# Patient Record
Sex: Male | Born: 1945 | Race: White | Hispanic: No | Marital: Married | Smoking: Never smoker
Health system: Southern US, Community
[De-identification: ages and names within clinical notes are randomized; demographics above are authoritative.]

## PROBLEM LIST (undated history)

## (undated) DIAGNOSIS — Z85828 Personal history of other malignant neoplasm of skin: Secondary | ICD-10-CM

## (undated) DIAGNOSIS — G4730 Sleep apnea, unspecified: Secondary | ICD-10-CM

## (undated) DIAGNOSIS — I82409 Acute embolism and thrombosis of unspecified deep veins of unspecified lower extremity: Secondary | ICD-10-CM

## (undated) DIAGNOSIS — E119 Type 2 diabetes mellitus without complications: Secondary | ICD-10-CM

## (undated) DIAGNOSIS — D689 Coagulation defect, unspecified: Secondary | ICD-10-CM

## (undated) DIAGNOSIS — N189 Chronic kidney disease, unspecified: Secondary | ICD-10-CM

## (undated) DIAGNOSIS — M1990 Unspecified osteoarthritis, unspecified site: Secondary | ICD-10-CM

## (undated) DIAGNOSIS — R0609 Other forms of dyspnea: Secondary | ICD-10-CM

## (undated) DIAGNOSIS — R0600 Dyspnea, unspecified: Secondary | ICD-10-CM

## (undated) DIAGNOSIS — N401 Enlarged prostate with lower urinary tract symptoms: Secondary | ICD-10-CM

## (undated) DIAGNOSIS — Z9889 Other specified postprocedural states: Secondary | ICD-10-CM

## (undated) DIAGNOSIS — Z973 Presence of spectacles and contact lenses: Secondary | ICD-10-CM

## (undated) DIAGNOSIS — Z86718 Personal history of other venous thrombosis and embolism: Secondary | ICD-10-CM

## (undated) DIAGNOSIS — I2510 Atherosclerotic heart disease of native coronary artery without angina pectoris: Secondary | ICD-10-CM

## (undated) DIAGNOSIS — I639 Cerebral infarction, unspecified: Secondary | ICD-10-CM

## (undated) DIAGNOSIS — S0081XA Abrasion of other part of head, initial encounter: Secondary | ICD-10-CM

## (undated) DIAGNOSIS — G629 Polyneuropathy, unspecified: Secondary | ICD-10-CM

## (undated) DIAGNOSIS — E785 Hyperlipidemia, unspecified: Secondary | ICD-10-CM

## (undated) DIAGNOSIS — L298 Other pruritus: Secondary | ICD-10-CM

## (undated) DIAGNOSIS — Z87442 Personal history of urinary calculi: Secondary | ICD-10-CM

## (undated) DIAGNOSIS — E7800 Pure hypercholesterolemia, unspecified: Secondary | ICD-10-CM

## (undated) DIAGNOSIS — M1A9XX0 Chronic gout, unspecified, without tophus (tophi): Secondary | ICD-10-CM

## (undated) DIAGNOSIS — K5730 Diverticulosis of large intestine without perforation or abscess without bleeding: Secondary | ICD-10-CM

## (undated) DIAGNOSIS — I10 Essential (primary) hypertension: Secondary | ICD-10-CM

## (undated) DIAGNOSIS — N289 Disorder of kidney and ureter, unspecified: Secondary | ICD-10-CM

## (undated) DIAGNOSIS — T603X1A Toxic effect of herbicides and fungicides, accidental (unintentional), initial encounter: Secondary | ICD-10-CM

## (undated) DIAGNOSIS — K5790 Diverticulosis of intestine, part unspecified, without perforation or abscess without bleeding: Secondary | ICD-10-CM

## (undated) DIAGNOSIS — K5909 Other constipation: Secondary | ICD-10-CM

## (undated) DIAGNOSIS — D631 Anemia in chronic kidney disease: Secondary | ICD-10-CM

## (undated) DIAGNOSIS — Z794 Long term (current) use of insulin: Secondary | ICD-10-CM

## (undated) DIAGNOSIS — C61 Malignant neoplasm of prostate: Secondary | ICD-10-CM

## (undated) DIAGNOSIS — C4491 Basal cell carcinoma of skin, unspecified: Secondary | ICD-10-CM

## (undated) DIAGNOSIS — F419 Anxiety disorder, unspecified: Secondary | ICD-10-CM

## (undated) DIAGNOSIS — H269 Unspecified cataract: Secondary | ICD-10-CM

## (undated) DIAGNOSIS — Z974 Presence of external hearing-aid: Secondary | ICD-10-CM

## (undated) DIAGNOSIS — N200 Calculus of kidney: Secondary | ICD-10-CM

## (undated) DIAGNOSIS — N183 Chronic kidney disease, stage 3 unspecified: Secondary | ICD-10-CM

## (undated) DIAGNOSIS — L2989 Other pruritus: Secondary | ICD-10-CM

## (undated) DIAGNOSIS — G4733 Obstructive sleep apnea (adult) (pediatric): Secondary | ICD-10-CM

## (undated) DIAGNOSIS — Z8673 Personal history of transient ischemic attack (TIA), and cerebral infarction without residual deficits: Secondary | ICD-10-CM

## (undated) DIAGNOSIS — Z77098 Contact with and (suspected) exposure to other hazardous, chiefly nonmedicinal, chemicals: Secondary | ICD-10-CM

## (undated) DIAGNOSIS — D649 Anemia, unspecified: Secondary | ICD-10-CM

## (undated) HISTORY — PX: APPENDECTOMY: SHX54

## (undated) HISTORY — DX: Personal history of other malignant neoplasm of skin: Z85.828

## (undated) HISTORY — DX: Chronic kidney disease, unspecified: N18.9

## (undated) HISTORY — DX: Pure hypercholesterolemia, unspecified: E78.00

## (undated) HISTORY — DX: Unspecified cataract: H26.9

## (undated) HISTORY — DX: Diverticulosis of intestine, part unspecified, without perforation or abscess without bleeding: K57.90

## (undated) HISTORY — DX: Anemia, unspecified: D64.9

## (undated) HISTORY — DX: Basal cell carcinoma of skin, unspecified: C44.91

## (undated) HISTORY — DX: Atherosclerotic heart disease of native coronary artery without angina pectoris: I25.10

## (undated) HISTORY — DX: Cerebral infarction, unspecified: I63.9

## (undated) HISTORY — DX: Sleep apnea, unspecified: G47.30

## (undated) HISTORY — DX: Unspecified osteoarthritis, unspecified site: M19.90

## (undated) HISTORY — PX: OTHER SURGICAL HISTORY: SHX169

## (undated) HISTORY — PX: TOTAL HIP ARTHROPLASTY: SHX124

## (undated) HISTORY — PX: MOHS SURGERY: SUR867

## (undated) HISTORY — DX: Calculus of kidney: N20.0

## (undated) HISTORY — DX: Anxiety disorder, unspecified: F41.9

## (undated) HISTORY — DX: Coagulation defect, unspecified: D68.9

## (undated) HISTORY — DX: Acute embolism and thrombosis of unspecified deep veins of unspecified lower extremity: I82.409

## (undated) HISTORY — DX: Disorder of kidney and ureter, unspecified: N28.9

## (undated) HISTORY — DX: Hyperlipidemia, unspecified: E78.5

## (undated) HISTORY — DX: Essential (primary) hypertension: I10

---

## 2000-11-19 HISTORY — PX: CYSTOSCOPY/RETROGRADE/URETEROSCOPY/STONE EXTRACTION WITH BASKET: SHX5317

## 2003-06-24 ENCOUNTER — Encounter: Payer: Self-pay | Admitting: Family Medicine

## 2003-06-24 ENCOUNTER — Encounter: Admission: RE | Admit: 2003-06-24 | Discharge: 2003-06-24 | Payer: Self-pay | Admitting: Family Medicine

## 2003-08-31 ENCOUNTER — Ambulatory Visit (HOSPITAL_COMMUNITY): Admission: RE | Admit: 2003-08-31 | Discharge: 2003-08-31 | Payer: Self-pay | Admitting: Gastroenterology

## 2003-10-26 ENCOUNTER — Inpatient Hospital Stay (HOSPITAL_COMMUNITY): Admission: RE | Admit: 2003-10-26 | Discharge: 2003-10-30 | Payer: Self-pay | Admitting: Orthopedic Surgery

## 2003-11-04 ENCOUNTER — Ambulatory Visit (HOSPITAL_COMMUNITY): Admission: RE | Admit: 2003-11-04 | Discharge: 2003-11-04 | Payer: Self-pay | Admitting: Orthopedic Surgery

## 2004-08-11 ENCOUNTER — Ambulatory Visit (HOSPITAL_COMMUNITY): Admission: RE | Admit: 2004-08-11 | Discharge: 2004-08-11 | Payer: Self-pay | Admitting: Oncology

## 2004-08-11 ENCOUNTER — Encounter (INDEPENDENT_AMBULATORY_CARE_PROVIDER_SITE_OTHER): Payer: Self-pay | Admitting: Specialist

## 2004-08-18 ENCOUNTER — Ambulatory Visit (HOSPITAL_COMMUNITY): Admission: RE | Admit: 2004-08-18 | Discharge: 2004-08-18 | Payer: Self-pay | Admitting: Oncology

## 2004-09-06 ENCOUNTER — Ambulatory Visit (HOSPITAL_COMMUNITY): Admission: RE | Admit: 2004-09-06 | Discharge: 2004-09-06 | Payer: Self-pay | Admitting: Oncology

## 2004-09-29 ENCOUNTER — Ambulatory Visit: Payer: Self-pay | Admitting: Oncology

## 2004-11-14 ENCOUNTER — Ambulatory Visit: Payer: Self-pay | Admitting: Oncology

## 2005-01-21 ENCOUNTER — Ambulatory Visit: Payer: Self-pay | Admitting: Oncology

## 2005-04-20 ENCOUNTER — Ambulatory Visit: Payer: Self-pay | Admitting: Oncology

## 2005-06-14 IMAGING — CT CT PELVIS W/ CM
1 of 3 series · 14 of 32 positions shown, 19 images · IV contrast (omnipaque)
Comparison: none

CLINICAL DATA: Anemia. 
 CT SCANS OF THE ABDOMEN AND PELVIS WITH INTRAVENOUS CONTRAST ? 09/06/04
 The patient received 150 cc of Omnipaque 300 intravenously.  There are some focal areas of pleural thickening posteriorly and bilaterally, more on the right than the left at the lung bases. Heart size is normal. 
 The liver, spleen, pancreas, adrenal glands and kidneys appear normal. There is no adenopathy.  Maximum diameter of the spleen is approximately 11.4 cm. this is normal.  No dilated bowel.  
 IMPRESSION 
 Normal CT scan of the abdomen. 
 CT SCAN OF THE PELVIS, WITH INTRAVENOUS CONTRAST
 The visualized bowel is normal including the terminal ileum.  There is no adenopathy.  The prostate gland is not enlarged.   There is some streak artifact caused by the right hip prosthesis. 
 No significant bony abnormality. 
 Negative CT scan of the pelvis.

[Series 2: abd/pelvis 5.0 b10f · axial · 0.85mm/px · z∈[-410,+20]mm · 14 of 96 slices shown, 19 images]
[im 5/96  soft-tissue]
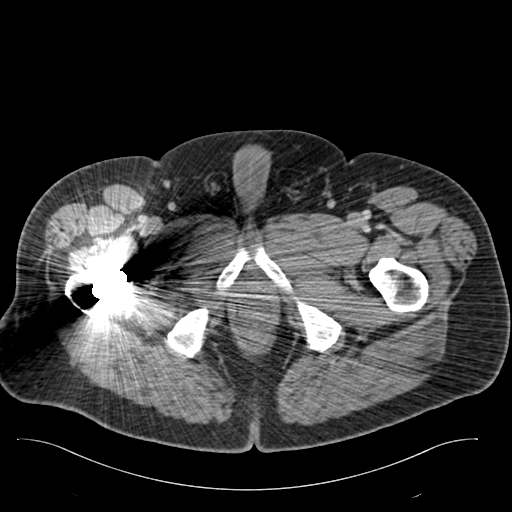
[im 5/96  bone]
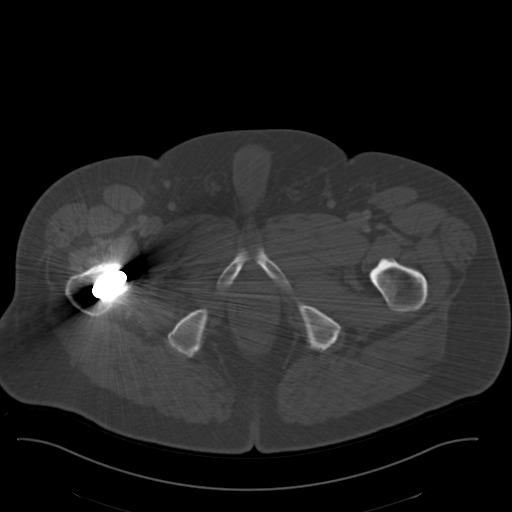
[im 15/96  soft-tissue]
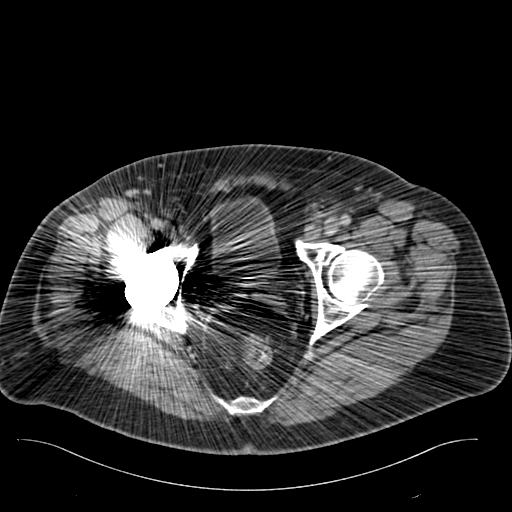
[im 20/96  soft-tissue]
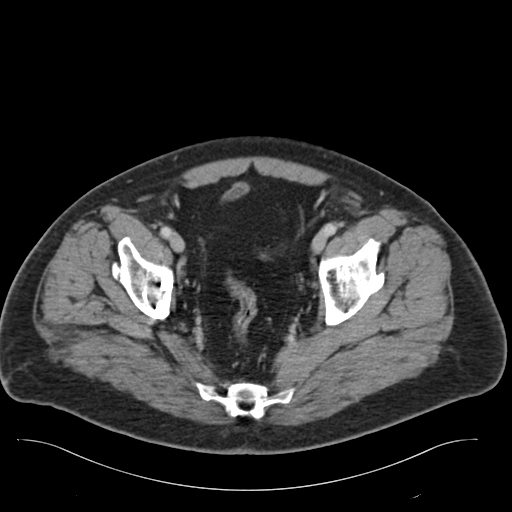
[im 29/96  soft-tissue]
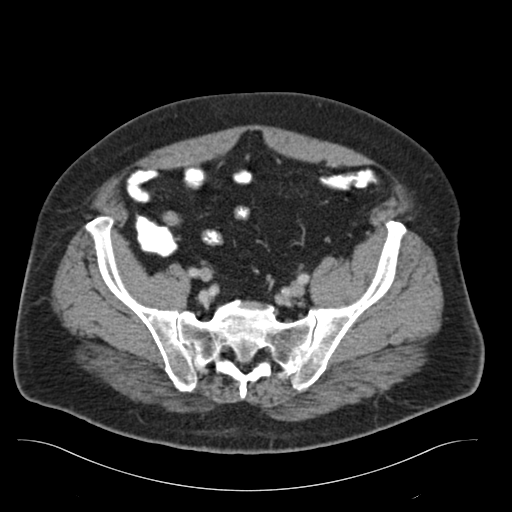
[im 34/96  soft-tissue]
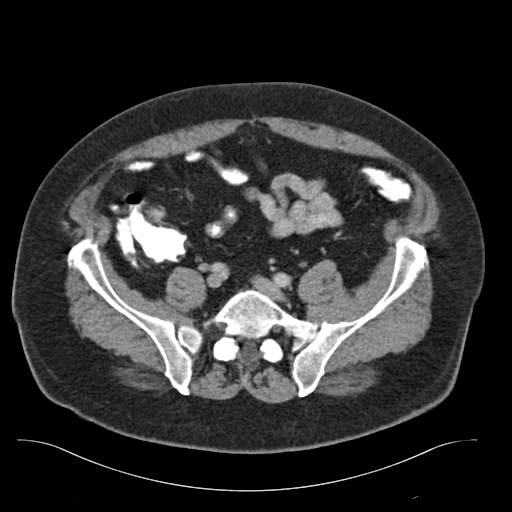
[im 43/96  soft-tissue]
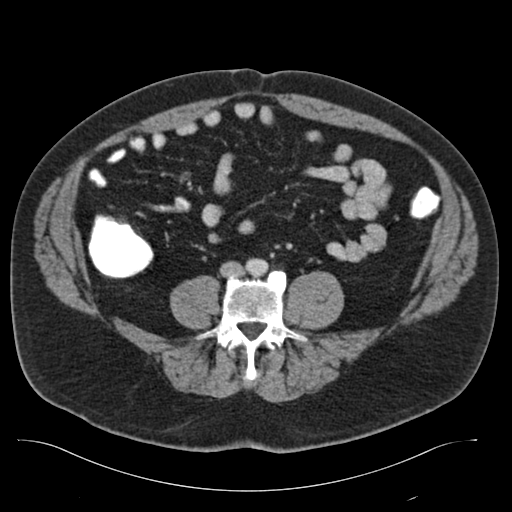
[im 48/96  soft-tissue]
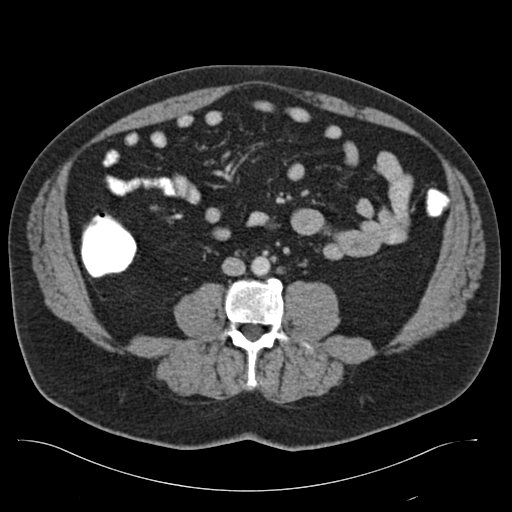
[im 53/96  soft-tissue]
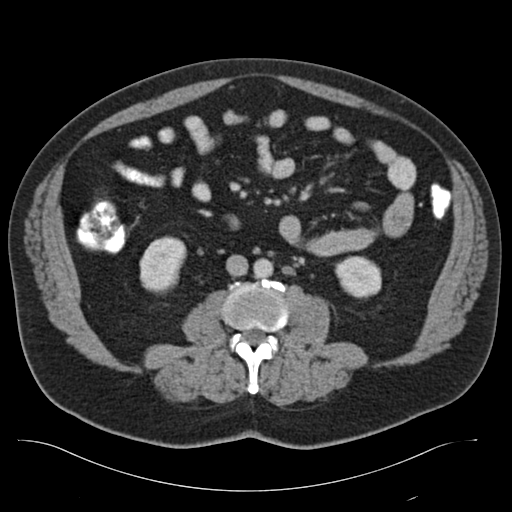
[im 62/96  soft-tissue]
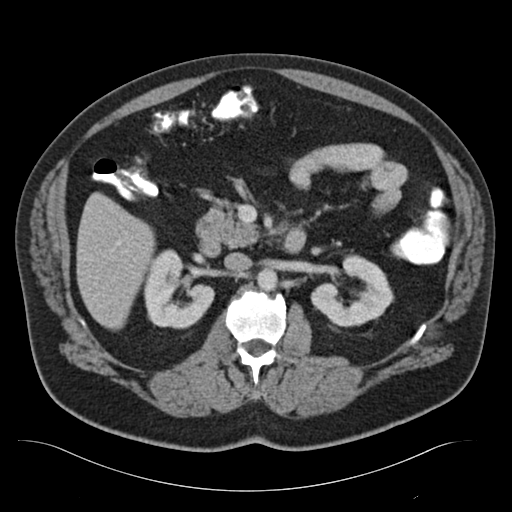
[im 62/96  bone]
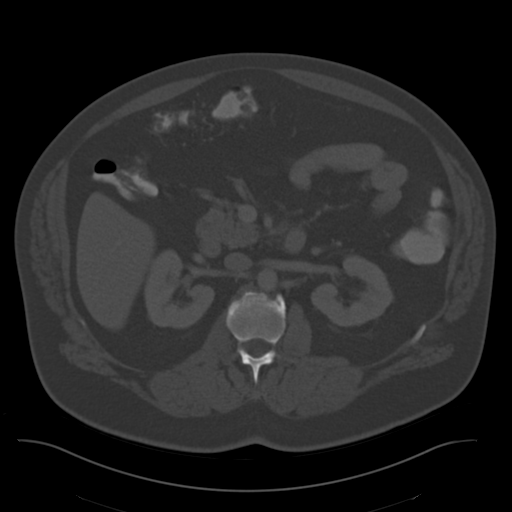
[im 67/96  soft-tissue]
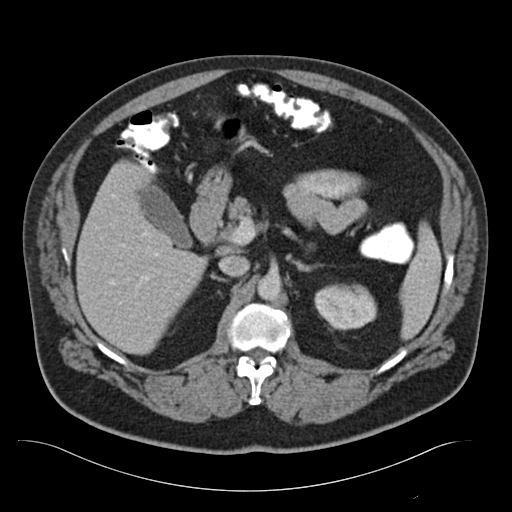
[im 77/96  soft-tissue]
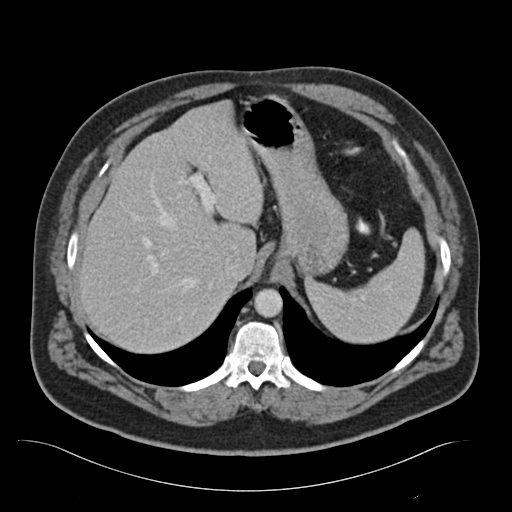
[im 77/96  lung]
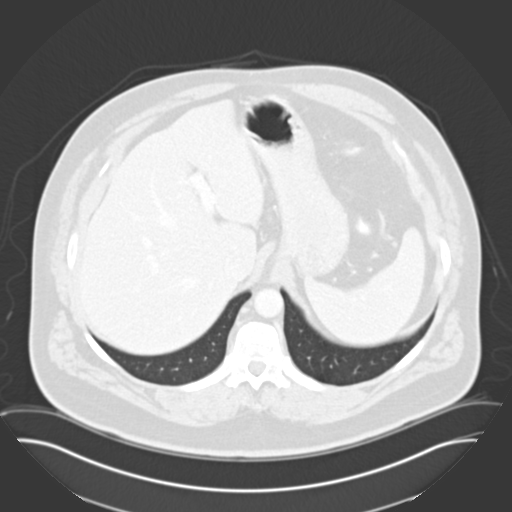
[im 81/96  soft-tissue]
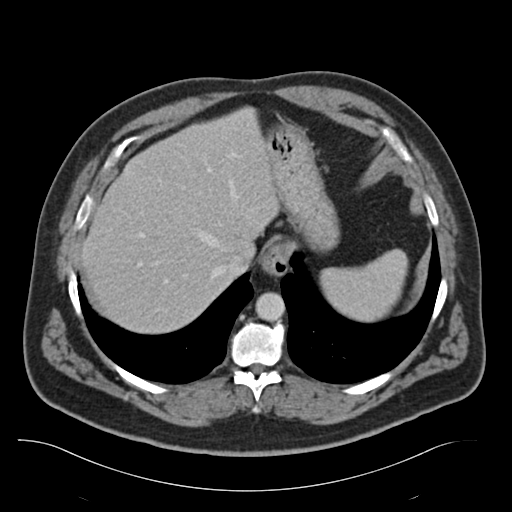
[im 81/96  lung]
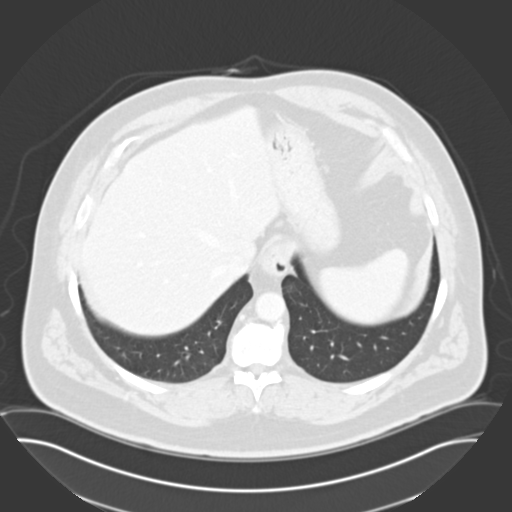
[im 86/96  lung]
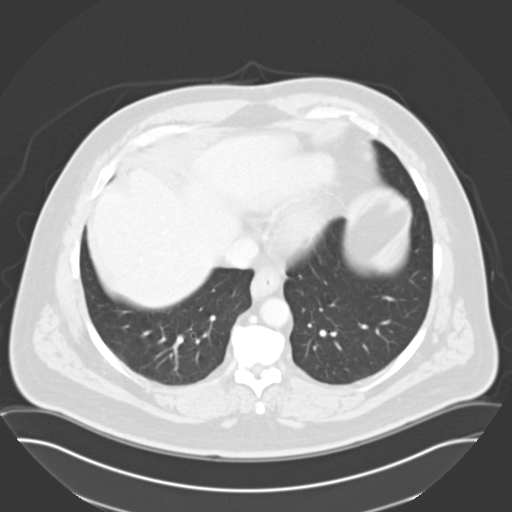
[im 91/96  soft-tissue]
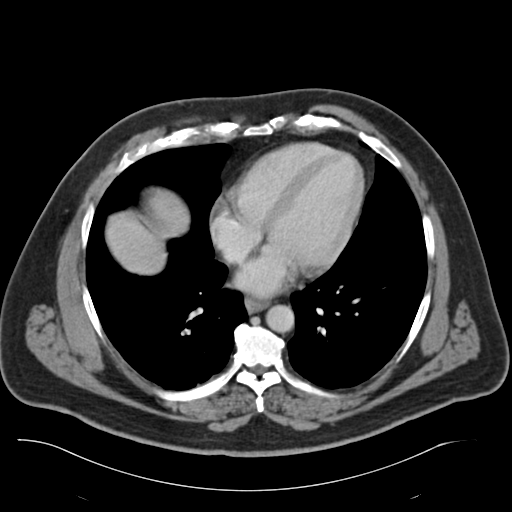
[im 91/96  lung]
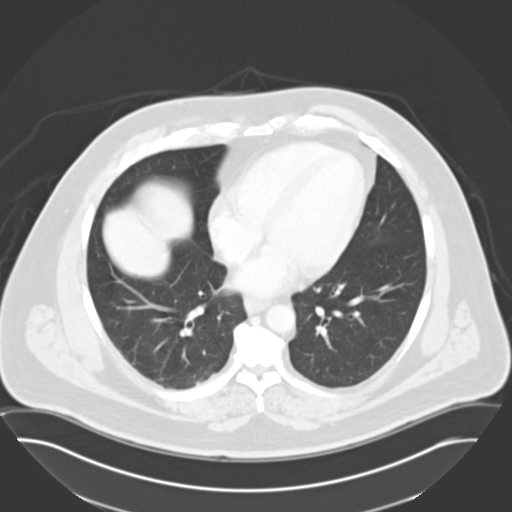

[14 of 32 positions shown; findings below may reference images not displayed]

## 2005-06-22 ENCOUNTER — Ambulatory Visit: Payer: Self-pay | Admitting: Oncology

## 2005-08-23 ENCOUNTER — Encounter: Admission: RE | Admit: 2005-08-23 | Discharge: 2005-08-23 | Payer: Self-pay | Admitting: Family Medicine

## 2005-08-24 ENCOUNTER — Ambulatory Visit: Payer: Self-pay | Admitting: Oncology

## 2005-08-30 ENCOUNTER — Ambulatory Visit (HOSPITAL_BASED_OUTPATIENT_CLINIC_OR_DEPARTMENT_OTHER): Admission: RE | Admit: 2005-08-30 | Discharge: 2005-08-30 | Payer: Self-pay | Admitting: Family Medicine

## 2005-09-02 ENCOUNTER — Ambulatory Visit: Payer: Self-pay | Admitting: Internal Medicine

## 2005-09-27 ENCOUNTER — Ambulatory Visit (HOSPITAL_BASED_OUTPATIENT_CLINIC_OR_DEPARTMENT_OTHER): Admission: RE | Admit: 2005-09-27 | Discharge: 2005-09-27 | Payer: Self-pay | Admitting: Family Medicine

## 2005-10-19 ENCOUNTER — Ambulatory Visit: Payer: Self-pay | Admitting: Oncology

## 2005-12-21 ENCOUNTER — Ambulatory Visit: Payer: Self-pay | Admitting: Oncology

## 2006-02-06 ENCOUNTER — Encounter: Admission: RE | Admit: 2006-02-06 | Discharge: 2006-02-06 | Payer: Self-pay | Admitting: Family Medicine

## 2006-02-26 ENCOUNTER — Ambulatory Visit: Payer: Self-pay | Admitting: Oncology

## 2006-02-26 LAB — COMPREHENSIVE METABOLIC PANEL
Albumin: 4.5 g/dL (ref 3.5–5.2)
Alkaline Phosphatase: 58 U/L (ref 39–117)
BUN: 21 mg/dL (ref 6–23)
CO2: 22 mEq/L (ref 19–32)
Calcium: 8.9 mg/dL (ref 8.4–10.5)
Chloride: 106 mEq/L (ref 96–112)
Creatinine, Ser: 1.3 mg/dL (ref 0.4–1.5)
Glucose, Bld: 147 mg/dL — ABNORMAL HIGH (ref 70–99)
Potassium: 4.2 mEq/L (ref 3.5–5.3)
Sodium: 138 mEq/L (ref 135–145)
Total Protein: 6.8 g/dL (ref 6.0–8.3)

## 2006-02-26 LAB — LACTATE DEHYDROGENASE: LDH: 179 U/L (ref 94–250)

## 2006-02-26 LAB — CBC WITH DIFFERENTIAL/PLATELET
Eosinophils Absolute: 0.2 10*3/uL (ref 0.0–0.5)
LYMPH%: 36.5 % (ref 14.0–48.0)
MONO#: 0.4 10*3/uL (ref 0.1–0.9)
NEUT#: 3.4 10*3/uL (ref 1.5–6.5)
Platelets: 221 10*3/uL (ref 145–400)
RBC: 3.96 10*6/uL — ABNORMAL LOW (ref 4.20–5.71)
RDW: 15 % — ABNORMAL HIGH (ref 11.2–14.6)
WBC: 6.5 10*3/uL (ref 4.0–10.0)

## 2006-02-26 LAB — FERRITIN: Ferritin: 76 ng/mL (ref 22–322)

## 2006-02-26 LAB — IRON AND TIBC
Iron: 65 ug/dL (ref 42–165)
UIBC: 331 ug/dL

## 2006-04-02 LAB — COMPREHENSIVE METABOLIC PANEL
CO2: 21 mEq/L (ref 19–32)
Glucose, Bld: 162 mg/dL — ABNORMAL HIGH (ref 70–99)
Sodium: 140 mEq/L (ref 135–145)
Total Bilirubin: 0.4 mg/dL (ref 0.3–1.2)
Total Protein: 6.7 g/dL (ref 6.0–8.3)

## 2006-04-02 LAB — CBC WITH DIFFERENTIAL/PLATELET
Eosinophils Absolute: 0.1 10*3/uL (ref 0.0–0.5)
HCT: 33.6 % — ABNORMAL LOW (ref 38.7–49.9)
LYMPH%: 27.3 % (ref 14.0–48.0)
MONO#: 0.7 10*3/uL (ref 0.1–0.9)
NEUT#: 5.7 10*3/uL (ref 1.5–6.5)
NEUT%: 63 % (ref 40.0–75.0)
Platelets: 229 10*3/uL (ref 145–400)
WBC: 9.1 10*3/uL (ref 4.0–10.0)

## 2006-04-02 LAB — IRON AND TIBC
TIBC: 330 ug/dL (ref 215–435)
UIBC: 237 ug/dL

## 2006-04-26 ENCOUNTER — Ambulatory Visit: Payer: Self-pay | Admitting: Oncology

## 2006-04-30 LAB — CBC WITH DIFFERENTIAL/PLATELET
Eosinophils Absolute: 0 10*3/uL (ref 0.0–0.5)
HGB: 11.9 g/dL — ABNORMAL LOW (ref 13.0–17.1)
LYMPH%: 13.4 % — ABNORMAL LOW (ref 14.0–48.0)
MONO#: 0.6 10*3/uL (ref 0.1–0.9)
NEUT#: 7.7 10*3/uL — ABNORMAL HIGH (ref 1.5–6.5)
Platelets: 249 10*3/uL (ref 145–400)
RBC: 4.18 10*6/uL — ABNORMAL LOW (ref 4.20–5.71)
RDW: 15.3 % — ABNORMAL HIGH (ref 11.2–14.6)
WBC: 9.6 10*3/uL (ref 4.0–10.0)

## 2006-05-28 LAB — CBC WITH DIFFERENTIAL/PLATELET
Eosinophils Absolute: 0.7 10*3/uL — ABNORMAL HIGH (ref 0.0–0.5)
MCV: 84.9 fL (ref 81.6–98.0)
MONO%: 8 % (ref 0.0–13.0)
NEUT#: 2.5 10*3/uL (ref 1.5–6.5)
RBC: 4.4 10*6/uL (ref 4.20–5.71)
RDW: 15.3 % — ABNORMAL HIGH (ref 11.2–14.6)
WBC: 6.1 10*3/uL (ref 4.0–10.0)
lymph#: 2.4 10*3/uL (ref 0.9–3.3)

## 2006-07-04 ENCOUNTER — Ambulatory Visit: Payer: Self-pay | Admitting: Oncology

## 2006-07-09 LAB — IRON AND TIBC
%SAT: 23 % (ref 20–55)
Iron: 81 ug/dL (ref 42–165)
TIBC: 345 ug/dL (ref 215–435)
UIBC: 264 ug/dL

## 2006-07-09 LAB — COMPREHENSIVE METABOLIC PANEL
ALT: 56 U/L — ABNORMAL HIGH (ref 0–40)
Albumin: 4.3 g/dL (ref 3.5–5.2)
Alkaline Phosphatase: 76 U/L (ref 39–117)
Potassium: 4.1 mEq/L (ref 3.5–5.3)
Sodium: 141 mEq/L (ref 135–145)
Total Bilirubin: 0.6 mg/dL (ref 0.3–1.2)
Total Protein: 6.8 g/dL (ref 6.0–8.3)

## 2006-07-09 LAB — CBC WITH DIFFERENTIAL/PLATELET
BASO%: 0.2 % (ref 0.0–2.0)
LYMPH%: 39 % (ref 14.0–48.0)
MCHC: 34.4 g/dL (ref 32.0–35.9)
MONO#: 0.5 10*3/uL (ref 0.1–0.9)
MONO%: 7.6 % (ref 0.0–13.0)
NEUT#: 2.7 10*3/uL (ref 1.5–6.5)
Platelets: 239 10*3/uL (ref 145–400)
RBC: 3.84 10*6/uL — ABNORMAL LOW (ref 4.20–5.71)
RDW: 15.1 % — ABNORMAL HIGH (ref 11.2–14.6)
WBC: 6 10*3/uL (ref 4.0–10.0)

## 2006-08-06 LAB — CBC WITH DIFFERENTIAL/PLATELET
BASO%: 0.5 % (ref 0.0–2.0)
Basophils Absolute: 0 10*3/uL (ref 0.0–0.1)
EOS%: 10.1 % — ABNORMAL HIGH (ref 0.0–7.0)
MCH: 29.4 pg (ref 28.0–33.4)
MCHC: 34.5 g/dL (ref 32.0–35.9)
MCV: 85.2 fL (ref 81.6–98.0)
MONO%: 7.8 % (ref 0.0–13.0)
RBC: 4.1 10*6/uL — ABNORMAL LOW (ref 4.20–5.71)
RDW: 14.3 % (ref 11.2–14.6)
lymph#: 2.6 10*3/uL (ref 0.9–3.3)

## 2006-08-30 ENCOUNTER — Ambulatory Visit: Payer: Self-pay | Admitting: Oncology

## 2006-09-03 LAB — CBC WITH DIFFERENTIAL/PLATELET
EOS%: 9.2 % — ABNORMAL HIGH (ref 0.0–7.0)
Eosinophils Absolute: 0.6 10*3/uL — ABNORMAL HIGH (ref 0.0–0.5)
LYMPH%: 39.2 % (ref 14.0–48.0)
MCH: 28.8 pg (ref 28.0–33.4)
MCHC: 34.4 g/dL (ref 32.0–35.9)
MCV: 83.8 fL (ref 81.6–98.0)
MONO%: 7.1 % (ref 0.0–13.0)
NEUT#: 3.1 10*3/uL (ref 1.5–6.5)
Platelets: 233 10*3/uL (ref 145–400)
RBC: 4.38 10*6/uL (ref 4.20–5.71)
RDW: 12.3 % (ref 11.2–14.6)

## 2006-10-01 LAB — CBC WITH DIFFERENTIAL/PLATELET
Basophils Absolute: 0 10*3/uL (ref 0.0–0.1)
Eosinophils Absolute: 0.6 10*3/uL — ABNORMAL HIGH (ref 0.0–0.5)
HGB: 12.5 g/dL — ABNORMAL LOW (ref 13.0–17.1)
MCV: 84.8 fL (ref 81.6–98.0)
MONO#: 0.7 10*3/uL (ref 0.1–0.9)
MONO%: 8.3 % (ref 0.0–13.0)
NEUT#: 3.8 10*3/uL (ref 1.5–6.5)
Platelets: 232 10*3/uL (ref 145–400)
RDW: 13.6 % (ref 11.2–14.6)

## 2006-10-08 LAB — COMPREHENSIVE METABOLIC PANEL
Alkaline Phosphatase: 78 U/L (ref 39–117)
BUN: 23 mg/dL (ref 6–23)
CO2: 23 mEq/L (ref 19–32)
Creatinine, Ser: 1.28 mg/dL (ref 0.40–1.50)
Glucose, Bld: 240 mg/dL — ABNORMAL HIGH (ref 70–99)
Sodium: 140 mEq/L (ref 135–145)
Total Bilirubin: 0.4 mg/dL (ref 0.3–1.2)

## 2006-10-08 LAB — CBC WITH DIFFERENTIAL/PLATELET
Basophils Absolute: 0 10*3/uL (ref 0.0–0.1)
Eosinophils Absolute: 0.6 10*3/uL — ABNORMAL HIGH (ref 0.0–0.5)
HCT: 35.2 % — ABNORMAL LOW (ref 38.7–49.9)
LYMPH%: 37.8 % (ref 14.0–48.0)
MCV: 84.4 fL (ref 81.6–98.0)
MONO%: 6.3 % (ref 0.0–13.0)
NEUT#: 3 10*3/uL (ref 1.5–6.5)
NEUT%: 46.8 % (ref 40.0–75.0)
Platelets: 214 10*3/uL (ref 145–400)
RBC: 4.17 10*6/uL — ABNORMAL LOW (ref 4.20–5.71)

## 2006-10-08 LAB — IRON AND TIBC
%SAT: 27 % (ref 20–55)
Iron: 85 ug/dL (ref 42–165)
TIBC: 310 ug/dL (ref 215–435)
UIBC: 225 ug/dL

## 2006-10-08 LAB — LACTATE DEHYDROGENASE: LDH: 144 U/L (ref 94–250)

## 2006-10-25 ENCOUNTER — Ambulatory Visit: Payer: Self-pay | Admitting: Oncology

## 2006-10-29 LAB — CBC WITH DIFFERENTIAL/PLATELET
Basophils Absolute: 0.1 10*3/uL (ref 0.0–0.1)
EOS%: 8.7 % — ABNORMAL HIGH (ref 0.0–7.0)
Eosinophils Absolute: 0.6 10*3/uL — ABNORMAL HIGH (ref 0.0–0.5)
HGB: 13 g/dL (ref 13.0–17.1)
LYMPH%: 38.7 % (ref 14.0–48.0)
MCH: 28.2 pg (ref 28.0–33.4)
MCV: 84.6 fL (ref 81.6–98.0)
MONO%: 7.9 % (ref 0.0–13.0)
NEUT#: 3 10*3/uL (ref 1.5–6.5)
Platelets: 242 10*3/uL (ref 145–400)
RBC: 4.6 10*6/uL (ref 4.20–5.71)

## 2006-11-26 LAB — CBC WITH DIFFERENTIAL/PLATELET
BASO%: 0.3 % (ref 0.0–2.0)
Basophils Absolute: 0 10*3/uL (ref 0.0–0.1)
EOS%: 7.1 % — ABNORMAL HIGH (ref 0.0–7.0)
HCT: 35.5 % — ABNORMAL LOW (ref 38.7–49.9)
HGB: 11.9 g/dL — ABNORMAL LOW (ref 13.0–17.1)
LYMPH%: 30.5 % (ref 14.0–48.0)
MCH: 27.9 pg — ABNORMAL LOW (ref 28.0–33.4)
MCHC: 33.6 g/dL (ref 32.0–35.9)
MCV: 83 fL (ref 81.6–98.0)
NEUT%: 55.8 % (ref 40.0–75.0)
Platelets: 252 10*3/uL (ref 145–400)
lymph#: 3.3 10*3/uL (ref 0.9–3.3)

## 2006-12-19 ENCOUNTER — Ambulatory Visit: Payer: Self-pay | Admitting: Oncology

## 2006-12-24 LAB — CBC WITH DIFFERENTIAL/PLATELET
BASO%: 0.5 % (ref 0.0–2.0)
Basophils Absolute: 0 10*3/uL (ref 0.0–0.1)
EOS%: 4.1 % (ref 0.0–7.0)
HGB: 11.7 g/dL — ABNORMAL LOW (ref 13.0–17.1)
MCH: 28.5 pg (ref 28.0–33.4)
MCHC: 35.1 g/dL (ref 32.0–35.9)
MCV: 81.3 fL — ABNORMAL LOW (ref 81.6–98.0)
MONO%: 7.1 % (ref 0.0–13.0)
NEUT%: 50.5 % (ref 40.0–75.0)
RDW: 14.5 % (ref 11.2–14.6)

## 2007-01-21 LAB — CBC WITH DIFFERENTIAL/PLATELET
BASO%: 0.4 % (ref 0.0–2.0)
Basophils Absolute: 0 10e3/uL (ref 0.0–0.1)
EOS%: 5.4 % (ref 0.0–7.0)
Eosinophils Absolute: 0.3 10e3/uL (ref 0.0–0.5)
HCT: 34.9 % — ABNORMAL LOW (ref 38.7–49.9)
HGB: 12.1 g/dL — ABNORMAL LOW (ref 13.0–17.1)
LYMPH%: 35.7 % (ref 14.0–48.0)
MCH: 27.8 pg — ABNORMAL LOW (ref 28.0–33.4)
MCHC: 34.6 g/dL (ref 32.0–35.9)
MCV: 80.3 fL — ABNORMAL LOW (ref 81.6–98.0)
MONO#: 0.5 10e3/uL (ref 0.1–0.9)
MONO%: 7.1 % (ref 0.0–13.0)
NEUT#: 3.3 10e3/uL (ref 1.5–6.5)
NEUT%: 51.4 % (ref 40.0–75.0)
Platelets: 244 10e3/uL (ref 145–400)
RBC: 4.35 10e6/uL (ref 4.20–5.71)
RDW: 14.6 % (ref 11.2–14.6)
WBC: 6.4 10e3/uL (ref 4.0–10.0)
lymph#: 2.3 10e3/uL (ref 0.9–3.3)

## 2007-01-21 LAB — COMPREHENSIVE METABOLIC PANEL WITH GFR
ALT: 23 U/L (ref 0–53)
AST: 19 U/L (ref 0–37)
Albumin: 4.1 g/dL (ref 3.5–5.2)
Alkaline Phosphatase: 87 U/L (ref 39–117)
BUN: 17 mg/dL (ref 6–23)
CO2: 21 meq/L (ref 19–32)
Calcium: 8.6 mg/dL (ref 8.4–10.5)
Chloride: 108 meq/L (ref 96–112)
Creatinine, Ser: 1.15 mg/dL (ref 0.40–1.50)
Glucose, Bld: 189 mg/dL — ABNORMAL HIGH (ref 70–99)
Potassium: 4.1 meq/L (ref 3.5–5.3)
Sodium: 141 meq/L (ref 135–145)
Total Bilirubin: 0.5 mg/dL (ref 0.3–1.2)
Total Protein: 6.6 g/dL (ref 6.0–8.3)

## 2007-01-21 LAB — IRON AND TIBC
%SAT: 23 % (ref 20–55)
Iron: 66 ug/dL (ref 42–165)
TIBC: 286 ug/dL (ref 215–435)
UIBC: 220 ug/dL

## 2007-01-21 LAB — FERRITIN: Ferritin: 320 ng/mL (ref 22–322)

## 2007-01-21 LAB — LACTATE DEHYDROGENASE: LDH: 128 U/L (ref 94–250)

## 2007-02-14 ENCOUNTER — Ambulatory Visit: Payer: Self-pay | Admitting: Oncology

## 2007-02-18 LAB — CBC WITH DIFFERENTIAL/PLATELET
Basophils Absolute: 0 10*3/uL (ref 0.0–0.1)
EOS%: 6.7 % (ref 0.0–7.0)
Eosinophils Absolute: 0.5 10*3/uL (ref 0.0–0.5)
HCT: 37.4 % — ABNORMAL LOW (ref 38.7–49.9)
HGB: 12.8 g/dL — ABNORMAL LOW (ref 13.0–17.1)
MCH: 27.5 pg — ABNORMAL LOW (ref 28.0–33.4)
NEUT#: 3.8 10*3/uL (ref 1.5–6.5)
NEUT%: 53.9 % (ref 40.0–75.0)
RDW: 15.1 % — ABNORMAL HIGH (ref 11.2–14.6)
lymph#: 2.3 10*3/uL (ref 0.9–3.3)

## 2007-03-18 LAB — CBC WITH DIFFERENTIAL/PLATELET
BASO%: 0.1 % (ref 0.0–2.0)
LYMPH%: 32.7 % (ref 14.0–48.0)
MCH: 27.8 pg — ABNORMAL LOW (ref 28.0–33.4)
MCHC: 35 g/dL (ref 32.0–35.9)
MCV: 79.3 fL — ABNORMAL LOW (ref 81.6–98.0)
MONO%: 6 % (ref 0.0–13.0)
Platelets: 206 10*3/uL (ref 145–400)
RBC: 4.52 10*6/uL (ref 4.20–5.71)

## 2007-04-10 ENCOUNTER — Ambulatory Visit: Payer: Self-pay | Admitting: Oncology

## 2007-04-15 LAB — CBC WITH DIFFERENTIAL/PLATELET
BASO%: 0.3 % (ref 0.0–2.0)
LYMPH%: 33.8 % (ref 14.0–48.0)
MCHC: 34.9 g/dL (ref 32.0–35.9)
MONO#: 0.5 10*3/uL (ref 0.1–0.9)
Platelets: 194 10*3/uL (ref 145–400)
RBC: 4.09 10*6/uL — ABNORMAL LOW (ref 4.20–5.71)
WBC: 8 10*3/uL (ref 4.0–10.0)
lymph#: 2.7 10*3/uL (ref 0.9–3.3)

## 2007-05-13 LAB — CBC WITH DIFFERENTIAL/PLATELET
BASO%: 0.3 % (ref 0.0–2.0)
HCT: 35.1 % — ABNORMAL LOW (ref 38.7–49.9)
MCHC: 34.8 g/dL (ref 32.0–35.9)
MONO#: 0.5 10*3/uL (ref 0.1–0.9)
NEUT%: 46 % (ref 40.0–75.0)
RDW: 15.5 % — ABNORMAL HIGH (ref 11.2–14.6)
WBC: 6.7 10*3/uL (ref 4.0–10.0)
lymph#: 2.7 10*3/uL (ref 0.9–3.3)

## 2007-07-04 ENCOUNTER — Ambulatory Visit: Payer: Self-pay | Admitting: Oncology

## 2007-08-07 LAB — CBC WITH DIFFERENTIAL/PLATELET
Basophils Absolute: 0 10*3/uL (ref 0.0–0.1)
Eosinophils Absolute: 0.6 10*3/uL — ABNORMAL HIGH (ref 0.0–0.5)
HCT: 34 % — ABNORMAL LOW (ref 38.7–49.9)
HGB: 12 g/dL — ABNORMAL LOW (ref 13.0–17.1)
MONO#: 0.6 10*3/uL (ref 0.1–0.9)
NEUT#: 3.7 10*3/uL (ref 1.5–6.5)
NEUT%: 46.7 % (ref 40.0–75.0)
WBC: 7.9 10*3/uL (ref 4.0–10.0)
lymph#: 3 10*3/uL (ref 0.9–3.3)

## 2007-08-29 ENCOUNTER — Ambulatory Visit: Payer: Self-pay | Admitting: Oncology

## 2007-09-02 LAB — CBC WITH DIFFERENTIAL/PLATELET
Basophils Absolute: 0 10*3/uL (ref 0.0–0.1)
EOS%: 9 % — ABNORMAL HIGH (ref 0.0–7.0)
Eosinophils Absolute: 0.7 10*3/uL — ABNORMAL HIGH (ref 0.0–0.5)
HCT: 35 % — ABNORMAL LOW (ref 38.7–49.9)
HGB: 12.2 g/dL — ABNORMAL LOW (ref 13.0–17.1)
MCH: 28.9 pg (ref 28.0–33.4)
MCV: 83.1 fL (ref 81.6–98.0)
MONO%: 7.4 % (ref 0.0–13.0)
NEUT#: 3.8 10*3/uL (ref 1.5–6.5)
NEUT%: 46.8 % (ref 40.0–75.0)

## 2007-09-30 LAB — CBC WITH DIFFERENTIAL/PLATELET
Basophils Absolute: 0.1 10*3/uL (ref 0.0–0.1)
EOS%: 10.1 % — ABNORMAL HIGH (ref 0.0–7.0)
Eosinophils Absolute: 0.7 10*3/uL — ABNORMAL HIGH (ref 0.0–0.5)
HCT: 36.4 % — ABNORMAL LOW (ref 38.7–49.9)
HGB: 12.5 g/dL — ABNORMAL LOW (ref 13.0–17.1)
LYMPH%: 39.2 % (ref 14.0–48.0)
MCH: 28.4 pg (ref 28.0–33.4)
MCV: 82.6 fL (ref 81.6–98.0)
MONO%: 6.5 % (ref 0.0–13.0)
NEUT%: 43.2 % (ref 40.0–75.0)
Platelets: 217 10*3/uL (ref 145–400)

## 2007-10-24 ENCOUNTER — Ambulatory Visit: Payer: Self-pay | Admitting: Oncology

## 2007-10-28 LAB — CBC WITH DIFFERENTIAL/PLATELET
EOS%: 9.5 % — ABNORMAL HIGH (ref 0.0–7.0)
LYMPH%: 34.9 % (ref 14.0–48.0)
MCH: 28.6 pg (ref 28.0–33.4)
MCV: 81.6 fL (ref 81.6–98.0)
MONO%: 6.9 % (ref 0.0–13.0)
Platelets: 207 10*3/uL (ref 145–400)
RBC: 4.16 10*6/uL — ABNORMAL LOW (ref 4.20–5.71)
RDW: 14.2 % (ref 11.2–14.6)

## 2007-11-25 LAB — FERRITIN: Ferritin: 248 ng/mL (ref 22–322)

## 2007-11-25 LAB — COMPREHENSIVE METABOLIC PANEL
ALT: 30 U/L (ref 0–53)
AST: 21 U/L (ref 0–37)
Albumin: 4.1 g/dL (ref 3.5–5.2)
Alkaline Phosphatase: 85 U/L (ref 39–117)
Glucose, Bld: 212 mg/dL — ABNORMAL HIGH (ref 70–99)
Potassium: 4.2 mEq/L (ref 3.5–5.3)
Sodium: 141 mEq/L (ref 135–145)
Total Bilirubin: 0.4 mg/dL (ref 0.3–1.2)
Total Protein: 6.6 g/dL (ref 6.0–8.3)

## 2007-11-25 LAB — CBC WITH DIFFERENTIAL/PLATELET
BASO%: 0.5 % (ref 0.0–2.0)
Eosinophils Absolute: 0.9 10*3/uL — ABNORMAL HIGH (ref 0.0–0.5)
LYMPH%: 33.4 % (ref 14.0–48.0)
MCHC: 34.3 g/dL (ref 32.0–35.9)
MCV: 81.2 fL — ABNORMAL LOW (ref 81.6–98.0)
MONO%: 6.7 % (ref 0.0–13.0)
NEUT#: 3.9 10*3/uL (ref 1.5–6.5)
Platelets: 228 10*3/uL (ref 145–400)
RBC: 4.39 10*6/uL (ref 4.20–5.71)
RDW: 14.3 % (ref 11.2–14.6)
WBC: 8 10*3/uL (ref 4.0–10.0)

## 2007-11-25 LAB — IRON AND TIBC: %SAT: 22 % (ref 20–55)

## 2007-12-19 ENCOUNTER — Ambulatory Visit: Payer: Self-pay | Admitting: Oncology

## 2007-12-23 LAB — CBC WITH DIFFERENTIAL/PLATELET
Basophils Absolute: 0.1 10*3/uL (ref 0.0–0.1)
EOS%: 11.5 % — ABNORMAL HIGH (ref 0.0–7.0)
Eosinophils Absolute: 1 10*3/uL — ABNORMAL HIGH (ref 0.0–0.5)
HCT: 34.4 % — ABNORMAL LOW (ref 38.7–49.9)
HGB: 11.7 g/dL — ABNORMAL LOW (ref 13.0–17.1)
MCH: 27.5 pg — ABNORMAL LOW (ref 28.0–33.4)
MCV: 81.1 fL — ABNORMAL LOW (ref 81.6–98.0)
MONO%: 8 % (ref 0.0–13.0)
NEUT#: 3.6 10*3/uL (ref 1.5–6.5)
NEUT%: 43.4 % (ref 40.0–75.0)
RDW: 11.3 % (ref 11.2–14.6)

## 2008-01-20 LAB — CBC WITH DIFFERENTIAL/PLATELET
Basophils Absolute: 0 10*3/uL (ref 0.0–0.1)
EOS%: 10.3 % — ABNORMAL HIGH (ref 0.0–7.0)
Eosinophils Absolute: 0.9 10*3/uL — ABNORMAL HIGH (ref 0.0–0.5)
HCT: 35.1 % — ABNORMAL LOW (ref 38.7–49.9)
HGB: 12.1 g/dL — ABNORMAL LOW (ref 13.0–17.1)
MONO#: 0.6 10*3/uL (ref 0.1–0.9)
NEUT#: 4 10*3/uL (ref 1.5–6.5)
NEUT%: 47.3 % (ref 40.0–75.0)
RDW: 14.2 % (ref 11.2–14.6)
WBC: 8.6 10*3/uL (ref 4.0–10.0)
lymph#: 3 10*3/uL (ref 0.9–3.3)

## 2008-02-18 ENCOUNTER — Ambulatory Visit: Payer: Self-pay | Admitting: Oncology

## 2008-02-23 LAB — COMPREHENSIVE METABOLIC PANEL
ALT: 20 U/L (ref 0–53)
BUN: 17 mg/dL (ref 6–23)
CO2: 19 mEq/L (ref 19–32)
Calcium: 8.8 mg/dL (ref 8.4–10.5)
Chloride: 109 mEq/L (ref 96–112)
Creatinine, Ser: 1.25 mg/dL (ref 0.40–1.50)
Glucose, Bld: 170 mg/dL — ABNORMAL HIGH (ref 70–99)
Total Bilirubin: 0.3 mg/dL (ref 0.3–1.2)

## 2008-02-23 LAB — CBC WITH DIFFERENTIAL/PLATELET
BASO%: 0.5 % (ref 0.0–2.0)
Basophils Absolute: 0 10*3/uL (ref 0.0–0.1)
HCT: 33.9 % — ABNORMAL LOW (ref 38.7–49.9)
HGB: 11.7 g/dL — ABNORMAL LOW (ref 13.0–17.1)
LYMPH%: 38.9 % (ref 14.0–48.0)
MCH: 28.4 pg (ref 28.0–33.4)
MCHC: 34.5 g/dL (ref 32.0–35.9)
MONO#: 0.5 10*3/uL (ref 0.1–0.9)
NEUT%: 42.3 % (ref 40.0–75.0)
Platelets: 252 10*3/uL (ref 145–400)
WBC: 7.6 10*3/uL (ref 4.0–10.0)

## 2008-02-23 LAB — LACTATE DEHYDROGENASE: LDH: 137 U/L (ref 94–250)

## 2008-03-24 LAB — CBC WITH DIFFERENTIAL/PLATELET
BASO%: 0.8 % (ref 0.0–2.0)
EOS%: 10.6 % — ABNORMAL HIGH (ref 0.0–7.0)
HCT: 32.7 % — ABNORMAL LOW (ref 38.7–49.9)
LYMPH%: 40.9 % (ref 14.0–48.0)
MCH: 28.9 pg (ref 28.0–33.4)
MCHC: 34.7 g/dL (ref 32.0–35.9)
NEUT%: 40.2 % (ref 40.0–75.0)
Platelets: 235 10*3/uL (ref 145–400)

## 2008-04-15 ENCOUNTER — Ambulatory Visit: Payer: Self-pay | Admitting: Oncology

## 2008-04-20 LAB — CBC WITH DIFFERENTIAL/PLATELET
BASO%: 0.7 % (ref 0.0–2.0)
Eosinophils Absolute: 0.9 10*3/uL — ABNORMAL HIGH (ref 0.0–0.5)
HCT: 32.8 % — ABNORMAL LOW (ref 38.7–49.9)
LYMPH%: 38.4 % (ref 14.0–48.0)
MCHC: 34.3 g/dL (ref 32.0–35.9)
MCV: 84.2 fL (ref 81.6–98.0)
MONO#: 0.6 10*3/uL (ref 0.1–0.9)
MONO%: 7.1 % (ref 0.0–13.0)
NEUT%: 42.9 % (ref 40.0–75.0)
Platelets: 218 10*3/uL (ref 145–400)
WBC: 8.1 10*3/uL (ref 4.0–10.0)

## 2008-06-25 ENCOUNTER — Ambulatory Visit: Payer: Self-pay | Admitting: Oncology

## 2008-08-26 ENCOUNTER — Ambulatory Visit: Payer: Self-pay | Admitting: Oncology

## 2008-08-30 LAB — CBC WITH DIFFERENTIAL/PLATELET
Basophils Absolute: 0 10*3/uL (ref 0.0–0.1)
Eosinophils Absolute: 0.6 10*3/uL — ABNORMAL HIGH (ref 0.0–0.5)
HCT: 33.9 % — ABNORMAL LOW (ref 38.7–49.9)
HGB: 11.8 g/dL — ABNORMAL LOW (ref 13.0–17.1)
LYMPH%: 33.2 % (ref 14.0–48.0)
MCV: 83.9 fL (ref 81.6–98.0)
MONO#: 0.5 10*3/uL (ref 0.1–0.9)
NEUT#: 3.9 10*3/uL (ref 1.5–6.5)
NEUT%: 52.4 % (ref 40.0–75.0)
Platelets: 242 10*3/uL (ref 145–400)
RBC: 4.04 10*6/uL — ABNORMAL LOW (ref 4.20–5.71)
WBC: 7.4 10*3/uL (ref 4.0–10.0)

## 2008-10-27 ENCOUNTER — Ambulatory Visit: Payer: Self-pay | Admitting: Oncology

## 2008-10-29 LAB — CBC WITH DIFFERENTIAL/PLATELET
BASO%: 0.3 % (ref 0.0–2.0)
EOS%: 8.8 % — ABNORMAL HIGH (ref 0.0–7.0)
LYMPH%: 34.9 % (ref 14.0–48.0)
MCH: 28.9 pg (ref 28.0–33.4)
MCHC: 34.6 g/dL (ref 32.0–35.9)
MONO#: 0.5 10*3/uL (ref 0.1–0.9)
RBC: 4.05 10*6/uL — ABNORMAL LOW (ref 4.20–5.71)
WBC: 7.3 10*3/uL (ref 4.0–10.0)
lymph#: 2.5 10*3/uL (ref 0.9–3.3)

## 2008-10-29 LAB — COMPREHENSIVE METABOLIC PANEL
ALT: 19 U/L (ref 0–53)
AST: 14 U/L (ref 0–37)
CO2: 19 mEq/L (ref 19–32)
Chloride: 107 mEq/L (ref 96–112)
Creatinine, Ser: 1.42 mg/dL (ref 0.40–1.50)
Sodium: 138 mEq/L (ref 135–145)
Total Bilirubin: 0.4 mg/dL (ref 0.3–1.2)
Total Protein: 6.8 g/dL (ref 6.0–8.3)

## 2008-10-29 LAB — LACTATE DEHYDROGENASE: LDH: 121 U/L (ref 94–250)

## 2008-12-22 ENCOUNTER — Ambulatory Visit: Payer: Self-pay | Admitting: Oncology

## 2008-12-27 LAB — CBC WITH DIFFERENTIAL/PLATELET
BASO%: 0.5 % (ref 0.0–2.0)
LYMPH%: 35.2 % (ref 14.0–48.0)
MCH: 28.8 pg (ref 28.0–33.4)
MCHC: 34.6 g/dL (ref 32.0–35.9)
MCV: 83.1 fL (ref 81.6–98.0)
MONO%: 7.1 % (ref 0.0–13.0)
NEUT%: 49.2 % (ref 40.0–75.0)
Platelets: 237 10*3/uL (ref 145–400)
RBC: 4.14 10*6/uL — ABNORMAL LOW (ref 4.20–5.71)
WBC: 8 10*3/uL (ref 4.0–10.0)

## 2009-02-15 ENCOUNTER — Ambulatory Visit: Payer: Self-pay | Admitting: Oncology

## 2009-04-26 ENCOUNTER — Ambulatory Visit: Payer: Self-pay | Admitting: Oncology

## 2009-04-28 LAB — CBC WITH DIFFERENTIAL/PLATELET
BASO%: 0.4 % (ref 0.0–2.0)
Basophils Absolute: 0 10*3/uL (ref 0.0–0.1)
EOS%: 7.6 % — ABNORMAL HIGH (ref 0.0–7.0)
Eosinophils Absolute: 0.5 10*3/uL (ref 0.0–0.5)
HCT: 33.8 % — ABNORMAL LOW (ref 38.4–49.9)
HGB: 11.9 g/dL — ABNORMAL LOW (ref 13.0–17.1)
LYMPH%: 39.1 % (ref 14.0–49.0)
MCH: 29.1 pg (ref 27.2–33.4)
MCHC: 35.1 g/dL (ref 32.0–36.0)
MCV: 82.9 fL (ref 79.3–98.0)
MONO#: 0.5 10*3/uL (ref 0.1–0.9)
MONO%: 6.7 % (ref 0.0–14.0)
NEUT#: 3.2 10*3/uL (ref 1.5–6.5)
NEUT%: 46.2 % (ref 39.0–75.0)
Platelets: 233 10*3/uL (ref 140–400)
RBC: 4.07 10*6/uL — ABNORMAL LOW (ref 4.20–5.82)
RDW: 13.4 % (ref 11.0–14.6)
WBC: 6.9 10*3/uL (ref 4.0–10.3)
lymph#: 2.7 10*3/uL (ref 0.9–3.3)

## 2009-05-01 LAB — COMPREHENSIVE METABOLIC PANEL
ALT: 26 U/L (ref 0–53)
AST: 18 U/L (ref 0–37)
Alkaline Phosphatase: 87 U/L (ref 39–117)
BUN: 20 mg/dL (ref 6–23)
Calcium: 9.1 mg/dL (ref 8.4–10.5)
Chloride: 109 mEq/L (ref 96–112)
Creatinine, Ser: 1.41 mg/dL (ref 0.40–1.50)
Total Bilirubin: 0.4 mg/dL (ref 0.3–1.2)

## 2009-05-01 LAB — IRON AND TIBC
Iron: 61 ug/dL (ref 42–165)
TIBC: 309 ug/dL (ref 215–435)
UIBC: 248 ug/dL

## 2009-05-01 LAB — LACTATE DEHYDROGENASE: LDH: 136 U/L (ref 94–250)

## 2009-10-28 ENCOUNTER — Ambulatory Visit: Payer: Self-pay | Admitting: Oncology

## 2009-11-01 LAB — CBC WITH DIFFERENTIAL/PLATELET
Basophils Absolute: 0 10*3/uL (ref 0.0–0.1)
EOS%: 6.7 % (ref 0.0–7.0)
Eosinophils Absolute: 0.6 10*3/uL — ABNORMAL HIGH (ref 0.0–0.5)
HCT: 34.8 % — ABNORMAL LOW (ref 38.4–49.9)
HGB: 11.9 g/dL — ABNORMAL LOW (ref 13.0–17.1)
MONO#: 0.5 10*3/uL (ref 0.1–0.9)
NEUT#: 4.1 10*3/uL (ref 1.5–6.5)
NEUT%: 49.9 % (ref 39.0–75.0)
RDW: 12.9 % (ref 11.0–14.6)
WBC: 8.3 10*3/uL (ref 4.0–10.3)
lymph#: 3 10*3/uL (ref 0.9–3.3)

## 2009-11-03 LAB — COMPREHENSIVE METABOLIC PANEL
AST: 23 U/L (ref 0–37)
Albumin: 4.3 g/dL (ref 3.5–5.2)
BUN: 29 mg/dL — ABNORMAL HIGH (ref 6–23)
CO2: 17 mEq/L — ABNORMAL LOW (ref 19–32)
Calcium: 9.4 mg/dL (ref 8.4–10.5)
Chloride: 109 mEq/L (ref 96–112)
Creatinine, Ser: 1.63 mg/dL — ABNORMAL HIGH (ref 0.40–1.50)
Potassium: 4.3 mEq/L (ref 3.5–5.3)

## 2009-11-03 LAB — LACTATE DEHYDROGENASE: LDH: 134 U/L (ref 94–250)

## 2009-11-03 LAB — IRON AND TIBC: Iron: 57 ug/dL (ref 42–165)

## 2009-12-14 ENCOUNTER — Encounter: Admission: RE | Admit: 2009-12-14 | Discharge: 2009-12-14 | Payer: Self-pay | Admitting: Family Medicine

## 2010-04-28 ENCOUNTER — Ambulatory Visit: Payer: Self-pay | Admitting: Oncology

## 2010-05-02 LAB — CBC WITH DIFFERENTIAL/PLATELET
BASO%: 0.3 % (ref 0.0–2.0)
Basophils Absolute: 0 10*3/uL (ref 0.0–0.1)
EOS%: 7.4 % — ABNORMAL HIGH (ref 0.0–7.0)
Eosinophils Absolute: 0.6 10*3/uL — ABNORMAL HIGH (ref 0.0–0.5)
HCT: 31 % — ABNORMAL LOW (ref 38.4–49.9)
HGB: 10.3 g/dL — ABNORMAL LOW (ref 13.0–17.1)
LYMPH%: 39.3 % (ref 14.0–49.0)
MCH: 27.5 pg (ref 27.2–33.4)
MCHC: 33.2 g/dL (ref 32.0–36.0)
MCV: 82.7 fL (ref 79.3–98.0)
MONO#: 0.5 10*3/uL (ref 0.1–0.9)
MONO%: 6.7 % (ref 0.0–14.0)
NEUT#: 3.4 10*3/uL (ref 1.5–6.5)
NEUT%: 46.3 % (ref 39.0–75.0)
Platelets: 284 10*3/uL (ref 140–400)
RBC: 3.75 10*6/uL — ABNORMAL LOW (ref 4.20–5.82)
RDW: 12.9 % (ref 11.0–14.6)
WBC: 7.4 10*3/uL (ref 4.0–10.3)
lymph#: 2.9 10*3/uL (ref 0.9–3.3)

## 2010-05-04 LAB — TRANSFERRIN RECEPTOR, SOLUABLE: Transferrin Receptor, Soluble: 15.5 nmol/L

## 2010-05-04 LAB — COMPREHENSIVE METABOLIC PANEL
ALT: 21 U/L (ref 0–53)
AST: 22 U/L (ref 0–37)
Albumin: 4 g/dL (ref 3.5–5.2)
Alkaline Phosphatase: 63 U/L (ref 39–117)
BUN: 26 mg/dL — ABNORMAL HIGH (ref 6–23)
CO2: 18 mEq/L — ABNORMAL LOW (ref 19–32)
Calcium: 8.7 mg/dL (ref 8.4–10.5)
Chloride: 108 mEq/L (ref 96–112)
Creatinine, Ser: 1.82 mg/dL — ABNORMAL HIGH (ref 0.40–1.50)
Glucose, Bld: 201 mg/dL — ABNORMAL HIGH (ref 70–99)
Potassium: 4 mEq/L (ref 3.5–5.3)
Sodium: 140 mEq/L (ref 135–145)
Total Bilirubin: 0.3 mg/dL (ref 0.3–1.2)
Total Protein: 6.9 g/dL (ref 6.0–8.3)

## 2010-05-04 LAB — ERYTHROPOIETIN: Erythropoietin: 9.8 m[IU]/mL (ref 2.6–34.0)

## 2010-05-04 LAB — IRON AND TIBC
%SAT: 15 % — ABNORMAL LOW (ref 20–55)
Iron: 53 ug/dL (ref 42–165)
TIBC: 356 ug/dL (ref 215–435)
UIBC: 303 ug/dL

## 2010-05-04 LAB — FERRITIN: Ferritin: 253 ng/mL (ref 22–322)

## 2010-05-04 LAB — LACTATE DEHYDROGENASE: LDH: 117 U/L (ref 94–250)

## 2010-06-02 ENCOUNTER — Ambulatory Visit: Payer: Self-pay | Admitting: Oncology

## 2010-06-06 LAB — CBC WITH DIFFERENTIAL/PLATELET
BASO%: 0.3 % (ref 0.0–2.0)
Basophils Absolute: 0 10*3/uL (ref 0.0–0.1)
EOS%: 4.5 % (ref 0.0–7.0)
Eosinophils Absolute: 0.3 10*3/uL (ref 0.0–0.5)
HCT: 31.8 % — ABNORMAL LOW (ref 38.4–49.9)
HGB: 10.8 g/dL — ABNORMAL LOW (ref 13.0–17.1)
LYMPH%: 27.8 % (ref 14.0–49.0)
MCH: 27.6 pg (ref 27.2–33.4)
MCHC: 34 g/dL (ref 32.0–36.0)
MCV: 81.3 fL (ref 79.3–98.0)
MONO#: 0.6 10*3/uL (ref 0.1–0.9)
MONO%: 9.1 % (ref 0.0–14.0)
NEUT#: 4.1 10*3/uL (ref 1.5–6.5)
NEUT%: 58.3 % (ref 39.0–75.0)
Platelets: 268 10*3/uL (ref 140–400)
RBC: 3.91 10*6/uL — ABNORMAL LOW (ref 4.20–5.82)
RDW: 14.1 % (ref 11.0–14.6)
WBC: 6.9 10*3/uL (ref 4.0–10.3)
lymph#: 1.9 10*3/uL (ref 0.9–3.3)

## 2010-07-20 ENCOUNTER — Ambulatory Visit: Payer: Self-pay | Admitting: Oncology

## 2010-07-25 LAB — COMPREHENSIVE METABOLIC PANEL
ALT: 19 U/L (ref 0–53)
AST: 18 U/L (ref 0–37)
Albumin: 4.1 g/dL (ref 3.5–5.2)
Alkaline Phosphatase: 54 U/L (ref 39–117)
BUN: 26 mg/dL — ABNORMAL HIGH (ref 6–23)
CO2: 19 mEq/L (ref 19–32)
Calcium: 8.8 mg/dL (ref 8.4–10.5)
Chloride: 104 mEq/L (ref 96–112)
Creatinine, Ser: 1.73 mg/dL — ABNORMAL HIGH (ref 0.40–1.50)
Glucose, Bld: 272 mg/dL — ABNORMAL HIGH (ref 70–99)
Potassium: 4.2 mEq/L (ref 3.5–5.3)
Sodium: 139 mEq/L (ref 135–145)
Total Bilirubin: 0.3 mg/dL (ref 0.3–1.2)
Total Protein: 6.9 g/dL (ref 6.0–8.3)

## 2010-07-25 LAB — CBC WITH DIFFERENTIAL/PLATELET
BASO%: 0.3 % (ref 0.0–2.0)
Basophils Absolute: 0 10*3/uL (ref 0.0–0.1)
EOS%: 1.1 % (ref 0.0–7.0)
Eosinophils Absolute: 0.1 10*3/uL (ref 0.0–0.5)
HCT: 34.1 % — ABNORMAL LOW (ref 38.4–49.9)
HGB: 11.3 g/dL — ABNORMAL LOW (ref 13.0–17.1)
LYMPH%: 20.6 % (ref 14.0–49.0)
MCH: 26.6 pg — ABNORMAL LOW (ref 27.2–33.4)
MCHC: 33.1 g/dL (ref 32.0–36.0)
MCV: 80.3 fL (ref 79.3–98.0)
MONO#: 0.4 10*3/uL (ref 0.1–0.9)
MONO%: 4.3 % (ref 0.0–14.0)
NEUT#: 6.8 10*3/uL — ABNORMAL HIGH (ref 1.5–6.5)
NEUT%: 73.7 % (ref 39.0–75.0)
Platelets: 310 10*3/uL (ref 140–400)
RBC: 4.24 10*6/uL (ref 4.20–5.82)
RDW: 15.5 % — ABNORMAL HIGH (ref 11.0–14.6)
WBC: 9.2 10*3/uL (ref 4.0–10.3)
lymph#: 1.9 10*3/uL (ref 0.9–3.3)

## 2010-07-25 LAB — LACTATE DEHYDROGENASE: LDH: 120 U/L (ref 94–250)

## 2010-08-22 ENCOUNTER — Ambulatory Visit: Payer: Self-pay | Admitting: Oncology

## 2010-08-22 LAB — CBC WITH DIFFERENTIAL/PLATELET
BASO%: 0.6 % (ref 0.0–2.0)
Basophils Absolute: 0 10*3/uL (ref 0.0–0.1)
EOS%: 9.8 % — ABNORMAL HIGH (ref 0.0–7.0)
Eosinophils Absolute: 0.6 10*3/uL — ABNORMAL HIGH (ref 0.0–0.5)
HCT: 33.4 % — ABNORMAL LOW (ref 38.4–49.9)
HGB: 11.4 g/dL — ABNORMAL LOW (ref 13.0–17.1)
LYMPH%: 40.8 % (ref 14.0–49.0)
MCH: 27.1 pg — ABNORMAL LOW (ref 27.2–33.4)
MCHC: 34.2 g/dL (ref 32.0–36.0)
MCV: 79.3 fL (ref 79.3–98.0)
MONO#: 0.4 10*3/uL (ref 0.1–0.9)
MONO%: 7 % (ref 0.0–14.0)
NEUT#: 2.6 10*3/uL (ref 1.5–6.5)
NEUT%: 41.8 % (ref 39.0–75.0)
Platelets: 267 10*3/uL (ref 140–400)
RBC: 4.2 10*6/uL (ref 4.20–5.82)
RDW: 15.7 % — ABNORMAL HIGH (ref 11.0–14.6)
WBC: 6.2 10*3/uL (ref 4.0–10.3)
lymph#: 2.5 10*3/uL (ref 0.9–3.3)

## 2010-10-13 ENCOUNTER — Ambulatory Visit: Payer: Self-pay | Admitting: Oncology

## 2010-10-17 LAB — CBC WITH DIFFERENTIAL/PLATELET
BASO%: 0.7 % (ref 0.0–2.0)
Basophils Absolute: 0 10*3/uL (ref 0.0–0.1)
EOS%: 8.6 % — ABNORMAL HIGH (ref 0.0–7.0)
Eosinophils Absolute: 0.6 10*3/uL — ABNORMAL HIGH (ref 0.0–0.5)
HCT: 34.7 % — ABNORMAL LOW (ref 38.4–49.9)
HGB: 11.8 g/dL — ABNORMAL LOW (ref 13.0–17.1)
LYMPH%: 39.8 % (ref 14.0–49.0)
MCH: 27.3 pg (ref 27.2–33.4)
MCHC: 33.8 g/dL (ref 32.0–36.0)
MCV: 80.8 fL (ref 79.3–98.0)
MONO#: 0.5 10*3/uL (ref 0.1–0.9)
MONO%: 6.9 % (ref 0.0–14.0)
NEUT#: 3 10*3/uL (ref 1.5–6.5)
NEUT%: 44 % (ref 39.0–75.0)
Platelets: 240 10*3/uL (ref 140–400)
RBC: 4.3 10*6/uL (ref 4.20–5.82)
RDW: 15.7 % — ABNORMAL HIGH (ref 11.0–14.6)
WBC: 6.9 10*3/uL (ref 4.0–10.3)
lymph#: 2.8 10*3/uL (ref 0.9–3.3)

## 2010-11-15 ENCOUNTER — Ambulatory Visit: Payer: Self-pay | Admitting: Oncology

## 2010-11-17 LAB — LACTATE DEHYDROGENASE: LDH: 151 U/L (ref 94–250)

## 2010-11-17 LAB — COMPREHENSIVE METABOLIC PANEL
ALT: 36 U/L (ref 0–53)
AST: 28 U/L (ref 0–37)
Albumin: 4.2 g/dL (ref 3.5–5.2)
Alkaline Phosphatase: 55 U/L (ref 39–117)
BUN: 19 mg/dL (ref 6–23)
CO2: 20 mEq/L (ref 19–32)
Calcium: 8.8 mg/dL (ref 8.4–10.5)
Chloride: 110 mEq/L (ref 96–112)
Creatinine, Ser: 1.41 mg/dL (ref 0.40–1.50)
Glucose, Bld: 228 mg/dL — ABNORMAL HIGH (ref 70–99)
Potassium: 4.2 mEq/L (ref 3.5–5.3)
Sodium: 142 mEq/L (ref 135–145)
Total Bilirubin: 0.3 mg/dL (ref 0.3–1.2)
Total Protein: 6.8 g/dL (ref 6.0–8.3)

## 2010-11-17 LAB — CBC WITH DIFFERENTIAL/PLATELET
BASO%: 0.5 % (ref 0.0–2.0)
Basophils Absolute: 0 10*3/uL (ref 0.0–0.1)
EOS%: 7.2 % — ABNORMAL HIGH (ref 0.0–7.0)
Eosinophils Absolute: 0.5 10*3/uL (ref 0.0–0.5)
HCT: 36.7 % — ABNORMAL LOW (ref 38.4–49.9)
HGB: 12.5 g/dL — ABNORMAL LOW (ref 13.0–17.1)
LYMPH%: 39.7 % (ref 14.0–49.0)
MCH: 27.7 pg (ref 27.2–33.4)
MCHC: 34.1 g/dL (ref 32.0–36.0)
MCV: 81.3 fL (ref 79.3–98.0)
MONO#: 0.5 10*3/uL (ref 0.1–0.9)
MONO%: 6.9 % (ref 0.0–14.0)
NEUT#: 3.1 10*3/uL (ref 1.5–6.5)
NEUT%: 45.7 % (ref 39.0–75.0)
Platelets: 262 10*3/uL (ref 140–400)
RBC: 4.52 10*6/uL (ref 4.20–5.82)
RDW: 15.7 % — ABNORMAL HIGH (ref 11.0–14.6)
WBC: 6.7 10*3/uL (ref 4.0–10.3)
lymph#: 2.7 10*3/uL (ref 0.9–3.3)

## 2010-11-19 HISTORY — PX: MOHS SURGERY: SUR867

## 2010-11-24 ENCOUNTER — Encounter
Admission: RE | Admit: 2010-11-24 | Discharge: 2010-11-24 | Payer: Self-pay | Source: Home / Self Care | Attending: Family Medicine | Admitting: Family Medicine

## 2010-12-12 LAB — CBC WITH DIFFERENTIAL/PLATELET
BASO%: 0.2 % (ref 0.0–2.0)
Basophils Absolute: 0 10*3/uL (ref 0.0–0.1)
EOS%: 6.9 % (ref 0.0–7.0)
Eosinophils Absolute: 0.5 10*3/uL (ref 0.0–0.5)
HCT: 36.8 % — ABNORMAL LOW (ref 38.4–49.9)
HGB: 12.4 g/dL — ABNORMAL LOW (ref 13.0–17.1)
LYMPH%: 42.3 % (ref 14.0–49.0)
MCH: 27.7 pg (ref 27.2–33.4)
MCHC: 33.9 g/dL (ref 32.0–36.0)
MCV: 81.8 fL (ref 79.3–98.0)
MONO#: 0.5 10*3/uL (ref 0.1–0.9)
MONO%: 7.9 % (ref 0.0–14.0)
NEUT#: 2.8 10*3/uL (ref 1.5–6.5)
NEUT%: 42.7 % (ref 39.0–75.0)
Platelets: 223 10*3/uL (ref 140–400)
RBC: 4.5 10*6/uL (ref 4.20–5.82)
RDW: 15.9 % — ABNORMAL HIGH (ref 11.0–14.6)
WBC: 6.5 10*3/uL (ref 4.0–10.3)
lymph#: 2.8 10*3/uL (ref 0.9–3.3)

## 2011-01-09 ENCOUNTER — Other Ambulatory Visit: Payer: Self-pay | Admitting: Dermatology

## 2011-01-09 ENCOUNTER — Other Ambulatory Visit (HOSPITAL_COMMUNITY): Payer: Self-pay | Admitting: Oncology

## 2011-01-09 ENCOUNTER — Encounter (HOSPITAL_BASED_OUTPATIENT_CLINIC_OR_DEPARTMENT_OTHER): Payer: BC Managed Care – PPO | Admitting: Oncology

## 2011-01-09 DIAGNOSIS — D649 Anemia, unspecified: Secondary | ICD-10-CM

## 2011-01-09 LAB — CBC WITH DIFFERENTIAL/PLATELET
BASO%: 0.4 % (ref 0.0–2.0)
Basophils Absolute: 0 10*3/uL (ref 0.0–0.1)
EOS%: 5.6 % (ref 0.0–7.0)
Eosinophils Absolute: 0.3 10*3/uL (ref 0.0–0.5)
HCT: 34.4 % — ABNORMAL LOW (ref 38.4–49.9)
HGB: 11.7 g/dL — ABNORMAL LOW (ref 13.0–17.1)
LYMPH%: 43.6 % (ref 14.0–49.0)
MCH: 28 pg (ref 27.2–33.4)
MCHC: 33.9 g/dL (ref 32.0–36.0)
MCV: 82.6 fL (ref 79.3–98.0)
MONO#: 0.4 10*3/uL (ref 0.1–0.9)
MONO%: 6.6 % (ref 0.0–14.0)
NEUT#: 2.7 10*3/uL (ref 1.5–6.5)
NEUT%: 43.8 % (ref 39.0–75.0)
Platelets: 216 10*3/uL (ref 140–400)
RBC: 4.16 10*6/uL — ABNORMAL LOW (ref 4.20–5.82)
RDW: 14.7 % — ABNORMAL HIGH (ref 11.0–14.6)
WBC: 6.2 10*3/uL (ref 4.0–10.3)
lymph#: 2.7 10*3/uL (ref 0.9–3.3)

## 2011-02-06 ENCOUNTER — Other Ambulatory Visit (HOSPITAL_COMMUNITY): Payer: Self-pay | Admitting: Oncology

## 2011-02-06 ENCOUNTER — Encounter (HOSPITAL_BASED_OUTPATIENT_CLINIC_OR_DEPARTMENT_OTHER): Payer: BC Managed Care – PPO | Admitting: Oncology

## 2011-02-06 DIAGNOSIS — D649 Anemia, unspecified: Secondary | ICD-10-CM

## 2011-02-06 LAB — CBC WITH DIFFERENTIAL/PLATELET
BASO%: 0.3 % (ref 0.0–2.0)
Basophils Absolute: 0 10*3/uL (ref 0.0–0.1)
EOS%: 8.2 % — ABNORMAL HIGH (ref 0.0–7.0)
Eosinophils Absolute: 0.5 10*3/uL (ref 0.0–0.5)
HCT: 32.9 % — ABNORMAL LOW (ref 38.4–49.9)
HGB: 11.4 g/dL — ABNORMAL LOW (ref 13.0–17.1)
LYMPH%: 27.3 % (ref 14.0–49.0)
MCH: 28.9 pg (ref 27.2–33.4)
MCHC: 34.6 g/dL (ref 32.0–36.0)
MCV: 83.6 fL (ref 79.3–98.0)
MONO#: 0.6 10*3/uL (ref 0.1–0.9)
MONO%: 9.7 % (ref 0.0–14.0)
NEUT#: 3.6 10*3/uL (ref 1.5–6.5)
NEUT%: 54.5 % (ref 39.0–75.0)
Platelets: 194 10*3/uL (ref 140–400)
RBC: 3.93 10*6/uL — ABNORMAL LOW (ref 4.20–5.82)
RDW: 13.8 % (ref 11.0–14.6)
WBC: 6.7 10*3/uL (ref 4.0–10.3)
lymph#: 1.8 10*3/uL (ref 0.9–3.3)

## 2011-03-09 ENCOUNTER — Other Ambulatory Visit (HOSPITAL_COMMUNITY): Payer: Self-pay | Admitting: Oncology

## 2011-03-09 ENCOUNTER — Encounter (HOSPITAL_BASED_OUTPATIENT_CLINIC_OR_DEPARTMENT_OTHER): Payer: BC Managed Care – PPO | Admitting: Oncology

## 2011-03-09 DIAGNOSIS — D649 Anemia, unspecified: Secondary | ICD-10-CM

## 2011-03-09 DIAGNOSIS — N289 Disorder of kidney and ureter, unspecified: Secondary | ICD-10-CM

## 2011-03-09 LAB — CBC WITH DIFFERENTIAL/PLATELET
BASO%: 0.5 % (ref 0.0–2.0)
Basophils Absolute: 0 10*3/uL (ref 0.0–0.1)
EOS%: 8.1 % — ABNORMAL HIGH (ref 0.0–7.0)
Eosinophils Absolute: 0.5 10*3/uL (ref 0.0–0.5)
HCT: 33.4 % — ABNORMAL LOW (ref 38.4–49.9)
HGB: 11.5 g/dL — ABNORMAL LOW (ref 13.0–17.1)
LYMPH%: 39 % (ref 14.0–49.0)
MCH: 29.4 pg (ref 27.2–33.4)
MCHC: 34.4 g/dL (ref 32.0–36.0)
MCV: 85.5 fL (ref 79.3–98.0)
MONO#: 0.4 10*3/uL (ref 0.1–0.9)
MONO%: 5.5 % (ref 0.0–14.0)
NEUT#: 3 10*3/uL (ref 1.5–6.5)
NEUT%: 46.9 % (ref 39.0–75.0)
Platelets: 214 10*3/uL (ref 140–400)
RBC: 3.91 10*6/uL — ABNORMAL LOW (ref 4.20–5.82)
RDW: 13 % (ref 11.0–14.6)
WBC: 6.5 10*3/uL (ref 4.0–10.3)
lymph#: 2.5 10*3/uL (ref 0.9–3.3)

## 2011-03-09 LAB — COMPREHENSIVE METABOLIC PANEL
ALT: 43 U/L (ref 0–53)
AST: 37 U/L (ref 0–37)
Albumin: 4.3 g/dL (ref 3.5–5.2)
Alkaline Phosphatase: 65 U/L (ref 39–117)
BUN: 14 mg/dL (ref 6–23)
CO2: 21 mEq/L (ref 19–32)
Calcium: 9.2 mg/dL (ref 8.4–10.5)
Chloride: 107 mEq/L (ref 96–112)
Creatinine, Ser: 1.38 mg/dL (ref 0.40–1.50)
Glucose, Bld: 172 mg/dL — ABNORMAL HIGH (ref 70–99)
Potassium: 4 mEq/L (ref 3.5–5.3)
Sodium: 141 mEq/L (ref 135–145)
Total Bilirubin: 0.3 mg/dL (ref 0.3–1.2)
Total Protein: 6.5 g/dL (ref 6.0–8.3)

## 2011-03-09 LAB — LACTATE DEHYDROGENASE: LDH: 138 U/L (ref 94–250)

## 2011-04-03 ENCOUNTER — Encounter (HOSPITAL_BASED_OUTPATIENT_CLINIC_OR_DEPARTMENT_OTHER): Payer: BC Managed Care – PPO | Admitting: Oncology

## 2011-04-03 ENCOUNTER — Other Ambulatory Visit (HOSPITAL_COMMUNITY): Payer: Self-pay | Admitting: Oncology

## 2011-04-03 DIAGNOSIS — D649 Anemia, unspecified: Secondary | ICD-10-CM

## 2011-04-03 LAB — CBC WITH DIFFERENTIAL/PLATELET
Eosinophils Absolute: 0.6 10*3/uL — ABNORMAL HIGH (ref 0.0–0.5)
HCT: 33.7 % — ABNORMAL LOW (ref 38.4–49.9)
HGB: 11.4 g/dL — ABNORMAL LOW (ref 13.0–17.1)
MCV: 86.1 fL (ref 79.3–98.0)
MONO#: 0.5 10*3/uL (ref 0.1–0.9)
RBC: 3.91 10*6/uL — ABNORMAL LOW (ref 4.20–5.82)
RDW: 12.9 % (ref 11.0–14.6)
lymph#: 2.7 10*3/uL (ref 0.9–3.3)

## 2011-04-06 NOTE — Op Note (Signed)
NAME:  [name]                           ACCOUNT NO.:  [id number]   MEDICAL RECORD NO.:  [id number]                   PATIENT TYPE:  INP   LOCATION:  0481                                 FACILITY:  [facility]   PHYSICIAN:  Madlyn Frankel. Charlann Boxer, M.D.               DATE OF BIRTH:  December 17, 1945   DATE OF PROCEDURE:  10/26/2003  DATE OF DISCHARGE:                                 OPERATIVE REPORT   PREOPERATIVE DIAGNOSIS:  Right hip end-stage osteoarthritis.   POSTOPERATIVE DIAGNOSIS:  Right hip end-stage osteoarthritis.   PROCEDURE:  Right total hip replacement.   COMPONENTS USED:  Depuy Trilock femoral stem size 13.8 mm with lateralized  offset with a 36 plus 1.5 mm Articules metal on metal ball, a 54 mm pinnacle  acetabular cup and a metal on metal ultimate 54 liner.   SURGEON:  Madlyn Frankel. Charlann Boxer, M.D.   ASSISTANT:  Clarene Reamer, P.A.-C.   ANESTHESIA:  General.   ESTIMATED BLOOD LOSS:  800 mL   DRAINS:  Drains x1.   INDICATIONS FOR PROCEDURE:  Mr. Dowty is a pleasant 65 year old gentleman  who was followed in clinica with right hip pain.  The patient had  progressively worsening right hip pain to the point where he had difficulty  ambulating and performing his normal function.  His quality of life was  considerably reduced secondary to the pain he was having associated with his  hip pain.  Radiographs confirmed end-stage osteoarthritis.  The patient was  noted not to have a significant amount of osteophyte formation.  After  discussing the risks and benefits of total hip replacement including  articular surfaces, the patient consented for metal on metal right total hip  arthroplasty.   DESCRIPTION OF PROCEDURE:  The patient was brought to the operative theater.  Once adequate anesthesia and preoperative antibiotics, 1 g of Ancef were  administered, the patient was positioned in the left lateral decubitus  position with the right side up.  The right lower extremity was then  prepped  and draped in the sterile fashion.  A lateral based incision was made for a  posterior approach to the hip.  Blunt dissection was carried through the  iliotibial band and gluteus maximus fascia.  Short external rotators were  taken down separately from the posterior capsule.  The capsule was incised  with the intent of later repair.  Following acetabular exposure, reaming  commenced with a 47 reamer and was carried up to a 53 reamer.  This was  excellent fit of the component with bleeding bone.  The 54 cup was then  impacted into position, final position was roughly about 40 degrees of  abduction, 20 degrees of forward flexion.  Two screws were placed, trial  liner placed.   At this point, attention was directed to the femoral component.  Femoral  neck cut was noted to have been initially in a little  bit more version so it  was recut.  Femoral preparation was carried out per protocol for the Trilock  system beginning with a size 8 broach and working all the way up to a 13.8  broach.  Note that about 10 degrees of anteversion was added to the  patient's native position for a combined of around 20, based on the tibial  axis.  Trial reduction was initially carried out with an increased offset  stem and a 1.5 plus ball.  The patient was noted to be stable with at least  80 degrees of internal rotation with the hip flexed about 70 degrees of  neutral abduction.  He tolerated the sleep position __________.  The patient  was noted to have some lateral IT band tightness. This was subsequently  released about 2 cm anterior.  With this, the patient was able to adduct  without difficulty and externally rotate.  The patient's combined  anteversion was noted to be about 45 degrees.  There was no evidence of  impingement with the extension of external rotation.  Based on this, the  trial components were removed, final liner was then impacted into position  on the acetabular side.  The final  13.8 mm lateralized offset stem was  impacted into position and a 1.5 ball impacted on __________ trunnion.  The  hip was reduced at the end of the case, there was no capsular or soft tissue  that was able to be reapproximated through the trochanteric region so it was  excised.  At this point, a medium Hemovac drain was placed deep to the  iliotibial band.  The iliotibial band and gluteus maximus fascia was then  reapproximated using a #1 Vicryl.  The deep subcutaneous layer was  reapproximated using a #0 Vicryl, 2-0 Vicryl underneath the skin and a  running 4-0 Monocryl.  The patient tolerated the procedure without  complications.  The right hip was cleaned and dried and dressed sterilely  with Steri-Strips, Adaptic dressing, sponges, and tape.  The patient was  transferred to the recovery room and extubated in stable condition.                                               Madlyn Frankel Charlann Boxer, M.D.    MDO/MEDQ  D:  10/26/2003  T:  10/27/2003  Job:  [id number]

## 2011-04-06 NOTE — Procedures (Signed)
NAME:  [name], [name]                 ACCOUNT NO.:  [id number]   MEDICAL RECORD NO.:  [id number]          PATIENT TYPE:  OUT   LOCATION:  SLEEP CENTER                 FACILITY:  [facility]   PHYSICIAN:  Clinton D. Maple Hudson, M.D. DATE OF BIRTH:  [birth year]   DATE OF STUDY:  08/30/2005                              NOCTURNAL POLYSOMNOGRAM   REFERRING PHYSICIAN:  Dr. Donia Guiles.   DATE OF STUDY:  August 30, 2005.   INDICATION FOR STUDY:  Hypersomnia with sleep apnea. Epworth sleepiness  score 19/24.   BMI:  35.   WEIGHT:  250 pounds.   SLEEP ARCHITECTURE:  Total sleep time 260 minutes with sleep efficiency 66%.  Stage I 19%, stage II 46%, stages III and IV were absent, REM 1% of total  sleep time. Sleep latency 44 minutes, REM latency 282 minutes, awake after  sleep onset 94 minutes, arousal index 85. No bedtime medication was  reported.   RESPIRATORY DATA:  Apnea/hypopnea index (AHI, RDI) 84.8 obstructive events  per hour indicating very severe obstructive sleep apnea/hypopnea syndrome.  There were 240 obstructive apneas and 128 hypopneas. Events were not  positional. REM AHI 106. The technician could not utilize C-PAP titration by  split protocol. The patient was unable to accumulate enough sleep to meet  protocol requirements during the first 2 hours of the study.   OXYGEN DATA:  Moderate to loud snoring with oxygen desaturation to a nadir  of 76%. Mean oxygen saturation through the study was 94% on room air.   CARDIAC DATA:  Normal sinus rhythm.   MOVEMENT/PARASOMNIA:  Frequent limb jerks but with little associated sleep  disturbance.   IMPRESSION/RECOMMENDATIONS:  1.  Severe obstructive sleep apnea/hypopnea syndrome, AHI 84.8 per hour with      moderate to loud snoring and oxygen desaturation to 76%.  2.  Sleep markedly fragmented by respiratory events.  3.  Consider return for C-PAP titration or evaluate for alternative      therapies as appropriate.      Clinton D.  Maple Hudson, M.D.  Diplomate, Biomedical engineer of Sleep Medicine  Electronically Signed     CDY/MEDQ  D:  09/02/2005 12:44:26  T:  09/02/2005 22:31:35  Job:  [id number]

## 2011-04-06 NOTE — Procedures (Signed)
NAME:  [name], [name]                 ACCOUNT NO.:  [id number]   MEDICAL RECORD NO.:  [id number]          PATIENT TYPE:  OUT   LOCATION:  SLEEP CENTER                 [facility]AC[facility]   PHYSICIAN:  Clinton D. Maple Hudson, M.D. DATE OF BIRTH:  12/19/1945   DATE OF STUDY:                              NOCTURNAL POLYSOMNOGRAM   REFERRING PHYSICIAN:  Dr. Donia Guiles   INDICATION FOR STUDY:  Hypersomnia with sleep apnea.   EPWORTH SLEEPINESS SCORE:  19/24. BMI 35.7, weight 250 pounds.   HOME MEDICATIONS:  Amitriptyline, Lipitor, colchicine, Trandate.   A baseline diagnostic NPSG on August 30, 2005, recorded an AHI of 84.8 per  hour. CPAP titration is requested.   SLEEP ARCHITECTURE:  Short total sleep time 227 minutes with sleep  efficiency 64%. Stage 1 was 13%, stage 2 54%, stages 3 and 4 15%, REM 18% of  total sleep time. Sleep latency 64 minutes, REM latency 247 minutes, awake  after sleep onset 63 minutes, arousal index 34.6. Bedtime medication was not  recorded.   RESPIRATORY DATA:  CPAP titration protocol. CPAP was titrated to 14 CWP, AHI  8.7 per hour. A Breeze nasal air tube mask with large (yellow) pillows and  heated humidifier was used.   OXYGEN DATA:  Snoring was prevented and oxygen saturation held 95-98% at  final CPAP pressures.   CARDIAC DATA:  Normal sinus rhythm.   MOVEMENT/PARASOMNIA:  A total of 80 limb jerks were recorded, of which 13  were associated with arousal or awakening for a periodic limb movement with  arousal index at 3.4 per hour which is mildly increased. Bathroom x2.   IMPRESSIONS/RECOMMENDATIONS:  1.  CPAP titration to 14 CWP, AHI 8.7 per hour. The technician may have      stopped a little early. Consider a home trial at 15 CWP for more      complete clearing of events. A Breeze nasal air tube mask was used with      large nasal pillows and heated humidifier. Technician noted some      residual oral venting and suggested that the patient may  eventually      benefit from a chin strap as well. This can be assessed by the home care      company if appropriate.  2.  A baseline diagnostic NPSG on August 30, 2005, had recorded an AHI of      84.8 per hour.  3.  Periodic limb movement with arousal 3.4 per hour. This may not be      clinically significant once the patient has adjusted to CPAP.      Clinton D. Maple Hudson, M.D.  Diplomate, Biomedical engineer of Sleep Medicine  Electronically Signed     CDY/MEDQ  D:  09/30/2005 11:29:16  T:  10/01/2005 13:58:40  Job:  [id number]

## 2011-04-06 NOTE — H&P (Signed)
NAME:  [name], [name]                           ACCOUNT NO.:  [id number]   MEDICAL RECORD NO.:  [id number]                   PATIENT TYPE:  INP   LOCATION:  NA                                   [facility]ACILITY:  [facility]   PHYSICIAN:  Madlyn Frankel. Charlann Boxer, M.D.               DATE OF BIRTH:  1946/09/05   DATE OF ADMISSION:  DATE OF DISCHARGE:                                HISTORY & PHYSICAL   The patient is scheduled to be admitted to [facility] on October 26, 2003.   CHIEF COMPLAINT:  Pain in my right hip.   HISTORY OF PRESENT ILLNESS:  This 65 year old white male has been seen by  Dr. Montez Morita now for some time, complaining of hip and knee pain.  When seen  by Dr. Charlann Boxer, his primary complaint was right hip pain with majority of the  pain being in the right groin area.  He had recently traveled to [place] and  was unable to really do anything over there due to pain in this hip.  Going  up and down stairs is markedly uncomfortable as well as getting in and out  of a car.  He occasionally takes Celebrex, which gives him minimal help.   PHYSICAL EXAMINATION:  An antalgic gait with ambulation.  Somewhat of a  Trendelenburg-type gait as well.  Range of motion of the right hip is  uncomfortable.   X-rays are reviewed.  He has osteophyte formation with a coincentric type of  wear pattern to the hip joint.   It was discussed at length possible treatment programs; however, due to the  advanced osteoarthritis as well as the groin pain and this gentleman's  difficulty getting about, it was felt that it would be best to go with total  hip replacement and arthroplasty of the right hip.   PAST MEDICAL HISTORY:  This patient has a history of gouty arthritis,  hypertension, hyperlipidemia, NIDDM, and history of Agent Orange exposure  during his tour in [place].  It has contributed with leg cramps which are  treated primarily with Elavil.  His last surgeries have been arthroscopies  of the left knee.   His last gouty attack was last February.   CURRENT MEDICATIONS:  He does not have the dosages nor schedules.  Lipitor,  Celebrex, Accupril, Norvasc, Elavil, colchicine, Avandia, labetalol,  Glipizide, aspirin, and vitamin E (he will stop the aspirin and vitamin E  prior to surgery).   ALLERGIES:  No known drug allergies.   SOCIAL HISTORY:  Patient has no intake of tobacco and occasional beer.  Family physician is Dr. Donia Guiles.   FAMILY HISTORY:  Noncontributory.   REVIEW OF SYSTEMS:  CNS:  No seizures, paralysis, numbness, double vision.  RESPIRATORY:  No productive cough.  No hemoptysis.  No shortness of breath.  CARDIOVASCULAR:  No chest pain.  No angina or orthopnea.  GASTROINTESTINAL:  No nausea,  vomiting, melena or bloody stool.  GENITOURINARY:  No discharge,  dysuria, or hematuria.  MUSCULOSKELETAL:  Primarily in the present illness.   PHYSICAL EXAMINATION:  GENERAL:  Alert, cooperative, friendly, gregarious 7-  year-old white male.  VITAL SIGNS:  Blood pressure 152/88, pulse 72.  HEENT:  Normocephalic.  PERRLA.  EOMs are intact.  Oropharynx is clear.  CHEST:  Clear to auscultation.  No rales or rhonchi.  No wheezes.  HEART:  Regular rate and rhythm.  No murmurs are heard.  ABDOMEN:  Obese and soft.  Nontender.  Liver and spleen not felt.  GENITALIA/RECTAL:  Not done.  Not pertinent to present illness.  EXTREMITIES:  Right hip as in present illness above.   ADMISSION DIAGNOSES:  1. Osteoarthritis of the right hip.  2. Gout.  3. Hypertension.  4. Hyperlipidemia.  5. Diabetes.  6. History of exposure to [employer].   PLAN:  The patient is to undergo right total hip replacement and  arthroplasty.  Will be planned for home health with [facility] after the  surgery and Coumadin protocol for four weeks after surgery.     Dooley L. Cherlynn June.                 Madlyn Frankel Charlann Boxer, M.D.    DLU/MEDQ  D:  10/20/2003  T:  10/20/2003  Job:  [id number]   cc:   Donia Guiles, M.D.  [address] [place]  [place] [address]  Fax: [phone]

## 2011-04-06 NOTE — Discharge Summary (Signed)
NAME:  [name], [name]                           ACCOUNT NO.:  [id number]   MEDICAL RECORD NO.:  [id number]                   PATIENT TYPE:  INP   LOCATION:  0481                                 FACILITY:  [facility]   PHYSICIAN:  Madlyn Frankel. Charlann Boxer, M.D.               DATE OF BIRTH:  Mar 26, 1946   DATE OF ADMISSION:  10/26/2003  DATE OF DISCHARGE:  10/30/2003                                 DISCHARGE SUMMARY   ADMITTING DIAGNOSES:  1. Osteoarthritis right hip.  2. Gout.  3. Hypertension.  4. Hyperlipidemia.  5. Diabetes.  6. History of exposure to [employer].   DISCHARGE DIAGNOSES:  1. Osteoarthritis right hip.  2. Gout.  3. Hypertension.  4. Hyperlipidemia.  5. Diabetes.  6. History of exposure to [employer].  7. Mild postoperative anemia.   OPERATION:  On October 26, 2003 the patient underwent DePugh tri-lock  femoral stem right total hip replacement arthroplasty with metal on metal  ball and a 54 mm Pinnacle acetabular cup and metal on metal Ultimate 54  liner.  Clarene Reamer, P.A.-C. assisted.   BRIEF HISTORY:  This 65 year old gentleman was seen in our office for  continuing right hip pain.  Over the months it progressed to the point where  he had difficulty ambulating and performing his day to day activities.  His  overall quality of life was markedly reduced secondary to the pain.  X-rays  revealed end-stage osteoarthritis of the right hip.  Also, a significant  amount of osteophyte formation was seen about the hip joint itself.  After  discussion of the risks and benefits of surgery the patient was scheduled  and admitted for the above procedure.   HOSPITAL COURSE:  The patient tolerated the surgical procedure quite well.  He did have a drop in hemoglobin to 8.0.  We discussed at length the  benefits of transfusion.  The patient agreed and was transfused with 2 units  of packed red blood cells.  This brought his hemoglobin back up to 9.0 with  hematocrit 26.4.  The  patient felt much better after his transfusion and  began actively participating in total hip protocol.   On the third postoperative day the patient began complaining of abdominal  discomfort.  He is an obese gentleman but his abdomen was quite distended.  It was not rigid, but somewhat tender to palpation with deep palpation.  Bowel sounds were present, but very quiet.  We started ileus protocol with a  liquid diet, Reglan, Pepcid, Dulcolax tablets.  We held his discharge until  he had a BM so he would not be sent home and then develop a regular ileus.  On postoperative day #4 the patient had a bowel movement, was ambulating in  the hall, doing quite well.  His hip surgical hip wound was dry without any  evidence of infection.  Neurovascular was intact to the right lower  extremity and his calf was soft.  Temperature which had been elevated was  now 98.8.  He will have home health with [place] and [facility]  delivered his rolling walker and 3-in-1 commode.   LABORATORIES:  Preoperative hemoglobin/hematocrit with slight anemia.  Hemoglobin was 11.7.  Hematocrit was 34.1.  Final hemoglobin was 9.0.  Hematocrit was 26.4.  It was noted that he had 10 eosinophils in the  differential, otherwise normal.  Blood chemistries remained normal.  Did  have a slightly elevated glucose but this was at 89 at discharge.  Urinalysis was negative for urinary tract infection.  Electrocardiogram  showed normal sinus rhythm.  Chest x-ray showed the heart size upper limits  of normal.  Lungs were clear.  No effusion.  No active disease.   CONDITION ON DISCHARGE:  Improved, stable.   PLAN:  The patient is discharged to his home to continue with his total hip  protocol with [facility].   DISCHARGE MEDICATIONS:  1. Percocet 5/325 #40 one to two q.4-6h. p.r.n. pain.  2. Robaxin 500 mg #30 with two refills one q.6h. p.r.n. muscle spasm.  3. Coumadin per pharmacy (he was started on DVT  prophylaxis immediately     postoperatively).  4. Trinsicon b.i.d. x1 month.   FOLLOWUP:  Return to see Madlyn Frankel. Charlann Boxer, M.D. about two weeks after date of  surgery.  He is to call if any problems.     Dooley L. Cherlynn June.                 Madlyn Frankel Charlann Boxer, M.D.    DLU/MEDQ  D:  11/03/2003  T:  11/03/2003  Job:  [id number]   cc:   Donia Guiles, M.D.  [address] [place]  [place] [address]  Fax: [phone]

## 2011-05-03 ENCOUNTER — Encounter (HOSPITAL_BASED_OUTPATIENT_CLINIC_OR_DEPARTMENT_OTHER): Payer: BC Managed Care – PPO | Admitting: Oncology

## 2011-05-03 ENCOUNTER — Other Ambulatory Visit (HOSPITAL_COMMUNITY): Payer: Self-pay | Admitting: Oncology

## 2011-05-03 DIAGNOSIS — D649 Anemia, unspecified: Secondary | ICD-10-CM

## 2011-05-03 LAB — CBC WITH DIFFERENTIAL/PLATELET
Eosinophils Absolute: 0.5 10*3/uL (ref 0.0–0.5)
HCT: 33.1 % — ABNORMAL LOW (ref 38.4–49.9)
HGB: 11.2 g/dL — ABNORMAL LOW (ref 13.0–17.1)
LYMPH%: 39.8 % (ref 14.0–49.0)
MONO#: 0.5 10*3/uL (ref 0.1–0.9)
NEUT#: 3.1 10*3/uL (ref 1.5–6.5)
NEUT%: 44.8 % (ref 39.0–75.0)
Platelets: 200 10*3/uL (ref 140–400)
WBC: 7 10*3/uL (ref 4.0–10.3)

## 2011-05-31 ENCOUNTER — Other Ambulatory Visit (HOSPITAL_COMMUNITY): Payer: Self-pay | Admitting: Oncology

## 2011-05-31 ENCOUNTER — Encounter (HOSPITAL_BASED_OUTPATIENT_CLINIC_OR_DEPARTMENT_OTHER): Payer: BC Managed Care – PPO | Admitting: Oncology

## 2011-05-31 DIAGNOSIS — D649 Anemia, unspecified: Secondary | ICD-10-CM

## 2011-05-31 LAB — CBC WITH DIFFERENTIAL/PLATELET
BASO%: 0.4 % (ref 0.0–2.0)
Basophils Absolute: 0 10*3/uL (ref 0.0–0.1)
HCT: 34.2 % — ABNORMAL LOW (ref 38.4–49.9)
HGB: 11.6 g/dL — ABNORMAL LOW (ref 13.0–17.1)
LYMPH%: 38.2 % (ref 14.0–49.0)
MCH: 29 pg (ref 27.2–33.4)
MCHC: 34 g/dL (ref 32.0–36.0)
MONO#: 0.5 10*3/uL (ref 0.1–0.9)
NEUT%: 44.6 % (ref 39.0–75.0)
Platelets: 239 10*3/uL (ref 140–400)
WBC: 7 10*3/uL (ref 4.0–10.3)

## 2011-06-28 ENCOUNTER — Encounter (HOSPITAL_BASED_OUTPATIENT_CLINIC_OR_DEPARTMENT_OTHER): Payer: BC Managed Care – PPO | Admitting: Oncology

## 2011-06-28 ENCOUNTER — Other Ambulatory Visit (HOSPITAL_COMMUNITY): Payer: Self-pay | Admitting: Oncology

## 2011-06-28 DIAGNOSIS — D631 Anemia in chronic kidney disease: Secondary | ICD-10-CM

## 2011-06-28 DIAGNOSIS — N039 Chronic nephritic syndrome with unspecified morphologic changes: Secondary | ICD-10-CM

## 2011-06-28 DIAGNOSIS — N289 Disorder of kidney and ureter, unspecified: Secondary | ICD-10-CM

## 2011-06-28 DIAGNOSIS — D649 Anemia, unspecified: Secondary | ICD-10-CM

## 2011-06-28 DIAGNOSIS — N189 Chronic kidney disease, unspecified: Secondary | ICD-10-CM

## 2011-06-28 LAB — COMPREHENSIVE METABOLIC PANEL
ALT: 36 U/L (ref 0–53)
AST: 32 U/L (ref 0–37)
Alkaline Phosphatase: 59 U/L (ref 39–117)
BUN: 23 mg/dL (ref 6–23)
Chloride: 109 mEq/L (ref 96–112)
Creatinine, Ser: 1.52 mg/dL — ABNORMAL HIGH (ref 0.50–1.35)
Total Bilirubin: 0.3 mg/dL (ref 0.3–1.2)

## 2011-06-28 LAB — CBC WITH DIFFERENTIAL/PLATELET
BASO%: 0.2 % (ref 0.0–2.0)
Basophils Absolute: 0 10*3/uL (ref 0.0–0.1)
EOS%: 7 % (ref 0.0–7.0)
HCT: 33.2 % — ABNORMAL LOW (ref 38.4–49.9)
LYMPH%: 38 % (ref 14.0–49.0)
MCH: 29.2 pg (ref 27.2–33.4)
MCHC: 34.4 g/dL (ref 32.0–36.0)
MCV: 84.9 fL (ref 79.3–98.0)
MONO%: 7.1 % (ref 0.0–14.0)
NEUT%: 47.7 % (ref 39.0–75.0)
lymph#: 2.5 10*3/uL (ref 0.9–3.3)

## 2011-06-28 LAB — LACTATE DEHYDROGENASE: LDH: 131 U/L (ref 94–250)

## 2011-08-23 ENCOUNTER — Other Ambulatory Visit (HOSPITAL_COMMUNITY): Payer: Self-pay | Admitting: Oncology

## 2011-08-23 ENCOUNTER — Encounter: Payer: BC Managed Care – PPO | Admitting: Oncology

## 2011-08-23 LAB — CBC WITH DIFFERENTIAL/PLATELET
BASO%: 0.4 % (ref 0.0–2.0)
EOS%: 9.1 % — ABNORMAL HIGH (ref 0.0–7.0)
HCT: 34.7 % — ABNORMAL LOW (ref 38.4–49.9)
MCHC: 34 g/dL (ref 32.0–36.0)
MONO#: 0.5 10*3/uL (ref 0.1–0.9)
NEUT%: 45.4 % (ref 39.0–75.0)
RBC: 4.03 10*6/uL — ABNORMAL LOW (ref 4.20–5.82)
RDW: 13.1 % (ref 11.0–14.6)
WBC: 7.2 10*3/uL (ref 4.0–10.3)
lymph#: 2.7 10*3/uL (ref 0.9–3.3)

## 2011-09-01 IMAGING — US US SCROTUM
1 series · 14 of 25 positions shown · non-contrast
Comparison: None.

CLINICAL DATA: Prominence of the right scrotum

ULTRASOUND OF SCROTUM
TECHNIQUE: Complete ultrasound examination of the testicles,
epididymis, and other scrotal structures was performed.

[Series 1: us scrotum · 0.10mm/px · 14 of 65 slices shown]
[im 1/65]
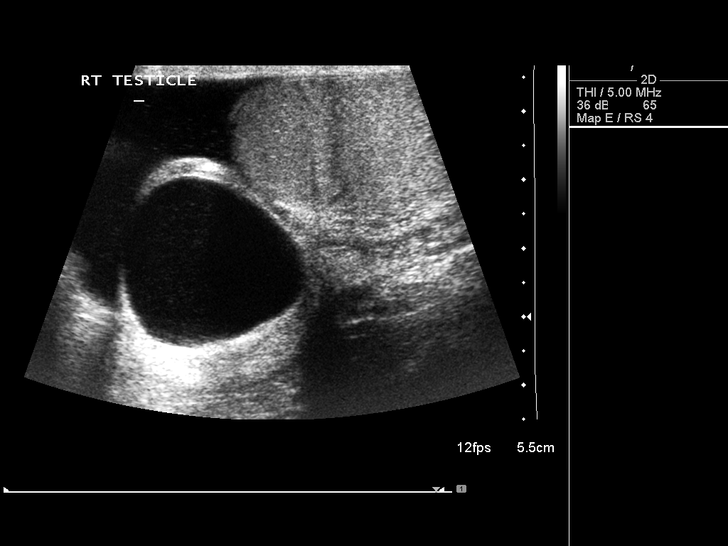
[im 6/65]
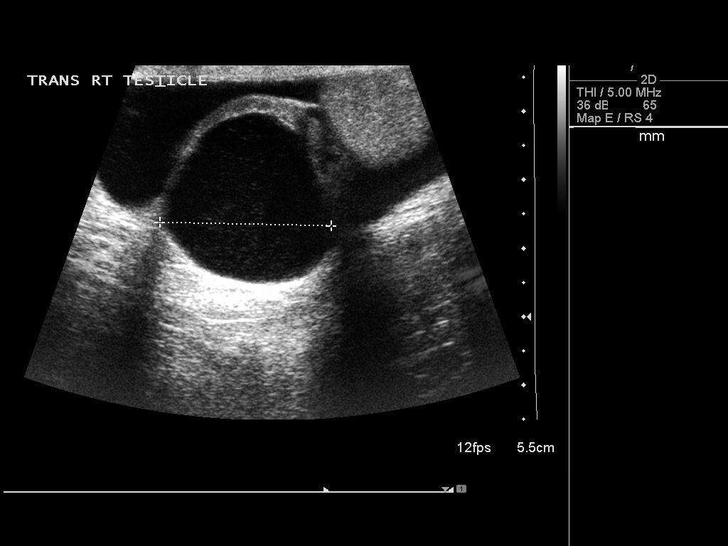
[im 11/65]
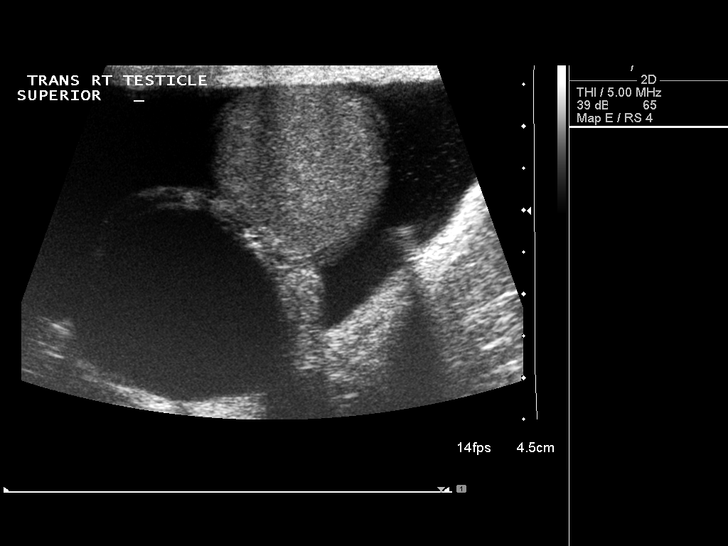
[im 17/65]
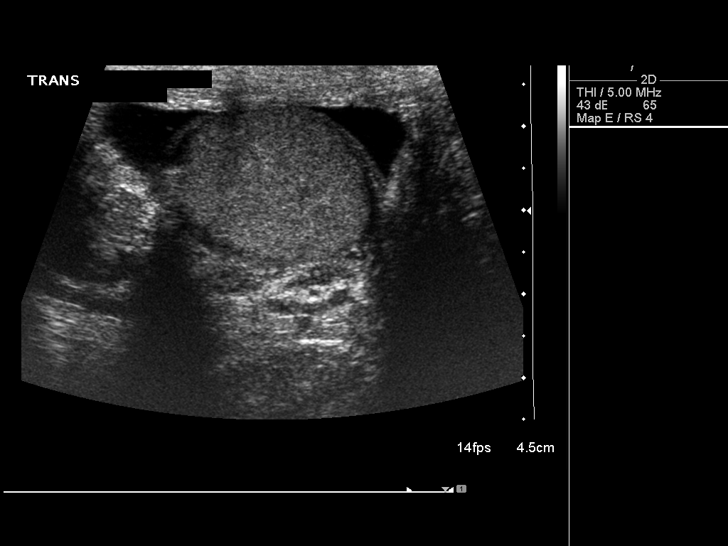
[im 22/65]
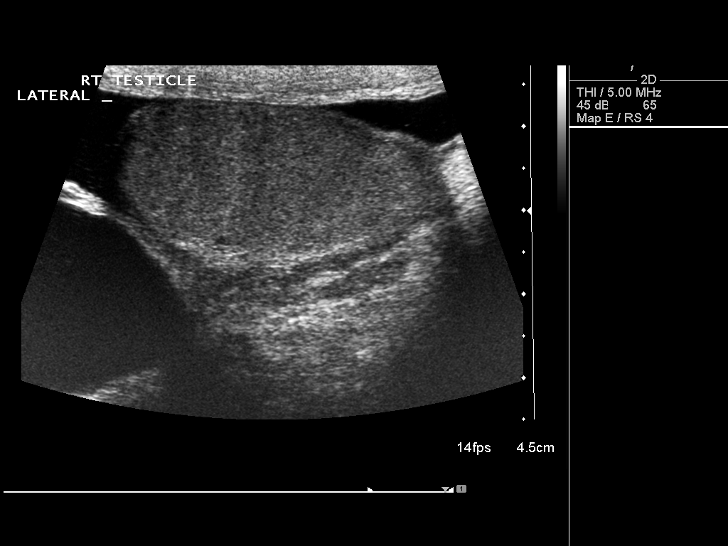
[im 25/65]
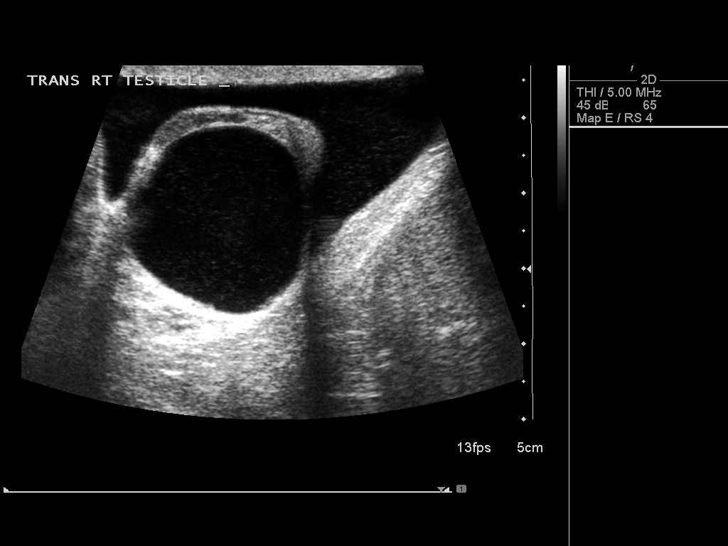
[im 30/65]
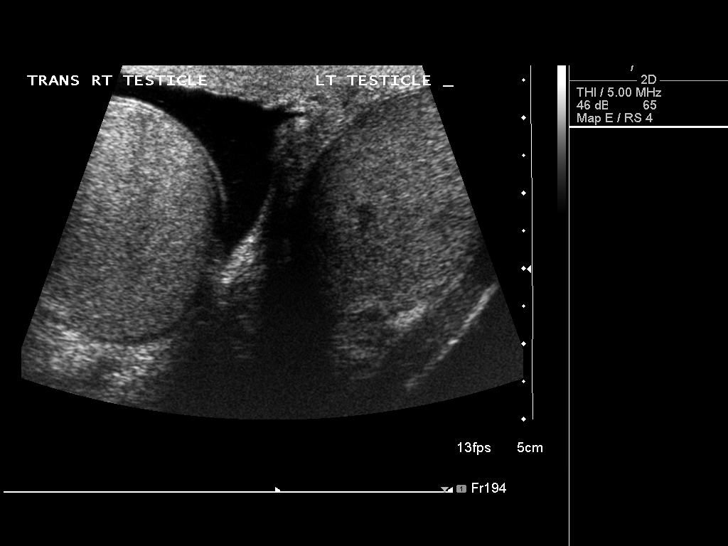
[im 35/65]
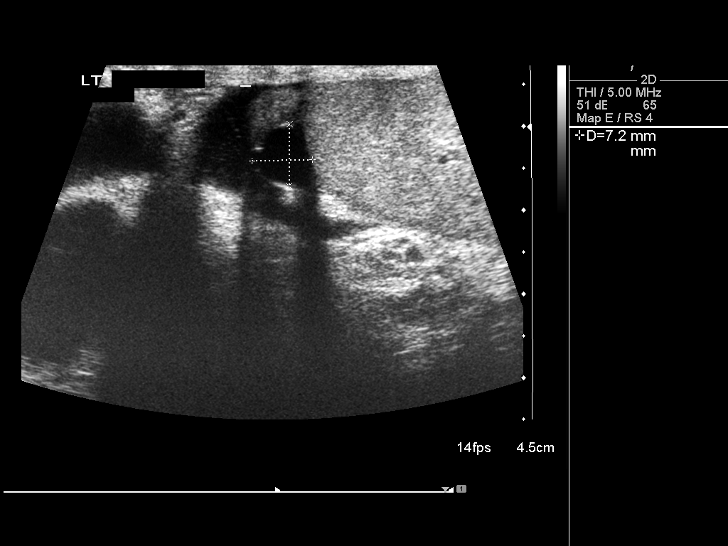
[im 41/65]
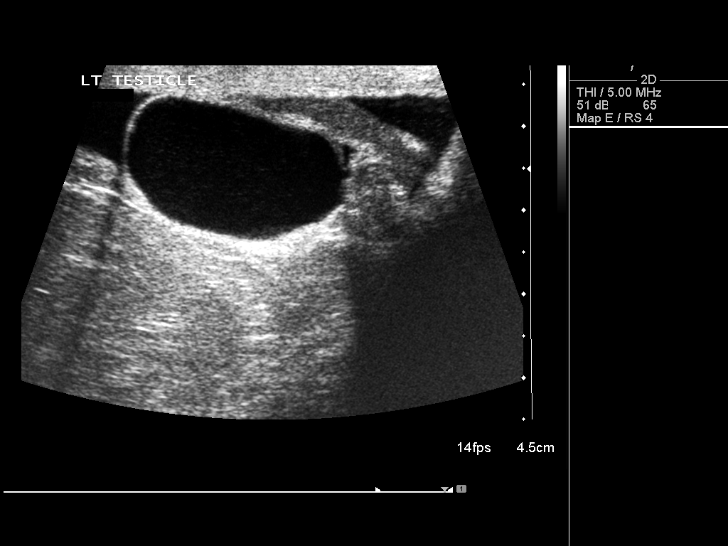
[im 43/65]
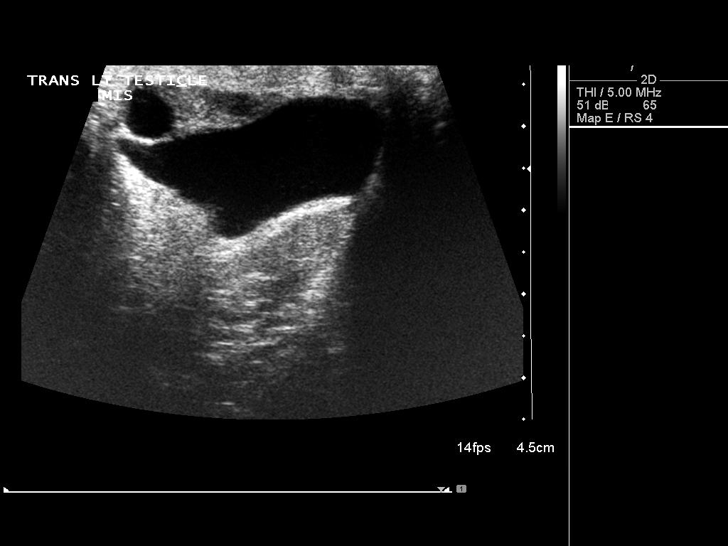
[im 49/65]
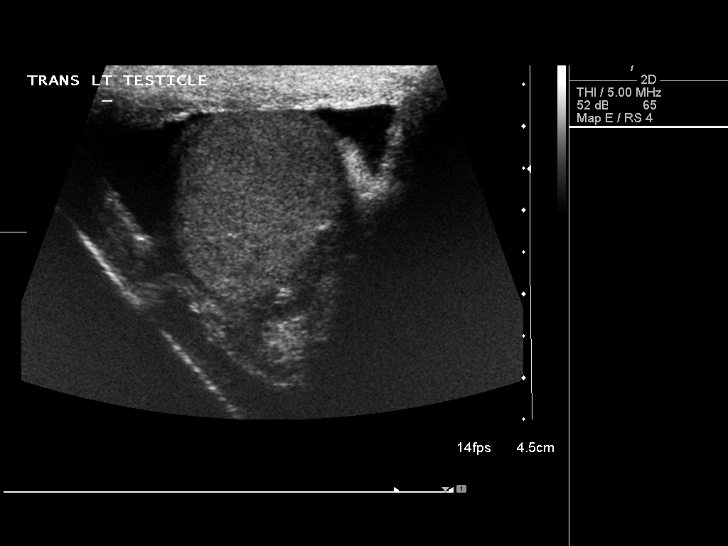
[im 54/65]
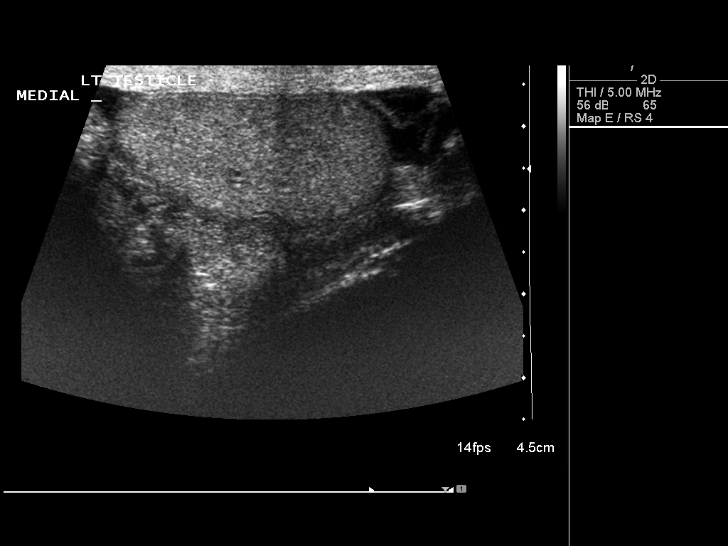
[im 59/65]
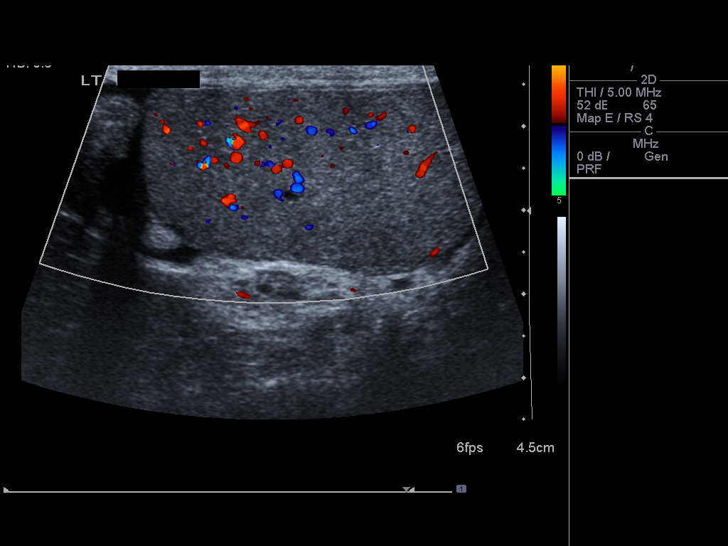
[im 65/65]
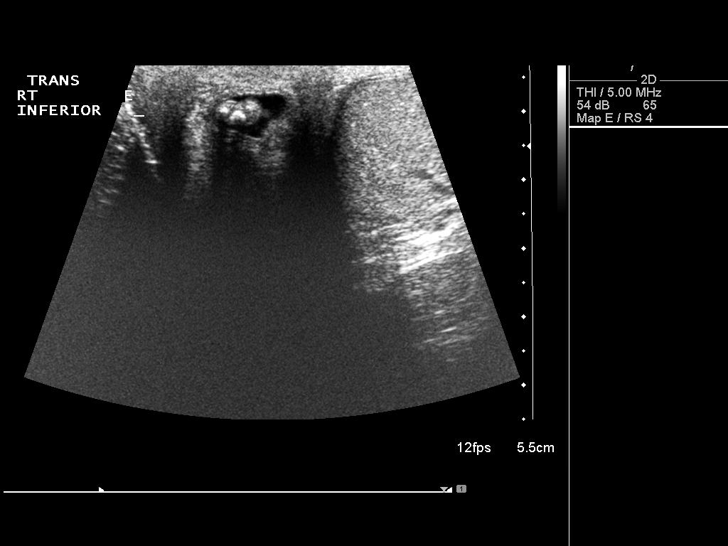

[14 of 25 positions shown; findings below may reference images not displayed]

FINDINGS: Right testis:  The right testicle is normal in size and in
echogenicity.  There is blood flow to the right testicle.  No
intratesticular abnormality is seen.

Left testis:  The left testicle is normal in size and in
echogenicity, and there is blood flow to the left testicle.

Right epididymis:  There are right epididymal cysts present.  The
largest measures 2.7 x 2.5 x 2.5 cm.

Left epididymis:  Left epididymal cysts are present, with the
largest measuring 2.7 x 1.5 x 2.3 cm.

Hydrocele:  Hydroceles are noted right greater than left.

Varicocele:  No varicocele is seen.

Probable small scrotal pearls are noted inferiorly on the right.
IMPRESSION: 1.  No intratesticular abnormality.  Blood flow is noted to both
testicles.
2.  Prominent epididymal cysts bilaterally.
3.  Hydroceles right greater than left.

## 2011-10-18 ENCOUNTER — Other Ambulatory Visit: Payer: Self-pay

## 2011-10-18 ENCOUNTER — Other Ambulatory Visit (HOSPITAL_BASED_OUTPATIENT_CLINIC_OR_DEPARTMENT_OTHER): Payer: BC Managed Care – PPO | Admitting: Lab

## 2011-10-18 ENCOUNTER — Ambulatory Visit: Payer: BC Managed Care – PPO

## 2011-10-18 ENCOUNTER — Other Ambulatory Visit (HOSPITAL_COMMUNITY): Payer: Self-pay | Admitting: Oncology

## 2011-10-18 ENCOUNTER — Other Ambulatory Visit: Payer: Self-pay | Admitting: Oncology

## 2011-10-18 DIAGNOSIS — N189 Chronic kidney disease, unspecified: Secondary | ICD-10-CM

## 2011-10-18 DIAGNOSIS — D649 Anemia, unspecified: Secondary | ICD-10-CM

## 2011-10-18 DIAGNOSIS — N039 Chronic nephritic syndrome with unspecified morphologic changes: Secondary | ICD-10-CM

## 2011-10-18 DIAGNOSIS — D631 Anemia in chronic kidney disease: Secondary | ICD-10-CM | POA: Insufficient documentation

## 2011-10-18 DIAGNOSIS — N289 Disorder of kidney and ureter, unspecified: Secondary | ICD-10-CM

## 2011-10-18 LAB — CBC WITH DIFFERENTIAL/PLATELET
Basophils Absolute: 0 10*3/uL (ref 0.0–0.1)
Eosinophils Absolute: 0.4 10*3/uL (ref 0.0–0.5)
HCT: 34 % — ABNORMAL LOW (ref 38.4–49.9)
HGB: 11.4 g/dL — ABNORMAL LOW (ref 13.0–17.1)
MONO#: 0.4 10*3/uL (ref 0.1–0.9)
NEUT#: 3.6 10*3/uL (ref 1.5–6.5)
NEUT%: 50.7 % (ref 39.0–75.0)
RDW: 13.4 % (ref 11.0–14.6)
WBC: 7.1 10*3/uL (ref 4.0–10.3)
lymph#: 2.6 10*3/uL (ref 0.9–3.3)

## 2011-10-18 NOTE — Progress Notes (Unsigned)
Pt entered clinic today for possible injection of Aranesp injection. Labs where noted to be  11.4 on Hemaglobin.  Pt advised that injection was not needed  For Hemaglobin of 11.4. Pt advised to  Keep follow up appointment for lab and MD.

## 2011-11-24 ENCOUNTER — Telehealth: Payer: Self-pay | Admitting: Oncology

## 2011-11-24 NOTE — Telephone Encounter (Signed)
l/m with appt info and mailed to pt   aom    per mosaiq

## 2012-01-03 ENCOUNTER — Other Ambulatory Visit: Payer: BC Managed Care – PPO | Admitting: Lab

## 2012-01-03 ENCOUNTER — Telehealth: Payer: Self-pay

## 2012-01-03 ENCOUNTER — Telehealth: Payer: Self-pay | Admitting: Oncology

## 2012-01-03 ENCOUNTER — Ambulatory Visit (HOSPITAL_BASED_OUTPATIENT_CLINIC_OR_DEPARTMENT_OTHER): Payer: BC Managed Care – PPO | Admitting: Physician Assistant

## 2012-01-03 ENCOUNTER — Encounter: Payer: Self-pay | Admitting: Physician Assistant

## 2012-01-03 DIAGNOSIS — D631 Anemia in chronic kidney disease: Secondary | ICD-10-CM

## 2012-01-03 DIAGNOSIS — N189 Chronic kidney disease, unspecified: Secondary | ICD-10-CM

## 2012-01-03 DIAGNOSIS — D649 Anemia, unspecified: Secondary | ICD-10-CM

## 2012-01-03 LAB — CBC WITH DIFFERENTIAL/PLATELET
Basophils Absolute: 0 10*3/uL (ref 0.0–0.1)
EOS%: 8.2 % — ABNORMAL HIGH (ref 0.0–7.0)
Eosinophils Absolute: 0.6 10*3/uL — ABNORMAL HIGH (ref 0.0–0.5)
HCT: 31.8 % — ABNORMAL LOW (ref 38.4–49.9)
HGB: 10.9 g/dL — ABNORMAL LOW (ref 13.0–17.1)
MCH: 29 pg (ref 27.2–33.4)
MCV: 84.1 fL (ref 79.3–98.0)
MONO%: 6.8 % (ref 0.0–14.0)
NEUT#: 3.2 10*3/uL (ref 1.5–6.5)
NEUT%: 46.2 % (ref 39.0–75.0)
RDW: 13.3 % (ref 11.0–14.6)

## 2012-01-03 LAB — IRON AND TIBC
%SAT: 12 % — ABNORMAL LOW (ref 20–55)
UIBC: 351 ug/dL (ref 125–400)

## 2012-01-03 LAB — COMPREHENSIVE METABOLIC PANEL
ALT: 45 U/L (ref 0–53)
AST: 56 U/L — ABNORMAL HIGH (ref 0–37)
Albumin: 4.1 g/dL (ref 3.5–5.2)
Alkaline Phosphatase: 55 U/L (ref 39–117)
Glucose, Bld: 209 mg/dL — ABNORMAL HIGH (ref 70–99)
Potassium: 4.2 mEq/L (ref 3.5–5.3)
Sodium: 142 mEq/L (ref 135–145)
Total Protein: 6.7 g/dL (ref 6.0–8.3)

## 2012-01-03 LAB — FERRITIN: Ferritin: 247 ng/mL (ref 22–322)

## 2012-01-03 MED ORDER — DARBEPOETIN ALFA-POLYSORBATE 500 MCG/ML IJ SOLN
300.0000 ug | Freq: Once | INTRAMUSCULAR | Status: AC
  Administered 2012-01-03: 300 ug via SUBCUTANEOUS
  Filled 2012-01-03: qty 1

## 2012-01-03 NOTE — Progress Notes (Signed)
[facility] OFFICE PROGRESS NOTE  CC: [name], C.N.M.  [name], M.D.   HISTORY:  [name] was seen today for followup of his anemia felt to be primarily due to renal insufficiency.  Bone marrow aspirate and biopsy carried out on 07/22/2004 was essentially normal.  His cytogenetics were negative.  There were abundant iron stores.  Serum iron parameters at that time were marginal.  [name] received some intravenous iron back on 03/07/2006.  The patient has been receiving Aranesp whenever his hemoglobin/hematocrit dropped below baseline levels.  His last Aranesp was on 10/17/2010. He admits to feeling more tired than before, mild dyspnea after walking a few blocks.  For the past 8 or 9 months his hemoglobin has been maintained above 11. Today, his Hb is 10.9. He denies any chest pain, nausea, vomiting or any other GI symptoms. His neuropathy is stable. He is able to ambulate without difficulty. No memory problems. He has had some increase in his blood pressure, well managed by [name].    MEDICAL HISTORY: Past Medical History  Diagnosis Date  . Anemia   . Renal insufficiency   . Hypertension   . Gout   . Diabetes mellitus   . Hypercholesterolemia   . Diverticulosis   . Sleep apnea   . History of nonmelanoma skin cancer     SURGICAL HISTORY:  Past Surgical History  Procedure Date  . Total hip arthroplasty   . Mohs surgery     MEDICATIONS: Current Outpatient Prescriptions  Medication Sig Dispense Refill  . amitriptyline (ELAVIL) 50 MG tablet       . amLODipine (NORVASC) 5 MG tablet       . CARAC 0.5 % cream       . GLIPIZIDE XL 10 MG 24 hr tablet       . labetalol (NORMODYNE) 300 MG tablet       . ONE TOUCH ULTRA TEST test strip       . TRILIPIX 135 MG capsule        Current Facility-Administered Medications  Medication Dose Route Frequency Provider Last Rate Last Dose  . darbepoetin alfa-polysorbate (ARANESP) injection 300 mcg  300 mcg  Subcutaneous Once Samul Dada, MD        ALLERGIES:   has no allergies on file.  REVIEW OF SYSTEMS:  The rest of the 14-point review of system was negative.   Filed Vitals:   01/03/12 1022  BP: 142/72  Pulse: 83  Temp: 97.2 F (36.2 C)   Wt Readings from Last 3 Encounters:  01/03/12 235 lb 9.6 oz (106.867 kg)   ECOG Performance status:   PHYSICAL EXAMINATION:  [name] looks well, generally unchanged, somewhat overweight. HEENT:  There is no scleral icterus.  Mouth and pharynx benign.  Scars on his face have healed extremely well.  No peripheral adenopathy palpable.  Heart/Lungs:  Normal.  Abdomen:  With patient sitting is obese, nontender with no organomegaly or masses palpable.  Extremities:  No peripheral edema or clubbing.     LABORATORY/RADIOLOGY DATA:   Lab 01/03/12 0959  WBC 7.0  HGB 10.9*  HCT 31.8*  PLT 215  MCV 84.1  MCH 29.0  MCHC 34.4  RDW 13.3  LYMPHSABS 2.7  MONOABS 0.5  EOSABS 0.6*  BASOSABS 0.0  BANDABS --    CMP   No results found for this basename: NA:5,K:5,CL:5,CO2:5,GLUCOSE:5,BUN:5,CREATININE:5,GFRCGP,:5,CALCIUM:5,MG:5,AST:5,ALT:5,ALKPHOS:5,BILITOT:5 in the last 168 hours      Component Value Date/Time   BILITOT 0.3  06/28/2011 1121     Radiology Studies:  No results found.     ASSESSMENT AND PLAN:   [name] is doing well at the present time.  He has not needed Aranesp now since October 17, 2010.He is going to receive Aranes today for his Hb is 10.9 and he is somewhat symptomatic. Thus, He will get Aranesp 300 mcg sq today. He will return for a follow up appt with Dr. Arline Asp in about one month, at which time his CBC with diff and chemistries will be checked. Aranesp to be given whenever the hemoglobin is less than or equal to 11.  [name] knows to call us should he feel unusually tired or experiences shortness of breath which may indicate a drop in his hemoglobin and hematocrit.

## 2012-01-03 NOTE — Telephone Encounter (Signed)
lvm for name of renal doctor. I faxed labs to Lupita Raider MD and want to fax to renal MD

## 2012-01-03 NOTE — Telephone Encounter (Signed)
appt made and printed for 4/4   aom

## 2012-01-08 ENCOUNTER — Encounter: Payer: Self-pay | Admitting: Medical Oncology

## 2012-01-08 NOTE — Progress Notes (Signed)
Need to review future labs for pt's next visit.

## 2012-02-20 ENCOUNTER — Other Ambulatory Visit: Payer: Self-pay | Admitting: *Deleted

## 2012-02-21 ENCOUNTER — Encounter: Payer: Self-pay | Admitting: Oncology

## 2012-02-21 ENCOUNTER — Other Ambulatory Visit (HOSPITAL_BASED_OUTPATIENT_CLINIC_OR_DEPARTMENT_OTHER): Payer: BC Managed Care – PPO | Admitting: Lab

## 2012-02-21 ENCOUNTER — Ambulatory Visit (HOSPITAL_BASED_OUTPATIENT_CLINIC_OR_DEPARTMENT_OTHER): Payer: BC Managed Care – PPO | Admitting: Oncology

## 2012-02-21 VITALS — BP 172/85 | HR 75 | Temp 97.4°F | Ht 70.0 in | Wt 234.7 lb

## 2012-02-21 DIAGNOSIS — D649 Anemia, unspecified: Secondary | ICD-10-CM

## 2012-02-21 DIAGNOSIS — N189 Chronic kidney disease, unspecified: Secondary | ICD-10-CM

## 2012-02-21 DIAGNOSIS — N039 Chronic nephritic syndrome with unspecified morphologic changes: Secondary | ICD-10-CM

## 2012-02-21 DIAGNOSIS — E1142 Type 2 diabetes mellitus with diabetic polyneuropathy: Secondary | ICD-10-CM

## 2012-02-21 DIAGNOSIS — E1149 Type 2 diabetes mellitus with other diabetic neurological complication: Secondary | ICD-10-CM

## 2012-02-21 DIAGNOSIS — N289 Disorder of kidney and ureter, unspecified: Secondary | ICD-10-CM

## 2012-02-21 LAB — CBC WITH DIFFERENTIAL/PLATELET
Basophils Absolute: 0 10*3/uL (ref 0.0–0.1)
EOS%: 10.1 % — ABNORMAL HIGH (ref 0.0–7.0)
HGB: 11.3 g/dL — ABNORMAL LOW (ref 13.0–17.1)
MCH: 27.4 pg (ref 27.2–33.4)
MONO%: 7.1 % (ref 0.0–14.0)
NEUT#: 2.9 10*3/uL (ref 1.5–6.5)
RBC: 4.14 10*6/uL — ABNORMAL LOW (ref 4.20–5.82)
RDW: 13.6 % (ref 11.0–14.6)
lymph#: 2.2 10*3/uL (ref 0.9–3.3)
nRBC: 0 % (ref 0–0)

## 2012-02-21 LAB — COMPREHENSIVE METABOLIC PANEL
ALT: 51 U/L (ref 0–53)
AST: 68 U/L — ABNORMAL HIGH (ref 0–37)
Albumin: 3.7 g/dL (ref 3.5–5.2)
Alkaline Phosphatase: 65 U/L (ref 39–117)
Glucose, Bld: 447 mg/dL — ABNORMAL HIGH (ref 70–99)
Potassium: 3.8 mEq/L (ref 3.5–5.3)
Sodium: 138 mEq/L (ref 135–145)
Total Bilirubin: 0.2 mg/dL — ABNORMAL LOW (ref 0.3–1.2)
Total Protein: 6.8 g/dL (ref 6.0–8.3)

## 2012-02-21 NOTE — Progress Notes (Signed)
CC:   [name], M.D. [name], M.D. Vincent Jordan, MD  PROBLEM LIST: 1. Anemia felt to be secondary to renal insufficiency, presenting in     May 2004.  Bone marrow carried out on 08/11/2004 showed abundant     iron stores with negative cytogenetics.  The patient receives     Aranesp as needed for hemoglobin less than or equal to 11. 2. Renal insufficiency. 3. Hypertension. 4. Diabetes mellitus with diabetic neuropathy. 5. Dyslipidemia. 6. Gout. 7. Diverticulosis. 8. Sleep apnea. 9. History of kidney stones on the right side treated with     lithotripsy. 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 11.History of skin cancers involving the face treated with Moh's     surgery in May 2012.  MEDICATIONS: 1. Amitriptyline 50 mg at bedtime. 2. Norvasc 10 mg daily. 3. Aspirin 81 mg daily. 4. Colchicine 0.6 mg daily. 5. Vytorin 10-40 one at bedtime. 6. Uloric 40 mg daily. 7. Glipizide XL 10 mg daily. 8. Lantus insulin 32 units subcutaneous every morning. 9. Labetalol 300 mg twice daily. 10.Accupril 40 mg twice daily. 11.Trilipix 135 mg at bedtime. 12.Vitamin E 100 units by mouth daily.  HISTORY:  I saw [name] today for followup of his anemia felt to be secondary to renal insufficiency.  Diagnosis goes back to May of 2004. The patient was last seen by [place] on 01/03/2012 at which time, his hemoglobin was 10.9, and he received Aranesp 300 mcg subcutaneous. Prior to that he had received Aranesp on 10/17/2010.  We have recently been following [name] with CBCs every couple of months, and he has done quite well.  He denies any obvious blood in his stools.  He is seeing Dr. Briant Cedar recently for evaluation of his renal insufficiency.  He is without complaints today.  PHYSICAL EXAM:  General:  There is no obvious change.  He looks well. He is 40, soon to be 89 on June 19th.  Vital signs: Weight 234.7 pounds which is stable.  Height 5 feet 10 inches, body surface  area 2.29 meters squared.  Blood pressure today 172/85.  The patient is aware of his elevated blood pressure which is being worked on by Dr. Briant Cedar. Other vital signs are normal.  There is no scleral icterus.  Mouth and pharynx are benign.  No peripheral adenopathy palpable.  Heart and lungs:  Normal.  Abdomen: Without organomegaly or masses.  Abdomen: Somewhat obese.  There is an umbilical hernia.  Extremities:  No peripheral edema or clubbing.  Neurologic:  Nonfocal.  LABORATORY DATA:  Today, white count 6.3, ANC 2.9, hemoglobin 11.3, hematocrit 35.0, platelets 180,000.  Chemistries:  Notable for a glucose of 447, BUN 21, creatinine 1.52, AST of 68, which was 56 on 02/14, but normal at 32 on 08/09.  His ALT was 51, albumin 3.7, LDH 158.  On 01/03/2012 ferritin was 247, iron saturation 12%.  Ferritin values going back to 2007 have been consistently in the 200-500 range.  IMAGING STUDIES: 1. CT scans of abdomen and pelvis with IV contrast on 09/06/2004 were     negative. 2. Renal/urinary tract ultrasound on 12/14/2009 was unremarkable. 3. Ultrasound of the scrotum on 11/24/2010 showed hydroceles right     greater than left.  There was a prominent epididymal cysts     bilaterally.  No intratesticular abnormality was noted.  No     varicocele was seen.  IMPRESSION AND PLAN:  The patient continues to do well.  He does not need Aranesp  today since his hemoglobin was 11.3.  We will check CBCs every 2 months and give Aranesp 300 mcg subcutaneous whenever the hemoglobin is less than or equal to 11.  We will plan to see [name] again in 6 months, around September 19th at which time we will check CBC and chemistries.    ______________________________ Samul Dada, M.D. DSM/MEDQ  D:  02/21/2012  T:  02/21/2012  Job:  [id number]

## 2012-02-21 NOTE — Progress Notes (Signed)
This office note has been dictated.  #[id number]

## 2012-02-22 ENCOUNTER — Telehealth: Payer: Self-pay | Admitting: Oncology

## 2012-02-22 NOTE — Telephone Encounter (Signed)
s/w and he is aware of appts  aom

## 2012-04-23 ENCOUNTER — Other Ambulatory Visit: Payer: Self-pay | Admitting: Medical Oncology

## 2012-04-23 DIAGNOSIS — N189 Chronic kidney disease, unspecified: Secondary | ICD-10-CM

## 2012-04-24 ENCOUNTER — Other Ambulatory Visit (HOSPITAL_BASED_OUTPATIENT_CLINIC_OR_DEPARTMENT_OTHER): Payer: BC Managed Care – PPO | Admitting: Lab

## 2012-04-24 ENCOUNTER — Ambulatory Visit (HOSPITAL_BASED_OUTPATIENT_CLINIC_OR_DEPARTMENT_OTHER): Payer: BC Managed Care – PPO

## 2012-04-24 VITALS — BP 105/58 | HR 85 | Temp 98.9°F

## 2012-04-24 DIAGNOSIS — D631 Anemia in chronic kidney disease: Secondary | ICD-10-CM

## 2012-04-24 DIAGNOSIS — N189 Chronic kidney disease, unspecified: Secondary | ICD-10-CM

## 2012-04-24 DIAGNOSIS — N039 Chronic nephritic syndrome with unspecified morphologic changes: Secondary | ICD-10-CM

## 2012-04-24 LAB — CBC WITH DIFFERENTIAL/PLATELET
BASO%: 0.5 % (ref 0.0–2.0)
EOS%: 8.9 % — ABNORMAL HIGH (ref 0.0–7.0)
HCT: 32.2 % — ABNORMAL LOW (ref 38.4–49.9)
LYMPH%: 39.2 % (ref 14.0–49.0)
MCH: 28.4 pg (ref 27.2–33.4)
MCHC: 33.8 g/dL (ref 32.0–36.0)
NEUT%: 43.8 % (ref 39.0–75.0)
Platelets: 197 10*3/uL (ref 140–400)

## 2012-04-24 MED ORDER — DARBEPOETIN ALFA-POLYSORBATE 300 MCG/0.6ML IJ SOLN
300.0000 ug | Freq: Once | INTRAMUSCULAR | Status: AC
  Administered 2012-04-24: 300 ug via SUBCUTANEOUS
  Filled 2012-04-24: qty 0.6

## 2012-06-26 ENCOUNTER — Other Ambulatory Visit: Payer: Self-pay | Admitting: Oncology

## 2012-06-26 ENCOUNTER — Other Ambulatory Visit: Payer: BC Managed Care – PPO | Admitting: Lab

## 2012-06-26 ENCOUNTER — Ambulatory Visit: Payer: BC Managed Care – PPO

## 2012-06-26 DIAGNOSIS — N189 Chronic kidney disease, unspecified: Secondary | ICD-10-CM

## 2012-06-26 DIAGNOSIS — D649 Anemia, unspecified: Secondary | ICD-10-CM

## 2012-06-26 DIAGNOSIS — D631 Anemia in chronic kidney disease: Secondary | ICD-10-CM

## 2012-06-26 LAB — CBC WITH DIFFERENTIAL/PLATELET
Basophils Absolute: 0 10*3/uL (ref 0.0–0.1)
Eosinophils Absolute: 0.5 10*3/uL (ref 0.0–0.5)
HGB: 11 g/dL — ABNORMAL LOW (ref 13.0–17.1)
LYMPH%: 28 % (ref 14.0–49.0)
MCV: 85.5 fL (ref 79.3–98.0)
MONO#: 0.6 10*3/uL (ref 0.1–0.9)
MONO%: 7.6 % (ref 0.0–14.0)
NEUT#: 4.4 10*3/uL (ref 1.5–6.5)
Platelets: 203 10*3/uL (ref 140–400)
RDW: 14.1 % (ref 11.0–14.6)

## 2012-06-26 LAB — COMPREHENSIVE METABOLIC PANEL
Albumin: 4 g/dL (ref 3.5–5.2)
Alkaline Phosphatase: 52 U/L (ref 39–117)
BUN: 22 mg/dL (ref 6–23)
CO2: 20 mEq/L (ref 19–32)
Glucose, Bld: 170 mg/dL — ABNORMAL HIGH (ref 70–99)
Potassium: 4.1 mEq/L (ref 3.5–5.3)

## 2012-06-26 LAB — LACTATE DEHYDROGENASE: LDH: 124 U/L (ref 94–250)

## 2012-06-26 MED ORDER — DARBEPOETIN ALFA-POLYSORBATE 500 MCG/ML IJ SOLN: 300.0000 ug | Freq: Once | INTRAMUSCULAR | Status: DC

## 2012-08-21 ENCOUNTER — Encounter: Payer: Self-pay | Admitting: Oncology

## 2012-08-21 ENCOUNTER — Ambulatory Visit (HOSPITAL_BASED_OUTPATIENT_CLINIC_OR_DEPARTMENT_OTHER): Payer: BC Managed Care – PPO | Admitting: Oncology

## 2012-08-21 ENCOUNTER — Other Ambulatory Visit: Payer: Self-pay

## 2012-08-21 ENCOUNTER — Telehealth: Payer: Self-pay | Admitting: Oncology

## 2012-08-21 ENCOUNTER — Other Ambulatory Visit (HOSPITAL_BASED_OUTPATIENT_CLINIC_OR_DEPARTMENT_OTHER): Payer: BC Managed Care – PPO | Admitting: Lab

## 2012-08-21 ENCOUNTER — Ambulatory Visit: Payer: BC Managed Care – PPO

## 2012-08-21 VITALS — BP 138/78 | HR 87 | Temp 97.9°F | Resp 20 | Ht 70.0 in | Wt 230.4 lb

## 2012-08-21 DIAGNOSIS — I10 Essential (primary) hypertension: Secondary | ICD-10-CM

## 2012-08-21 DIAGNOSIS — N189 Chronic kidney disease, unspecified: Secondary | ICD-10-CM

## 2012-08-21 DIAGNOSIS — Z85828 Personal history of other malignant neoplasm of skin: Secondary | ICD-10-CM

## 2012-08-21 DIAGNOSIS — D631 Anemia in chronic kidney disease: Secondary | ICD-10-CM

## 2012-08-21 DIAGNOSIS — N289 Disorder of kidney and ureter, unspecified: Secondary | ICD-10-CM

## 2012-08-21 DIAGNOSIS — D649 Anemia, unspecified: Secondary | ICD-10-CM

## 2012-08-21 LAB — COMPREHENSIVE METABOLIC PANEL (CC13)
ALT: 36 U/L (ref 0–55)
Albumin: 3.8 g/dL (ref 3.5–5.0)
CO2: 20 mEq/L — ABNORMAL LOW (ref 22–29)
Chloride: 112 mEq/L — ABNORMAL HIGH (ref 98–107)
Glucose: 107 mg/dl — ABNORMAL HIGH (ref 70–99)
Potassium: 3.9 mEq/L (ref 3.5–5.1)
Sodium: 142 mEq/L (ref 136–145)
Total Protein: 6.8 g/dL (ref 6.4–8.3)

## 2012-08-21 LAB — CBC WITH DIFFERENTIAL/PLATELET
BASO%: 0.3 % (ref 0.0–2.0)
Eosinophils Absolute: 0.5 10*3/uL (ref 0.0–0.5)
MCHC: 33.7 g/dL (ref 32.0–36.0)
MONO#: 0.5 10*3/uL (ref 0.1–0.9)
NEUT#: 3.4 10*3/uL (ref 1.5–6.5)
Platelets: 223 10*3/uL (ref 140–400)
RBC: 3.93 10*6/uL — ABNORMAL LOW (ref 4.20–5.82)
RDW: 13.8 % (ref 11.0–14.6)
WBC: 7.5 10*3/uL (ref 4.0–10.3)
lymph#: 3.1 10*3/uL (ref 0.9–3.3)

## 2012-08-21 LAB — LACTATE DEHYDROGENASE (CC13): LDH: 151 U/L (ref 125–220)

## 2012-08-21 MED ORDER — DARBEPOETIN ALFA-POLYSORBATE 500 MCG/ML IJ SOLN: 300.0000 ug | Freq: Once | INTRAMUSCULAR | Status: DC

## 2012-08-21 NOTE — Progress Notes (Signed)
CC:   [name], M.D. Dyke Maes, M.D. Zenovia Jordan, MD   PROBLEM LIST:  1. Anemia felt to be secondary to renal insufficiency, presenting in  May 2004. Bone marrow carried out on 08/11/2004 showed abundant  iron stores with negative cytogenetics. The patient receives  Aranesp as needed for hemoglobin less than or equal to 11.  2. Renal insufficiency.  3. Hypertension.  4. Diabetes mellitus with diabetic neuropathy.  5. Dyslipidemia.  6. Gout.  7. Diverticulosis.  8. Sleep apnea.  9. History of kidney stones on the right side treated with  lithotripsy.  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  11.History of skin cancers involving the face treated with Moh's  surgery in May 2012.    MEDICATIONS:  1. Amitriptyline 50 mg at bedtime.  2. Norvasc 10 mg daily.  3. Aspirin 81 mg daily.  4. Colchicine 0.6 mg daily.  5. Vytorin 10-40 one at bedtime.  6. Uloric 40 mg daily.  7. Glipizide XL 10 mg daily.  8. Lantus insulin 34 units subcutaneous every morning.  9. Labetalol 450 mg in the morning, 300 mg in the evening.  10.Accupril 40 mg twice daily.  11.Trilipix 135 mg at bedtime.  12.Vitamin E 100 units by mouth daily. 13.Insulin injection every week. 14.Aranesp 300 mcg subcu every 3 months as needed for hemoglobin less     than or equal to 11.  SMOKING HISTORY:  The patient has never smoked cigarettes.  HISTORY:  [name] was seen today for followup of his anemia, felt to be secondary to renal insufficiency.  Diagnosis goes back to May 2004. [name] was last seen by [place] on 02/21/2012.  We had been checking his CBC every 2 months.  On 04/24/2012, hemoglobin was 10.9 and the patient received Aranesp 300 mcg subcu.  On 06/26/2012, hemoglobin was 11.0 and today it is 11.2.  The patient seems to be getting along fairly well. He does note some improvement in his energy several days after receiving Aranesp.  Clearly he has not needed much Aranesp in  recent months.  The patient denies any obvious blood in his stools.  He has not had any medical problems over the past 6 months.  He is without any complaints today.  He works in the [employer].  PHYSICAL EXAMINATION:  General:  There is no obvious change.  The patient looks well.  He is now 38.  Vital Signs:  Weight is 230.4 pounds, height 5 feet 10 inches, body surface area 2.27 sq m.  Blood pressure 138/78.  Other vital signs are normal.  HEENT:  There is no scleral icterus.  Mouth and pharynx are benign.  No peripheral adenopathy palpable.  Heart and Lungs:  Normal.  Abdomen:  Somewhat obese, nontender with no organomegaly or masses palpable.  There is an umbilical hernia.  Extremities:  No peripheral edema or clubbing. Neurologic Exam:  Nonfocal.  LABORATORY DATA:  Today white count 7.5, ANC 3.4, hemoglobin 11.2, hematocrit 32.3, platelets 223,000.  Chemistries today notable for a BUN of 28, creatinine 1.7.  Renal function is fairly stable with some minor fluctuations.  Albumin 3.8.  On 01/03/2012, ferritin was 247 and iron saturation 12%.  IMAGING STUDIES:  1. CT scans of abdomen and pelvis with IV contrast on 09/06/2004 were  negative.  2. Renal/urinary tract ultrasound on 12/14/2009 was unremarkable.  3. Ultrasound of the scrotum on 11/24/2010 showed hydroceles right  greater than left. There was a prominent epididymal cysts  bilaterally. No intratesticular abnormality was noted. No  varicocele was seen.   IMPRESSION AND PLAN:  [name] continues to do well.  In fact, he is doing so well, I think what we can do is check CBCs every 3 months with plans to give him Aranesp 300 mcg subcu whenever his hemoglobin is less than or equal to 11.  We will plan to see him again in 1 year, which will be early October 2014.  We will check CBC and chemistries at that time.   ______________________________ Samul Dada, M.D. DSM/MEDQ  D:  08/21/2012  T:  08/21/2012   Job:  [id number]

## 2012-08-21 NOTE — Patient Instructions (Signed)
CBC and possible Aranesp if hemoglobin less than or equal to 11.

## 2012-08-21 NOTE — Progress Notes (Signed)
This office note has been dictated.  #[id number]

## 2012-08-21 NOTE — Telephone Encounter (Signed)
Gave pt appt for labs every 3 months then see MD in 1 year with labs on October 2014

## 2012-09-16 ENCOUNTER — Other Ambulatory Visit: Payer: Self-pay | Admitting: Family Medicine

## 2012-09-16 ENCOUNTER — Ambulatory Visit
Admission: RE | Admit: 2012-09-16 | Discharge: 2012-09-16 | Disposition: A | Discharge status: Home / Self Care | Payer: BC Managed Care – PPO | Source: Ambulatory Visit | Attending: Family Medicine | Admitting: Family Medicine

## 2012-09-16 DIAGNOSIS — R509 Fever, unspecified: Secondary | ICD-10-CM

## 2012-09-17 ENCOUNTER — Inpatient Hospital Stay (HOSPITAL_COMMUNITY)
Admission: EM | Admit: 2012-09-17 | Discharge: 2012-09-22 | DRG: 586 | Disposition: A | Discharge status: Home / Self Care | Payer: BC Managed Care – PPO | Attending: Internal Medicine | Admitting: Internal Medicine

## 2012-09-17 ENCOUNTER — Emergency Department (HOSPITAL_COMMUNITY): Payer: BC Managed Care – PPO

## 2012-09-17 ENCOUNTER — Encounter (HOSPITAL_COMMUNITY): Payer: Self-pay

## 2012-09-17 DIAGNOSIS — G4733 Obstructive sleep apnea (adult) (pediatric): Secondary | ICD-10-CM | POA: Diagnosis present

## 2012-09-17 DIAGNOSIS — Z85820 Personal history of malignant melanoma of skin: Secondary | ICD-10-CM

## 2012-09-17 DIAGNOSIS — D649 Anemia, unspecified: Secondary | ICD-10-CM | POA: Diagnosis present

## 2012-09-17 DIAGNOSIS — R509 Fever, unspecified: Secondary | ICD-10-CM | POA: Diagnosis present

## 2012-09-17 DIAGNOSIS — E785 Hyperlipidemia, unspecified: Secondary | ICD-10-CM

## 2012-09-17 DIAGNOSIS — N179 Acute kidney failure, unspecified: Secondary | ICD-10-CM | POA: Diagnosis present

## 2012-09-17 DIAGNOSIS — N289 Disorder of kidney and ureter, unspecified: Secondary | ICD-10-CM

## 2012-09-17 DIAGNOSIS — N189 Chronic kidney disease, unspecified: Secondary | ICD-10-CM | POA: Diagnosis present

## 2012-09-17 DIAGNOSIS — K5730 Diverticulosis of large intestine without perforation or abscess without bleeding: Secondary | ICD-10-CM | POA: Diagnosis present

## 2012-09-17 DIAGNOSIS — E119 Type 2 diabetes mellitus without complications: Secondary | ICD-10-CM | POA: Diagnosis present

## 2012-09-17 DIAGNOSIS — I10 Essential (primary) hypertension: Secondary | ICD-10-CM | POA: Diagnosis present

## 2012-09-17 DIAGNOSIS — R109 Unspecified abdominal pain: Secondary | ICD-10-CM | POA: Diagnosis present

## 2012-09-17 DIAGNOSIS — D638 Anemia in other chronic diseases classified elsewhere: Secondary | ICD-10-CM | POA: Diagnosis present

## 2012-09-17 DIAGNOSIS — E7800 Pure hypercholesterolemia, unspecified: Secondary | ICD-10-CM | POA: Diagnosis present

## 2012-09-17 DIAGNOSIS — Z79899 Other long term (current) drug therapy: Secondary | ICD-10-CM

## 2012-09-17 DIAGNOSIS — D631 Anemia in chronic kidney disease: Secondary | ICD-10-CM

## 2012-09-17 DIAGNOSIS — K047 Periapical abscess without sinus: Principal | ICD-10-CM | POA: Diagnosis present

## 2012-09-17 DIAGNOSIS — E876 Hypokalemia: Secondary | ICD-10-CM | POA: Diagnosis present

## 2012-09-17 DIAGNOSIS — Z96649 Presence of unspecified artificial hip joint: Secondary | ICD-10-CM

## 2012-09-17 DIAGNOSIS — M109 Gout, unspecified: Secondary | ICD-10-CM | POA: Diagnosis present

## 2012-09-17 DIAGNOSIS — Z794 Long term (current) use of insulin: Secondary | ICD-10-CM

## 2012-09-17 LAB — COMPREHENSIVE METABOLIC PANEL
Albumin: 4.1 g/dL (ref 3.5–5.2)
BUN: 29 mg/dL — ABNORMAL HIGH (ref 6–23)
Creatinine, Ser: 2.33 mg/dL — ABNORMAL HIGH (ref 0.50–1.35)
GFR calc Af Amer: 32 mL/min — ABNORMAL LOW (ref >90)
Total Bilirubin: 0.4 mg/dL (ref 0.3–1.2)
Total Protein: 7.9 g/dL (ref 6.0–8.3)

## 2012-09-17 LAB — URINALYSIS, ROUTINE W REFLEX MICROSCOPIC
Glucose, UA: NEGATIVE mg/dL
Leukocytes, UA: NEGATIVE
Nitrite: NEGATIVE
Protein, ur: NEGATIVE mg/dL
Urobilinogen, UA: 0.2 mg/dL (ref 0.0–1.0)
pH: 5 (ref 5.0–8.0)

## 2012-09-17 LAB — LACTIC ACID, PLASMA: Lactic Acid, Venous: 1.4 mmol/L (ref 0.5–2.2)

## 2012-09-17 LAB — OCCULT BLOOD, POC DEVICE: Fecal Occult Bld: NEGATIVE

## 2012-09-17 LAB — CBC WITH DIFFERENTIAL/PLATELET
Basophils Relative: 0 % (ref 0–1)
Eosinophils Absolute: 0.2 10*3/uL (ref 0.0–0.7)
HCT: 33.6 % — ABNORMAL LOW (ref 39.0–52.0)
Hemoglobin: 11.7 g/dL — ABNORMAL LOW (ref 13.0–17.0)
MCH: 28.9 pg (ref 26.0–34.0)
MCHC: 34.8 g/dL (ref 30.0–36.0)
MCV: 83 fL (ref 78.0–100.0)
Monocytes Absolute: 1 10*3/uL (ref 0.1–1.0)
Monocytes Relative: 11 % (ref 3–12)

## 2012-09-17 LAB — TROPONIN I: Troponin I: <0.3 ng/mL (ref <0.30)

## 2012-09-17 MED ORDER — ACETAMINOPHEN 325 MG PO TABS
650.0000 mg | ORAL_TABLET | Freq: Once | ORAL | Status: AC
  Administered 2012-09-17: 650 mg via ORAL
  Filled 2012-09-17: qty 2

## 2012-09-17 MED ORDER — MORPHINE SULFATE 4 MG/ML IJ SOLN
4.0000 mg | Freq: Once | INTRAMUSCULAR | Status: AC
  Administered 2012-09-17: 4 mg via INTRAVENOUS
  Filled 2012-09-17: qty 1

## 2012-09-17 MED ORDER — SODIUM CHLORIDE 0.9 % IV SOLN
1000.0000 mL | Freq: Once | INTRAVENOUS | Status: AC
  Administered 2012-09-17: 1000 mL via INTRAVENOUS

## 2012-09-17 MED ORDER — MORPHINE SULFATE 4 MG/ML IJ SOLN
4.0000 mg | Freq: Once | INTRAMUSCULAR | Status: AC
Start: 2012-09-17 — End: 2012-09-17
  Administered 2012-09-17: 4 mg via INTRAVENOUS
  Filled 2012-09-17: qty 1

## 2012-09-17 MED ORDER — SODIUM CHLORIDE 0.9 % IV SOLN
1000.0000 mL | INTRAVENOUS | Status: DC
  Administered 2012-09-18 – 2012-09-22 (×8): 1000 mL via INTRAVENOUS

## 2012-09-17 MED ORDER — IOHEXOL 300 MG/ML  SOLN
20.0000 mL | INTRAMUSCULAR | Status: AC
  Administered 2012-09-17: 20 mL via ORAL

## 2012-09-17 NOTE — ED Notes (Signed)
IV start attempted x 2 unsuccessfully.  IV team paged.

## 2012-09-17 NOTE — ED Notes (Signed)
Pt returned from CT °

## 2012-09-17 NOTE — ED Provider Notes (Signed)
History     CSN: [id number]  Arrival date & time 09/17/12  1403   First MD Initiated Contact with Patient 09/17/12 1839      Chief Complaint  Patient presents with  . Fever  . Generalized Body Aches    (Consider location/radiation/quality/duration/timing/severity/associated sxs/prior treatment) HPI  66 year old male presents complaining of fever. Patient reports for the past 4 days he has been feeling weak, tired, having fever as high as 103, chills, generalized body aches, diffuse joint pain, and feeling like he cannot perform his daily activities. He reports several weeks ago he was having dental infection to his left upper premolar and was given antibiotic which he has finished. He endorsed some mild tenderness to his left premolar but denies any increasing pain. Does reports having a dental abscess that was lanced by his dentist last week and subsequently his sxs worsen.  No hx of valvular problem. He does endorse headache to the top of his head that has been persistent. Denies photophobia, phonophobia, or neck stiffness. He noticed that his right shoulder and right leg seems to be painful for the past several days. Pain is described as a sharp and throbbing sensation, persistent, worsening with movement. Denies any recent trauma. Patient also endorsed abdominal pain. Pain is described as an achy sensation, diffuse, improved with having bowel movement. Last bowel movement was yesterday, but only a small amount. States he did feel constipated for the past several days. Has just recently started on MiraLAX since yesterday. Did have surgical history of  Appendectomy.  Also has history of right total hip replacement. Denies any significant pain to his right hip. Denies nausea, vomiting, diarrhea, dysuria. No chest pain or shortness of breath.  Past Medical History  Diagnosis Date  . Anemia   . Renal insufficiency   . Hypertension   . Gout   . Diabetes mellitus   . Hypercholesterolemia     . Diverticulosis   . Sleep apnea   . History of nonmelanoma skin cancer     Past Surgical History  Procedure Date  . Total hip arthroplasty   . Mohs surgery     No family history on file.  History  Substance Use Topics  . Smoking status: Not on file  . Smokeless tobacco: Not on file  . Alcohol Use: Not on file      Review of Systems  All other systems reviewed and are negative.    Allergies  Review of patient's allergies indicates no known allergies.  Home Medications   Current Outpatient Rx  Name Route Sig Dispense Refill  . AMITRIPTYLINE HCL 50 MG PO TABS Oral Take 50 mg by mouth at bedtime.     Marland Kitchen AMLODIPINE BESYLATE 10 MG PO TABS Oral Take 10 mg by mouth daily.    . COLCHICINE 0.6 MG PO TABS Oral Take 0.6 mg by mouth daily.    Marland Kitchen EZETIMIBE-SIMVASTATIN 10-40 MG PO TABS Oral Take 1 tablet by mouth at bedtime.    . FEBUXOSTAT 40 MG PO TABS Oral Take 40 mg by mouth.    Marland Kitchen GLIPIZIDE XL 10 MG PO TB24 Oral Take 10 mg by mouth daily.     . INSULIN GLARGINE 100 UNIT/ML [place] SOLN Subcutaneous Inject 34 Units into the skin every morning.     Marland Kitchen LABETALOL HCL 300 MG PO TABS Oral Take 300-450 mg by mouth 2 (two) times daily. 1.5 tabs in the am, 1 tab in the pm    . NON FORMULARY  once a week. 08/21/12- An insulin injection once a week. Pt cannot remember    . QUINAPRIL HCL 40 MG PO TABS Oral Take 40 mg by mouth 2 (two) times daily.    . TRILIPIX 135 MG PO CPDR Oral Take 135 mg by mouth at bedtime.     Marland Kitchen VITAMIN E 100 UNITS PO CAPS Oral Take 100 Units by mouth daily.    Letta Pate ULTRA BLUE VI STRP        BP 90/77  Pulse 107  Temp 99.7 F (37.6 C) (Oral)  Resp 20  SpO2 95%  Physical Exam  Nursing note and vitals reviewed. Constitutional: He is oriented to person, place, and time. He appears well-developed and well-nourished. No distress.       Awake, alert, nontoxic appearance  HENT:  Head: Normocephalic and atraumatic.  Mouth/Throat: Oropharynx is clear and moist.        Mild tenderness to gum line near L upper canine.  No evidence of deep tissue infection on oral exam  Eyes: Conjunctivae normal and EOM are normal. Pupils are equal, round, and reactive to light. Right eye exhibits no discharge. Left eye exhibits no discharge.  Neck: Normal range of motion. Neck supple.       No nuchal rigidity  Cardiovascular: Normal rate and regular rhythm.  Exam reveals no gallop and no friction rub.   No murmur heard. Pulmonary/Chest: Effort normal. No respiratory distress. He exhibits no tenderness.  Abdominal: Soft. Bowel sounds are normal. He exhibits no distension. There is tenderness (Mild right lower quadrant abdominal tenderness without guarding or rebound.). There is no rebound.  Genitourinary: Rectum normal. Guaiac negative stool.       Chaperone present.  No evidence of stool impaction on exam.    Musculoskeletal: He exhibits tenderness. He exhibits no edema.       Intentional tremors with movement to extremities. No focal neuro deficit. Normal bilateral grip strength. 4/5 strength in all 4 extremities.  Normal distal pulse.  Tenderness to both knees with ROM but no evidence of infection.  Tenderness to both thigh on palpation without infection.    BLE: no palpable cords, erythema, pedal edema, neg homan's sign.    Lymphadenopathy:    He has no cervical adenopathy.  Neurological: He is alert and oriented to person, place, and time. No cranial nerve deficit.  Skin: Skin is warm and dry. No rash noted.  Psychiatric: He has a normal mood and affect.    ED Course  Procedures (including critical care time)  Labs Reviewed  CBC WITH DIFFERENTIAL - Abnormal; Notable for the following:    RBC 4.05 (*)     Hemoglobin 11.7 (*)     HCT 33.6 (*)     All other components within normal limits  COMPREHENSIVE METABOLIC PANEL - Abnormal; Notable for the following:    Sodium 134 (*)     BUN 29 (*)     Creatinine, Ser 2.33 (*)     GFR calc non Af Amer 27 (*)     GFR  calc Af Amer 32 (*)     All other components within normal limits  LACTIC ACID, PLASMA  TROPONIN I  URINALYSIS, ROUTINE W REFLEX MICROSCOPIC  CULTURE, BLOOD (ROUTINE X 2)  CULTURE, BLOOD (ROUTINE X 2)  URINE CULTURE   Dg Chest 2 View  09/16/2012  *RADIOLOGY REPORT*  Clinical Data: Fever and shortness of breath.  CHEST - 2 VIEW  Comparison: Chest x-ray 08/18/2004.  Findings:  The heart is upper limits of normal and stable.  The mediastinal and hilar contours are unchanged.  Low lung volumes with vascular crowding and bibasilar atelectasis.  No definite infiltrates, effusions or edema.  Stable eventration of the right hemidiaphragm.  The bony thorax is intact.  IMPRESSION: Low lung volumes with vascular crowding and streaky bibasilar atelectasis.   Original Report Authenticated By: P. Loralie Champagne, M.D.    Results for orders placed during the hospital encounter of 09/17/12  CBC WITH DIFFERENTIAL      Component Value Range   WBC 8.9  4.0 - 10.5 K/uL   RBC 4.05 (*) 4.22 - 5.81 MIL/uL   Hemoglobin 11.7 (*) 13.0 - 17.0 g/dL   HCT 16.1 (*) 09.6 - 04.5 %   MCV 83.0  78.0 - 100.0 fL   MCH 28.9  26.0 - 34.0 pg   MCHC 34.8  30.0 - 36.0 g/dL   RDW 40.9  81.1 - 91.4 %   Platelets 233  150 - 400 K/uL   Neutrophils Relative 57  43 - 77 %   Neutro Abs 5.1  1.7 - 7.7 K/uL   Lymphocytes Relative 29  12 - 46 %   Lymphs Abs 2.6  0.7 - 4.0 K/uL   Monocytes Relative 11  3 - 12 %   Monocytes Absolute 1.0  0.1 - 1.0 K/uL   Eosinophils Relative 3  0 - 5 %   Eosinophils Absolute 0.2  0.0 - 0.7 K/uL   Basophils Relative 0  0 - 1 %   Basophils Absolute 0.0  0.0 - 0.1 K/uL  COMPREHENSIVE METABOLIC PANEL      Component Value Range   Sodium 134 (*) 135 - 145 mEq/L   Potassium 3.8  3.5 - 5.1 mEq/L   Chloride 101  96 - 112 mEq/L   CO2 20  19 - 32 mEq/L   Glucose, Bld 97  70 - 99 mg/dL   BUN 29 (*) 6 - 23 mg/dL   Creatinine, Ser 7.82 (*) 0.50 - 1.35 mg/dL   Calcium 9.2  8.4 - 95.6 mg/dL   Total Protein  7.9  6.0 - 8.3 g/dL   Albumin 4.1  3.5 - 5.2 g/dL   AST 32  0 - 37 U/L   ALT 20  0 - 53 U/L   Alkaline Phosphatase 52  39 - 117 U/L   Total Bilirubin 0.4  0.3 - 1.2 mg/dL   GFR calc non Af Amer 27 (*) >90 mL/min   GFR calc Af Amer 32 (*) >90 mL/min  URINALYSIS, ROUTINE W REFLEX MICROSCOPIC      Component Value Range   Color, Urine AMBER (*) YELLOW   APPearance CLEAR  CLEAR   Specific Gravity, Urine 1.025  1.005 - 1.030   pH 5.0  5.0 - 8.0   Glucose, UA NEGATIVE  NEGATIVE mg/dL   Hgb urine dipstick NEGATIVE  NEGATIVE   Bilirubin Urine SMALL (*) NEGATIVE   Ketones, ur 15 (*) NEGATIVE mg/dL   Protein, ur NEGATIVE  NEGATIVE mg/dL   Urobilinogen, UA 0.2  0.0 - 1.0 mg/dL   Nitrite NEGATIVE  NEGATIVE   Leukocytes, UA NEGATIVE  NEGATIVE  LACTIC ACID, PLASMA      Component Value Range   Lactic Acid, Venous 1.4  0.5 - 2.2 mmol/L  TROPONIN I      Component Value Range   Troponin I <0.30  <0.30 ng/mL  OCCULT BLOOD, POC DEVICE      Component  Value Range   Fecal Occult Bld NEGATIVE     Ct Abdomen Pelvis Wo Contrast  09/17/2012  *RADIOLOGY REPORT*  Clinical Data: Body aches and fever.  Decreased appetite.  CT ABDOMEN AND PELVIS WITHOUT CONTRAST  Technique:  Multidetector CT imaging of the abdomen and pelvis was performed following the standard protocol without intravenous contrast.  Comparison: CT abdomen and pelvis 09/06/2004.  Findings: Lung bases are clear.  No pleural or pericardial effusion.  The liver is low attenuating consistent with fatty infiltration. Single punctate calcification near the dome of the liver is identified.  A small stone is identified within the gallbladder and there is some increased attenuation material present compatible with sludge.  No pericholecystic fluid or stranding is identified. The kidneys appear normal bilaterally.  No renal or ureteral stones and no hydronephrosis.  The adrenal glands, spleen and pancreas are unremarkable.  The stomach and small and large  bowel appear normal.  The appendix is not visualized and may have been removed.  There is no lymphadenopathy or fluid.  No focal bony abnormality is identified. The patient has a right total hip replacement.  IMPRESSION:  1.  No acute finding. 2.  Single small gallstone and likely gallbladder sludge.  No CT evidence of cholecystitis. 3.  Status post right hip replacement. 4.  Fatty infiltration of the liver.   Original Report Authenticated By: Bernadene Bell. Maricela Curet, M.D.    Dg Chest 2 View  09/16/2012  *RADIOLOGY REPORT*  Clinical Data: Fever and shortness of breath.  CHEST - 2 VIEW  Comparison: Chest x-ray 08/18/2004.  Findings: The heart is upper limits of normal and stable.  The mediastinal and hilar contours are unchanged.  Low lung volumes with vascular crowding and bibasilar atelectasis.  No definite infiltrates, effusions or edema.  Stable eventration of the right hemidiaphragm.  The bony thorax is intact.  IMPRESSION: Low lung volumes with vascular crowding and streaky bibasilar atelectasis.   Original Report Authenticated By: P. Loralie Champagne, M.D.      1. Fever 2. Dehydration 3. Abdominal pain  MDM  Pt with generalized weakness, subjective fever, chills.  No obvious evidence of infection on exam, specifically no evidence of joint infections on evaluation.  Examination of oral mucosa revealed no obvious signs of deep tissue infection.  Abdomen is tender to suprapubic/RLQ, no guarding no rebound.  Work up initiated.   Pt has a rectal temp of 103.  He has general weakness, unable to ambulate due to weakness.  Tylenol given.  He has a BUN of 29, and Cr 2.33, and evidence of dehydration.   IVF given. CXR unremarkable. Discussed with my attending, who will assess pt.    9:30 PM Pt currently in NAD, drinking contrast.  Will continue care.  Pt has elevated Cr of 2.2, will not have IV contrast.    11:59 PM No evidence of fecal impaction on rectal exam, hemoccult neg.  Since pt's sxs worsen after  I&D of dental abscess, we are concerns of bacteremia.  Blood culture and urine culture sent.  CXR reordered.  Will start on Vanc/Zosyn for broad spectrum coverage.  Will consult for admission for further management.     12:20 AM I have consulted with Dr. Anne Hahn, who agrees to admit pt to [facility], telebed, under his care.  Pt agrees with plan.    BP 154/75  Pulse 96  Temp 100.7 F (38.2 C) (Oral)  Resp 20  SpO2 97%  I have reviewed nursing notes  and vital signs. I personally reviewed the imaging tests through PACS system  I reviewed available ER/hospitalization records thought the EMR     Fayrene Helper, PA-C 09/18/12 0021

## 2012-09-17 NOTE — ED Notes (Signed)
IV team paged for IV start. 

## 2012-09-17 NOTE — ED Notes (Signed)
Pt here for body aches, and fever, onset after having oral surgery sts noticed a pocket of infection and oral surgeon said it was nothjing and then went to dentist and had the area lanced and drained, pt reports decreased appetite, joint pain, and pinpoint head pain worse with palpation pt is on abx regimen.

## 2012-09-17 NOTE — ED Notes (Signed)
Pt to CT

## 2012-09-17 NOTE — ED Notes (Addendum)
Pt finished with contrast. Stating pain is coming back and his belly feels like it is cramping and is more distended than normal. Pt has had only small bowel movement and not really able to pass gas. Will make PA aware. CT called for pt transport.

## 2012-09-18 ENCOUNTER — Encounter (HOSPITAL_COMMUNITY): Payer: Self-pay | Admitting: Internal Medicine

## 2012-09-18 ENCOUNTER — Emergency Department (HOSPITAL_COMMUNITY): Payer: BC Managed Care – PPO

## 2012-09-18 ENCOUNTER — Inpatient Hospital Stay (HOSPITAL_COMMUNITY): Payer: BC Managed Care – PPO

## 2012-09-18 DIAGNOSIS — I10 Essential (primary) hypertension: Secondary | ICD-10-CM | POA: Diagnosis present

## 2012-09-18 DIAGNOSIS — R509 Fever, unspecified: Secondary | ICD-10-CM

## 2012-09-18 DIAGNOSIS — N189 Chronic kidney disease, unspecified: Secondary | ICD-10-CM | POA: Diagnosis present

## 2012-09-18 DIAGNOSIS — N039 Chronic nephritic syndrome with unspecified morphologic changes: Secondary | ICD-10-CM

## 2012-09-18 DIAGNOSIS — E119 Type 2 diabetes mellitus without complications: Secondary | ICD-10-CM | POA: Diagnosis present

## 2012-09-18 DIAGNOSIS — D649 Anemia, unspecified: Secondary | ICD-10-CM | POA: Diagnosis present

## 2012-09-18 DIAGNOSIS — E785 Hyperlipidemia, unspecified: Secondary | ICD-10-CM

## 2012-09-18 LAB — COMPREHENSIVE METABOLIC PANEL
ALT: 20 U/L (ref 0–53)
AST: 37 U/L (ref 0–37)
Alkaline Phosphatase: 45 U/L (ref 39–117)
CO2: 20 mEq/L (ref 19–32)
Chloride: 103 mEq/L (ref 96–112)
GFR calc non Af Amer: 35 mL/min — ABNORMAL LOW (ref >90)
Potassium: 3.7 mEq/L (ref 3.5–5.1)
Sodium: 134 mEq/L — ABNORMAL LOW (ref 135–145)
Total Bilirubin: 0.4 mg/dL (ref 0.3–1.2)

## 2012-09-18 LAB — INFLUENZA PANEL BY PCR (TYPE A & B): Influenza B By PCR: NEGATIVE

## 2012-09-18 LAB — CBC WITH DIFFERENTIAL/PLATELET
Basophils Absolute: 0 10*3/uL (ref 0.0–0.1)
Basophils Relative: 0 % (ref 0–1)
Eosinophils Absolute: 0.3 10*3/uL (ref 0.0–0.7)
MCH: 28.7 pg (ref 26.0–34.0)
MCHC: 34.3 g/dL (ref 30.0–36.0)
Monocytes Absolute: 1.1 10*3/uL — ABNORMAL HIGH (ref 0.1–1.0)
Neutro Abs: 4.2 10*3/uL (ref 1.7–7.7)
Neutrophils Relative %: 45 % (ref 43–77)
RDW: 12.8 % (ref 11.5–15.5)

## 2012-09-18 LAB — GLUCOSE, CAPILLARY
Glucose-Capillary: 74 mg/dL (ref 70–99)
Glucose-Capillary: 101 mg/dL — ABNORMAL HIGH (ref 70–99)
Glucose-Capillary: 84 mg/dL (ref 70–99)

## 2012-09-18 LAB — MRSA PCR SCREENING: MRSA by PCR: NEGATIVE

## 2012-09-18 LAB — CK: Total CK: 310 U/L — ABNORMAL HIGH (ref 7–232)

## 2012-09-18 LAB — URINE CULTURE: Colony Count: NO GROWTH

## 2012-09-18 MED ORDER — INSULIN ASPART 100 UNIT/ML ~~LOC~~ SOLN
0.0000 [IU] | Freq: Three times a day (TID) | SUBCUTANEOUS | Status: DC
  Administered 2012-09-22: 1 [IU] via SUBCUTANEOUS

## 2012-09-18 MED ORDER — FENOFIBRATE 160 MG PO TABS
160.0000 mg | ORAL_TABLET | Freq: Every day | ORAL | Status: DC
  Administered 2012-09-18 – 2012-09-22 (×5): 160 mg via ORAL
  Filled 2012-09-18 (×5): qty 1

## 2012-09-18 MED ORDER — VANCOMYCIN HCL IN DEXTROSE 1-5 GM/200ML-% IV SOLN
1000.0000 mg | Freq: Once | INTRAVENOUS | Status: AC
  Administered 2012-09-18: 1000 mg via INTRAVENOUS
  Filled 2012-09-18: qty 200

## 2012-09-18 MED ORDER — INFLUENZA VIRUS VACC SPLIT PF IM SUSP
0.5000 mL | INTRAMUSCULAR | Status: AC
  Administered 2012-09-19: 0.5 mL via INTRAMUSCULAR
  Filled 2012-09-18: qty 0.5

## 2012-09-18 MED ORDER — ENOXAPARIN SODIUM 30 MG/0.3ML ~~LOC~~ SOLN
30.0000 mg | SUBCUTANEOUS | Status: DC
  Filled 2012-09-18 (×2): qty 0.3

## 2012-09-18 MED ORDER — GLUCOSE 40 % PO GEL
ORAL | Status: AC
  Filled 2012-09-18: qty 1

## 2012-09-18 MED ORDER — EZETIMIBE-SIMVASTATIN 10-20 MG PO TABS
1.0000 | ORAL_TABLET | Freq: Every day | ORAL | Status: DC
  Administered 2012-09-18 – 2012-09-21 (×4): 1 via ORAL
  Filled 2012-09-18 (×5): qty 1

## 2012-09-18 MED ORDER — INSULIN GLARGINE 100 UNIT/ML ~~LOC~~ SOLN
34.0000 [IU] | Freq: Every day | SUBCUTANEOUS | Status: DC
  Administered 2012-09-18 – 2012-09-21 (×4): 34 [IU] via SUBCUTANEOUS

## 2012-09-18 MED ORDER — ZOLPIDEM TARTRATE 5 MG PO TABS
5.0000 mg | ORAL_TABLET | Freq: Every evening | ORAL | Status: DC | PRN
  Administered 2012-09-18 – 2012-09-22 (×4): 5 mg via ORAL
  Filled 2012-09-18 (×4): qty 1

## 2012-09-18 MED ORDER — ONDANSETRON HCL 4 MG PO TABS: 4.0000 mg | ORAL_TABLET | Freq: Four times a day (QID) | ORAL | Status: DC | PRN

## 2012-09-18 MED ORDER — COLCHICINE 0.6 MG PO TABS
0.6000 mg | ORAL_TABLET | Freq: Every day | ORAL | Status: DC
  Administered 2012-09-18 – 2012-09-22 (×5): 0.6 mg via ORAL
  Filled 2012-09-18 (×5): qty 1

## 2012-09-18 MED ORDER — EZETIMIBE-SIMVASTATIN 10-40 MG PO TABS
1.0000 | ORAL_TABLET | Freq: Every day | ORAL | Status: DC
Start: 2012-09-18 — End: 2012-09-18

## 2012-09-18 MED ORDER — SODIUM CHLORIDE 0.9 % IV SOLN: INTRAVENOUS | Status: DC

## 2012-09-18 MED ORDER — ACETAMINOPHEN 650 MG RE SUPP: 650.0000 mg | Freq: Four times a day (QID) | RECTAL | Status: DC | PRN

## 2012-09-18 MED ORDER — GLIPIZIDE ER 10 MG PO TB24
10.0000 mg | ORAL_TABLET | Freq: Every day | ORAL | Status: DC
  Administered 2012-09-19 – 2012-09-21 (×3): 10 mg via ORAL
  Filled 2012-09-18 (×6): qty 1

## 2012-09-18 MED ORDER — ZOLPIDEM TARTRATE 5 MG PO TABS
5.0000 mg | ORAL_TABLET | Freq: Once | ORAL | Status: AC
  Administered 2012-09-18: 5 mg via ORAL
  Filled 2012-09-18: qty 1

## 2012-09-18 MED ORDER — DEXTROSE 5 % IV SOLN
1.0000 g | Freq: Once | INTRAVENOUS | Status: AC
  Administered 2012-09-18: 1 g via INTRAVENOUS
  Filled 2012-09-18: qty 1

## 2012-09-18 MED ORDER — VANCOMYCIN HCL IN DEXTROSE 1-5 GM/200ML-% IV SOLN
1000.0000 mg | INTRAVENOUS | Status: DC
  Filled 2012-09-18: qty 200

## 2012-09-18 MED ORDER — ACETAMINOPHEN 325 MG PO TABS
650.0000 mg | ORAL_TABLET | Freq: Four times a day (QID) | ORAL | Status: DC | PRN
  Administered 2012-09-18 – 2012-09-19 (×5): 650 mg via ORAL
  Filled 2012-09-18 (×5): qty 2

## 2012-09-18 MED ORDER — VANCOMYCIN HCL 1000 MG IV SOLR
1500.0000 mg | INTRAVENOUS | Status: DC
  Administered 2012-09-18 – 2012-09-19 (×2): 1500 mg via INTRAVENOUS
  Filled 2012-09-18 (×3): qty 1500

## 2012-09-18 MED ORDER — ONDANSETRON HCL 4 MG/2ML IJ SOLN: 4.0000 mg | Freq: Four times a day (QID) | INTRAMUSCULAR | Status: DC | PRN

## 2012-09-18 MED ORDER — SODIUM CHLORIDE 0.9 % IV SOLN
INTRAVENOUS | Status: AC
  Administered 2012-09-18: 02:00:00 via INTRAVENOUS

## 2012-09-18 MED ORDER — FEBUXOSTAT 40 MG PO TABS
40.0000 mg | ORAL_TABLET | Freq: Every day | ORAL | Status: DC
  Administered 2012-09-18 – 2012-09-22 (×5): 40 mg via ORAL
  Filled 2012-09-18 (×5): qty 1

## 2012-09-18 MED ORDER — ENOXAPARIN SODIUM 60 MG/0.6ML ~~LOC~~ SOLN
50.0000 mg | SUBCUTANEOUS | Status: DC
  Administered 2012-09-18 – 2012-09-22 (×5): 50 mg via SUBCUTANEOUS
  Filled 2012-09-18 (×6): qty 0.6

## 2012-09-18 MED ORDER — HYDROMORPHONE HCL PF 1 MG/ML IJ SOLN: 1.0000 mg | INTRAMUSCULAR | Status: DC | PRN

## 2012-09-18 MED ORDER — DEXTROSE 5 % IV SOLN
1.0000 g | Freq: Every day | INTRAVENOUS | Status: DC
  Administered 2012-09-18 – 2012-09-20 (×2): 1 g via INTRAVENOUS
  Filled 2012-09-18 (×3): qty 1

## 2012-09-18 MED ORDER — AMITRIPTYLINE HCL 50 MG PO TABS
50.0000 mg | ORAL_TABLET | Freq: Every day | ORAL | Status: DC
  Administered 2012-09-18 – 2012-09-21 (×5): 50 mg via ORAL
  Filled 2012-09-18 (×6): qty 1

## 2012-09-18 MED ORDER — SODIUM CHLORIDE 0.9 % IJ SOLN
3.0000 mL | Freq: Two times a day (BID) | INTRAMUSCULAR | Status: DC
  Administered 2012-09-18 – 2012-09-21 (×3): 3 mL via INTRAVENOUS

## 2012-09-18 MED ORDER — ONDANSETRON HCL 4 MG/2ML IJ SOLN: 4.0000 mg | Freq: Three times a day (TID) | INTRAMUSCULAR | Status: DC | PRN

## 2012-09-18 MED ORDER — GLUCOSE 40 % PO GEL
1.0000 | ORAL | Status: DC | PRN
  Administered 2012-09-18 – 2012-09-21 (×2): 37.5 g via ORAL

## 2012-09-18 MED ORDER — PIPERACILLIN-TAZOBACTAM 3.375 G IVPB: 3.3750 g | Freq: Once | INTRAVENOUS | Status: DC

## 2012-09-18 NOTE — H&P (Addendum)
[name] is an 66 y.o. male.   Patient was seen and examined on September 18, 2012. PCP - Dr. Cam Hai. Chief Complaint: Fever and weakness. HPI: 66 year old male history of diabetes mellitus2, hypertension, chronic kidney disease had a root canal treatment 3 weeks ago first left upper tooth. Few days after which patient developed a small abscess around and his dental surgeon had lanced it and have placed him on antibiotics for 5 days. Since last Friday that is 6 days ago patient started developing fever and chills. Prior to this patient was feeling weak for a few days. He got his PCPs office day before yesterday and then divided was changed to Augmentin. He had taken 2 doses of which yesterday but since he was getting progressively weak fatigued and patient is having persistent fever he came to the ER. In the ER patient was found to be febrile with mild leukocytosis. Patient initially was found to be mildly hypotensive but improved with fluids. Lactic acid was normal. Blood cultures and urine cultures has been obtained and empiric antibiotics started. Patient states 3 [place] 2 [place] with his wife. Denies noticing any insect bites. Patient has been having increasing joint pains but again the right shoulder and knee. Also has been having myalgia and also periumbilical pain. CT abdomen pelvis done in the ER was unremarkable.  Past Medical History  Diagnosis Date  . Anemia   . Renal insufficiency   . Hypertension   . Gout   . Diabetes mellitus   . Hypercholesterolemia   . Diverticulosis   . Sleep apnea   . History of nonmelanoma skin cancer     Past Surgical History  Procedure Date  . Total hip arthroplasty   . Mohs surgery     History reviewed. No pertinent family history. Social History:  reports that he has never smoked. He does not have any smokeless tobacco history on file. He reports that he does not drink alcohol or use  illicit drugs.  Allergies: No Known Allergies   (Not in a hospital admission)  Results for orders placed during the hospital encounter of 09/17/12 (from the past 48 hour(s))  CBC WITH DIFFERENTIAL     Status: Abnormal   Collection Time   09/17/12  3:10 PM      Component Value Range Comment   WBC 8.9  4.0 - 10.5 K/uL    RBC 4.05 (*) 4.22 - 5.81 MIL/uL    Hemoglobin 11.7 (*) 13.0 - 17.0 g/dL    HCT 16.1 (*) 09.6 - 52.0 %    MCV 83.0  78.0 - 100.0 fL    MCH 28.9  26.0 - 34.0 pg    MCHC 34.8  30.0 - 36.0 g/dL    RDW 04.5  40.9 - 81.1 %    Platelets 233  150 - 400 K/uL    Neutrophils Relative 57  43 - 77 %    Neutro Abs 5.1  1.7 - 7.7 K/uL    Lymphocytes Relative 29  12 - 46 %    Lymphs Abs 2.6  0.7 - 4.0 K/uL    Monocytes Relative 11  3 - 12 %    Monocytes Absolute 1.0  0.1 - 1.0 K/uL    Eosinophils Relative 3  0 - 5 %    Eosinophils Absolute 0.2  0.0 - 0.7 K/uL    Basophils Relative 0  0 - 1 %  Basophils Absolute 0.0  0.0 - 0.1 K/uL   COMPREHENSIVE METABOLIC PANEL     Status: Abnormal   Collection Time   09/17/12  3:10 PM      Component Value Range Comment   Sodium 134 (*) 135 - 145 mEq/L    Potassium 3.8  3.5 - 5.1 mEq/L    Chloride 101  96 - 112 mEq/L    CO2 20  19 - 32 mEq/L    Glucose, Bld 97  70 - 99 mg/dL    BUN 29 (*) 6 - 23 mg/dL    Creatinine, Ser 4.54 (*) 0.50 - 1.35 mg/dL    Calcium 9.2  8.4 - 09.8 mg/dL    Total Protein 7.9  6.0 - 8.3 g/dL    Albumin 4.1  3.5 - 5.2 g/dL    AST 32  0 - 37 U/L    ALT 20  0 - 53 U/L    Alkaline Phosphatase 52  39 - 117 U/L    Total Bilirubin 0.4  0.3 - 1.2 mg/dL    GFR calc non Af Amer 27 (*) >90 mL/min    GFR calc Af Amer 32 (*) >90 mL/min   LACTIC ACID, PLASMA     Status: Normal   Collection Time   09/17/12  4:07 PM      Component Value Range Comment   Lactic Acid, Venous 1.4  0.5 - 2.2 mmol/L   TROPONIN I     Status: Normal   Collection Time   09/17/12  4:08 PM      Component Value Range Comment   Troponin I  <0.30  <0.30 ng/mL   URINALYSIS, ROUTINE W REFLEX MICROSCOPIC     Status: Abnormal   Collection Time   09/17/12  6:37 PM      Component Value Range Comment   Color, Urine AMBER (*) YELLOW BIOCHEMICALS MAY BE AFFECTED BY COLOR   APPearance CLEAR  CLEAR    Specific Gravity, Urine 1.025  1.005 - 1.030    pH 5.0  5.0 - 8.0    Glucose, UA NEGATIVE  NEGATIVE mg/dL    Hgb urine dipstick NEGATIVE  NEGATIVE    Bilirubin Urine SMALL (*) NEGATIVE    Ketones, ur 15 (*) NEGATIVE mg/dL    Protein, ur NEGATIVE  NEGATIVE mg/dL    Urobilinogen, UA 0.2  0.0 - 1.0 mg/dL    Nitrite NEGATIVE  NEGATIVE    Leukocytes, UA NEGATIVE  NEGATIVE MICROSCOPIC NOT DONE ON URINES WITH NEGATIVE PROTEIN, BLOOD, LEUKOCYTES, NITRITE, OR GLUCOSE <1000 mg/dL.  OCCULT BLOOD, POC DEVICE     Status: Normal   Collection Time   09/17/12 11:22 PM      Component Value Range Comment   Fecal Occult Bld NEGATIVE      Ct Abdomen Pelvis Wo Contrast  09/17/2012  *RADIOLOGY REPORT*  Clinical Data: Body aches and fever.  Decreased appetite.  CT ABDOMEN AND PELVIS WITHOUT CONTRAST  Technique:  Multidetector CT imaging of the abdomen and pelvis was performed following the standard protocol without intravenous contrast.  Comparison: CT abdomen and pelvis 09/06/2004.  Findings: Lung bases are clear.  No pleural or pericardial effusion.  The liver is low attenuating consistent with fatty infiltration. Single punctate calcification near the dome of the liver is identified.  A small stone is identified within the gallbladder and there is some increased attenuation material present compatible with sludge.  No pericholecystic fluid or stranding is identified. The kidneys appear normal bilaterally.  No renal or ureteral stones and no hydronephrosis.  The adrenal glands, spleen and pancreas are unremarkable.  The stomach and small and large bowel appear normal.  The appendix is not visualized and may have been removed.  There is no lymphadenopathy or  fluid.  No focal bony abnormality is identified. The patient has a right total hip replacement.  IMPRESSION:  1.  No acute finding. 2.  Single small gallstone and likely gallbladder sludge.  No CT evidence of cholecystitis. 3.  Status post right hip replacement. 4.  Fatty infiltration of the liver.   Original Report Authenticated By: Bernadene Bell. Maricela Curet, M.D.    Dg Chest 2 View  09/18/2012  *RADIOLOGY REPORT*  Clinical Data: Fever, chills and weakness.  CHEST - 2 VIEW  Comparison: PA and lateral chest 09/16/2012 and 08/18/2004.  Findings: Lungs are clear.  Heart size is normal.  No pneumothorax or pleural effusion.  IMPRESSION: No acute disease.   Original Report Authenticated By: Bernadene Bell. Maricela Curet, M.D.    Dg Chest 2 View  09/16/2012  *RADIOLOGY REPORT*  Clinical Data: Fever and shortness of breath.  CHEST - 2 VIEW  Comparison: Chest x-ray 08/18/2004.  Findings: The heart is upper limits of normal and stable.  The mediastinal and hilar contours are unchanged.  Low lung volumes with vascular crowding and bibasilar atelectasis.  No definite infiltrates, effusions or edema.  Stable eventration of the right hemidiaphragm.  The bony thorax is intact.  IMPRESSION: Low lung volumes with vascular crowding and streaky bibasilar atelectasis.   Original Report Authenticated By: P. Loralie Champagne, M.D.     Review of Systems  Constitutional: Positive for fever.  HENT: Negative.   Eyes: Negative.   Respiratory: Negative.   Cardiovascular: Negative.   Gastrointestinal: Positive for abdominal pain.  Genitourinary: Negative.   Musculoskeletal: Positive for myalgias.  Skin: Negative.   Neurological: Positive for weakness.  Endo/Heme/Allergies: Negative.   Psychiatric/Behavioral: Negative.     Blood pressure 154/75, pulse 96, temperature 100.7 F (38.2 C), temperature source Oral, resp. rate 20, SpO2 97.00%. Physical Exam  Constitutional: He is oriented to person, place, and time. He appears  well-developed and well-nourished. No distress.  HENT:  Head: Normocephalic and atraumatic.  Right Ear: External ear normal.  Left Ear: External ear normal.  Nose: Nose normal.  Mouth/Throat: Oropharynx is clear and moist. No oropharyngeal exudate.  Eyes: Conjunctivae normal are normal. Pupils are equal, round, and reactive to light. Right eye exhibits no discharge. Left eye exhibits no discharge. No scleral icterus.  Neck: Normal range of motion. Neck supple.  Cardiovascular: Normal rate and regular rhythm.   Respiratory: Effort normal and breath sounds normal. No respiratory distress. He has no wheezes. He has no rales.  GI: Soft. Bowel sounds are normal. He exhibits no distension. There is no tenderness. There is no rebound.  Musculoskeletal: He exhibits no edema and no tenderness.  Neurological: He is alert and oriented to person, place, and time.       Moves all extremities.  Skin: He is not diaphoretic.     Assessment/Plan #1. Fever of unknown origin - blood cultures and urine cultures have been obtained. At this time UA and chest x-ray unremarkable. An orthopantomogram has been ordered to check for any tooth abscess. Patient denies any pain in any of his teeth. Patient has been empirically started on vancomycin and cefepime for now. Closely follow cultures. Sedimentation rate has been ordered. I don't appreciate any cardiac murmurs at this time. But  if blood cultures are positive will need 2-D echo. Check Flu panel. #2. History of hypertension - since patient was mildly hypotensive initially I have held patient's antihypertensives. Closely observe blood pressure trends. #3. ARF on CKD - probably secondary to poor oral intake and dehydration and lower pressure. Gently hydrate and recheck metabolic panel. UA is unremarkable. #4. Diabetes mellitus type 2 - hold metformin. Continue other medications with close followup of CBG. #5. Anemia secondary to chronic kidney disease - follow  CBC. #6. OSA noncompliant with CPAP.  CODE STATUS - full code.  KAKRAKANDY,ARSHAD N. 09/18/2012, 1:14 AM

## 2012-09-18 NOTE — Progress Notes (Signed)
ANTIBIOTIC CONSULT NOTE - INITIAL  Pharmacy Consult for vancomycin, cefepime Indication: Empiric  No Known Allergies  Patient Measurements: Height: 5' 10.08" (178 cm) Weight: 230 lb 6.1 oz (104.5 kg) (Per 08/21/12 documentation) IBW/kg (Calculated) : 73.18   Vital Signs: Temp: 100.7 F (38.2 C) (10/31 0005) Temp src: Oral (10/31 0005) BP: 154/75 mmHg (10/31 0005) Pulse Rate: 96  (10/31 0005) Intake/Output from previous day:   Intake/Output from this shift:    Labs:  Basename 09/17/12 1510  WBC 8.9  HGB 11.7*  PLT 233  LABCREA --  CREATININE 2.33*   Estimated Creatinine Clearance: 37.3 ml/min (by C-G formula based on Cr of 2.33). No results found for this basename: VANCOTROUGH:2,VANCOPEAK:2,VANCORANDOM:2,GENTTROUGH:2,GENTPEAK:2,GENTRANDOM:2,TOBRATROUGH:2,TOBRAPEAK:2,TOBRARND:2,AMIKACINPEAK:2,AMIKACINTROU:2,AMIKACIN:2, in the last 72 hours   Microbiology: No results found for this or any previous visit (from the past 720 hour(s)).  Medical History: Past Medical History  Diagnosis Date  . Anemia   . Renal insufficiency   . Hypertension   . Gout   . Diabetes mellitus   . Hypercholesterolemia   . Diverticulosis   . Sleep apnea   . History of nonmelanoma skin cancer     Medications:  Scheduled:    . sodium chloride  1,000 mL Intravenous Once   Followed by  . sodium chloride  1,000 mL Intravenous Once  . acetaminophen  650 mg Oral Once  . amitriptyline  50 mg Oral QHS  . colchicine  0.6 mg Oral Daily  . enoxaparin (LOVENOX) injection  30 mg Subcutaneous Q24H  . ezetimibe-simvastatin  1 tablet Oral QHS  . febuxostat  40 mg Oral Daily  . fenofibrate  160 mg Oral Daily  . glipiZIDE  10 mg Oral Daily  . insulin aspart  0-9 Units Subcutaneous TID WC  . insulin glargine  34 Units Subcutaneous BH-q7a  . iohexol  20 mL Oral Q1 Hr x 2  .  morphine injection  4 mg Intravenous Once  .  morphine injection  4 mg Intravenous Once  . piperacillin-tazobactam (ZOSYN)  IV   3.375 g Intravenous Once  . sodium chloride  3 mL Intravenous Q12H  . vancomycin  1,000 mg Intravenous Once  . DISCONTD: sodium chloride   Intravenous STAT   Assessment: 66 yo male presented with fever. Pharmacy consulted to manage vancomycin and cefepime for empiric coverage. Patient received vancomycin 1gm IV x 1.   Goal of Therapy:  Vancomycin trough 10 - 20 mcg/mL  Plan:  1. Cefepime 1gm IV Q24H.  2. Vancomycin 1gm IV Q24H.   Emeline Gins 09/18/2012,1:54 AM

## 2012-09-18 NOTE — Progress Notes (Signed)
Hypoglycemic Event  CBG: 64  Treatment: 15 gram glucogel  Symptoms: none   Follow-up CBG: [id number] CBG Result:74  Possible Reasons for Event: unknown Comments/MD notified: yes    [name], [name]  Remember to initiate Hypoglycemia Order Set & complete

## 2012-09-18 NOTE — Progress Notes (Addendum)
Inpatient Diabetes Program Recommendations  AACE/ADA: New Consensus Statement on Inpatient Glycemic Control (2013)  Target Ranges:  Prepandial:   less than 140 mg/dL      Peak postprandial:   less than 180 mg/dL (1-2 hours)      Critically ill patients:  140 - 180 mg/dL   Reason for Visit: Hypoglycemia  Inpatient Diabetes Program Recommendations Insulin - Basal: xxx May want to use half of home Lantus dose to start  Oral Agents: Pt's po initake is variable.  Please do not use Glucotrol while here at this time. Start with 15 units Lantus at this point. Note: Thank you, Lenor Coffin, RN, CNS, Diabetes Coordinator [phone])

## 2012-09-18 NOTE — Progress Notes (Signed)
ANTIBIOTIC CONSULT NOTE - FOLLOW UP  Pharmacy Consult for Vancomycin + Cefepime Indication: Empiric coverage in setting of fevers  No Known Allergies  Patient Measurements: Height: 5' 10.08" (178 cm) Weight: 230 lb 6.1 oz (104.5 kg) (Per 08/21/12 documentation) IBW/kg (Calculated) : 73.18    Vital Signs: Temp: 102.2 F (39 C) (10/31 0800) Temp src: Oral (10/31 0800) BP: 142/52 mmHg (10/31 0800) Pulse Rate: 100  (10/31 0800) Intake/Output from previous day: 10/30 0701 - 10/31 0700 In: 452.1 [I.V.:452.1] Out: -  Intake/Output from this shift:    Labs:  Basename 09/18/12 0500 09/17/12 1510  WBC 9.3 8.9  HGB 10.2* 11.7*  PLT 210 233  LABCREA -- --  CREATININE 1.89* 2.33*   Estimated Creatinine Clearance: 46 ml/min (by C-G formula based on Cr of 1.89). No results found for this basename: VANCOTROUGH:2,VANCOPEAK:2,VANCORANDOM:2,GENTTROUGH:2,GENTPEAK:2,GENTRANDOM:2,TOBRATROUGH:2,TOBRAPEAK:2,TOBRARND:2,AMIKACINPEAK:2,AMIKACINTROU:2,AMIKACIN:2, in the last 72 hours   Microbiology: Recent Results (from the past 720 hour(s))  MRSA PCR SCREENING     Status: Normal   Collection Time   09/18/12  2:17 AM      Component Value Range Status Comment   MRSA by PCR NEGATIVE  NEGATIVE Final     Anti-infectives     Start     Dose/Rate Route Frequency Ordered Stop   09/18/12 2200   ceFEPIme (MAXIPIME) 1 g in dextrose 5 % 50 mL IVPB        1 g 100 mL/hr over 30 Minutes Intravenous Daily at bedtime 09/18/12 0200     09/18/12 1200   vancomycin (VANCOCIN) IVPB 1000 mg/200 mL premix  Status:  Discontinued        1,000 mg 200 mL/hr over 60 Minutes Intravenous Every 24 hours 09/18/12 0200 09/18/12 1009   09/18/12 1200   vancomycin (VANCOCIN) 1,500 mg in sodium chloride 0.9 % 500 mL IVPB        1,500 mg 250 mL/hr over 120 Minutes Intravenous Every 24 hours 09/18/12 1009     09/18/12 0215   ceFEPIme (MAXIPIME) 1 g in dextrose 5 % 50 mL IVPB        1 g 100 mL/hr over 30 Minutes  Intravenous  Once 09/18/12 0159 09/18/12 0314   09/18/12 0015   vancomycin (VANCOCIN) IVPB 1000 mg/200 mL premix        1,000 mg 200 mL/hr over 60 Minutes Intravenous  Once 09/18/12 0013 09/18/12 0130   09/18/12 0015   piperacillin-tazobactam (ZOSYN) IVPB 3.375 g  Status:  Discontinued        3.375 g 12.5 mL/hr over 240 Minutes Intravenous  Once 09/18/12 0013 09/18/12 0159          Assessment: 66 y.o. M on Vancomycin + Cefepime for empiric coverage in setting of fevers. Pt noted to have recent treatment of tooth abscess -- blood and urine cultures are pending. The patient's renal function has improved this morning requiring a dose adjustment of Vancomycin. SCr 1.89, CrCl~35-40 ml/min (normalized). Cefepime dose remain appropriate.  The patient is also noted to require a dose adjustment of lovenox for VTE prophylaxis in the setting of CrCl>30 ml/min and BMI >30 (calculated BMI~32.9). Will adjust this to 0.5 mg/kg dosing.  Goal of Therapy:  Vancomycin trough level 15-20 mcg/ml  Plan:  1. Increase Vancomycin to 1500 mg IV every 24 hours 2. Continue Cefepime 1g IV every 24 hours 3. Adjust Lovenox to 50 mg SQ every 24 hours for VTE px 3. Will continue to follow renal function, culture results, LOT, and antibiotic de-escalation plans  Georgina Pillion, PharmD, BCPS Clinical Pharmacist Pager: [phone] 09/18/2012 10:21 AM

## 2012-09-18 NOTE — Progress Notes (Signed)
[facility]                                                                     Progress note The patient is a 66 year old male with history of diabetes mellitus2, hypertension, chronic kidney disease had a root canal treatment 3 weeks ago first left upper tooth. Few days after which patient developed a small abscess around and his dental surgeon had lanced it and have placed him on antibiotics for 5 days. Since last Friday that is 6 days ago patient started developing fever and chills.Patient also states 3 [place] 2 [place] with his wife. Denies noticing any insect bites. Patient has been having increasing joint pains in his large joints bilaterally but worse in his right shoulder and elbow. At the time of my visit he states he is beginning to feel better, he denies cough, diarrhea, no dysuria. He continues to be febrile but he is hemodynamically stable with no leukocytosis. We'll continue current management plan as per Dr.Kakrakandy- and follow up on cultures. The orthopantogam today done shows no evidence of abscess. Will obtain a 2-D echocardiogram also sedimentation rate and CRP follow and consult ID pending results.  Donnalee Curry [facility] [phone]

## 2012-09-18 NOTE — Progress Notes (Signed)
INITIAL ADULT NUTRITION ASSESSMENT Date: 09/18/2012   Time: 11:08 AM  Reason for Assessment: Malnutrition Screening  INTERVENTION: 1. Pt declined all nutrition supplements and snacks at this time. 2. RD to continue to follow nutrition care plan and assess PO adequacy  DOCUMENTATION CODES Per approved criteria  -Obesity Unspecified   ASSESSMENT: Male 66 y.o.  Dx: Fever  Hx:  Past Medical History  Diagnosis Date  . Anemia   . Renal insufficiency   . Hypertension   . Gout   . Diabetes mellitus   . Hypercholesterolemia   . Diverticulosis   . Sleep apnea   . History of nonmelanoma skin cancer    Past Surgical History  Procedure Date  . Total hip arthroplasty   . Mohs surgery    Related Meds:     . sodium chloride  1,000 mL Intravenous Once   Followed by  . sodium chloride  1,000 mL Intravenous Once  . acetaminophen  650 mg Oral Once  . amitriptyline  50 mg Oral QHS  . ceFEPime (MAXIPIME) IV  1 g Intravenous Once  . ceFEPime (MAXIPIME) IV  1 g Intravenous QHS  . colchicine  0.6 mg Oral Daily  . enoxaparin (LOVENOX) injection  50 mg Subcutaneous Q24H  . ezetimibe-simvastatin  1 tablet Oral QHS  . febuxostat  40 mg Oral Daily  . fenofibrate  160 mg Oral Daily  . glipiZIDE  10 mg Oral QAC breakfast  . influenza  inactive virus vaccine  0.5 mL Intramuscular Tomorrow-1000  . insulin aspart  0-9 Units Subcutaneous TID WC  . insulin glargine  34 Units Subcutaneous Daily  . iohexol  20 mL Oral Q1 Hr x 2  .  morphine injection  4 mg Intravenous Once  .  morphine injection  4 mg Intravenous Once  . sodium chloride  3 mL Intravenous Q12H  . vancomycin  1,500 mg Intravenous Q24H  . vancomycin  1,000 mg Intravenous Once  . zolpidem  5 mg Oral Once  . DISCONTD: sodium chloride   Intravenous STAT  . DISCONTD: dextrose      . DISCONTD: enoxaparin (LOVENOX) injection  30 mg Subcutaneous Q24H  . DISCONTD: ezetimibe-simvastatin  1 tablet Oral QHS  . DISCONTD:  piperacillin-tazobactam (ZOSYN)  IV  3.375 g Intravenous Once  . DISCONTD: vancomycin  1,000 mg Intravenous Q24H   Ht: 5' 10.08" (178 cm)  Wt: 230 lb 6.1 oz (104.5 kg) (Per 08/21/12 documentation)  Ideal Wt: 166 lb/75.5 kg % Ideal Wt: 230%  Wt Readings from Last 15 Encounters:  09/18/12 230 lb 6.1 oz (104.5 kg)  08/21/12 230 lb 6.4 oz (104.509 kg)  02/21/12 234 lb 11.2 oz (106.459 kg)  01/03/12 235 lb 9.6 oz (106.867 kg)  Usual Wt: 235 lb % Usual Wt: 98%  Body mass index is 32.98 kg/(m^2). Obese Class I  Food/Nutrition Related Hx: pt with poor appetite x 5 days  Labs:  CMP     Component Value Date/Time   NA 134* 09/18/2012 0500   NA 142 08/21/2012 1132   K 3.7 09/18/2012 0500   K 3.9 08/21/2012 1132   CL 103 09/18/2012 0500   CL 112* 08/21/2012 1132   CO2 20 09/18/2012 0500   CO2 20* 08/21/2012 1132   GLUCOSE 64* 09/18/2012 0500   GLUCOSE 107* 08/21/2012 1132   BUN 26* 09/18/2012 0500   BUN 28.0* 08/21/2012 1132   CREATININE 1.89* 09/18/2012 0500   CREATININE 1.7* 08/21/2012 1132   CALCIUM 8.6 09/18/2012 0500  CALCIUM 9.4 08/21/2012 1132   PROT 6.9 09/18/2012 0500   PROT 6.8 08/21/2012 1132   ALBUMIN 3.6 09/18/2012 0500   ALBUMIN 3.8 08/21/2012 1132   AST 37 09/18/2012 0500   AST 44* 08/21/2012 1132   ALT 20 09/18/2012 0500   ALT 36 08/21/2012 1132   ALKPHOS 45 09/18/2012 0500   ALKPHOS 58 08/21/2012 1132   BILITOT 0.4 09/18/2012 0500   BILITOT 0.30 08/21/2012 1132   GFRNONAA 35* 09/18/2012 0500   GFRAA 41* 09/18/2012 0500    Intake/Output Summary (Last 24 hours) at 09/18/12 1122 Last data filed at 09/18/12 0602  Gross per 24 hour  Intake 452.08 ml  Output      0 ml  Net 452.08 ml   Diet Order: Carb Control Medium 1600  - 2000  Supplements/Tube Feeding: none  IVF:    sodium chloride   sodium chloride Last Rate: 125 mL/hr at 09/18/12 0225   Estimated Nutritional Needs:   Kcal: 1800 - 1900 kcal Protein: 75 - 85 grams Fluid: 1.8 - 2 liters daily  Admitted  with fever and weakness. States that he has had about a 5 lb weight loss in 5 weeks 2/2 taking a new diabetic medication. Noted that for the past 5 days however, pt unable to eat well 2/2 acute medical issues (fever/chills.) Was still able to eat bites of foods and consume soup during his illness. Pt declined snacks and supplements at this time. States that he suspects his intake will improve.  Pt is at nutrition risk given poor intake x 5 days. Wt loss of 2% is not significant at this time.  Follows a low sodium and low "sugar" diet PTA.  NUTRITION DIAGNOSIS: Inadequate oral intake r/t poor appetite AEB pt report.  MONITORING/EVALUATION(Goals): Goal: Pt to meet >/= 90% of their estimated nutrition needs Monitor: weight trends, lab trends, I/O's, PO intake  EDUCATION NEEDS: -No education needs identified at this time    Jarold Motto MS, RD, LDN Pager: [phone] After-hours pager: [phone]

## 2012-09-19 ENCOUNTER — Inpatient Hospital Stay (HOSPITAL_COMMUNITY): Payer: BC Managed Care – PPO

## 2012-09-19 DIAGNOSIS — I517 Cardiomegaly: Secondary | ICD-10-CM

## 2012-09-19 LAB — GLUCOSE, CAPILLARY
Glucose-Capillary: 83 mg/dL (ref 70–99)
Glucose-Capillary: 103 mg/dL — ABNORMAL HIGH (ref 70–99)

## 2012-09-19 MED ORDER — AMLODIPINE BESYLATE 10 MG PO TABS
10.0000 mg | ORAL_TABLET | Freq: Every day | ORAL | Status: DC
  Administered 2012-09-20 – 2012-09-22 (×3): 10 mg via ORAL
  Filled 2012-09-19 (×3): qty 1

## 2012-09-19 MED ORDER — IOHEXOL 300 MG/ML  SOLN
65.0000 mL | Freq: Once | INTRAMUSCULAR | Status: AC | PRN
  Administered 2012-09-19: 65 mL via INTRAVENOUS

## 2012-09-19 MED ORDER — LABETALOL HCL 300 MG PO TABS
300.0000 mg | ORAL_TABLET | Freq: Two times a day (BID) | ORAL | Status: DC
  Administered 2012-09-20 – 2012-09-22 (×6): 300 mg via ORAL
  Filled 2012-09-19 (×7): qty 1

## 2012-09-19 NOTE — Progress Notes (Signed)
  Echocardiogram 2D Echocardiogram has been performed.  [name] 09/19/2012, 4:52 PM

## 2012-09-19 NOTE — Progress Notes (Addendum)
[facility] PROGRESS NOTE  RUCKER PRIDGEON ZOX:[id number] DOB: 1946/01/25 DOA: 09/17/2012 PCP: Lupita Raider, MD  Assessment/Plan: 1. Fevers -unclear source, blood cultures and urine cultures have been obtained. At this time UA and chest x-ray unremarkable. Orthopantomogram neg for abscess. Flu panel so far neg. Echo done 11/1, follow -Pt's fever curve improving on empiric vancomycin and cefepime for now. Closely follow cultures-so far neg. Sedimentation rate only mildly elevated.  -follow consult ID pending results/clinical course #2. History of hypertension - patient was mildly hypotensive and  her antihypertensives were initially held. blood pressures now elevated, will resume labetalol and norvasc, still holding acei secondary to #3.  #3. ARF on CKD  -improving with hydration, follow and recheck . UA is unremarkable.  #4. Diabetes mellitus type 2 - continue holding metformin. Continue other out pt medications with monitoring of CBGs.  #5. Anemia secondary to chronic kidney disease - follow CBC.  #6. OSA noncompliant with CPAP.   Code Status: full Family Communication: family at bedside  Disposition Plan: to home when stable   Consultants:  none  Procedures:  Echo pending  Antibiotics:  vanc and cefepime started on 10/31  HPI/Subjective: States feeling better this am, denies mouth/tooth pain, also no cough,diarrhea and no dysuria.  Objective: Filed Vitals:   09/19/12 0509 09/19/12 1000 09/19/12 1400 09/19/12 1800  BP: 143/82 140/51 138/60 141/64  Pulse: 95 92 88 90  Temp: 99.8 F (37.7 C) 98.9 F (37.2 C) 98.4 F (36.9 C) 98.6 F (37 C)  TempSrc: Oral Oral Oral Oral  Resp: 22 20 20 20   Height:      Weight:      SpO2: 99% 98% 98% 96%    Intake/Output Summary (Last 24 hours) at 09/19/12 1929 Last data filed at 09/19/12 1900  Gross per 24 hour  Intake   1660 ml  Output   4400 ml  Net  -2740 ml   Filed Weights   09/18/12 0145 09/18/12 2029  Weight:  104.5 kg (230 lb 6.1 oz) 102.8 kg (226 lb 10.1 oz)    Exam:   General:  A&O x3, in NAD  Cardiovascular: RRR  Respiratory: CTA bil  Abdomen: soft +BS, NT/ND  Data Reviewed: Basic Metabolic Panel:  Lab 09/18/12 8119 09/17/12 1510  NA 134* 134*  K 3.7 3.8  CL 103 101  CO2 20 20  GLUCOSE 64* 97  BUN 26* 29*  CREATININE 1.89* 2.33*  CALCIUM 8.6 9.2  MG -- --  PHOS -- --   Liver Function Tests:  Lab 09/18/12 0500 09/17/12 1510  AST 37 32  ALT 20 20  ALKPHOS 45 52  BILITOT 0.4 0.4  PROT 6.9 7.9  ALBUMIN 3.6 4.1   No results found for this basename: LIPASE:5,AMYLASE:5 in the last 168 hours No results found for this basename: AMMONIA:5 in the last 168 hours CBC:  Lab 09/18/12 0500 09/17/12 1510  WBC 9.3 8.9  NEUTROABS 4.2 5.1  HGB 10.2* 11.7*  HCT 29.7* 33.6*  MCV 83.7 83.0  PLT 210 233   Cardiac Enzymes:  Lab 09/18/12 0500 09/17/12 1608  CKTOTAL 310* --  CKMB -- --  CKMBINDEX -- --  TROPONINI -- <0.30   BNP (last 3 results) No results found for this basename: PROBNP:3 in the last 8760 hours CBG:  Lab 09/19/12 1713 09/19/12 1150 09/19/12 0748 09/18/12 2035 09/18/12 1742  GLUCAP 76 103* 83 84 84    Recent Results (from the past 240 hour(s))  CULTURE, BLOOD (ROUTINE X 2)  Status: Normal (Preliminary result)   Collection Time   09/17/12  4:00 PM      Component Value Range Status Comment   Specimen Description BLOOD ARM RIGHT   Final    Special Requests     Final    Value: BOTTLES DRAWN AEROBIC AND ANAEROBIC BLUE 10CC RED 5CC   Culture  Setup Time 09/18/2012 00:11   Final    Culture     Final    Value:        BLOOD CULTURE RECEIVED NO GROWTH TO DATE CULTURE WILL BE HELD FOR 5 DAYS BEFORE ISSUING A FINAL NEGATIVE REPORT   Report Status PENDING   Incomplete   CULTURE, BLOOD (ROUTINE X 2)     Status: Normal (Preliminary result)   Collection Time   09/17/12  4:20 PM      Component Value Range Status Comment   Specimen Description BLOOD HAND LEFT    Final    Special Requests BOTTLES DRAWN AEROBIC ONLY 5CC   Final    Culture  Setup Time 09/18/2012 00:11   Final    Culture     Final    Value:        BLOOD CULTURE RECEIVED NO GROWTH TO DATE CULTURE WILL BE HELD FOR 5 DAYS BEFORE ISSUING A FINAL NEGATIVE REPORT   Report Status PENDING   Incomplete   URINE CULTURE     Status: Normal   Collection Time   09/17/12  6:37 PM      Component Value Range Status Comment   Specimen Description URINE, RANDOM   Final    Special Requests NONE   Final    Culture  Setup Time 09/17/2012 20:10   Final    Colony Count NO GROWTH   Final    Culture NO GROWTH   Final    Report Status 09/18/2012 FINAL   Final   MRSA PCR SCREENING     Status: Normal   Collection Time   09/18/12  2:17 AM      Component Value Range Status Comment   MRSA by PCR NEGATIVE  NEGATIVE Final      Studies: Ct Abdomen Pelvis Wo Contrast  09/17/2012  *RADIOLOGY REPORT*  Clinical Data: Body aches and fever.  Decreased appetite.  CT ABDOMEN AND PELVIS WITHOUT CONTRAST  Technique:  Multidetector CT imaging of the abdomen and pelvis was performed following the standard protocol without intravenous contrast.  Comparison: CT abdomen and pelvis 09/06/2004.  Findings: Lung bases are clear.  No pleural or pericardial effusion.  The liver is low attenuating consistent with fatty infiltration. Single punctate calcification near the dome of the liver is identified.  A small stone is identified within the gallbladder and there is some increased attenuation material present compatible with sludge.  No pericholecystic fluid or stranding is identified. The kidneys appear normal bilaterally.  No renal or ureteral stones and no hydronephrosis.  The adrenal glands, spleen and pancreas are unremarkable.  The stomach and small and large bowel appear normal.  The appendix is not visualized and may have been removed.  There is no lymphadenopathy or fluid.  No focal bony abnormality is identified. The patient has a  right total hip replacement.  IMPRESSION:  1.  No acute finding. 2.  Single small gallstone and likely gallbladder sludge.  No CT evidence of cholecystitis. 3.  Status post right hip replacement. 4.  Fatty infiltration of the liver.   Original Report Authenticated By: Bernadene Bell. Maricela Curet, M.D.  Dg Orthopantogram  09/18/2012  *RADIOLOGY REPORT*  Clinical Data: Fever.  Swelling in the roof of the mouth.  ORTHOPANTOGRAM/PANORAMIC  Comparison: None.  Findings: There is no evidence of periapical abscess or other acute abnormality. Multiple root canals are noted.  Multiple metallic restorations.  Multiple missing teeth.   IMPRESSION: No acute abnormalities.  Specifically, no evidence of periapical abscess.   Original Report Authenticated By: Francene Boyers, M.D.    Dg Chest 2 View  09/18/2012  *RADIOLOGY REPORT*  Clinical Data: Fever, chills and weakness.  CHEST - 2 VIEW  Comparison: PA and lateral chest 09/16/2012 and 08/18/2004.  Findings: Lungs are clear.  Heart size is normal.  No pneumothorax or pleural effusion.  IMPRESSION: No acute disease.   Original Report Authenticated By: Bernadene Bell. D'ALESSIO, M.D.     Scheduled Meds:   . amitriptyline  50 mg Oral QHS  . ceFEPime (MAXIPIME) IV  1 g Intravenous QHS  . colchicine  0.6 mg Oral Daily  . enoxaparin (LOVENOX) injection  50 mg Subcutaneous Q24H  . ezetimibe-simvastatin  1 tablet Oral QHS  . febuxostat  40 mg Oral Daily  . fenofibrate  160 mg Oral Daily  . glipiZIDE  10 mg Oral QAC breakfast  . influenza  inactive virus vaccine  0.5 mL Intramuscular Tomorrow-1000  . insulin aspart  0-9 Units Subcutaneous TID WC  . insulin glargine  34 Units Subcutaneous Daily  . sodium chloride  3 mL Intravenous Q12H  . vancomycin  1,500 mg Intravenous Q24H   Continuous Infusions:   . sodium chloride 1,000 mL (09/19/12 1004)  . sodium chloride 125 mL/hr at 09/18/12 0225    Principal Problem:  *Fever Active Problems:  DM (diabetes mellitus)  CKD  (chronic kidney disease)  Anemia  HTN (hypertension)  Hyperlipidemia    Time spent:64mins    Caral Whan C  [facility] Pager [phone]. If 8PM-8AM, please contact night-coverage at [email or web address], password [facility] 09/19/2012, 7:29 PM  LOS: 2 days

## 2012-09-20 ENCOUNTER — Inpatient Hospital Stay (HOSPITAL_COMMUNITY): Payer: BC Managed Care – PPO

## 2012-09-20 LAB — CBC
HCT: 27.6 % — ABNORMAL LOW (ref 39.0–52.0)
MCH: 28.9 pg (ref 26.0–34.0)
MCHC: 34.8 g/dL (ref 30.0–36.0)
MCV: 83.1 fL (ref 78.0–100.0)
Platelets: 186 10*3/uL (ref 150–400)
RDW: 12.5 % (ref 11.5–15.5)
WBC: 4.7 10*3/uL (ref 4.0–10.5)

## 2012-09-20 LAB — BASIC METABOLIC PANEL
BUN: 15 mg/dL (ref 6–23)
CO2: 22 mEq/L (ref 19–32)
Calcium: 8.5 mg/dL (ref 8.4–10.5)
Chloride: 105 mEq/L (ref 96–112)
Creatinine, Ser: 1.41 mg/dL — ABNORMAL HIGH (ref 0.50–1.35)

## 2012-09-20 MED ORDER — POTASSIUM CHLORIDE CRYS ER 20 MEQ PO TBCR
30.0000 meq | EXTENDED_RELEASE_TABLET | ORAL | Status: AC
  Administered 2012-09-20 (×2): 30 meq via ORAL
  Filled 2012-09-20 (×2): qty 1

## 2012-09-20 MED ORDER — POTASSIUM CHLORIDE CRYS ER 20 MEQ PO TBCR: 40.0000 meq | EXTENDED_RELEASE_TABLET | ORAL | Status: DC

## 2012-09-20 MED ORDER — SODIUM CHLORIDE 0.9 % IV SOLN
3.0000 g | Freq: Four times a day (QID) | INTRAVENOUS | Status: DC
  Administered 2012-09-20 – 2012-09-22 (×8): 3 g via INTRAVENOUS
  Filled 2012-09-20 (×9): qty 3

## 2012-09-20 NOTE — Consult Note (Addendum)
[facility]  Total days of antibiotics 4        Day 4 cefepime        Day 4 vanco               Reason for Consult: unknown source of fever    Referring Physician: [email or web address]  Principal Problem:  *Fever Active Problems:  DM (diabetes mellitus)  CKD (chronic kidney disease)  Anemia  HTN (hypertension)  Hyperlipidemia    HPI: [name] is a 66 y.o. male with history of DM, HTN, CKD presented with fevers, chills, localized myalgias for 1 week. He was noted to have had root canal 3 wks ago which was complicated by abscess required i x d/lancing and being placed on amoxicillin. During the week prior to admission, still having chills, low grade fever, occasionally lightheaded. He states that he had tenderness to his right shoulder in rotating it, achiness to right quadricep and sensitivity to his scalp which were additional symptoms he had to his fevers. Since he was not feeling better, The patient was seen day prior to admit by PCP who started him on amox/clav.his wife took him to the ED since he had a fever of 103F of rigors. In ED, he did have sBP at 90, tachycardia, with HR 110s, and fever of 103, which responded to antipyretics, IVF and antibiotics. All infectious work up has been negative. Repeat imaging of maxilla-facial is negative for abscess to head and neck.   Patient states 3 [place] 2 [place] with his wife. Denies noticing any insect bites. Did not go on any long hikes  Patient has been having increasing joint pains but again the right shoulder and knee. Also has been having myalgia and also periumbilical pain. CT abdomen pelvis done in the ER was unremarkable.   Past Medical History  Diagnosis Date  . Anemia   . Renal insufficiency   . Hypertension   . Gout   . Diabetes mellitus   . Hypercholesterolemia   . Diverticulosis   . Sleep apnea   . History of nonmelanoma skin cancer     Allergies:  No Known Allergies  MEDICATIONS:    . amitriptyline  50 mg Oral QHS  . amLODipine  10 mg Oral Daily  . ceFEPime (MAXIPIME) IV  1 g Intravenous QHS  . colchicine  0.6 mg Oral Daily  . enoxaparin (LOVENOX) injection  50 mg Subcutaneous Q24H  . ezetimibe-simvastatin  1 tablet Oral QHS  . febuxostat  40 mg Oral Daily  . fenofibrate  160 mg Oral Daily  . glipiZIDE  10 mg Oral QAC breakfast  . insulin aspart  0-9 Units Subcutaneous TID WC  . insulin glargine  34 Units Subcutaneous Daily  . labetalol  300 mg Oral BID  . sodium chloride  3 mL Intravenous Q12H  . DISCONTD: vancomycin  1,500 mg Intravenous Q24H    History  Substance Use Topics  . Smoking status: Never Smoker   . Smokeless tobacco: Not on file  . Alcohol Use: No    History reviewed. No pertinent family history.   Review of Systems  Constitutional: Negative for fever, chills, diaphoresis, activity change, appetite change, fatigue and unexpected weight change.  HENT: Negative for congestion, sore throat, rhinorrhea, sneezing, trouble swallowing and sinus pressure.  Eyes: Negative for photophobia and visual disturbance.  Respiratory: Negative for cough, chest tightness, shortness of breath, wheezing  and stridor.  Cardiovascular: Negative for chest pain, palpitations and leg swelling.  Gastrointestinal: Negative for nausea, vomiting, abdominal pain, diarrhea, constipation, blood in stool, abdominal distention and anal bleeding.  Genitourinary: Negative for dysuria, hematuria, flank pain and difficulty urinating.  Musculoskeletal: Negative for myalgias, back pain, joint swelling, arthralgias and gait problem.  Skin: Negative for color change, pallor, rash and wound.  Neurological: Negative for dizziness, tremors, weakness and light-headedness.  Hematological: Negative for adenopathy. Does not bruise/bleed easily.  Psychiatric/Behavioral: Negative for behavioral problems, confusion, sleep disturbance, dysphoric mood,  decreased concentration and agitation.     OBJECTIVE: Temp:  [98.6 F (37 C)-99.5 F (37.5 C)] 98.9 F (37.2 C) (11/02 1000) Pulse Rate:  [86-108] 86  (11/02 1000) Resp:  [20] 20  (11/02 1000) BP: (141-165)/(64-80) 157/80 mmHg (11/02 1000) SpO2:  [96 %-98 %] 98 % (11/02 1000) Weight:  [223 lb (101.152 kg)] 223 lb (101.152 kg) (11/01 2201)   General Appearance:    Alert, cooperative, no distress, appears stated age  Head:    Normocephalic, without obvious abnormality, atraumatic  Eyes:    PERRL, conjunctiva/corneas clear, EOM's intact,     Nose:   Nares normal, septum midline, mucosa normal, no drainage   or sinus tenderness  Throat:   Lips, mucosa, and tongue normal; teeth and gums normal. Palpable fluctuance to gum line on lingual aspect of upper jaw at first molar/root canal-tooth  Neck:   Supple, symmetrical, trachea midline, no adenopathy;         Back:     Symmetric, no curvature, ROM normal, no CVA tenderness  Lungs:     Clear to auscultation bilaterally, respirations unlabored  Chest wall:    No tenderness or deformity  Heart:    Regular rate and rhythm, S1 and S2 normal, no murmur, rub   or gallop  Abdomen:     Soft, non-tender, bowel sounds active all four quadrants,    no masses, no organomegaly        Extremities:   Extremities normal, atraumatic, no cyanosis or edema  Pulses:   2+ and symmetric all extremities  Skin:   Skin color, texture, turgor normal, no rashes or lesions  Lymph nodes:   Cervical, supraclavicular, and axillary nodes normal  Neurologic:   CNII-XII intact. Normal strength, sensation and reflexes      throughout   LABS: Results for orders placed during the hospital encounter of 09/17/12 (from the past 48 hour(s))  SEDIMENTATION RATE     Status: Abnormal   Collection Time   09/18/12  4:39 PM      Component Value Range Comment   Sed Rate 45 (*) 0 - 16 mm/hr   C-REACTIVE PROTEIN     Status: Abnormal   Collection Time   09/18/12  4:39 PM       Component Value Range Comment   CRP 0.5 (*) <0.60 mg/dL   GLUCOSE, CAPILLARY     Status: Normal   Collection Time   09/18/12  5:42 PM      Component Value Range Comment   Glucose-Capillary 84  70 - 99 mg/dL    Comment 1 Notify RN      Comment 2 Documented in Chart     GLUCOSE, CAPILLARY     Status: Normal   Collection Time   09/18/12  8:35 PM      Component Value Range Comment   Glucose-Capillary 84  70 - 99 mg/dL   GLUCOSE, CAPILLARY     Status:  Normal   Collection Time   09/19/12  7:48 AM      Component Value Range Comment   Glucose-Capillary 83  70 - 99 mg/dL   GLUCOSE, CAPILLARY     Status: Abnormal   Collection Time   09/19/12 11:50 AM      Component Value Range Comment   Glucose-Capillary 103 (*) 70 - 99 mg/dL   GLUCOSE, CAPILLARY     Status: Normal   Collection Time   09/19/12  5:13 PM      Component Value Range Comment   Glucose-Capillary 76  70 - 99 mg/dL   GLUCOSE, CAPILLARY     Status: Abnormal   Collection Time   09/19/12  9:55 PM      Component Value Range Comment   Glucose-Capillary 109 (*) 70 - 99 mg/dL    Comment 1 Notify RN     BASIC METABOLIC PANEL     Status: Abnormal   Collection Time   09/20/12  5:20 AM      Component Value Range Comment   Sodium 137  135 - 145 mEq/L    Potassium 3.3 (*) 3.5 - 5.1 mEq/L    Chloride 105  96 - 112 mEq/L    CO2 22  19 - 32 mEq/L    Glucose, Bld 77  70 - 99 mg/dL    BUN 15  6 - 23 mg/dL    Creatinine, Ser 4.09 (*) 0.50 - 1.35 mg/dL    Calcium 8.5  8.4 - 81.1 mg/dL    GFR calc non Af Amer 50 (*) >90 mL/min    GFR calc Af Amer 58 (*) >90 mL/min   CBC     Status: Abnormal   Collection Time   09/20/12  5:20 AM      Component Value Range Comment   WBC 4.7  4.0 - 10.5 K/uL    RBC 3.32 (*) 4.22 - 5.81 MIL/uL    Hemoglobin 9.6 (*) 13.0 - 17.0 g/dL    HCT 91.4 (*) 78.2 - 52.0 %    MCV 83.1  78.0 - 100.0 fL    MCH 28.9  26.0 - 34.0 pg    MCHC 34.8  30.0 - 36.0 g/dL    RDW 95.6  21.3 - 08.6 %    Platelets 186  150 - 400  K/uL   GLUCOSE, CAPILLARY     Status: Normal   Collection Time   09/20/12  8:07 AM      Component Value Range Comment   Glucose-Capillary 96  70 - 99 mg/dL   GLUCOSE, CAPILLARY     Status: Abnormal   Collection Time   09/20/12 11:52 AM      Component Value Range Comment   Glucose-Capillary 108 (*) 70 - 99 mg/dL     MICRO: 57/84 blood cx x 2 NGTD 10/30 urine cx NGTD IMAGING: Dg Shoulder Right  09/20/2012  *RADIOLOGY REPORT*  Clinical Data: Shoulder pain  RIGHT SHOULDER - 2+ VIEW  Comparison: None.  Findings: Normal alignment without fracture.  AC joint aligned. Right lung clear.  Visualized right ribs unremarkable.  IMPRESSION: No acute finding.   Original Report Authenticated By: Judie Petit. Miles Costain, M.D.    Ct Maxillofacial W/cm  09/20/2012  *RADIOLOGY REPORT*  Clinical Data: The patient had a root canal 3 weeks ago of the first left upper tooth.  Sub spleen developed an abscess which was glands by the dental surgeon.  The patient was placed on antibiotics.  The patient  has then developed fever and chills with weakness for few days.  CT MAXILLOFACIAL WITH CONTRAST  Technique:  Multidetector CT imaging of the maxillofacial structures was performed with intravenous contrast. Multiplanar CT image reconstructions were also generated.  Contrast: 65mL OMNIPAQUE IOHEXOL 300 MG/ML  SOLN  Comparison: Orthopantogram 09/18/2012  Findings: Visualization of the area of interest is limited due to streak artifact arising from dental hardware.  There is of vague suggestion of some bone loss around the outer table of the left mandible in the region of the left lower molars.  Periodontal disease is not excluded.  There is no evidence of bone loss or peri apical lucency or abscess in the maxillary region as visualized. No definable soft tissue swelling or contrast enhancement.  No discrete abscess is visualized.  Small retention cysts and mucous membrane thickening in the right maxillary antrum.  Opacification of some ethmoid  air cells. Mastoid air cells are not opacified.  No acute air-fluid levels.  IMPRESSION: No definite evidence of periodontal abscess although visualization of the area of interest is limited due to streak artifact arising from dental hardware.  Suggestion of possible bone loss in the left mandible in the molar region.   Original Report Authenticated By: Burman Nieves, M.D.     Assessment/Plan: 65 yo male who presents with 1 week history of fever, chills, myalgias, now on empiric antibiotics but no source found. Prior to hospitalization he had been on oral antibiotics for presumed tooth abscess with amoxicillin x 2 1/2 wks.  - unclear if still having fevers since he is receiving tylenol. Please stop using tylenol to see if it is masking his fever. - recommend to change cefepime to unasyn for better coverage for oral flora. - may consider getting dental to eval to see if need to aspirate fluid collection? On oral exam.  - if he undergoes aspiration, please send culture for aerobic/anaerobic; hold longer concern for actinomyces. - if he remains afebrile, would change IV antibiotics to amox/clav - if he has repeat temp > 100.3, please repeat blood cultures.  - unclear if unilateral quad pain, shoulder pain, and scalp sensitivity is related to his fever, will check CK to see if related to myalgia due to drug side effect. - shoulder exam does not appear significantly limited to be concern for bursitis, if worsens, would get aspirate and check cell count.  Duke Salvia Drue Second MD MPH [facility] [phone]

## 2012-09-20 NOTE — Progress Notes (Signed)
[facility] PROGRESS NOTE  [name] ION:[id number] DOB: 05/08/1946 DOA: 09/17/2012 PCP: Lupita Raider, MD  Assessment/Plan: 1. Fevers -unclear source, blood cultures and urine cultures have been obtained. At this time UA and chest x-ray unremarkable. Orthopantomogram neg for abscess. Flu panel so far neg. Echo done 11/1 with no evidence of vegetations -appreciate ID input, tylenol dc'ed, and abx changed to unasyn. Closely follow cultures-so far neg. Called and left message for the dentist Adria Devon that drained abscess initially-await call back. Sedimentation rate only mildly elevated.  #2. History of hypertension - patient was mildly hypotensive and  her antihypertensives were initially held. blood pressures now elevated, will resume labetalol and norvasc, still holding acei secondary to #3.  #3. ARF on CKD  -continues to improve with hydration, follow and recheck . UA is unremarkable.  #4. Diabetes mellitus type 2 - continue holding metformin. Continue other out pt medications with monitoring of CBGs.  #5. Anemia secondary to chronic kidney disease - hgb stable  #6.OSA noncompliant with CPAP. #7.hypokalemia - replace k  Code Status: full Family Communication: family at bedside  Disposition Plan: to home when stable   Consultants:  ID  Procedures:  Echo  Study Conclusions  - Left ventricle: The cavity size was normal. Wall thickness was increased in a pattern of mild LVH. The estimated ejection fraction was 60%. Wall motion was normal; there were no regional wall motion abnormalities. - Left atrium: The atrium was mildly dilated. - Impressions: Technically difficult study, but no obvious vegetations seen. Impressions:  - Technically difficult study, but no obvious vegetations seen.   Antibiotics:  vanc and cefepime started on 10/31>>11/2  UNASYN started 11/2  HPI/Subjective: States the area in his tooth/gum felt swollen last pm, but today less  swollen.  Objective: Filed Vitals:   09/19/12 2201 09/20/12 0542 09/20/12 1000 09/20/12 1400  BP: 165/77 160/72 157/80 144/88  Pulse: 108 86 86 82  Temp: 99.5 F (37.5 C) 98.9 F (37.2 C) 98.9 F (37.2 C) 97.8 F (36.6 C)  TempSrc: Oral Oral Oral Oral  Resp: 20 20 20 20   Height:      Weight: 101.152 kg (223 lb)     SpO2: 97% 98% 98% 96%    Intake/Output Summary (Last 24 hours) at 09/20/12 1637 Last data filed at 09/20/12 1500  Gross per 24 hour  Intake 4072.5 ml  Output   3550 ml  Net  522.5 ml   Filed Weights   09/18/12 0145 09/18/12 2029 09/19/12 2201  Weight: 104.5 kg (230 lb 6.1 oz) 102.8 kg (226 lb 10.1 oz) 101.152 kg (223 lb)    Exam:   General:  A&O x3, in NAD  Cardiovascular: RRR  Respiratory: CTA bil  Abdomen: soft +BS, NT/ND  Data Reviewed: Basic Metabolic Panel:  Lab 09/20/12 2440 09/18/12 0500 09/17/12 1510  NA 137 134* 134*  K 3.3* 3.7 3.8  CL 105 103 101  CO2 22 20 20   GLUCOSE 77 64* 97  BUN 15 26* 29*  CREATININE 1.41* 1.89* 2.33*  CALCIUM 8.5 8.6 9.2  MG -- -- --  PHOS -- -- --   Liver Function Tests:  Lab 09/18/12 0500 09/17/12 1510  AST 37 32  ALT 20 20  ALKPHOS 45 52  BILITOT 0.4 0.4  PROT 6.9 7.9  ALBUMIN 3.6 4.1   No results found for this basename: LIPASE:5,AMYLASE:5 in the last 168 hours No results found for this basename: AMMONIA:5 in the last 168 hours CBC:  Lab 09/20/12  1610 09/18/12 0500 09/17/12 1510  WBC 4.7 9.3 8.9  NEUTROABS -- 4.2 5.1  HGB 9.6* 10.2* 11.7*  HCT 27.6* 29.7* 33.6*  MCV 83.1 83.7 83.0  PLT 186 210 233   Cardiac Enzymes:  Lab 09/18/12 0500 09/17/12 1608  CKTOTAL 310* --  CKMB -- --  CKMBINDEX -- --  TROPONINI -- <0.30   BNP (last 3 results) No results found for this basename: PROBNP:3 in the last 8760 hours CBG:  Lab 09/20/12 1152 09/20/12 0807 09/19/12 2155 09/19/12 1713 09/19/12 1150  GLUCAP 108* 96 109* 76 103*    Recent Results (from the past 240 hour(s))  CULTURE, BLOOD  (ROUTINE X 2)     Status: Normal (Preliminary result)   Collection Time   09/17/12  4:00 PM      Component Value Range Status Comment   Specimen Description BLOOD ARM RIGHT   Final    Special Requests     Final    Value: BOTTLES DRAWN AEROBIC AND ANAEROBIC BLUE 10CC RED 5CC   Culture  Setup Time 09/18/2012 00:11   Final    Culture     Final    Value:        BLOOD CULTURE RECEIVED NO GROWTH TO DATE CULTURE WILL BE HELD FOR 5 DAYS BEFORE ISSUING A FINAL NEGATIVE REPORT   Report Status PENDING   Incomplete   CULTURE, BLOOD (ROUTINE X 2)     Status: Normal (Preliminary result)   Collection Time   09/17/12  4:20 PM      Component Value Range Status Comment   Specimen Description BLOOD HAND LEFT   Final    Special Requests BOTTLES DRAWN AEROBIC ONLY 5CC   Final    Culture  Setup Time 09/18/2012 00:11   Final    Culture     Final    Value:        BLOOD CULTURE RECEIVED NO GROWTH TO DATE CULTURE WILL BE HELD FOR 5 DAYS BEFORE ISSUING A FINAL NEGATIVE REPORT   Report Status PENDING   Incomplete   URINE CULTURE     Status: Normal   Collection Time   09/17/12  6:37 PM      Component Value Range Status Comment   Specimen Description URINE, RANDOM   Final    Special Requests NONE   Final    Culture  Setup Time 09/17/2012 20:10   Final    Colony Count NO GROWTH   Final    Culture NO GROWTH   Final    Report Status 09/18/2012 FINAL   Final   MRSA PCR SCREENING     Status: Normal   Collection Time   09/18/12  2:17 AM      Component Value Range Status Comment   MRSA by PCR NEGATIVE  NEGATIVE Final      Studies: Dg Shoulder Right  09/20/2012  *RADIOLOGY REPORT*  Clinical Data: Shoulder pain  RIGHT SHOULDER - 2+ VIEW  Comparison: None.  Findings: Normal alignment without fracture.  AC joint aligned. Right lung clear.  Visualized right ribs unremarkable.  IMPRESSION: No acute finding.   Original Report Authenticated By: Judie Petit. Miles Costain, M.D.    Ct Maxillofacial W/cm  09/20/2012  *RADIOLOGY REPORT*   Clinical Data: The patient had a root canal 3 weeks ago of the first left upper tooth.  Sub spleen developed an abscess which was glands by the dental surgeon.  The patient was placed on antibiotics.  The patient has then developed fever  and chills with weakness for few days.  CT MAXILLOFACIAL WITH CONTRAST  Technique:  Multidetector CT imaging of the maxillofacial structures was performed with intravenous contrast. Multiplanar CT image reconstructions were also generated.  Contrast: 65mL OMNIPAQUE IOHEXOL 300 MG/ML  SOLN  Comparison: Orthopantogram 09/18/2012  Findings: Visualization of the area of interest is limited due to streak artifact arising from dental hardware.  There is of vague suggestion of some bone loss around the outer table of the left mandible in the region of the left lower molars.  Periodontal disease is not excluded.  There is no evidence of bone loss or peri apical lucency or abscess in the maxillary region as visualized. No definable soft tissue swelling or contrast enhancement.  No discrete abscess is visualized.  Small retention cysts and mucous membrane thickening in the right maxillary antrum.  Opacification of some ethmoid air cells. Mastoid air cells are not opacified.  No acute air-fluid levels.  IMPRESSION: No definite evidence of periodontal abscess although visualization of the area of interest is limited due to streak artifact arising from dental hardware.  Suggestion of possible bone loss in the left mandible in the molar region.   Original Report Authenticated By: Burman Nieves, M.D.     Scheduled Meds:    . amitriptyline  50 mg Oral QHS  . amLODipine  10 mg Oral Daily  . ampicillin-sulbactam (UNASYN) IV  3 g Intravenous Q6H  . colchicine  0.6 mg Oral Daily  . enoxaparin (LOVENOX) injection  50 mg Subcutaneous Q24H  . ezetimibe-simvastatin  1 tablet Oral QHS  . febuxostat  40 mg Oral Daily  . fenofibrate  160 mg Oral Daily  . glipiZIDE  10 mg Oral QAC breakfast  .  insulin aspart  0-9 Units Subcutaneous TID WC  . insulin glargine  34 Units Subcutaneous Daily  . labetalol  300 mg Oral BID  . sodium chloride  3 mL Intravenous Q12H  . DISCONTD: ceFEPime (MAXIPIME) IV  1 g Intravenous QHS  . DISCONTD: vancomycin  1,500 mg Intravenous Q24H   Continuous Infusions:    . sodium chloride 1,000 mL (09/20/12 1420)    Principal Problem:  *Fever Active Problems:  DM (diabetes mellitus)  CKD (chronic kidney disease)  Anemia  HTN (hypertension)  Hyperlipidemia    Time spent:48mins    Derryl Uher C  [facility] Pager [phone]. If 8PM-8AM, please contact night-coverage at [email or web address], password [facility] 09/20/2012, 4:37 PM  LOS: 3 days

## 2012-09-20 NOTE — Progress Notes (Signed)
ANTIBIOTIC CONSULT NOTE - INITIAL  Pharmacy Consult for Unasyn Indication: Possible Tooth Abscess  No Known Allergies  Patient Measurements: Height: 5' 10.08" (178 cm) Weight: 223 lb (101.152 kg) IBW/kg (Calculated) : 73.18   Vital Signs: Temp: 97.8 F (36.6 C) (11/02 1400) Temp src: Oral (11/02 1400) BP: 144/88 mmHg (11/02 1400) Pulse Rate: 82  (11/02 1400) Intake/Output from previous day: 11/01 0701 - 11/02 0700 In: 3680 [P.O.:1180; I.V.:2500] Out: 3375 [Urine:3375] Intake/Output from this shift: Total I/O In: 872.5 [P.O.:360; I.V.:512.5] Out: 2000 [Urine:2000]  Labs:  [facility] 09/20/12 0520 09/18/12 0500  WBC 4.7 9.3  HGB 9.6* 10.2*  PLT 186 210  LABCREA -- --  CREATININE 1.41* 1.89*   Estimated Creatinine Clearance: 60.7 ml/min (by C-G formula based on Cr of 1.41). No results found for this basename: VANCOTROUGH:2,VANCOPEAK:2,VANCORANDOM:2,GENTTROUGH:2,GENTPEAK:2,GENTRANDOM:2,TOBRATROUGH:2,TOBRAPEAK:2,TOBRARND:2,AMIKACINPEAK:2,AMIKACINTROU:2,AMIKACIN:2, in the last 72 hours   Microbiology: Recent Results (from the past 720 hour(s))  CULTURE, BLOOD (ROUTINE X 2)     Status: Normal (Preliminary result)   Collection Time   09/17/12  4:00 PM      Component Value Range Status Comment   Specimen Description BLOOD ARM RIGHT   Final    Special Requests     Final    Value: BOTTLES DRAWN AEROBIC AND ANAEROBIC BLUE 10CC RED 5CC   Culture  Setup Time 09/18/2012 00:11   Final    Culture     Final    Value:        BLOOD CULTURE RECEIVED NO GROWTH TO DATE CULTURE WILL BE HELD FOR 5 DAYS BEFORE ISSUING A FINAL NEGATIVE REPORT   Report Status PENDING   Incomplete   CULTURE, BLOOD (ROUTINE X 2)     Status: Normal (Preliminary result)   Collection Time   09/17/12  4:20 PM      Component Value Range Status Comment   Specimen Description BLOOD HAND LEFT   Final    Special Requests BOTTLES DRAWN AEROBIC ONLY 5CC   Final    Culture  Setup Time 09/18/2012 00:11   Final    Culture     Final    Value:        BLOOD CULTURE RECEIVED NO GROWTH TO DATE CULTURE WILL BE HELD FOR 5 DAYS BEFORE ISSUING A FINAL NEGATIVE REPORT   Report Status PENDING   Incomplete   URINE CULTURE     Status: Normal   Collection Time   09/17/12  6:37 PM      Component Value Range Status Comment   Specimen Description URINE, RANDOM   Final    Special Requests NONE   Final    Culture  Setup Time 09/17/2012 20:10   Final    Colony Count NO GROWTH   Final    Culture NO GROWTH   Final    Report Status 09/18/2012 FINAL   Final   MRSA PCR SCREENING     Status: Normal   Collection Time   09/18/12  2:17 AM      Component Value Range Status Comment   MRSA by PCR NEGATIVE  NEGATIVE Final     Medical History: Past Medical History  Diagnosis Date  . Anemia   . Renal insufficiency   . Hypertension   . Gout   . Diabetes mellitus   . Hypercholesterolemia   . Diverticulosis   . Sleep apnea   . History of nonmelanoma skin cancer     Medications:  Anti-infectives     Start     Dose/Rate  Route Frequency Ordered Stop   09/20/12 1645   Ampicillin-Sulbactam (UNASYN) 3 g in sodium chloride 0.9 % 100 mL IVPB        3 g 100 mL/hr over 60 Minutes Intravenous Every 6 hours 09/20/12 1635     09/18/12 2200   ceFEPIme (MAXIPIME) 1 g in dextrose 5 % 50 mL IVPB  Status:  Discontinued        1 g 100 mL/hr over 30 Minutes Intravenous Daily at bedtime 09/18/12 0200 09/20/12 1635   09/18/12 1200   vancomycin (VANCOCIN) IVPB 1000 mg/200 mL premix  Status:  Discontinued        1,000 mg 200 mL/hr over 60 Minutes Intravenous Every 24 hours 09/18/12 0200 09/18/12 1009   09/18/12 1200   vancomycin (VANCOCIN) 1,500 mg in sodium chloride 0.9 % 500 mL IVPB  Status:  Discontinued        1,500 mg 250 mL/hr over 120 Minutes Intravenous Every 24 hours 09/18/12 1009 09/20/12 0912   09/18/12 0215   ceFEPIme (MAXIPIME) 1 g in dextrose 5 % 50 mL IVPB        1 g 100 mL/hr over 30 Minutes Intravenous  Once  09/18/12 0159 09/18/12 0314   09/18/12 0015   vancomycin (VANCOCIN) IVPB 1000 mg/200 mL premix        1,000 mg 200 mL/hr over 60 Minutes Intravenous  Once 09/18/12 0013 09/18/12 0130   09/18/12 0015   piperacillin-tazobactam (ZOSYN) IVPB 3.375 g  Status:  Discontinued        3.375 g 12.5 mL/hr over 240 Minutes Intravenous  Once 09/18/12 0013 09/18/12 0159         Assessment: 66 YOM with possible tooth abscess s/p 3 days of vancomycin and cefepime now switching to Unasyn per pharmacy dosing. WBC wnl. Tmax is 99.5- receiving tylenol. Orthopantomogram negative for abscess. Infectious disease following and considering dental evaluation. Good UOP. SCr 1.41- improving. Current CrCl~60.   Goal of Therapy:  Clinical resolution of infection  Plan:  1. Continue Unasyn 3g IV q6h.  2. Follow-up renal function closely. 3. Follow-up fever curve and clinical response.   [name] 09/20/2012,4:49 PM

## 2012-09-21 DIAGNOSIS — R509 Fever, unspecified: Secondary | ICD-10-CM

## 2012-09-21 LAB — BASIC METABOLIC PANEL
BUN: 12 mg/dL (ref 6–23)
Calcium: 8.7 mg/dL (ref 8.4–10.5)
GFR calc non Af Amer: 50 mL/min — ABNORMAL LOW (ref >90)
Glucose, Bld: 73 mg/dL (ref 70–99)

## 2012-09-21 LAB — GLUCOSE, CAPILLARY: Glucose-Capillary: 90 mg/dL (ref 70–99)

## 2012-09-21 MED ORDER — GLUCOSE 40 % PO GEL
ORAL | Status: AC
  Filled 2012-09-21: qty 1

## 2012-09-21 MED ORDER — GLUCOSE 40 % PO GEL: 1.0000 | ORAL | Status: DC | PRN

## 2012-09-21 MED ORDER — INSULIN GLARGINE 100 UNIT/ML ~~LOC~~ SOLN
28.0000 [IU] | Freq: Every day | SUBCUTANEOUS | Status: DC
  Administered 2012-09-22: 20 [IU] via SUBCUTANEOUS

## 2012-09-21 NOTE — Progress Notes (Signed)
Hypoglycemic Event  CBG: 69  Treatment: 15 GRAM OF GLUCO GEL AND 8 OZ OF GINGER ALE  Symptoms:  Follow-up CBG: Time:1613 CBG Result:82   Possible Reasons for Event: UNKNOWN  Comments/MD notified: YES    [name], Kem Kays  Remember to initiate Hypoglycemia Order Set & complete

## 2012-09-21 NOTE — Progress Notes (Signed)
[facility]    Date of Admission:  09/17/2012    Total days of antibiotics 4 Day 1 amp/sub  4 days cefepime (d/c'd 11/2) 4 days vanco(d/c'd 11/2)     ID: [name] is a 66 y.o. male  who presents with 1 week history of fever, chills, myalgias, now on empiric antibiotics but no source found. Prior to hospitalization he had been on oral antibiotics for presumed tooth abscess with amoxicillin x 2 1/2 wks.  Principal Problem:  *Fever Active Problems:  DM (diabetes mellitus)  CKD (chronic kidney disease)  Anemia  HTN (hypertension)  Hyperlipidemia    Subjective: No fevers overnight. No tylenol received. Upper tooth gumline swelling somewhat improved  Medications:     . amitriptyline  50 mg Oral QHS  . amLODipine  10 mg Oral Daily  . ampicillin-sulbactam (UNASYN) IV  3 g Intravenous Q6H  . colchicine  0.6 mg Oral Daily  . enoxaparin (LOVENOX) injection  50 mg Subcutaneous Q24H  . ezetimibe-simvastatin  1 tablet Oral QHS  . febuxostat  40 mg Oral Daily  . fenofibrate  160 mg Oral Daily  . glipiZIDE  10 mg Oral QAC breakfast  . insulin aspart  0-9 Units Subcutaneous TID WC  . insulin glargine  34 Units Subcutaneous Daily  . labetalol  300 mg Oral BID  . [COMPLETED] potassium chloride  30 mEq Oral Q4H  . sodium chloride  3 mL Intravenous Q12H  . [DISCONTINUED] ceFEPime (MAXIPIME) IV  1 g Intravenous QHS  . [DISCONTINUED] potassium chloride  40 mEq Oral Q4H    Objective: Vital signs in last 24 hours: Temp:  [97.8 F (36.6 C)-99.3 F (37.4 C)] 98 F (36.7 C) (11/03 1000) Pulse Rate:  [82-89] 86  (11/03 1000) Resp:  [20] 20  (11/03 1000) BP: (144-152)/(73-88) 152/78 mmHg (11/03 1000) SpO2:  [96 %-99 %] 98 % (11/03 1000) Weight:  [224 lb 6.9 oz (101.8 kg)] 224 lb 6.9 oz (101.8 kg) (11/02 2122) Physical Exam  Constitutional: He is oriented to person, place, and time. He appears well-developed and well-nourished. No distress.  HENT:    Mouth/Throat: Oropharynx is clear and moist. No oropharyngeal exudate. Fluctuant area of upper 1st molar on the right is smaller, no erythema. Cardiovascular: Normal rate, regular rhythm and normal heart sounds. Exam reveals no gallop and no friction rub.  No murmur heard.  Pulmonary/Chest: Effort normal and breath sounds normal. No respiratory distress. He has no wheezes.  Abdominal: Soft. Bowel sounds are normal. He exhibits no distension. There is no tenderness.  Lymphadenopathy:  He has no cervical adenopathy.  Neurological: He is alert and oriented to person, place, and time.  Skin: Skin is warm and dry. No rash noted. No erythema.  Psychiatric: He has a normal mood and affect. His behavior is normal.    Lab Results  Basename 09/21/12 0600 09/20/12 0520  WBC -- 4.7  HGB -- 9.6*  HCT -- 27.6*  NA 136 137  K 3.9 3.3*  CL 104 105  CO2 22 22  BUN 12 15  CREATININE 1.40* 1.41*  GLU -- --   Liver Panel No results found for this basename: PROT:2,ALBUMIN:2,AST:2,ALT:2,ALKPHOS:2,BILITOT:2,BILIDIR:2,IBILI:2 in the last 72 hours Sedimentation Rate  Basename 09/18/12 1639  ESRSEDRATE 45*   C-Reactive Protein  Basename 09/18/12 1639  CRP 0.5*    Microbiology: 10/30 blood cx x 2 NGTD  10/30 urine cx NGTD   Studies/Results: Dg Shoulder Right  09/20/2012  *RADIOLOGY  REPORT*  Clinical Data: Shoulder pain  RIGHT SHOULDER - 2+ VIEW  Comparison: None.  Findings: Normal alignment without fracture.  AC joint aligned. Right lung clear.  Visualized right ribs unremarkable.  IMPRESSION: No acute finding.   Original Report Authenticated By: Judie Petit. Miles Costain, M.D.    Ct Maxillofacial W/cm  09/20/2012  *RADIOLOGY REPORT*  Clinical Data: The patient had a root canal 3 weeks ago of the first left upper tooth.  Sub spleen developed an abscess which was glands by the dental surgeon.  The patient was placed on antibiotics.  The patient has then developed fever and chills with weakness for few days.  CT  MAXILLOFACIAL WITH CONTRAST  Technique:  Multidetector CT imaging of the maxillofacial structures was performed with intravenous contrast. Multiplanar CT image reconstructions were also generated.  Contrast: 65mL OMNIPAQUE IOHEXOL 300 MG/ML  SOLN  Comparison: Orthopantogram 09/18/2012  Findings: Visualization of the area of interest is limited due to streak artifact arising from dental hardware.  There is of vague suggestion of some bone loss around the outer table of the left mandible in the region of the left lower molars.  Periodontal disease is not excluded.  There is no evidence of bone loss or peri apical lucency or abscess in the maxillary region as visualized. No definable soft tissue swelling or contrast enhancement.  No discrete abscess is visualized.  Small retention cysts and mucous membrane thickening in the right maxillary antrum.  Opacification of some ethmoid air cells. Mastoid air cells are not opacified.  No acute air-fluid levels.  IMPRESSION: No definite evidence of periodontal abscess although visualization of the area of interest is limited due to streak artifact arising from dental hardware.  Suggestion of possible bone loss in the left mandible in the molar region.   Original Report Authenticated By: Burman Nieves, M.D.      Assessment/Plan: 66 yo male who presents with 1 week history of fever, chills, myalgias, now on empiric antibiotics but no source found. Prior to hospitalization he had been on oral antibiotics for presumed tooth abscess with amoxicillin x 2 1/2 wks.   presumed tooth abscess= continue with amp/sub, if he still remains afebrile today, recommend to switch to amox/clav 875 BID for addn 9 days  Will sign off. Call if further questions  Drue Second [facility] Cell: [phone] Pager: [phone]  09/21/2012, 12:33 PM

## 2012-09-21 NOTE — Progress Notes (Signed)
[facility] PROGRESS NOTE  [name] ZOX:[id number] DOB: September 06, 1946 DOA: 09/17/2012 PCP: Lupita Raider, MD  Assessment/Plan: 1. Fevers/presumed dental abscess -unclear source, blood cultures and urine cultures have been obtained. At this time UA and chest x-ray unremarkable. Orthopantomogram neg for abscess. Flu panel so far neg. Echo done 11/1 with no evidence of vegetationsSedimentation rate only mildly elevated. -appreciate ID input, tylenol dc'ed, and abx changed to unasyn. Closely follow cultures-so far neg. Called 11/2 and left message for the dentist Adria Devon that drained abscess initially-did not get call back from him. Also called Dr Lucky Cowboy- dental on call for [facility] today 11/3-left message as well and got no call back. -no further fevers overnight  - appreciate Dr Feliz Beam assistance, will change to po abx in am if afebrile overnight and plan to d/c then if continues to do well for f/u with his dentist outpt  #2. History of hypertension - patient was mildly hypotensive and  her antihypertensives were initially held. blood pressures now elevated, continue labetalol and norvasc, still holding acei secondary to #3.  #3. ARF on CKD  -cr 1.4 today, continuing to improve with hydration, follow and recheck . UA is unremarkable.  #4. Diabetes mellitus type 2 - continue holding metformin. Continue other out pt medications with monitoring of CBGs.  #5. Anemia secondary to chronic kidney disease - hgb stable  #6.OSA noncompliant with CPAP. #7.hypokalemia - resolved  Code Status: full Family Communication: family at bedside  Disposition Plan: to home when stable   Consultants:  ID  Procedures:  Echo  Study Conclusions  - Left ventricle: The cavity size was normal. Wall thickness was increased in a pattern of mild LVH. The estimated ejection fraction was 60%. Wall motion was normal; there were no regional wall motion abnormalities. - Left atrium: The atrium was mildly  dilated. - Impressions: Technically difficult study, but no obvious vegetations seen. Impressions:  - Technically difficult study, but no obvious vegetations seen.   Antibiotics:  vanc and cefepime started on 10/31>>11/2  UNASYN started 11/2  HPI/Subjective: States the area in his tooth/gum less swollen today, asking when he can go home.  Objective: Filed Vitals:   09/21/12 0516 09/21/12 1000 09/21/12 1400 09/21/12 1758  BP: 146/81 152/78 138/72 154/73  Pulse: 89 86 85 83  Temp: 99.3 F (37.4 C) 98 F (36.7 C) 98.1 F (36.7 C) 98.2 F (36.8 C)  TempSrc: Oral Oral Oral Oral  Resp: 20 20 20 20   Height:      Weight:      SpO2: 99% 98% 97% 98%    Intake/Output Summary (Last 24 hours) at 09/21/12 1922 Last data filed at 09/21/12 1700  Gross per 24 hour  Intake   1840 ml  Output   2600 ml  Net   -760 ml   Filed Weights   09/18/12 2029 09/19/12 2201 09/20/12 2122  Weight: 102.8 kg (226 lb 10.1 oz) 101.152 kg (223 lb) 101.8 kg (224 lb 6.9 oz)    Exam:   General:  A&O x3, in NAD  Cardiovascular: RRR  Respiratory: CTA bil  Abdomen: soft +BS, NT/ND  Data Reviewed: Basic Metabolic Panel:  Lab 09/21/12 8119 09/20/12 0520 09/18/12 0500 09/17/12 1510  NA 136 137 134* 134*  K 3.9 3.3* 3.7 3.8  CL 104 105 103 101  CO2 22 22 20 20   GLUCOSE 73 77 64* 97  BUN 12 15 26* 29*  CREATININE 1.40* 1.41* 1.89* 2.33*  CALCIUM 8.7 8.5 8.6 9.2  MG -- -- -- --  PHOS -- -- -- --   Liver Function Tests:  Lab 09/18/12 0500 09/17/12 1510  AST 37 32  ALT 20 20  ALKPHOS 45 52  BILITOT 0.4 0.4  PROT 6.9 7.9  ALBUMIN 3.6 4.1   No results found for this basename: LIPASE:5,AMYLASE:5 in the last 168 hours No results found for this basename: AMMONIA:5 in the last 168 hours CBC:  Lab 09/20/12 0520 09/18/12 0500 09/17/12 1510  WBC 4.7 9.3 8.9  NEUTROABS -- 4.2 5.1  HGB 9.6* 10.2* 11.7*  HCT 27.6* 29.7* 33.6*  MCV 83.1 83.7 83.0  PLT 186 210 233   Cardiac  Enzymes:  Lab 09/20/12 1650 09/18/12 0500 09/17/12 1608  CKTOTAL 284* 310* --  CKMB -- -- --  CKMBINDEX -- -- --  TROPONINI -- -- <0.30   BNP (last 3 results) No results found for this basename: PROBNP:3 in the last 8760 hours CBG:  Lab 09/21/12 1812 09/21/12 1643 09/21/12 1149 09/21/12 0803 09/20/12 2121  GLUCAP 82 69* 105* 80 90    Recent Results (from the past 240 hour(s))  CULTURE, BLOOD (ROUTINE X 2)     Status: Normal (Preliminary result)   Collection Time   09/17/12  4:00 PM      Component Value Range Status Comment   Specimen Description BLOOD ARM RIGHT   Final    Special Requests     Final    Value: BOTTLES DRAWN AEROBIC AND ANAEROBIC BLUE 10CC RED 5CC   Culture  Setup Time 09/18/2012 00:11   Final    Culture     Final    Value:        BLOOD CULTURE RECEIVED NO GROWTH TO DATE CULTURE WILL BE HELD FOR 5 DAYS BEFORE ISSUING A FINAL NEGATIVE REPORT   Report Status PENDING   Incomplete   CULTURE, BLOOD (ROUTINE X 2)     Status: Normal (Preliminary result)   Collection Time   09/17/12  4:20 PM      Component Value Range Status Comment   Specimen Description BLOOD HAND LEFT   Final    Special Requests BOTTLES DRAWN AEROBIC ONLY 5CC   Final    Culture  Setup Time 09/18/2012 00:11   Final    Culture     Final    Value:        BLOOD CULTURE RECEIVED NO GROWTH TO DATE CULTURE WILL BE HELD FOR 5 DAYS BEFORE ISSUING A FINAL NEGATIVE REPORT   Report Status PENDING   Incomplete   URINE CULTURE     Status: Normal   Collection Time   09/17/12  6:37 PM      Component Value Range Status Comment   Specimen Description URINE, RANDOM   Final    Special Requests NONE   Final    Culture  Setup Time 09/17/2012 20:10   Final    Colony Count NO GROWTH   Final    Culture NO GROWTH   Final    Report Status 09/18/2012 FINAL   Final   MRSA PCR SCREENING     Status: Normal   Collection Time   09/18/12  2:17 AM      Component Value Range Status Comment   MRSA by PCR NEGATIVE  NEGATIVE  Final      Studies: Dg Shoulder Right  09/20/2012  *RADIOLOGY REPORT*  Clinical Data: Shoulder pain  RIGHT SHOULDER - 2+ VIEW  Comparison: None.  Findings: Normal alignment without fracture.  AC joint aligned. Right lung  clear.  Visualized right ribs unremarkable.  IMPRESSION: No acute finding.   Original Report Authenticated By: Judie Petit. Miles Costain, M.D.    Ct Maxillofacial W/cm  09/20/2012  *RADIOLOGY REPORT*  Clinical Data: The patient had a root canal 3 weeks ago of the first left upper tooth.  Sub spleen developed an abscess which was glands by the dental surgeon.  The patient was placed on antibiotics.  The patient has then developed fever and chills with weakness for few days.  CT MAXILLOFACIAL WITH CONTRAST  Technique:  Multidetector CT imaging of the maxillofacial structures was performed with intravenous contrast. Multiplanar CT image reconstructions were also generated.  Contrast: 65mL OMNIPAQUE IOHEXOL 300 MG/ML  SOLN  Comparison: Orthopantogram 09/18/2012  Findings: Visualization of the area of interest is limited due to streak artifact arising from dental hardware.  There is of vague suggestion of some bone loss around the outer table of the left mandible in the region of the left lower molars.  Periodontal disease is not excluded.  There is no evidence of bone loss or peri apical lucency or abscess in the maxillary region as visualized. No definable soft tissue swelling or contrast enhancement.  No discrete abscess is visualized.  Small retention cysts and mucous membrane thickening in the right maxillary antrum.  Opacification of some ethmoid air cells. Mastoid air cells are not opacified.  No acute air-fluid levels.  IMPRESSION: No definite evidence of periodontal abscess although visualization of the area of interest is limited due to streak artifact arising from dental hardware.  Suggestion of possible bone loss in the left mandible in the molar region.   Original Report Authenticated By: Burman Nieves, M.D.     Scheduled Meds:    . amitriptyline  50 mg Oral QHS  . amLODipine  10 mg Oral Daily  . ampicillin-sulbactam (UNASYN) IV  3 g Intravenous Q6H  . colchicine  0.6 mg Oral Daily  . dextrose      . enoxaparin (LOVENOX) injection  50 mg Subcutaneous Q24H  . ezetimibe-simvastatin  1 tablet Oral QHS  . febuxostat  40 mg Oral Daily  . fenofibrate  160 mg Oral Daily  . glipiZIDE  10 mg Oral QAC breakfast  . insulin aspart  0-9 Units Subcutaneous TID WC  . insulin glargine  28 Units Subcutaneous Daily  . labetalol  300 mg Oral BID  . [COMPLETED] potassium chloride  30 mEq Oral Q4H  . sodium chloride  3 mL Intravenous Q12H  . [DISCONTINUED] insulin glargine  34 Units Subcutaneous Daily   Continuous Infusions:    . sodium chloride 1,000 mL (09/21/12 0807)    Principal Problem:  *Fever Active Problems:  DM (diabetes mellitus)  CKD (chronic kidney disease)  Anemia  HTN (hypertension)  Hyperlipidemia    Time spent:81mins    Kritika Stukes C  [facility] Pager [phone]. If 8PM-8AM, please contact night-coverage at [email or web address], password [facility] 09/21/2012, 7:22 PM  LOS: 4 days

## 2012-09-22 LAB — GLUCOSE, CAPILLARY
Glucose-Capillary: 64 mg/dL — ABNORMAL LOW (ref 70–99)
Glucose-Capillary: 131 mg/dL — ABNORMAL HIGH (ref 70–99)
Glucose-Capillary: 128 mg/dL — ABNORMAL HIGH (ref 70–99)

## 2012-09-22 MED ORDER — INSULIN GLARGINE 100 UNIT/ML ~~LOC~~ SOLN: 28.0000 [IU] | SUBCUTANEOUS | Status: DC

## 2012-09-22 MED ORDER — AMOXICILLIN-POT CLAVULANATE 875-125 MG PO TABS
1.0000 | ORAL_TABLET | Freq: Two times a day (BID) | ORAL | Status: DC
  Administered 2012-09-22: 1 via ORAL
  Filled 2012-09-22 (×4): qty 1

## 2012-09-22 MED ORDER — QUINAPRIL HCL 40 MG PO TABS: 20.0000 mg | ORAL_TABLET | Freq: Two times a day (BID) | ORAL | Status: DC

## 2012-09-22 MED ORDER — GLUCOSE-VITAMIN C 4-6 GM-MG PO CHEW
CHEWABLE_TABLET | ORAL | Status: AC
  Administered 2012-09-22: 3
  Filled 2012-09-22: qty 1

## 2012-09-22 MED ORDER — GLIPIZIDE ER 5 MG PO TB24: 5.0000 mg | ORAL_TABLET | Freq: Every day | ORAL | Status: DC

## 2012-09-22 MED ORDER — AMOXICILLIN-POT CLAVULANATE 875-125 MG PO TABS: 1.0000 | ORAL_TABLET | Freq: Two times a day (BID) | ORAL | Status: DC

## 2012-09-22 NOTE — ED Provider Notes (Signed)
Medical screening examination/treatment/procedure(s) were conducted as a shared visit with non-physician practitioner(s) and myself.  I personally evaluated the patient during the encounter  Tobin Chad, MD 09/22/12 [phone]

## 2012-09-22 NOTE — Discharge Summary (Signed)
Physician Discharge Summary  [name] ZOX:[id number] DOB: December 25, 1945 DOA: 09/17/2012  PCP: Lupita Raider, MD  Admit date: 09/17/2012 Discharge date: 09/22/2012  Time spent: >90minutes  Recommendations for Outpatient Follow-up:      Follow-up Information    Follow up with SHAW,KIMBERLEE, MD. (in 1-2weeks, call for appt upon discharge.)    Contact information:   [address] [place] [address] [phone]       Follow up with [facility], DDS. (this week, call for appt upon discharge)    Contact information:   INDIVIDUAL [address] [place] [address] [phone]           Discharge Diagnoses:  Principal Problem:  *Fever Active Problems:  DM (diabetes mellitus)  CKD (chronic kidney disease)  Anemia  HTN (hypertension)  Hyperlipidemia   Discharge Condition: improved/stable  Diet recommendation: modified carbs  Filed Weights   09/19/12 2201 09/20/12 2122 09/21/12 2020  Weight: 101.152 kg (223 lb) 101.8 kg (224 lb 6.9 oz) 102 kg (224 lb 13.9 oz)    History of present illness:    Hospital Course:  1. Fevers/presumed dental abscess -As discussed above, UA and chest x-ray unremarkable in the ED, and bloodand urine cultures have been were obtained upon admission patient was started on empiric broad-spectrum antibiotics.. Orthopantomogram neg for abscess. Flu panel so far neg. Echo done 11/1 with no evidence of vegetationsSedimentation rate only mildly elevated.  -Infectious disease was consulted and Dr. Ilsa Iha saw the patient and recommended tylenol dc'ed, and abx changed to unasyn. Attempts were made to get an inpatient dental consult with unsuccessful- Called 11/2 and left message for the dentist Adria Devon that drained abscess initially-did not get call back from him. Also called Dr Lucky Cowboy- dental on call for [facility] today 11/3-left message as well and got no call back. Patient was remaining afebrile even off Tylenol and his  cultures have been negative to date, and so Dr. Drue Second on followup recommended that the patient remained afebrile overnight, since his antibiotics were to be changed to oral a total of 9 days.. -He made afebrile and hemodynamically stable overnight and is clinically much improved. His antibiotics have been changed oral and he'll be discharged today for outpatient follow up. #2. History of hypertension - patient was mildly hypotensive and her antihypertensives were initially held. But subsequently he is blood pressures became elevated, and so his labetalol and norvasc were resumed. His acei secondary to #3, and this has improved with hydration and patient has been instructed to resume his quinapril at a lower dose and follow up with his primary care physician for further monitoring of his renal function and blood pressures and adjustment of his meds as clinically appropriate.  #3. ARF on CKD  -His creatinine on admission was 2.33, and was noted that he has a history of chronic kidney disease. He was hydrated with IV fluids and his ACE inhibitor his held as discussed above, and his renal function improved as ready discussed.  #4. Diabetes mellitus type 2 - continue holding metformin. Continue other out pt medications with monitoring of CBGs.  #5. Anemia secondary to chronic kidney disease - hgb stable  #6.OSA noncompliant with CPAP.  #7.hypokalemia - resolved, his potassium was replaced in the hospital.  Consultants:  ID Procedures:  Echo  Study Conclusions  - Left ventricle: The cavity size was normal. Wall thickness was increased in a pattern of mild LVH. The estimated ejection fraction was 60%. Wall motion was normal; there  were no regional wall motion abnormalities. - Left atrium: The atrium was mildly dilated. - Impressions: Technically difficult study, but no obvious vegetations seen. Impressions:  - Technically difficult study, but no obvious vegetations seen.     Discharge  Exam: Filed Vitals:   09/21/12 1758 09/21/12 2020 09/22/12 0445 09/22/12 1000  BP: 154/73 162/76 166/79 162/86  Pulse: 83 86 89 80  Temp: 98.2 F (36.8 C) 99.8 F (37.7 C) 99.2 F (37.3 C) 98.4 F (36.9 C)  TempSrc: Oral Oral Oral Oral  Resp: 20 20 18 18   Height:      Weight:  102 kg (224 lb 13.9 oz)    SpO2: 98% 98% 98% 98%    Exam:  General: A&O x3, in NAD  Cardiovascular: RRR  Respiratory: CTA bil  Abdomen: soft +BS, NT/ND   Discharge Instructions  Discharge Orders    Future Appointments: Provider: Department: Dept Phone: Center:   11/21/2012 11:00 AM Dava Najjar [facility] [phone] None   02/20/2013 11:00 AM Dava Najjar [facility] [phone] None   05/21/2013 11:00 AM Radene Gunning [place] [facility] [phone] None   08/21/2013 10:30 AM Krista Blue [facility] MED[facility] ONCOL[facility] [phone] None   08/21/2013 11:00 AM Samul Dada, MD [facility] MEDICAL ONCOLOGY [phone] None     Future Orders Please Complete By Expires   Diet Carb Modified      Increase activity slowly          Medication List     As of 09/22/2012  1:04 PM    STOP taking these medications         vitamin E 100 UNIT capsule      TAKE these medications         amitriptyline 50 MG tablet   Commonly known as: ELAVIL   Take 50 mg by mouth at bedtime.      amLODipine 10 MG tablet   Commonly known as: NORVASC   Take 10 mg by mouth daily.      amoxicillin-clavulanate 875-125 MG per tablet   Commonly known as: AUGMENTIN   Take 1 tablet by mouth every 12 (twelve) hours.      BYDUREON 2 MG Susr   Generic drug: Exenatide   Inject 2 mg into the skin once a week. Patient takes on Wednesdays      colchicine 0.6 MG tablet   Take 0.6 mg by mouth daily.      ezetimibe-simvastatin 10-40 MG per tablet   Commonly known as: VYTORIN   Take 1 tablet by mouth  at bedtime.      glipiZIDE 5 MG 24 hr tablet   Commonly known as: GLUCOTROL XL   Take 1 tablet (5 mg total) by mouth daily.      insulin glargine 100 UNIT/ML injection   Commonly known as: LANTUS   Inject 28 Units into the skin every morning.      labetalol 300 MG tablet   Commonly known as: NORMODYNE   Take 300-450 mg by mouth 2 (two) times daily. 1.5 tabs in the am, 1 tab in the pm      ONE TOUCH ULTRA TEST test strip   Generic drug: glucose blood      quinapril 40 MG tablet   Commonly known as: ACCUPRIL   Take 0.5 tablets (20 mg total) by mouth 2 (two) times daily.  TRILIPIX 135 MG capsule   Generic drug: Choline Fenofibrate   Take 135 mg by mouth at bedtime.      ULORIC 40 MG tablet   Generic drug: febuxostat   Take 40 mg by mouth.           Follow-up Information    Follow up with SHAW,KIMBERLEE, MD. (in 1-2weeks, call for appt upon discharge.)    Contact information:   [address] [place] [address] [phone]       Follow up with Alyson Ingles, DDS. (this week, call for appt upon discharge)    Contact information:   IND[address] [place] [address] [phone]           The results of significant diagnostics from this hospitalization (including imaging, microbiology, ancillary and laboratory) are listed below for reference.    Significant Diagnostic Studies: Ct Abdomen Pelvis Wo Contrast  09/17/2012  *RADIOLOGY REPORT*  Clinical Data: Body aches and fever.  Decreased appetite.  CT ABDOMEN AND PELVIS WITHOUT CONTRAST  Technique:  Multidetector CT imaging of the abdomen and pelvis was performed following the standard protocol without intravenous contrast.  Comparison: CT abdomen and pelvis 09/06/2004.  Findings: Lung bases are clear.  No pleural or pericardial effusion.  The liver is low attenuating consistent with fatty infiltration. Single punctate calcification near the dome of the liver is  identified.  A small stone is identified within the gallbladder and there is some increased attenuation material present compatible with sludge.  No pericholecystic fluid or stranding is identified. The kidneys appear normal bilaterally.  No renal or ureteral stones and no hydronephrosis.  The adrenal glands, spleen and pancreas are unremarkable.  The stomach and small and large bowel appear normal.  The appendix is not visualized and may have been removed.  There is no lymphadenopathy or fluid.  No focal bony abnormality is identified. The patient has a right total hip replacement.  IMPRESSION:  1.  No acute finding. 2.  Single small gallstone and likely gallbladder sludge.  No CT evidence of cholecystitis. 3.  Status post right hip replacement. 4.  Fatty infiltration of the liver.   Original Report Authenticated By: Bernadene Bell. Maricela Curet, M.D.    Dg Orthopantogram  09/18/2012  *RADIOLOGY REPORT*  Clinical Data: Fever.  Swelling in the roof of the mouth.  ORTHOPANTOGRAM/PANORAMIC  Comparison: None.  Findings: There is no evidence of periapical abscess or other acute abnormality. Multiple root canals are noted.  Multiple metallic restorations.  Multiple missing teeth.   IMPRESSION: No acute abnormalities.  Specifically, no evidence of periapical abscess.   Original Report Authenticated By: Francene Boyers, M.D.    Dg Chest 2 View  09/18/2012  *RADIOLOGY REPORT*  Clinical Data: Fever, chills and weakness.  CHEST - 2 VIEW  Comparison: PA and lateral chest 09/16/2012 and 08/18/2004.  Findings: Lungs are clear.  Heart size is normal.  No pneumothorax or pleural effusion.  IMPRESSION: No acute disease.   Original Report Authenticated By: Bernadene Bell. Maricela Curet, M.D.    Dg Chest 2 View  09/16/2012  *RADIOLOGY REPORT*  Clinical Data: Fever and shortness of breath.  CHEST - 2 VIEW  Comparison: Chest x-ray 08/18/2004.  Findings: The heart is upper limits of normal and stable.  The mediastinal and hilar contours are  unchanged.  Low lung volumes with vascular crowding and bibasilar atelectasis.  No definite infiltrates, effusions or edema.  Stable eventration of the right hemidiaphragm.  The bony thorax  is intact.  IMPRESSION: Low lung volumes with vascular crowding and streaky bibasilar atelectasis.   Original Report Authenticated By: P. Loralie Champagne, M.D.    Dg Shoulder Right  09/20/2012  *RADIOLOGY REPORT*  Clinical Data: Shoulder pain  RIGHT SHOULDER - 2+ VIEW  Comparison: None.  Findings: Normal alignment without fracture.  AC joint aligned. Right lung clear.  Visualized right ribs unremarkable.  IMPRESSION: No acute finding.   Original Report Authenticated By: Judie Petit. Miles Costain, M.D.    Ct Maxillofacial W/cm  09/20/2012  *RADIOLOGY REPORT*  Clinical Data: The patient had a root canal 3 weeks ago of the first left upper tooth.  Sub spleen developed an abscess which was glands by the dental surgeon.  The patient was placed on antibiotics.  The patient has then developed fever and chills with weakness for few days.  CT MAXILLOFACIAL WITH CONTRAST  Technique:  Multidetector CT imaging of the maxillofacial structures was performed with intravenous contrast. Multiplanar CT image reconstructions were also generated.  Contrast: 65mL OMNIPAQUE IOHEXOL 300 MG/ML  SOLN  Comparison: Orthopantogram 09/18/2012  Findings: Visualization of the area of interest is limited due to streak artifact arising from dental hardware.  There is of vague suggestion of some bone loss around the outer table of the left mandible in the region of the left lower molars.  Periodontal disease is not excluded.  There is no evidence of bone loss or peri apical lucency or abscess in the maxillary region as visualized. No definable soft tissue swelling or contrast enhancement.  No discrete abscess is visualized.  Small retention cysts and mucous membrane thickening in the right maxillary antrum.  Opacification of some ethmoid air cells. Mastoid air cells are not  opacified.  No acute air-fluid levels.  IMPRESSION: No definite evidence of periodontal abscess although visualization of the area of interest is limited due to streak artifact arising from dental hardware.  Suggestion of possible bone loss in the left mandible in the molar region.   Original Report Authenticated By: Burman Nieves, M.D.     Microbiology: Recent Results (from the past 240 hour(s))  CULTURE, BLOOD (ROUTINE X 2)     Status: Normal (Preliminary result)   Collection Time   09/17/12  4:00 PM      Component Value Range Status Comment   Specimen Description BLOOD ARM RIGHT   Final    Special Requests     Final    Value: BOTTLES DRAWN AEROBIC AND ANAEROBIC BLUE 10CC RED 5CC   Culture  Setup Time 09/18/2012 00:11   Final    Culture     Final    Value:        BLOOD CULTURE RECEIVED NO GROWTH TO DATE CULTURE WILL BE HELD FOR 5 DAYS BEFORE ISSUING A FINAL NEGATIVE REPORT   Report Status PENDING   Incomplete   CULTURE, BLOOD (ROUTINE X 2)     Status: Normal (Preliminary result)   Collection Time   09/17/12  4:20 PM      Component Value Range Status Comment   Specimen Description BLOOD HAND LEFT   Final    Special Requests BOTTLES DRAWN AEROBIC ONLY 5CC   Final    Culture  Setup Time 09/18/2012 00:11   Final    Culture     Final    Value:        BLOOD CULTURE RECEIVED NO GROWTH TO DATE CULTURE WILL BE HELD FOR 5 DAYS BEFORE ISSUING A FINAL NEGATIVE REPORT   Report Status  PENDING   Incomplete   URINE CULTURE     Status: Normal   Collection Time   09/17/12  6:37 PM      Component Value Range Status Comment   Specimen Description URINE, RANDOM   Final    Special Requests NONE   Final    Culture  Setup Time 09/17/2012 20:10   Final    Colony Count NO GROWTH   Final    Culture NO GROWTH   Final    Report Status 09/18/2012 FINAL   Final   MRSA PCR SCREENING     Status: Normal   Collection Time   09/18/12  2:17 AM      Component Value Range Status Comment   MRSA by PCR NEGATIVE   NEGATIVE Final      Labs: Basic Metabolic Panel:  Lab 09/21/12 1610 09/20/12 0520 09/18/12 0500 09/17/12 1510  NA 136 137 134* 134*  K 3.9 3.3* 3.7 3.8  CL 104 105 103 101  CO2 22 22 20 20   GLUCOSE 73 77 64* 97  BUN 12 15 26* 29*  CREATININE 1.40* 1.41* 1.89* 2.33*  CALCIUM 8.7 8.5 8.6 9.2  MG -- -- -- --  PHOS -- -- -- --   Liver Function Tests:  Lab 09/18/12 0500 09/17/12 1510  AST 37 32  ALT 20 20  ALKPHOS 45 52  BILITOT 0.4 0.4  PROT 6.9 7.9  ALBUMIN 3.6 4.1   No results found for this basename: LIPASE:5,AMYLASE:5 in the last 168 hours No results found for this basename: AMMONIA:5 in the last 168 hours CBC:  Lab 09/20/12 0520 09/18/12 0500 09/17/12 1510  WBC 4.7 9.3 8.9  NEUTROABS -- 4.2 5.1  HGB 9.6* 10.2* 11.7*  HCT 27.6* 29.7* 33.6*  MCV 83.1 83.7 83.0  PLT 186 210 233   Cardiac Enzymes:  Lab 09/20/12 1650 09/18/12 0500 09/17/12 1608  CKTOTAL 284* 310* --  CKMB -- -- --  CKMBINDEX -- -- --  TROPONINI -- -- <0.30   BNP: BNP (last 3 results) No results found for this basename: PROBNP:3 in the last 8760 hours CBG:  Lab 09/22/12 1126 09/22/12 1025 09/22/12 0815 09/22/12 0732 09/21/12 2024  GLUCAP 128* 131* 100* 64* 90       Signed:  Leean Amezcua C  [facility] 09/22/2012, 1:04 PM

## 2012-09-22 NOTE — Progress Notes (Signed)
Patient discharged home with wife with discharge instructions and prescriptions. Patient was told to call doctor with questions or concerns and to go to the emergency room in case of an emergency. Patient was stable upon discharge.

## 2012-09-22 NOTE — Significant Event (Signed)
CBG:64  Treatment: 3 glucose tabs  Symptoms: None  Follow-up CBG: Time:815 CBG Result:100  Possible Reasons for Event: Unknown  Comments/MD notified:Viyuoh

## 2012-09-24 LAB — CULTURE, BLOOD (ROUTINE X 2): Culture: NO GROWTH

## 2012-11-19 HISTORY — PX: MANDIBLE RECONSTRUCTION: SHX431

## 2012-11-21 ENCOUNTER — Other Ambulatory Visit (HOSPITAL_BASED_OUTPATIENT_CLINIC_OR_DEPARTMENT_OTHER): Payer: Medicare Other | Admitting: Lab

## 2012-11-21 DIAGNOSIS — N189 Chronic kidney disease, unspecified: Secondary | ICD-10-CM

## 2012-11-21 DIAGNOSIS — D631 Anemia in chronic kidney disease: Secondary | ICD-10-CM

## 2012-11-21 LAB — CBC WITH DIFFERENTIAL/PLATELET
BASO%: 0.5 % (ref 0.0–2.0)
HCT: 31.9 % — ABNORMAL LOW (ref 38.4–49.9)
LYMPH%: 29.8 % (ref 14.0–49.0)
MCH: 29.6 pg (ref 27.2–33.4)
MCHC: 34.7 g/dL (ref 32.0–36.0)
MONO#: 0.5 10*3/uL (ref 0.1–0.9)
NEUT%: 52.8 % (ref 39.0–75.0)
Platelets: 238 10*3/uL (ref 140–400)
WBC: 7.4 10*3/uL (ref 4.0–10.3)

## 2013-02-20 ENCOUNTER — Other Ambulatory Visit (HOSPITAL_BASED_OUTPATIENT_CLINIC_OR_DEPARTMENT_OTHER): Payer: Medicare Other | Admitting: Lab

## 2013-02-20 ENCOUNTER — Ambulatory Visit (HOSPITAL_BASED_OUTPATIENT_CLINIC_OR_DEPARTMENT_OTHER): Payer: Medicare Other

## 2013-02-20 VITALS — BP 115/61 | HR 82 | Temp 98.3°F

## 2013-02-20 DIAGNOSIS — N039 Chronic nephritic syndrome with unspecified morphologic changes: Secondary | ICD-10-CM

## 2013-02-20 DIAGNOSIS — N189 Chronic kidney disease, unspecified: Secondary | ICD-10-CM

## 2013-02-20 DIAGNOSIS — D631 Anemia in chronic kidney disease: Secondary | ICD-10-CM

## 2013-02-20 LAB — CBC WITH DIFFERENTIAL/PLATELET
BASO%: 0.3 % (ref 0.0–2.0)
EOS%: 8.3 % — ABNORMAL HIGH (ref 0.0–7.0)
HCT: 31.7 % — ABNORMAL LOW (ref 38.4–49.9)
LYMPH%: 43.2 % (ref 14.0–49.0)
MCH: 28.4 pg (ref 27.2–33.4)
MCHC: 34.3 g/dL (ref 32.0–36.0)
MCV: 82.9 fL (ref 79.3–98.0)
MONO%: 6.5 % (ref 0.0–14.0)
NEUT%: 41.7 % (ref 39.0–75.0)
Platelets: 190 10*3/uL (ref 140–400)
lymph#: 3.2 10*3/uL (ref 0.9–3.3)

## 2013-02-20 MED ORDER — DARBEPOETIN ALFA-POLYSORBATE 500 MCG/ML IJ SOLN
300.0000 ug | Freq: Once | INTRAMUSCULAR | Status: AC
  Administered 2013-02-20: 300 ug via SUBCUTANEOUS
  Filled 2013-02-20: qty 1

## 2013-02-20 NOTE — Patient Instructions (Addendum)
Darbepoetin Alfa injection What is this medicine? DARBEPOETIN ALFA (dar be POE e tin AL fa) helps your body make more red blood cells. It is used to treat anemia caused by chronic kidney failure and chemotherapy. This medicine may be used for other purposes; ask your health care provider or pharmacist if you have questions. What should I tell my health care provider before I take this medicine? They need to know if you have any of these conditions: -blood clotting disorders or history of blood clots -cancer patient not on chemotherapy -cystic fibrosis -heart disease, such as angina, heart failure, or a history of a heart attack -hemoglobin level of 12 g/dL or greater -high blood pressure -low levels of folate, iron, or vitamin B12 -seizures -an unusual or allergic reaction to darbepoetin, erythropoietin, albumin, hamster proteins, latex, other medicines, foods, dyes, or preservatives -pregnant or trying to get pregnant -breast-feeding How should I use this medicine? This medicine is for injection into a vein or under the skin. It is usually given by a health care professional in a hospital or clinic setting. If you get this medicine at home, you will be taught how to prepare and give this medicine. Do not shake the solution before you withdraw a dose. Use exactly as directed. Take your medicine at regular intervals. Do not take your medicine more often than directed. It is important that you put your used needles and syringes in a special sharps container. Do not put them in a trash can. If you do not have a sharps container, call your pharmacist or healthcare provider to get one. Talk to your pediatrician regarding the use of this medicine in children. While this medicine may be used in children as young as 1 year for selected conditions, precautions do apply. Overdosage: If you think you have taken too much of this medicine contact a poison control center or emergency room at once. NOTE:  This medicine is only for you. Do not share this medicine with others. What if I miss a dose? If you miss a dose, take it as soon as you can. If it is almost time for your next dose, take only that dose. Do not take double or extra doses. What may interact with this medicine? Do not take this medicine with any of the following medications: -epoetin alfa This list may not describe all possible interactions. Give your health care provider a list of all the medicines, herbs, non-prescription drugs, or dietary supplements you use. Also tell them if you smoke, drink alcohol, or use illegal drugs. Some items may interact with your medicine. What should I watch for while using this medicine? Visit your prescriber or health care professional for regular checks on your progress and for the needed blood tests and blood pressure measurements. It is especially important for the doctor to make sure your hemoglobin level is in the desired range, to limit the risk of potential side effects and to give you the best benefit. Keep all appointments for any recommended tests. Check your blood pressure as directed. Ask your doctor what your blood pressure should be and when you should contact him or her. As your body makes more red blood cells, you may need to take iron, folic acid, or vitamin B supplements. Ask your doctor or health care provider which products are right for you. If you have kidney disease continue dietary restrictions, even though this medication can make you feel better. Talk with your doctor or health care professional about the   foods you eat and the vitamins that you take. What side effects may I notice from receiving this medicine? Side effects that you should report to your doctor or health care professional as soon as possible: -allergic reactions like skin rash, itching or hives, swelling of the face, lips, or tongue -breathing problems -changes in vision -chest pain -confusion, trouble speaking  or understanding -feeling faint or lightheaded, falls -high blood pressure -muscle aches or pains -pain, swelling, warmth in the leg -rapid weight gain -severe headaches -sudden numbness or weakness of the face, arm or leg -trouble walking, dizziness, loss of balance or coordination -seizures (convulsions) -swelling of the ankles, feet, hands -unusually weak or tired Side effects that usually do not require medical attention (report to your doctor or health care professional if they continue or are bothersome): -diarrhea -fever, chills (flu-like symptoms) -headaches -nausea, vomiting -redness, stinging, or swelling at site where injected This list may not describe all possible side effects. Call your doctor for medical advice about side effects. You may report side effects to FDA at 1-800-FDA-1088. Where should I keep my medicine? Keep out of the reach of children. Store in a refrigerator between 2 and 8 degrees C (36 and 46 degrees F). Do not freeze. Do not shake. Throw away any unused portion if using a single-dose vial. Throw away any unused medicine after the expiration date. NOTE: This sheet is a summary. It may not cover all possible information. If you have questions about this medicine, talk to your doctor, pharmacist, or health care provider.  2013, [employer]. (10/19/2008 10:23:57 AM)  

## 2013-04-23 ENCOUNTER — Other Ambulatory Visit: Payer: Self-pay | Admitting: Dermatology

## 2013-05-21 ENCOUNTER — Other Ambulatory Visit: Payer: Self-pay | Admitting: Medical Oncology

## 2013-05-21 ENCOUNTER — Ambulatory Visit (HOSPITAL_BASED_OUTPATIENT_CLINIC_OR_DEPARTMENT_OTHER): Payer: Medicare Other

## 2013-05-21 ENCOUNTER — Other Ambulatory Visit (HOSPITAL_BASED_OUTPATIENT_CLINIC_OR_DEPARTMENT_OTHER): Payer: Medicare Other | Admitting: Lab

## 2013-05-21 VITALS — BP 101/54 | HR 81 | Temp 98.6°F

## 2013-05-21 DIAGNOSIS — D631 Anemia in chronic kidney disease: Secondary | ICD-10-CM

## 2013-05-21 DIAGNOSIS — N189 Chronic kidney disease, unspecified: Secondary | ICD-10-CM

## 2013-05-21 DIAGNOSIS — N039 Chronic nephritic syndrome with unspecified morphologic changes: Secondary | ICD-10-CM

## 2013-05-21 LAB — CBC WITH DIFFERENTIAL/PLATELET
BASO%: 0.4 % (ref 0.0–2.0)
EOS%: 7.1 % — ABNORMAL HIGH (ref 0.0–7.0)
MCH: 29 pg (ref 27.2–33.4)
MCHC: 35 g/dL (ref 32.0–36.0)
MONO%: 7.7 % (ref 0.0–14.0)
RBC: 3.56 10*6/uL — ABNORMAL LOW (ref 4.20–5.82)
RDW: 15.1 % — ABNORMAL HIGH (ref 11.0–14.6)
lymph#: 2.8 10*3/uL (ref 0.9–3.3)

## 2013-05-21 MED ORDER — DARBEPOETIN ALFA-POLYSORBATE 300 MCG/0.6ML IJ SOLN
300.0000 ug | Freq: Once | INTRAMUSCULAR | Status: AC
  Administered 2013-05-21: 300 ug via SUBCUTANEOUS
  Filled 2013-05-21: qty 0.6

## 2013-08-20 ENCOUNTER — Other Ambulatory Visit: Payer: Self-pay | Admitting: Medical Oncology

## 2013-08-20 DIAGNOSIS — N189 Chronic kidney disease, unspecified: Secondary | ICD-10-CM

## 2013-08-21 ENCOUNTER — Other Ambulatory Visit (HOSPITAL_BASED_OUTPATIENT_CLINIC_OR_DEPARTMENT_OTHER): Payer: Medicare Other | Admitting: Lab

## 2013-08-21 ENCOUNTER — Ambulatory Visit (HOSPITAL_BASED_OUTPATIENT_CLINIC_OR_DEPARTMENT_OTHER): Payer: Medicare Other | Admitting: Internal Medicine

## 2013-08-21 ENCOUNTER — Other Ambulatory Visit: Payer: Self-pay | Admitting: Internal Medicine

## 2013-08-21 ENCOUNTER — Ambulatory Visit: Payer: Medicare Other

## 2013-08-21 ENCOUNTER — Telehealth: Payer: Self-pay | Admitting: Internal Medicine

## 2013-08-21 ENCOUNTER — Encounter: Payer: Self-pay | Admitting: Internal Medicine

## 2013-08-21 VITALS — BP 130/76 | HR 83 | Temp 99.0°F | Resp 18 | Ht 70.0 in | Wt 229.1 lb

## 2013-08-21 DIAGNOSIS — D631 Anemia in chronic kidney disease: Secondary | ICD-10-CM

## 2013-08-21 DIAGNOSIS — N189 Chronic kidney disease, unspecified: Secondary | ICD-10-CM

## 2013-08-21 LAB — COMPREHENSIVE METABOLIC PANEL (CC13)
AST: 22 U/L (ref 5–34)
BUN: 20 mg/dL (ref 7.0–26.0)
CO2: 21 mEq/L — ABNORMAL LOW (ref 22–29)
Calcium: 9.1 mg/dL (ref 8.4–10.4)
Chloride: 116 mEq/L — ABNORMAL HIGH (ref 98–109)
Creatinine: 1.8 mg/dL — ABNORMAL HIGH (ref 0.7–1.3)
Glucose: 72 mg/dl (ref 70–140)
Potassium: 3.6 mEq/L (ref 3.5–5.1)
Total Bilirubin: 0.22 mg/dL (ref 0.20–1.20)

## 2013-08-21 LAB — CBC WITH DIFFERENTIAL/PLATELET
Basophils Absolute: 0 10*3/uL (ref 0.0–0.1)
EOS%: 8.1 % — ABNORMAL HIGH (ref 0.0–7.0)
Eosinophils Absolute: 0.7 10*3/uL — ABNORMAL HIGH (ref 0.0–0.5)
HCT: 30.9 % — ABNORMAL LOW (ref 38.4–49.9)
HGB: 10.7 g/dL — ABNORMAL LOW (ref 13.0–17.1)
LYMPH%: 45.7 % (ref 14.0–49.0)
MCH: 29.2 pg (ref 27.2–33.4)
MONO#: 0.6 10*3/uL (ref 0.1–0.9)
NEUT#: 3.3 10*3/uL (ref 1.5–6.5)
NEUT%: 38.4 % — ABNORMAL LOW (ref 39.0–75.0)
Platelets: 246 10*3/uL (ref 140–400)
RDW: 13.4 % (ref 11.0–14.6)
lymph#: 3.9 10*3/uL — ABNORMAL HIGH (ref 0.9–3.3)

## 2013-08-21 LAB — LACTATE DEHYDROGENASE (CC13): LDH: 150 U/L (ref 125–245)

## 2013-08-21 MED ORDER — DARBEPOETIN ALFA-POLYSORBATE 300 MCG/0.6ML IJ SOLN
300.0000 ug | Freq: Once | INTRAMUSCULAR | Status: AC
  Administered 2013-08-21: 300 ug via SUBCUTANEOUS
  Filled 2013-08-21: qty 0.6

## 2013-08-21 NOTE — Patient Instructions (Addendum)
Kidney Disease, Adult  The kidneys are two organs that lie on either side of the spine between the middle of the back and the front of the abdomen. The kidneys:    Remove wastes and extra water from the blood.    Produce important hormones. These regulate blood pressure, help keep bones strong, and help create red blood cells.    Balance the fluids and chemicals in the blood and tissues.  Kidney disease occurs when the kidneys are damaged. Kidney damage may be sudden (acute) or develop over a long period (chronic). A small amount of damage may not cause problems, but a large amount of damage may make it difficult or impossible for the kidneys to work the way they should. Early detection and treatment of kidney disease may prevent kidney damage from becoming permanent or getting worse. Some kidney diseases are curable, but most are not. Many people with kidney disease are able to control the disease and live a normal life.   TYPES OF KIDNEY DISEASE   Acute kidney injury.Acute kidney injury occurs when there is sudden damage to the kidneys.   Chronic kidney disease. Chronic kidney disease occurs when the kidneys are damaged over a long period.   End-stage kidney disease. End-stage kidney disease occurs when the kidneys are so damaged that they stop working. In end-stage kidney disease, the kidneys cannot get better.  CAUSES  Any condition, disease, or event that damages the kidneys may cause kidney disease.  Acute kidney injury.   A problem with blood flow to the kidneys. This may be caused by:    Blood loss.    Heart disease.    Severe burns.    Liver disease.   Direct damage to the kidneys. This may be caused by:   Some medicines.    A kidney infection.    Poisoning or consuming toxic substances.    A surgical wound.    A blow to the kidney area.    A problem with urine flow. This may be caused by:    Cancer.    Kidney stones.    An enlarged prostate.  Chronic kidney disease. The  most common causes of chronic kidney disease are diabetes and high blood pressure (hypertension). Chronic kidney disease may also be caused by:    Diseases that cause the filtering units of the kidneys to become inflamed.    Diseases that affect the immune system.    Genetic diseases.    Medicines that damage the kidneys, such as anti-inflammatory medicines.   Poisoning or exposure to toxic substances.    A reoccurring kidney or urinary infection.    A problem with urine flow. This may be caused by:   Cancer.    Kidney stones.    An enlarged prostate in males.  End-stage kidney disease. This kidney disease usually occurs when a chronic kidney disease gets worse. It may also occur after acute kidney injury.   SYMPTOMS    Swelling (edema) of the legs, ankles, or feet.    Tiredness (lethargy).    Nausea or vomiting.    Confusion.    Problems with urination, such as:    Painful or burning feeling during urination.    Decreased urine production.   Bloody urine.    Frequent urination, especially at night.   Hypertension.   Muscle twitches and cramps.    Shortness of breath.    Persistent itchiness.    Loss of appetite.   Metallic taste in the   disease may be detected and diagnosed by tests, including blood, urine, imaging, or kidney biopsy tests.  TREATMENT  Acute kidney injury. Treatment of acute kidney injury varies depending on the cause and severity of the kidney damage. In mild cases, no treatment may be needed. The kidneys may heal on their own. If acute kidney injury is more severe, your caregiver will treat the cause of the kidney damage, help the kidneys heal, and  prevent complications from occurring. Severe cases may require a procedure to remove toxic wastes from the body (dialysis) or surgery to repair kidney damage. Surgery may involve:   Repair of a torn kidney.   Removal of an obstruction.  Most of the time, you will need to stay overnight at the hospital.  Chronic kidney disease. Most chronic kidney diseases cannot be cured. Treatment usually involves relieving symptoms and preventing or slowing the progression of the disease. Treatment may include:   A special diet. You may need to avoid alcohol and foods that:   Have added salt.   Are high in potassium.   Are high in protein.   Medicines. These may:   Lower blood pressure.   Relieve anemia.   Relieve swelling.   Protect the bones.  End-stage kidney disease. End-stage kidney disease is life-threatening and must be treated immediately. There are two treatments for end-stage kidney disease:   Dialysis.   Receiving a new kidney (kidney transplant). Both of these treatments have serious risks and consequences. In addition to having dialysis or a kidney transplant, you may need to take medicines to control hypertension and cholesterol and to decrease phosphorus levels in your blood. LENGTH OF ILLNESS  Acute kidney injury.The length of this disease varies greatly from person to person. Exactly how long it lasts depends on the cause of the kidney damage. Acute kidney injury may develop into chronic kidney disease or end-stage kidney disease.  Chronic kidney disease. This disease usually lasts a lifetime. Chronic kidney disease may worsen over time to become end-stage kidney disease. The time it takes for end-stage kidney disease to develop varies from person to person.  End-stage kidney disease. This disease lasts until a kidney transplant is performed. PREVENTION  Kidney disease can sometimes be prevented. If you have diabetes, hypertension, or any other condition that may  lead to kidney disease, you should try to prevent kidney disease with:   An appropriate diet.  Medicine.  Lifestyle changes. FOR MORE INFORMATION  American Association of Kidney Patients: ResidentialShow.is  National Kidney Foundation: www.kidney.org  American Kidney Fund: FightingMatch.com.ee  [employer] [facility]: www.lifeoptions.org and www.kidneyschool.org  Document Released: 11/05/2005 Document Revised: 10/22/2012 Document Reviewed: 07/04/2012 [facility] Patient Information 2014 [place], [place]. Darbepoetin Alfa injection What is this medicine? DARBEPOETIN ALFA (dar be POE e tin AL fa) helps your body make more red blood cells. It is used to treat anemia caused by chronic kidney failure and chemotherapy. This medicine may be used for other purposes; ask your health care provider or pharmacist if you have questions. What should I tell my health care provider before I take this medicine? They need to know if you have any of these conditions: -blood clotting disorders or history of blood clots -cancer patient not on chemotherapy -cystic fibrosis -heart disease, such as angina, heart failure, or a history of a heart attack -hemoglobin level of 12 g/dL or greater -high blood pressure -low levels of folate, iron, or vitamin B12 -seizures -an unusual or allergic reaction to darbepoetin, erythropoietin, albumin, hamster proteins,  latex, other medicines, foods, dyes, or preservatives -pregnant or trying to get pregnant -breast-feeding How should I use this medicine? This medicine is for injection into a vein or under the skin. It is usually given by a health care professional in a hospital or clinic setting. If you get this medicine at home, you will be taught how to prepare and give this medicine. Do not shake the solution before you withdraw a dose. Use exactly as directed. Take your medicine at regular intervals. Do not take your medicine more often than directed. It is important  that you put your used needles and syringes in a special sharps container. Do not put them in a trash can. If you do not have a sharps container, call your pharmacist or healthcare provider to get one. Talk to your pediatrician regarding the use of this medicine in children. While this medicine may be used in children as young as 1 year for selected conditions, precautions do apply. Overdosage: If you think you have taken too much of this medicine contact a poison control center or emergency room at once. NOTE: This medicine is only for you. Do not share this medicine with others. What if I miss a dose? If you miss a dose, take it as soon as you can. If it is almost time for your next dose, take only that dose. Do not take double or extra doses. What may interact with this medicine? Do not take this medicine with any of the following medications: -epoetin alfa This list may not describe all possible interactions. Give your health care provider a list of all the medicines, herbs, non-prescription drugs, or dietary supplements you use. Also tell them if you smoke, drink alcohol, or use illegal drugs. Some items may interact with your medicine. What should I watch for while using this medicine? Visit your prescriber or health care professional for regular checks on your progress and for the needed blood tests and blood pressure measurements. It is especially important for the doctor to make sure your hemoglobin level is in the desired range, to limit the risk of potential side effects and to give you the best benefit. Keep all appointments for any recommended tests. Check your blood pressure as directed. Ask your doctor what your blood pressure should be and when you should contact him or her. As your body makes more red blood cells, you may need to take iron, folic acid, or vitamin B supplements. Ask your doctor or health care provider which products are right for you. If you have kidney disease continue  dietary restrictions, even though this medication can make you feel better. Talk with your doctor or health care professional about the foods you eat and the vitamins that you take. What side effects may I notice from receiving this medicine? Side effects that you should report to your doctor or health care professional as soon as possible: -allergic reactions like skin rash, itching or hives, swelling of the face, lips, or tongue -breathing problems -changes in vision -chest pain -confusion, trouble speaking or understanding -feeling faint or lightheaded, falls -high blood pressure -muscle aches or pains -pain, swelling, warmth in the leg -rapid weight gain -severe headaches -sudden numbness or weakness of the face, arm or leg -trouble walking, dizziness, loss of balance or coordination -seizures (convulsions) -swelling of the ankles, feet, hands -unusually weak or tired Side effects that usually do not require medical attention (report to your doctor or health care professional if they continue or are  bothersome): -diarrhea -fever, chills (flu-like symptoms) -headaches -nausea, vomiting -redness, stinging, or swelling at site where injected This list may not describe all possible side effects. Call your doctor for medical advice about side effects. You may report side effects to FDA at 1-800-FDA-1088. Where should I keep my medicine? Keep out of the reach of children. Store in a refrigerator between 2 and 8 degrees C (36 and 46 degrees F). Do not freeze. Do not shake. Throw away any unused portion if using a single-dose vial. Throw away any unused medicine after the expiration date. NOTE: This sheet is a summary. It may not cover all possible information. If you have questions about this medicine, talk to your doctor, pharmacist, or health care provider.  2013, Elsevier/Gold Standard. (10/19/2008 10:23:57 AM)

## 2013-08-21 NOTE — Telephone Encounter (Signed)
gv adn printed appt sched and avs for pt for Jan April July and OCT....pt moved labs out a week .Lucien Mons out of town for holidays

## 2013-08-23 NOTE — Progress Notes (Signed)
[facility] OFFICE PROGRESS NOTE  SHAW,KIMBERLEE, MD [address] [place] [address]  DIAGNOSIS: Anemia associated with chronic renal failure - Plan: darbepoetin (ARANESP) injection 300 mcg  Chief Complaint  Patient presents with  . Anemia associated with chronic renal failure    CURRENT THERAPY: Aranesp 300 mcg q 3 months if hemoglobin less than 11  INTERVAL HISTORY: [name] 67 y.o. male with a history of anemia secondary to chronic kidney disease is here for follow-up. He was last seen by Dr. Arline Asp on 08/21/2012.  He has been checking his CBC every 2-3 months and for a hemoglobin less than 11, he receives aranesp 300 mcg subcutaneously.  He is doing very well.  He continues to work as a Pharmacologist.  He denies melena or hematochezia.  He reports pain in his legs bilaterally secondary to his diabetes relieved partially by amitriptyline.  He is following up with Dr. Alvester Morin for his prostate cancer.   He last received aranesp about 8 weeks ago.   MEDICAL HISTORY: Past Medical History  Diagnosis Date  . Anemia   . Renal insufficiency   . Hypertension   . Gout   . Diabetes mellitus   . Hypercholesterolemia   . Diverticulosis   . Sleep apnea   . History of nonmelanoma skin cancer     INTERIM HISTORY: has Anemia associated with chronic renal failure; Fever; DM (diabetes mellitus); CKD (chronic kidney disease); Anemia; HTN (hypertension); and Hyperlipidemia on his problem list.    ALLERGIES:  has No Known Allergies.  MEDICATIONS: has a current medication list which includes the following prescription(s): amitriptyline, amlodipine, atorvastatin, carvedilol, colchicine, exenatide er, febuxostat, glipizide, insulin glargine, one touch ultra test, quinapril, and trilipix.  SURGICAL HISTORY:  Past Surgical History  Procedure Laterality Date  . Total hip arthroplasty    . Mohs surgery      REVIEW OF SYSTEMS:   Constitutional:  Denies fevers, chills or abnormal weight loss Eyes: Denies blurriness of vision Ears, nose, mouth, throat, and face: Denies mucositis or sore throat Respiratory: Denies cough, dyspnea or wheezes Cardiovascular: Denies palpitation, chest discomfort or lower extremity swelling Gastrointestinal:  Denies nausea, heartburn or change in bowel habits Skin: Denies abnormal skin rashes Lymphatics: Denies new lymphadenopathy or easy bruising Neurological:Denies numbness, tingling or new weaknesses Behavioral/Psych: Mood is stable, no new changes  All other systems were reviewed with the patient and are negative.  PHYSICAL EXAMINATION: ECOG PERFORMANCE STATUS: 0 - Asymptomatic  Blood pressure 130/76, pulse 83, temperature 99 F (37.2 C), temperature source Oral, resp. rate 18, height 5\' 10"  (1.778 m), weight 229 lb 1.6 oz (103.919 kg), SpO2 98.00%.  GENERAL:alert, no distress and comfortable; Obesity. SKIN: skin color, texture, turgor are normal, no rashes or significant lesions EYES: normal, Conjunctiva are pink and non-injected, sclera clear OROPHARYNX:no exudate, no erythema and lips, buccal mucosa, and tongue normal  NECK: supple, thyroid normal size, non-tender, without nodularity LYMPH:  no palpable lymphadenopathy in the cervical, axillary or supraclavicular LUNGS: clear to auscultation and percussion with normal breathing effort HEART: regular rate & rhythm and no murmurs and no lower extremity edema ABDOMEN:abdomen soft, non-tender and normal bowel sounds Musculoskeletal:no cyanosis of digits and no clubbing  NEURO: alert & oriented x 3 with fluent speech, no focal motor/sensory deficits   LABORATORY DATA: No results found for this or any previous visit (from the past 48 hour(s)).     Labs:  Lab Results  Component Value Date  WBC 8.5 08/21/2013   HGB 10.7* 08/21/2013   HCT 30.9* 08/21/2013   MCV 84.1 08/21/2013   PLT 246 08/21/2013   NEUTROABS 3.3 08/21/2013      Chemistry       Component Value Date/Time   NA 147* 08/21/2013 1032   NA 136 09/21/2012 0600   K 3.6 08/21/2013 1032   K 3.9 09/21/2012 0600   CL 104 09/21/2012 0600   CL 112* 08/21/2012 1132   CO2 21* 08/21/2013 1032   CO2 22 09/21/2012 0600   BUN 20.0 08/21/2013 1032   BUN 12 09/21/2012 0600   CREATININE 1.8* 08/21/2013 1032   CREATININE 1.40* 09/21/2012 0600      Component Value Date/Time   CALCIUM 9.1 08/21/2013 1032   CALCIUM 8.7 09/21/2012 0600   ALKPHOS 58 08/21/2013 1032   ALKPHOS 45 09/18/2012 0500   AST 22 08/21/2013 1032   AST 37 09/18/2012 0500   ALT 26 08/21/2013 1032   ALT 20 09/18/2012 0500   BILITOT 0.22 08/21/2013 1032   BILITOT 0.4 09/18/2012 0500      Basic Metabolic Panel:  Recent Labs Lab 08/21/13 1032  NA 147*  K 3.6  CO2 21*  GLUCOSE 72  BUN 20.0  CREATININE 1.8*  CALCIUM 9.1   GFR Estimated Creatinine Clearance: 47.4 ml/min (by C-G formula based on Cr of 1.8). Liver Function Tests:  Recent Labs Lab 08/21/13 1032  AST 22  ALT 26  ALKPHOS 58  BILITOT 0.22  PROT 6.9  ALBUMIN 3.6   CBC:  Recent Labs Lab 08/21/13 1032  WBC 8.5  NEUTROABS 3.3  HGB 10.7*  HCT 30.9*  MCV 84.1  PLT 246   RADIOGRAPHIC STUDIES: No results found.  ASSESSMENT: [name] 68 y.o. male with a history of Anemia associated with chronic renal failure - Plan: darbepoetin (ARANESP) injection 300 mcg   PLAN:  [name] continues to do well.  We will continue aranesp 300 q 3 months for hemoglobin less than 11.  He will receive a shot today based on hemoglobin of 10.7.  We will plan to see him again in 1 year. We will check CBC and chemistries at that time.   All questions were answered. The patient knows to call the clinic with any problems, questions or concerns. We can certainly see the patient much sooner if necessary.  I spent 10 minutes counseling the patient face to face. The total time spent in the appointment was 15 minutes.    Klynn Linnemann, MD 08/21/2013  7:30 PM

## 2013-08-26 ENCOUNTER — Encounter: Payer: Self-pay | Admitting: Medical Oncology

## 2013-11-19 HISTORY — PX: BLEPHAROPLASTY: SUR158

## 2013-11-24 ENCOUNTER — Other Ambulatory Visit: Payer: Medicare Other

## 2014-02-05 ENCOUNTER — Telehealth: Payer: Self-pay | Admitting: Internal Medicine

## 2014-02-05 NOTE — Telephone Encounter (Signed)
pt called to cx appts @ [place]. per [facility]  [facility] care. message to desk nurse.

## 2014-02-17 ENCOUNTER — Other Ambulatory Visit: Payer: Medicare Other

## 2014-03-12 ENCOUNTER — Emergency Department (HOSPITAL_COMMUNITY): Payer: Non-veteran care

## 2014-03-12 ENCOUNTER — Inpatient Hospital Stay (HOSPITAL_COMMUNITY)
Admission: EM | Admit: 2014-03-12 | Discharge: 2014-03-14 | DRG: 065 | Disposition: A | Discharge status: Home / Self Care | Payer: Non-veteran care | Attending: Family Medicine | Admitting: Family Medicine

## 2014-03-12 ENCOUNTER — Other Ambulatory Visit: Payer: Self-pay

## 2014-03-12 ENCOUNTER — Encounter (HOSPITAL_COMMUNITY): Payer: Self-pay | Admitting: Emergency Medicine

## 2014-03-12 DIAGNOSIS — D649 Anemia, unspecified: Secondary | ICD-10-CM

## 2014-03-12 DIAGNOSIS — N189 Chronic kidney disease, unspecified: Secondary | ICD-10-CM

## 2014-03-12 DIAGNOSIS — Z96649 Presence of unspecified artificial hip joint: Secondary | ICD-10-CM

## 2014-03-12 DIAGNOSIS — M109 Gout, unspecified: Secondary | ICD-10-CM | POA: Diagnosis present

## 2014-03-12 DIAGNOSIS — Z794 Long term (current) use of insulin: Secondary | ICD-10-CM

## 2014-03-12 DIAGNOSIS — R509 Fever, unspecified: Secondary | ICD-10-CM

## 2014-03-12 DIAGNOSIS — Z7982 Long term (current) use of aspirin: Secondary | ICD-10-CM

## 2014-03-12 DIAGNOSIS — H538 Other visual disturbances: Secondary | ICD-10-CM | POA: Diagnosis not present

## 2014-03-12 DIAGNOSIS — I639 Cerebral infarction, unspecified: Secondary | ICD-10-CM

## 2014-03-12 DIAGNOSIS — I6330 Cerebral infarction due to thrombosis of unspecified cerebral artery: Secondary | ICD-10-CM | POA: Diagnosis not present

## 2014-03-12 DIAGNOSIS — Z8546 Personal history of malignant neoplasm of prostate: Secondary | ICD-10-CM

## 2014-03-12 DIAGNOSIS — G4730 Sleep apnea, unspecified: Secondary | ICD-10-CM | POA: Diagnosis present

## 2014-03-12 DIAGNOSIS — I10 Essential (primary) hypertension: Secondary | ICD-10-CM | POA: Diagnosis present

## 2014-03-12 DIAGNOSIS — R209 Unspecified disturbances of skin sensation: Secondary | ICD-10-CM | POA: Diagnosis present

## 2014-03-12 DIAGNOSIS — D631 Anemia in chronic kidney disease: Secondary | ICD-10-CM

## 2014-03-12 DIAGNOSIS — I6789 Other cerebrovascular disease: Secondary | ICD-10-CM | POA: Diagnosis present

## 2014-03-12 DIAGNOSIS — R279 Unspecified lack of coordination: Secondary | ICD-10-CM | POA: Diagnosis present

## 2014-03-12 DIAGNOSIS — E785 Hyperlipidemia, unspecified: Secondary | ICD-10-CM | POA: Diagnosis present

## 2014-03-12 DIAGNOSIS — Z85828 Personal history of other malignant neoplasm of skin: Secondary | ICD-10-CM

## 2014-03-12 DIAGNOSIS — N039 Chronic nephritic syndrome with unspecified morphologic changes: Secondary | ICD-10-CM

## 2014-03-12 DIAGNOSIS — E119 Type 2 diabetes mellitus without complications: Secondary | ICD-10-CM | POA: Diagnosis present

## 2014-03-12 DIAGNOSIS — Z79899 Other long term (current) drug therapy: Secondary | ICD-10-CM

## 2014-03-12 DIAGNOSIS — Z66 Do not resuscitate: Secondary | ICD-10-CM | POA: Diagnosis present

## 2014-03-12 HISTORY — DX: Toxic effect of herbicides and fungicides, accidental (unintentional), initial encounter: T60.3X1A

## 2014-03-12 HISTORY — DX: Malignant neoplasm of prostate: C61

## 2014-03-12 LAB — CBG MONITORING, ED: Glucose-Capillary: 106 mg/dL — ABNORMAL HIGH (ref 70–99)

## 2014-03-12 LAB — COMPREHENSIVE METABOLIC PANEL
ALBUMIN: 3.9 g/dL (ref 3.5–5.2)
ALT: 28 U/L (ref 0–53)
AST: 21 U/L (ref 0–37)
Alkaline Phosphatase: 116 U/L (ref 39–117)
BUN: 13 mg/dL (ref 6–23)
CALCIUM: 9.4 mg/dL (ref 8.4–10.5)
CHLORIDE: 103 meq/L (ref 96–112)
CO2: 24 mEq/L (ref 19–32)
CREATININE: 1.11 mg/dL (ref 0.50–1.35)
GFR calc Af Amer: 77 mL/min — ABNORMAL LOW (ref >90)
GFR calc non Af Amer: 66 mL/min — ABNORMAL LOW (ref >90)
Glucose, Bld: 109 mg/dL — ABNORMAL HIGH (ref 70–99)
Potassium: 3.7 mEq/L (ref 3.7–5.3)
Sodium: 141 mEq/L (ref 137–147)
Total Bilirubin: 0.3 mg/dL (ref 0.3–1.2)
Total Protein: 8 g/dL (ref 6.0–8.3)

## 2014-03-12 LAB — DIFFERENTIAL
BASOS ABS: 0.1 10*3/uL (ref 0.0–0.1)
BASOS PCT: 1 % (ref 0–1)
EOS PCT: 6 % — AB (ref 0–5)
Eosinophils Absolute: 0.4 10*3/uL (ref 0.0–0.7)
LYMPHS PCT: 33 % (ref 12–46)
Lymphs Abs: 2.5 10*3/uL (ref 0.7–4.0)
Monocytes Absolute: 0.5 10*3/uL (ref 0.1–1.0)
Monocytes Relative: 7 % (ref 3–12)
NEUTROS ABS: 4.2 10*3/uL (ref 1.7–7.7)
Neutrophils Relative %: 53 % (ref 43–77)

## 2014-03-12 LAB — CBC
HCT: 38.9 % — ABNORMAL LOW (ref 39.0–52.0)
Hemoglobin: 13.2 g/dL (ref 13.0–17.0)
MCH: 27.8 pg (ref 26.0–34.0)
MCHC: 33.9 g/dL (ref 30.0–36.0)
MCV: 82.1 fL (ref 78.0–100.0)
Platelets: 235 10*3/uL (ref 150–400)
RBC: 4.74 MIL/uL (ref 4.22–5.81)
RDW: 12.9 % (ref 11.5–15.5)
WBC: 7.8 10*3/uL (ref 4.0–10.5)

## 2014-03-12 LAB — PROTIME-INR
INR: 0.9 (ref 0.00–1.49)
PROTHROMBIN TIME: 12 s (ref 11.6–15.2)

## 2014-03-12 LAB — I-STAT TROPONIN, ED: Troponin i, poc: 0 ng/mL (ref 0.00–0.08)

## 2014-03-12 LAB — APTT: APTT: 30 s (ref 24–37)

## 2014-03-12 MED ORDER — LORAZEPAM 1 MG PO TABS
1.0000 mg | ORAL_TABLET | Freq: Once | ORAL | Status: AC
  Administered 2014-03-12: 1 mg via ORAL
  Filled 2014-03-12: qty 1

## 2014-03-12 NOTE — ED Notes (Signed)
Patient transported to MRI 

## 2014-03-12 NOTE — ED Provider Notes (Signed)
CSN: [id number]     Arrival date & time 03/12/14  1559 History   First MD Initiated Contact with Patient 03/12/14 2000     Chief Complaint  Patient presents with  . Stroke Symptoms     (Consider location/radiation/quality/duration/timing/severity/associated sxs/prior Treatment) HPI Comments: Patient's symptoms started yesterday with visual disturbances, lightheadedness, global head numbness that is now isolated to the left side of his face.  He called his wife to drive him home.  He ate fast food on the way home and when he arrived there vomited x1.  He slept through the night, but when he awoke he again had lightheadedness continues to have visual disturbances, and, this feeling of numbness on the left side of his face. He denies chest pain or shortness of breath.  States he has not had any medication changes.  His blood sugars have been within normal parameters for him.  He does have a history of high blood pressure, diabetes, gout, prostate cancer.  He is followed at the [facility] as well as locally by Dr. Serita Grammes  The history is provided by the patient.    Past Medical History  Diagnosis Date  . Anemia   . Renal insufficiency   . Hypertension   . Gout   . Diabetes mellitus   . Hypercholesterolemia   . Diverticulosis   . Sleep apnea   . History of nonmelanoma skin cancer   . Prostate cancer   . Agent Orange poisoning    Past Surgical History  Procedure Laterality Date  . Total hip arthroplasty    . Mohs surgery     History reviewed. No pertinent family history. History  Substance Use Topics  . Smoking status: Never Smoker   . Smokeless tobacco: Not on file  . Alcohol Use: No    Review of Systems  Constitutional: Negative for fever and chills.  Eyes: Positive for visual disturbance.  Respiratory: Negative for shortness of breath.   Cardiovascular: Negative for chest pain.  Gastrointestinal: Negative for abdominal pain.  Genitourinary: Negative for dysuria.   Musculoskeletal: Positive for gait problem. Negative for back pain and neck pain.  Skin: Negative for rash and wound.  Neurological: Positive for light-headedness. Negative for headaches.  All other systems reviewed and are negative.     Allergies  Review of patient's allergies indicates no known allergies.  Home Medications   Prior to Admission medications   Medication Sig Start Date End Date Taking? Authorizing Provider  amitriptyline (ELAVIL) 50 MG tablet Take 50 mg by mouth at bedtime.  10/06/11  Yes Historical Provider, MD  amLODipine (NORVASC) 10 MG tablet Take 10 mg by mouth daily.   Yes Historical Provider, MD  aspirin EC 81 MG tablet Take 81 mg by mouth at bedtime.   Yes Historical Provider, MD  atorvastatin (LIPITOR) 20 MG tablet Take 20 mg by mouth daily.   Yes Historical Provider, MD  carvedilol (COREG) 12.5 MG tablet Take 12.5 mg by mouth 2 (two) times daily with a meal.   Yes Historical Provider, MD  colchicine 0.6 MG tablet Take 0.6 mg by mouth daily as needed (gout).    Yes Historical Provider, MD  febuxostat (ULORIC) 40 MG tablet Take 40 mg by mouth.   Yes Historical Provider, MD  ferrous fumarate (HEMOCYTE - 106 MG FE) 325 (106 FE) MG TABS tablet Take 1 tablet by mouth every other day.   Yes Historical Provider, MD  glipiZIDE (GLUCOTROL XL) 10 MG 24 hr tablet Take 10 mg  by mouth 2 (two) times daily.   Yes Historical Provider, MD  HYDROcodone-acetaminophen (NORCO/VICODIN) 5-325 MG per tablet Take 1 tablet by mouth every 6 (six) hours as needed for moderate pain.   Yes Historical Provider, MD  hydrocortisone 2.5 % cream Apply 1 application topically at bedtime.   Yes Historical Provider, MD  insulin glargine (LANTUS) 100 UNIT/ML injection Inject 40 Units into the skin at bedtime.  09/22/12  Yes Adeline C Viyuoh, MD  lisinopril (PRINIVIL,ZESTRIL) 10 MG tablet Take 10 mg by mouth every evening.   Yes Historical Provider, MD  Pyridoxine HCl (VITAMIN B-6 PO) Take 2 tablets by  mouth every evening.   Yes Historical Provider, MD  Tetrahydrozoline HCl (EYE DROPS OP) Place 3 drops into both eyes daily as needed (dry eyes).   Yes Historical Provider, MD   BP 216/87  Pulse 71  Temp(Src) 97.6 F (36.4 C) (Oral)  Resp 18  Ht 5\' 10"  (1.778 m)  Wt 230 lb (104.327 kg)  BMI 33.00 kg/m2  SpO2 98% Physical Exam  Nursing note and vitals reviewed. Constitutional: He appears well-developed and well-nourished.  HENT:  Head: Normocephalic.  Right Ear: External ear normal.  Left Ear: External ear normal.  Mouth/Throat: Oropharynx is clear and moist.  Eyes: Pupils are equal, round, and reactive to light.  No visual field loss, but trouble focusing on individual words and letters  Neck: Normal range of motion.  Cardiovascular: Normal rate and regular rhythm.   Pulmonary/Chest: Effort normal and breath sounds normal.  Abdominal: Soft. Bowel sounds are normal. He exhibits no distension. There is no tenderness.  Musculoskeletal: He exhibits no edema and no tenderness.  Lymphadenopathy:    He has no cervical adenopathy.  Skin: Skin is warm and dry. No erythema.    ED Course  Procedures (including critical care time) Labs Review Labs Reviewed  CBC - Abnormal; Notable for the following:    HCT 38.9 (*)    All other components within normal limits  DIFFERENTIAL - Abnormal; Notable for the following:    Eosinophils Relative 6 (*)    All other components within normal limits  COMPREHENSIVE METABOLIC PANEL - Abnormal; Notable for the following:    Glucose, Bld 109 (*)    GFR calc non Af Amer 66 (*)    GFR calc Af Amer 77 (*)    All other components within normal limits  CBG MONITORING, ED - Abnormal; Notable for the following:    Glucose-Capillary 106 (*)    All other components within normal limits  PROTIME-INR  APTT  HEMOGLOBIN A1C  LIPID PANEL  I-STAT TROPOININ, ED    Imaging Review Ct Head (brain) Wo Contrast  03/12/2014   CLINICAL DATA:  Left-sided facial  and head numbness, dizziness, and blurred vision beginning yesterday. Emesis. Stroke symptoms.  EXAM: CT HEAD WITHOUT CONTRAST  TECHNIQUE: Contiguous axial images were obtained from the base of the skull through the vertex without intravenous contrast.  COMPARISON:  Maxillofacial CT 09/19/2012  FINDINGS: Mild age related cerebral volume loss is present. Periventricular white-matter hypodensities are nonspecific but compatible with mild chronic small vessel ischemic disease and grossly similar to prior CT. There is no evidence of acute cortical infarct, intracranial hemorrhage, mass, midline shift, or extra-axial fluid collection. Orbits are unremarkable. Mastoid air cells are clear. There is subtotal opacification of multiple ethmoid air cells bilaterally. Bilateral inferior frontal and left maxillary sinus mucosal thickening is also noted.  IMPRESSION: 1. No evidence of acute intracranial abnormality. 2. Mild  chronic small vessel ischemic disease. 3. Moderate paranasal sinus inflammatory mucosal disease.   Electronically Signed   By: Logan Bores   On: 03/12/2014 16:38   Mr Brain Wo Contrast  03/12/2014   CLINICAL DATA:  Dizziness, left facial numbness, blurry vision. Evaluate for stroke.  EXAM: MRI HEAD WITHOUT CONTRAST  TECHNIQUE: Multiplanar, multiecho pulse sequences of the brain and surrounding structures were obtained without intravenous contrast.  COMPARISON:  Prior CT performed earlier on the same day.  FINDINGS: Mild diffuse prominence of the CSF containing spaces is compatible with generalized cerebral atrophy. Scattered and confluent T2/FLAIR hyperintensities within the periventricular and deep white matter both cerebral hemispheres is compatible with moderate chronic small vessel ischemic changes. Small remote lacunar infarct noted within the periventricular white matter of the right corona radiata (series 7, image 18). No mass lesion, midline shift, or extra-axial fluid collection. Ventricles are  normal in size without evidence of hydrocephalus.  There is a subtly increased focus of restricted diffusion measuring approximately 8 mm within the right thalamus (series 5, image 18). There is subtly decreased signal intensity within this region on corresponding ADC map (series 500, image 17). Finding is suspicious for acute/ subacute ischemic infarct. Two additional small foci of restricted diffusion within the posterior limb of the internal capsule are also suspicious for possible infarct (series 5, image 18). No significant mass effect. Few scattered foci of mildly elevated DWI signal intensity within the left basal ganglia noted without definite ADC correlate, which may be related to T2 shine through. Gray-white matter differentiation is maintained. Normal flow voids are seen within the intracranial vasculature. No intracranial hemorrhage identified.  The cervicomedullary junction is normal. Pituitary gland is within normal limits. Pituitary stalk is midline. The globes and optic nerves demonstrate a normal appearance with normal signal intensity.  The bone marrow signal intensity is normal. Calvarium is intact. Visualized upper cervical spine is within normal limits.  Scalp soft tissues are unremarkable.  Mild polypoid mucosal thickening noted within the left maxillary sinus. There is scattered T2 fluid signal intensity within the ethmoidal air cells bilaterally. No mastoid effusion.  IMPRESSION: 1. Subtly increased 8 mm focus of restricted diffusion within the right thalamus, suspicious for acute/subacute ischemic infarct. Additional subcentimeter foci of restricted diffusion within the adjacent posterior limb of the right internal capsule also suspicious for small lacunar ischemic infarcts. No significant mass effect or intracranial hemorrhage identified. 2. Mild atrophy with moderate chronic microvascular ischemic disease involving the supratentorial white matter. 3. Paranasal sinus disease as above.    Electronically Signed   By: Jeannine Boga M.D.   On: 03/12/2014 23:32     EKG Interpretation None      MDM  Patients MR show acute/subacute infarcts Dr. Tyron Russell neurology has seen patient and recommends admission for continued evaluation  Final diagnoses:  CVA (cerebral infarction)        Garald Balding, NP 03/13/14 [phone]

## 2014-03-12 NOTE — Consult Note (Addendum)
Referring Physician: Doy Mince    Chief Complaint: Dizziness, left facial numbness, blurred vision  HPI: [name] is an 68 y.o. male who reports that on yesterday he had the acute onset of dizziness.  He also noted that he had blurry vision and felt somewhat confused.  Felt numbness all over his head.  When he ate dinner he vomited.  Went to bed last evening and when he awakened his mind was clearer but he continued to be dizzy.  Felt his vision was now worse in the left eye as compared to the right and was numb on the left side of the face instead of all over his face.  Went to PCP today at wife's insistence.  PCP sent patient to the ED.    Date last known well: Date: 03/11/2014 Time last known well: Time: 16:00 tPA Given: No: Outside time window  Past Medical History  Diagnosis Date  . Anemia   . Renal insufficiency   . Hypertension   . Gout   . Diabetes mellitus   . Hypercholesterolemia   . Diverticulosis   . Sleep apnea   . History of nonmelanoma skin cancer   . Prostate cancer   . Agent Orange poisoning     Past Surgical History  Procedure Laterality Date  . Total hip arthroplasty    . Mohs surgery      Family history:  Mother deceased.  She had dementia and HTN.  Father with CAD and died of an MI.   Social History:  reports that he has never smoked. He does not have any smokeless tobacco history on file. He reports that he does not drink alcohol or use illicit drugs.  Allergies: No Known Allergies  Medications: I have reviewed the patient's current medications. Prior to Admission:  Current outpatient prescriptions:amitriptyline (ELAVIL) 50 MG tablet, Take 50 mg by mouth at bedtime. , Disp: , Rfl: ;  amLODipine (NORVASC) 10 MG tablet, Take 10 mg by mouth daily., Disp: , Rfl: ;  aspirin EC 81 MG tablet, Take 81 mg by mouth at bedtime., Disp: , Rfl: ;  atorvastatin (LIPITOR) 20 MG tablet, Take 20 mg by mouth daily., Disp: , Rfl:  carvedilol (COREG) 12.5 MG tablet, Take  12.5 mg by mouth 2 (two) times daily with a meal., Disp: , Rfl: ;  colchicine 0.6 MG tablet, Take 0.6 mg by mouth daily as needed (gout). , Disp: , Rfl: ;  febuxostat (ULORIC) 40 MG tablet, Take 40 mg by mouth., Disp: , Rfl: ;  ferrous fumarate (HEMOCYTE - 106 MG FE) 325 (106 FE) MG TABS tablet, Take 1 tablet by mouth every other day., Disp: , Rfl:  glipiZIDE (GLUCOTROL XL) 10 MG 24 hr tablet, Take 10 mg by mouth 2 (two) times daily., Disp: , Rfl: ;  HYDROcodone-acetaminophen (NORCO/VICODIN) 5-325 MG per tablet, Take 1 tablet by mouth every 6 (six) hours as needed for moderate pain., Disp: , Rfl: ;  hydrocortisone 2.5 % cream, Apply 1 application topically at bedtime., Disp: , Rfl: ;  insulin glargine (LANTUS) 100 UNIT/ML injection, Inject 40 Units into the skin at bedtime. , Disp: , Rfl:  lisinopril (PRINIVIL,ZESTRIL) 10 MG tablet, Take 10 mg by mouth every evening., Disp: , Rfl: ;  Pyridoxine HCl (VITAMIN B-6 PO), Take 2 tablets by mouth every evening., Disp: , Rfl: ;  Tetrahydrozoline HCl (EYE DROPS OP), Place 3 drops into both eyes daily as needed (dry eyes)., Disp: , Rfl:   ROS: History obtained from the  patient  General ROS: negative for - chills, fatigue, fever, night sweats, weight gain or weight loss Psychological ROS: as noted in HPI Ophthalmic ROS: negative for - blurry vision, double vision, eye pain or loss of vision ENT ROS: HOH and wears hearing aids Allergy and Immunology ROS: negative for - hives or itchy/watery eyes Hematological and Lymphatic ROS: negative for - bleeding problems, bruising or swollen lymph nodes Endocrine ROS: negative for - galactorrhea, hair pattern changes, polydipsia/polyuria or temperature intolerance Respiratory ROS: negative for - cough, hemoptysis, shortness of breath or wheezing Cardiovascular ROS: negative for - chest pain, dyspnea on exertion, edema or irregular heartbeat Gastrointestinal ROS: as noted in HPI Genito-Urinary ROS: negative for - dysuria,  hematuria, incontinence or urinary frequency/urgency Musculoskeletal ROS: negative for - joint swelling or muscular weakness Neurological ROS: as noted in HPI Dermatological ROS: toe discoloration  Physical Examination: Blood pressure 174/82, pulse 58, temperature 98.2 F (36.8 C), resp. rate 17, height 5\' 10"  (1.778 m), weight 104.327 kg (230 lb), SpO2 99.00%.  Neurologic Examination: Mental Status: Alert, oriented, thought content appropriate.  Speech fluent without evidence of aphasia.  Able to follow 3 step commands without difficulty. Cranial Nerves: II: Discs flat bilaterally; Visual fields grossly normal, pupils equal, round, reactive to light and accommodation III,IV, VI: ptosis not present, extra-ocular motions intact bilaterally V,VII: decrease in left NLF, facial light touch sensation decreased on the left VIII: hearing normal bilaterally IX,X: gag reflex present XI: bilateral shoulder shrug XII: midline tongue extension Motor: Right : Upper extremity   5/5    Left:     Upper extremity   5/5  Lower extremity   5/5     Lower extremity   5/5 Tone and bulk:normal tone throughout; no atrophy noted Sensory: Pinprick and light touch intact throughout, bilaterally Deep Tendon Reflexes: 2+ and symmetric with absent AJ's bilaterally Plantars: Right: downgoing   Left: downgoing Cerebellar: normal finger-to-nose and normal heel-to-shin testing Gait: Unstable with standing CV: pulses palpable throughout     Laboratory Studies:  Basic Metabolic Panel:  Recent Labs Lab 03/12/14 1610  NA 141  K 3.7  CL 103  CO2 24  GLUCOSE 109*  BUN 13  CREATININE 1.11  CALCIUM 9.4    Liver Function Tests:  Recent Labs Lab 03/12/14 1610  AST 21  ALT 28  ALKPHOS 116  BILITOT 0.3  PROT 8.0  ALBUMIN 3.9   No results found for this basename: LIPASE, AMYLASE,  in the last 168 hours No results found for this basename: AMMONIA,  in the last 168 hours  CBC:  Recent Labs Lab  03/12/14 1610  WBC 7.8  NEUTROABS 4.2  HGB 13.2  HCT 38.9*  MCV 82.1  PLT 235    Cardiac Enzymes: No results found for this basename: CKTOTAL, CKMB, CKMBINDEX, TROPONINI,  in the last 168 hours  BNP: No components found with this basename: POCBNP,   CBG:  Recent Labs Lab 03/12/14 [place] 106*    Microbiology: Results for orders placed during the hospital encounter of 09/17/12  CULTURE, BLOOD (ROUTINE X 2)     Status: None   Collection Time    09/17/12  4:00 PM      Result Value Ref Range Status   Specimen Description BLOOD ARM RIGHT   Final   Special Requests     Final   Value: BOTTLES DRAWN AEROBIC AND ANAEROBIC BLUE 10CC RED 5CC   Culture  Setup Time 09/18/2012 00:11   Final  Culture NO GROWTH 5 DAYS   Final   Report Status 09/24/2012 FINAL   Final  CULTURE, BLOOD (ROUTINE X 2)     Status: None   Collection Time    09/17/12  4:20 PM      Result Value Ref Range Status   Specimen Description BLOOD HAND LEFT   Final   Special Requests BOTTLES DRAWN AEROBIC ONLY 5CC   Final   Culture  Setup Time 09/18/2012 00:11   Final   Culture NO GROWTH 5 DAYS   Final   Report Status 09/24/2012 FINAL   Final  URINE CULTURE     Status: None   Collection Time    09/17/12  6:37 PM      Result Value Ref Range Status   Specimen Description URINE, RANDOM   Final   Special Requests NONE   Final   Culture  Setup Time 09/17/2012 20:10   Final   Colony Count NO GROWTH   Final   Culture NO GROWTH   Final   Report Status 09/18/2012 FINAL   Final  MRSA PCR SCREENING     Status: None   Collection Time    09/18/12  2:17 AM      Result Value Ref Range Status   MRSA by PCR NEGATIVE  NEGATIVE Final   Comment:            The GeneXpert MRSA Assay (FDA     approved for NASAL specimens     only), is one component of a     comprehensive MRSA colonization     surveillance program. It is not     intended to diagnose MRSA     infection nor to guide or     monitor treatment for      MRSA infections.    Coagulation Studies:  Recent Labs  03/12/14 1610  LABPROT 12.0  INR 0.90    Urinalysis: No results found for this basename: COLORURINE, APPERANCEUR, LABSPEC, PHURINE, GLUCOSEU, HGBUR, BILIRUBINUR, KETONESUR, PROTEINUR, UROBILINOGEN, NITRITE, LEUKOCYTESUR,  in the last 168 hours  Lipid Panel: No results found for this basename: chol, trig, hdl, cholhdl, vldl, ldlcalc    HgbA1C:  No results found for this basename: HGBA1C    Urine Drug Screen:   No results found for this basename: labopia, cocainscrnur, labbenz, amphetmu, thcu, labbarb    Alcohol Level: No results found for this basename: ETH,  in the last 168 hours  Other results: EKG: normal sinus rhythm at 71 bpm.  Imaging: Ct Head (brain) Wo Contrast  03/12/2014   CLINICAL DATA:  Left-sided facial and head numbness, dizziness, and blurred vision beginning yesterday. Emesis. Stroke symptoms.  EXAM: CT HEAD WITHOUT CONTRAST  TECHNIQUE: Contiguous axial images were obtained from the base of the skull through the vertex without intravenous contrast.  COMPARISON:  Maxillofacial CT 09/19/2012  FINDINGS: Mild age related cerebral volume loss is present. Periventricular white-matter hypodensities are nonspecific but compatible with mild chronic small vessel ischemic disease and grossly similar to prior CT. There is no evidence of acute cortical infarct, intracranial hemorrhage, mass, midline shift, or extra-axial fluid collection. Orbits are unremarkable. Mastoid air cells are clear. There is subtotal opacification of multiple ethmoid air cells bilaterally. Bilateral inferior frontal and left maxillary sinus mucosal thickening is also noted.  IMPRESSION: 1. No evidence of acute intracranial abnormality. 2. Mild chronic small vessel ischemic disease. 3. Moderate paranasal sinus inflammatory mucosal disease.   Electronically Signed   By: Logan Bores  On: 03/12/2014 16:38    Assessment: 68 y.o. male presenting with  blurry vision, left facial numbness and gait ataxia.  Head CT reviewed and shows no acute changes.  With multiple vascular risk factors.  On ASA at home.  Further work up recommended.  Stroke Risk Factors - diabetes mellitus, hyperlipidemia and hypertension  Plan: 1. HgbA1c, fasting lipid panel 2. MRI, MRA  of the brain without contrast 3. PT consult, OT consult, Speech consult 4. Echocardiogram 5. Prophylactic therapy-Antiplatelet med: Aspirin - dose 325mg  daily 6. Risk factor modification 7. Telemetry monitoring 8. Frequent neuro checks  Alexis Goodell, MD [facility] [phone] 03/12/2014, 9:49 PM

## 2014-03-12 NOTE — ED Notes (Signed)
Presents with left sided facial and head numbness, dizziness and blurred vision began last night at 4 pm. Reports emesis x1. Went to [facility] today and was sent here. HE states, "it feels like I am spinning" bilateral grips equal, no drift or droop, no slurred speech. Denies headache. Not taking blood thinners.  Alert, oriented and MAEx4. Answers all questions appropriately.

## 2014-03-12 NOTE — ED Notes (Signed)
Patient states that his symptoms have improved. Vision has improved and facial numbness and tingling has improved.

## 2014-03-12 NOTE — ED Notes (Signed)
Dr. Beverly Milch, Neuro at bedside.

## 2014-03-13 DIAGNOSIS — E785 Hyperlipidemia, unspecified: Secondary | ICD-10-CM

## 2014-03-13 DIAGNOSIS — Z794 Long term (current) use of insulin: Secondary | ICD-10-CM | POA: Diagnosis not present

## 2014-03-13 DIAGNOSIS — G4730 Sleep apnea, unspecified: Secondary | ICD-10-CM | POA: Diagnosis present

## 2014-03-13 DIAGNOSIS — I10 Essential (primary) hypertension: Secondary | ICD-10-CM | POA: Diagnosis present

## 2014-03-13 DIAGNOSIS — M109 Gout, unspecified: Secondary | ICD-10-CM | POA: Diagnosis present

## 2014-03-13 DIAGNOSIS — D631 Anemia in chronic kidney disease: Secondary | ICD-10-CM | POA: Diagnosis present

## 2014-03-13 DIAGNOSIS — H538 Other visual disturbances: Secondary | ICD-10-CM | POA: Diagnosis present

## 2014-03-13 DIAGNOSIS — R279 Unspecified lack of coordination: Secondary | ICD-10-CM | POA: Diagnosis present

## 2014-03-13 DIAGNOSIS — I6350 Cerebral infarction due to unspecified occlusion or stenosis of unspecified cerebral artery: Secondary | ICD-10-CM

## 2014-03-13 DIAGNOSIS — R209 Unspecified disturbances of skin sensation: Secondary | ICD-10-CM | POA: Diagnosis present

## 2014-03-13 DIAGNOSIS — Z79899 Other long term (current) drug therapy: Secondary | ICD-10-CM | POA: Diagnosis not present

## 2014-03-13 DIAGNOSIS — N19 Unspecified kidney failure: Secondary | ICD-10-CM

## 2014-03-13 DIAGNOSIS — Z96649 Presence of unspecified artificial hip joint: Secondary | ICD-10-CM | POA: Diagnosis not present

## 2014-03-13 DIAGNOSIS — I6330 Cerebral infarction due to thrombosis of unspecified cerebral artery: Secondary | ICD-10-CM | POA: Diagnosis present

## 2014-03-13 DIAGNOSIS — Z7982 Long term (current) use of aspirin: Secondary | ICD-10-CM | POA: Diagnosis not present

## 2014-03-13 DIAGNOSIS — D649 Anemia, unspecified: Secondary | ICD-10-CM

## 2014-03-13 DIAGNOSIS — I6789 Other cerebrovascular disease: Secondary | ICD-10-CM | POA: Diagnosis present

## 2014-03-13 DIAGNOSIS — Z85828 Personal history of other malignant neoplasm of skin: Secondary | ICD-10-CM | POA: Diagnosis not present

## 2014-03-13 DIAGNOSIS — E119 Type 2 diabetes mellitus without complications: Secondary | ICD-10-CM | POA: Diagnosis present

## 2014-03-13 DIAGNOSIS — Z66 Do not resuscitate: Secondary | ICD-10-CM | POA: Diagnosis present

## 2014-03-13 DIAGNOSIS — N189 Chronic kidney disease, unspecified: Secondary | ICD-10-CM

## 2014-03-13 DIAGNOSIS — Z8546 Personal history of malignant neoplasm of prostate: Secondary | ICD-10-CM | POA: Diagnosis not present

## 2014-03-13 DIAGNOSIS — I517 Cardiomegaly: Secondary | ICD-10-CM

## 2014-03-13 LAB — CBC WITH DIFFERENTIAL/PLATELET
BASOS ABS: 0 10*3/uL (ref 0.0–0.1)
BASOS PCT: 0 % (ref 0–1)
Eosinophils Absolute: 0.4 10*3/uL (ref 0.0–0.7)
Eosinophils Relative: 5 % (ref 0–5)
HEMATOCRIT: 32.1 % — AB (ref 39.0–52.0)
Hemoglobin: 10.7 g/dL — ABNORMAL LOW (ref 13.0–17.0)
Lymphocytes Relative: 43 % (ref 12–46)
Lymphs Abs: 3.1 10*3/uL (ref 0.7–4.0)
MCH: 27.7 pg (ref 26.0–34.0)
MCHC: 33.3 g/dL (ref 30.0–36.0)
MCV: 83.2 fL (ref 78.0–100.0)
MONO ABS: 0.5 10*3/uL (ref 0.1–1.0)
Monocytes Relative: 8 % (ref 3–12)
Neutro Abs: 3.2 10*3/uL (ref 1.7–7.7)
Neutrophils Relative %: 44 % (ref 43–77)
Platelets: 210 10*3/uL (ref 150–400)
RBC: 3.86 MIL/uL — ABNORMAL LOW (ref 4.22–5.81)
RDW: 12.9 % (ref 11.5–15.5)
WBC: 7.2 10*3/uL (ref 4.0–10.5)

## 2014-03-13 LAB — LIPID PANEL
Cholesterol: 150 mg/dL (ref 0–200)
HDL: 32 mg/dL — AB (ref >39)
LDL CALC: 78 mg/dL (ref 0–99)
TRIGLYCERIDES: 200 mg/dL — AB (ref <150)
Total CHOL/HDL Ratio: 4.7 RATIO
VLDL: 40 mg/dL (ref 0–40)

## 2014-03-13 LAB — COMPREHENSIVE METABOLIC PANEL
ALT: 22 U/L (ref 0–53)
AST: 18 U/L (ref 0–37)
Albumin: 3.1 g/dL — ABNORMAL LOW (ref 3.5–5.2)
Alkaline Phosphatase: 104 U/L (ref 39–117)
BUN: 15 mg/dL (ref 6–23)
CALCIUM: 8.7 mg/dL (ref 8.4–10.5)
CO2: 24 mEq/L (ref 19–32)
Chloride: 106 mEq/L (ref 96–112)
Creatinine, Ser: 1.07 mg/dL (ref 0.50–1.35)
GFR calc Af Amer: 80 mL/min — ABNORMAL LOW (ref >90)
GFR, EST NON AFRICAN AMERICAN: 69 mL/min — AB (ref >90)
Glucose, Bld: 191 mg/dL — ABNORMAL HIGH (ref 70–99)
Potassium: 3.5 mEq/L — ABNORMAL LOW (ref 3.7–5.3)
Sodium: 144 mEq/L (ref 137–147)
Total Bilirubin: 0.2 mg/dL — ABNORMAL LOW (ref 0.3–1.2)
Total Protein: 6.5 g/dL (ref 6.0–8.3)

## 2014-03-13 LAB — GLUCOSE, CAPILLARY
GLUCOSE-CAPILLARY: 222 mg/dL — AB (ref 70–99)
Glucose-Capillary: 236 mg/dL — ABNORMAL HIGH (ref 70–99)
Glucose-Capillary: 130 mg/dL — ABNORMAL HIGH (ref 70–99)
Glucose-Capillary: 155 mg/dL — ABNORMAL HIGH (ref 70–99)
Glucose-Capillary: 138 mg/dL — ABNORMAL HIGH (ref 70–99)

## 2014-03-13 LAB — HEMOGLOBIN A1C
Hgb A1c MFr Bld: 9.2 % — ABNORMAL HIGH (ref <5.7)
Mean Plasma Glucose: 217 mg/dL — ABNORMAL HIGH (ref <117)

## 2014-03-13 MED ORDER — FEBUXOSTAT 40 MG PO TABS
40.0000 mg | ORAL_TABLET | Freq: Every day | ORAL | Status: DC
  Administered 2014-03-13 – 2014-03-14 (×2): 40 mg via ORAL
  Filled 2014-03-13 (×2): qty 1

## 2014-03-13 MED ORDER — COLCHICINE 0.6 MG PO TABS
0.6000 mg | ORAL_TABLET | Freq: Every day | ORAL | Status: DC | PRN
  Filled 2014-03-13: qty 1

## 2014-03-13 MED ORDER — LISINOPRIL 10 MG PO TABS
10.0000 mg | ORAL_TABLET | Freq: Every evening | ORAL | Status: DC
  Administered 2014-03-13: 10 mg via ORAL
  Filled 2014-03-13 (×2): qty 1

## 2014-03-13 MED ORDER — ENOXAPARIN SODIUM 40 MG/0.4ML ~~LOC~~ SOLN
40.0000 mg | SUBCUTANEOUS | Status: DC
  Administered 2014-03-13 – 2014-03-14 (×2): 40 mg via SUBCUTANEOUS
  Filled 2014-03-13 (×2): qty 0.4

## 2014-03-13 MED ORDER — ASPIRIN EC 325 MG PO TBEC
325.0000 mg | DELAYED_RELEASE_TABLET | Freq: Every day | ORAL | Status: DC
  Administered 2014-03-13 – 2014-03-14 (×2): 325 mg via ORAL
  Filled 2014-03-13 (×3): qty 1

## 2014-03-13 MED ORDER — ASPIRIN 81 MG PO CHEW
324.0000 mg | CHEWABLE_TABLET | Freq: Once | ORAL | Status: AC
  Administered 2014-03-13: 324 mg via ORAL
  Filled 2014-03-13: qty 4

## 2014-03-13 MED ORDER — FERROUS FUMARATE 325 (106 FE) MG PO TABS
1.0000 | ORAL_TABLET | ORAL | Status: DC
  Administered 2014-03-13: 106 mg via ORAL
  Filled 2014-03-13: qty 1

## 2014-03-13 MED ORDER — HYDROCODONE-ACETAMINOPHEN 5-325 MG PO TABS: 1.0000 | ORAL_TABLET | Freq: Four times a day (QID) | ORAL | Status: DC | PRN

## 2014-03-13 MED ORDER — HYDRALAZINE HCL 20 MG/ML IJ SOLN: 10.0000 mg | INTRAMUSCULAR | Status: DC | PRN

## 2014-03-13 MED ORDER — INSULIN ASPART 100 UNIT/ML ~~LOC~~ SOLN
0.0000 [IU] | Freq: Three times a day (TID) | SUBCUTANEOUS | Status: DC
  Administered 2014-03-13: 5 [IU] via SUBCUTANEOUS
  Administered 2014-03-13: 3 [IU] via SUBCUTANEOUS
  Administered 2014-03-13: 2 [IU] via SUBCUTANEOUS

## 2014-03-13 MED ORDER — AMLODIPINE BESYLATE 10 MG PO TABS
10.0000 mg | ORAL_TABLET | Freq: Every day | ORAL | Status: DC
  Administered 2014-03-13 – 2014-03-14 (×2): 10 mg via ORAL
  Filled 2014-03-13 (×2): qty 1

## 2014-03-13 MED ORDER — INSULIN ASPART 100 UNIT/ML ~~LOC~~ SOLN
0.0000 [IU] | Freq: Every day | SUBCUTANEOUS | Status: DC
  Administered 2014-03-13: 3 [IU] via SUBCUTANEOUS

## 2014-03-13 MED ORDER — AMITRIPTYLINE HCL 50 MG PO TABS
50.0000 mg | ORAL_TABLET | Freq: Every day | ORAL | Status: DC
  Administered 2014-03-13 (×2): 50 mg via ORAL
  Filled 2014-03-13 (×3): qty 1

## 2014-03-13 MED ORDER — ATORVASTATIN CALCIUM 20 MG PO TABS
20.0000 mg | ORAL_TABLET | Freq: Every day | ORAL | Status: DC
  Administered 2014-03-13 – 2014-03-14 (×2): 20 mg via ORAL
  Filled 2014-03-13 (×2): qty 1

## 2014-03-13 MED ORDER — CARVEDILOL 12.5 MG PO TABS
12.5000 mg | ORAL_TABLET | Freq: Two times a day (BID) | ORAL | Status: DC
  Administered 2014-03-13 – 2014-03-14 (×3): 12.5 mg via ORAL
  Filled 2014-03-13 (×5): qty 1

## 2014-03-13 MED ORDER — INSULIN GLARGINE 100 UNIT/ML ~~LOC~~ SOLN
40.0000 [IU] | Freq: Every day | SUBCUTANEOUS | Status: DC
  Administered 2014-03-13 (×2): 40 [IU] via SUBCUTANEOUS
  Filled 2014-03-13 (×3): qty 0.4

## 2014-03-13 MED ORDER — SENNOSIDES-DOCUSATE SODIUM 8.6-50 MG PO TABS
1.0000 | ORAL_TABLET | Freq: Every evening | ORAL | Status: DC | PRN
Start: 2014-03-13 — End: 2014-03-14

## 2014-03-13 NOTE — ED Provider Notes (Signed)
Medical screening examination/treatment/procedure(s) were conducted as a shared visit with non-physician practitioner(s) and myself.  I personally evaluated the patient during the encounter.   EKG Interpretation None        Houston Siren III, MD 03/13/14 [phone]

## 2014-03-13 NOTE — ED Provider Notes (Signed)
Medical screening examination/treatment/procedure(s) were conducted as a shared visit with non-physician practitioner(s) and myself.  I personally evaluated the patient during the encounter.   EKG Interpretation None      68 yo male presenting with left facial numbness, blurry vision, and gait ataxia.  On exam, well appearing, nontoxic, not distressed, normal respiratory effort, normal perfusions, CN intact except for slight flattening of nasolabial fold on left, normal strength.  Plan MRI and admission.   Clinical Impression: 1. CVA (cerebral infarction)   2. CKD (chronic kidney disease)   3. DM (diabetes mellitus)   4. HTN (hypertension)       Houston Siren III, MD 03/13/14 [phone]

## 2014-03-13 NOTE — Progress Notes (Signed)
Stroke Team Progress Note  HISTORY [name] is a 68 y.o. male who reported that on 03/11/2014 he had the acute onset of dizziness. He also noted that he had blurry vision and felt somewhat confused. Felt numbness all over his head. When he ate dinner he vomited. Went to bed that evening and when he awakened his mind was clearer but he continued to be dizzy. Felt his vision was worse in the left eye as compared to the right and was numb on the left side of the face instead of all over his face. Went to PCP on the day of admission, 03/12/2014, at wife's insistence. PCP sent patient to the ED .  Date last known well: Date: 03/11/2014  Time last known well: Time: 16:00  tPA Given: No: Outside time window   SUBJECTIVE There no family members present. The patient still has some left facial numbness but otherwise feels improved.  OBJECTIVE Most recent Vital Signs: Filed Vitals:   03/13/14 0037 03/13/14 0200 03/13/14 0400 03/13/14 0600  BP: 173/66 216/87 152/66 128/63  Pulse: 66 71 82 76  Temp:  97.6 F (36.4 C) 97.5 F (36.4 C) 97.8 F (36.6 C)  TempSrc:  Oral Oral Oral  Resp: 17 18 18 18   Height:  5\' 10"  (1.778 m)    Weight:      SpO2: 99% 98% 99% 99%   CBG (last 3)   Recent Labs  03/12/14 1703 03/13/14 0224 03/13/14 0635  GLUCAP 106* 236* 222*    IV Fluid Intake:     MEDICATIONS  . amitriptyline  50 mg Oral QHS  . amLODipine  10 mg Oral Daily  . aspirin EC  325 mg Oral Daily  . atorvastatin  20 mg Oral Daily  . carvedilol  12.5 mg Oral BID WC  . enoxaparin (LOVENOX) injection  40 mg Subcutaneous Q24H  . febuxostat  40 mg Oral Daily  . ferrous fumarate  1 tablet Oral QODAY  . insulin aspart  0-15 Units Subcutaneous TID WC  . insulin aspart  0-5 Units Subcutaneous QHS  . insulin glargine  40 Units Subcutaneous QHS  . lisinopril  10 mg Oral QPM   PRN:  colchicine, hydrALAZINE, HYDROcodone-acetaminophen, senna-docusate  Diet:  Carb Control thin liquids Activity:  Up  with assistance DVT Prophylaxis:  Lovenox  CLINICALLY SIGNIFICANT STUDIES Basic Metabolic Panel:  Recent Labs Lab 03/12/14 1610  NA 141  K 3.7  CL 103  CO2 24  GLUCOSE 109*  BUN 13  CREATININE 1.11  CALCIUM 9.4   Liver Function Tests:  Recent Labs Lab 03/12/14 1610  AST 21  ALT 28  ALKPHOS 116  BILITOT 0.3  PROT 8.0  ALBUMIN 3.9   CBC:  Recent Labs Lab 03/12/14 1610  WBC 7.8  NEUTROABS 4.2  HGB 13.2  HCT 38.9*  MCV 82.1  PLT 235   Coagulation:  Recent Labs Lab 03/12/14 1610  LABPROT 12.0  INR 0.90   Cardiac Enzymes: No results found for this basename: CKTOTAL, CKMB, CKMBINDEX, TROPONINI,  in the last 168 hours Urinalysis: No results found for this basename: COLORURINE, APPERANCEUR, LABSPEC, PHURINE, GLUCOSEU, HGBUR, BILIRUBINUR, KETONESUR, PROTEINUR, UROBILINOGEN, NITRITE, LEUKOCYTESUR,  in the last 168 hours Lipid Panel    Component Value Date/Time   CHOL 150 03/13/2014 0550   TRIG 200* 03/13/2014 0550   HDL 32* 03/13/2014 0550   CHOLHDL 4.7 03/13/2014 0550   VLDL 40 03/13/2014 0550   LDLCALC 78 03/13/2014 0550   HgbA1C  No results found for this basename: HGBA1C    Urine Drug Screen:   No results found for this basename: labopia, cocainscrnur, labbenz, amphetmu, thcu, labbarb    Alcohol Level: No results found for this basename: ETH,  in the last 168 hours  Ct Head (brain) Wo Contrast 03/12/2014    1. No evidence of acute intracranial abnormality. 2. Mild chronic small vessel ischemic disease. 3. Moderate paranasal sinus inflammatory mucosal disease.    Mr Brain Wo Contrast 03/12/2014    1. Subtly increased 8 mm focus of restricted diffusion within the right thalamus, suspicious for acute/subacute ischemic infarct. Additional subcentimeter foci of restricted diffusion within the adjacent posterior limb of the right internal capsule also suspicious for small lacunar ischemic infarcts. No significant mass effect or intracranial hemorrhage identified.  2. Mild atrophy with moderate chronic microvascular ischemic disease involving the supratentorial white matter. 3. Paranasal sinus disease as above.     MRA of the brain    2D Echocardiogram  pending  Carotid Doppler pending  CXR    EKG  Sinus rhythm rate 71 beats per minute. For complete results please see formal report.   Therapy Recommendations pending  Physical Exam   Neurologic Examination:  Mental Status:  Alert, oriented, thought content appropriate. Speech fluent without evidence of aphasia. Able to follow 3 step commands without difficulty.  Cranial Nerves:  II: Discs flat bilaterally; Visual fields grossly normal, pupils equal, round, reactive to light and accommodation  III,IV, VI: ptosis not present, extra-ocular motions intact bilaterally  V,VII: decrease in left NLF, facial light touch sensation decreased on the left  VIII: hearing normal bilaterally  IX,X: gag reflex present  XI: bilateral shoulder shrug  XII: midline tongue extension  Motor:  Right : Upper extremity 5/5 Left: Upper extremity 5/5  Lower extremity 5/5 Lower extremity 5/5  Tone and bulk:normal tone throughout; no atrophy noted  Sensory: Pinprick and light touch intact throughout, bilaterally  Deep Tendon Reflexes: 2+ and symmetric with absent AJ's bilaterally  Plantars:  Right: downgoing Left: downgoing  Cerebellar:  normal finger-to-nose and normal heel-to-shin testing  Gait: Deferred   ASSESSMENT [name] is a 68 y.o. male presenting with dizziness, left facial numbness, and blurred vision. The patient was outside the window for t-PA therapy.MRI - subtly increased 8 mm focus of restricted diffusion within the right thalamus, suspicious for acute/subacute ischemic infarct. Additional subcentimeter foci of restricted diffusion within the adjacent posterior limb of the right internal capsule also suspicious for small lacunar ischemic infarcts. Infarcts felt to be thrombotic secondary to  small vessel disease. On aspirin 81 mg daily prior to admission. Now on aspirin 325 mg orally every day for secondary stroke prevention. Patient with resultant mild residual left facial numbness. Stroke work up underway.   Diabetes mellitus - hemoglobin A1c - pending  Hyperlipidemia - cholesterol 150; LDL 78 - on Lipitor prior to admission.  Hypertension   Hospital day # 1  TREATMENT/PLAN  Continue aspirin 325 mg daily for secondary stroke prevention.  Await 2-D echo, carotid Dopplers, and hemoglobin A1c.  Await therapy evaluations  Check PFA   Mikey Bussing PA-C [facility] Pager [phone] 03/13/2014, 7:50 AM  I have personally obtained a history, examined the patient, evaluated imaging results, and formulated the assessment and plan of care. I agree with the above.    To contact Stroke Continuity provider, please refer to [email or web address] After hours, contact General Neurology

## 2014-03-13 NOTE — Progress Notes (Signed)
VASCULAR LAB PRELIMINARY  PRELIMINARY  PRELIMINARY  PRELIMINARY  Carotid Dopplers completed.    Preliminary report:  There is 1-39% ICA stenosis.  Vertebral artery flow is antegrade.  Iantha Fallen, RVT 03/13/2014, 11:07 AM

## 2014-03-13 NOTE — Progress Notes (Signed)
VASCULAR LAB PRELIMINARY  PRELIMINARY  PRELIMINARY  PRELIMINARY  Bilateral lower extremity venous Dopplers completed.    Preliminary report:  There is no DVT or SVT noted in the bilateral lower extremities.   Iantha Fallen, RVT 03/13/2014, 11:08 AM

## 2014-03-13 NOTE — Progress Notes (Signed)
Patient transferred from ED, BP and CBG were high Dr Posey Pronto ordered insulin and BP medications BP is back to patients normal range, telemetry on and rhythm is normal sinus. Stroke swallow screen passed in ED I orderd carb modified diet also provided fall, [presure ulcer and stroke educational material, NIH was 0 and patient is a low fall risk at this time. Patient has no pain and is resting comfortable will continue to monitor.

## 2014-03-13 NOTE — Progress Notes (Signed)
[facility] PROGRESS NOTE  [name] BPZ:[id number] DOB: 09-03-1946 DOA: 03/12/2014 PCP: Mayra Neer, MD  Assessment/Plan:  Acute/subacute thalamic infarct-  MRI shows subcentimeter foci of restricted diffusion within the adjacent posterior limb of the right internal capsule also suspicious for small lacunar ischemic infarcts. Infarcts felt to be thrombotic secondary to small vessel disease Patient started on aspirin 325 mg po daily. Carotid doppler shows 1-39% ICA stenosis. Vertebral artery flow is antegrade. 2d echo is pending, await PT recommendations.  Anemia- Hb dropped to 10.7 from 13.2, will check stool for occult blood.  Hypertension- Continue Coreg 12.5 mg po BID, Lisinopril 10 mg po daily.  Diabetes mellitus- Continue SSI, lantus.   Code Status: DNR Family Communication: No family at bedside Disposition Plan:  Home when stable   Consultants:  None  Procedures:  None  Antibiotics:  *None  HPI/Subjective: 68 y.o. male with Past medical history of hypertension, anemia, gout, diabetes, sleep apnea, prostate cancer.  The patient presented with complaints of dizziness and blurring of vision. He mentions due before yesterday around 4 PM he started having some dizziness this was followed by blurring of vision and vomiting. He slept over at when he woke up in the morning he found that the vertigo the patient was improved but he continues to have dizziness with difficulty walking around. He started having tingling which was followed by numbness on the left side of the face. With persistent symptoms he went to [facility] and from PCPs office he was sent to the ED.  He denies any similar episodes prior. He denies any chest pain, shortness of breath, palpitation, nausea, vomiting, abdominal pain, diarrhea, burning urination, leg swelling. Extent he denies any fall, syncope, trauma.   Patient seen and examined, no new complaints.   Objective: Filed Vitals:   03/13/14 1415  BP: 164/66  Pulse: 72  Temp: 97.5 F (36.4 C)  Resp: 18   No intake or output data in the 24 hours ending 03/13/14 1420 Filed Weights   03/12/14 1606 03/12/14 1905  Weight: 102.059 kg (225 lb) 104.327 kg (230 lb)    Exam:  Physical Exam: Head: Normocephalic, atraumatic.  Eyes: No signs of jaundice, EOMI Nose: Mucous membranes dry.  Throat: Oropharynx nonerythematous, no exudate appreciated.  Neck: supple,No deformities, masses, or tenderness noted. Lungs: Normal respiratory effort. B/L Clear to auscultation, no crackles or wheezes.  Heart: Regular RR. S1 and S2 normal  Abdomen: BS normoactive. Soft, Nondistended, non-tender.  Extremities: No pretibial edema, no erythema  Data Reviewed: Basic Metabolic Panel:  Recent Labs Lab 03/12/14 1610 03/13/14 0810  NA 141 144  K 3.7 3.5*  CL 103 106  CO2 24 24  GLUCOSE 109* 191*  BUN 13 15  CREATININE 1.11 1.07  CALCIUM 9.4 8.7   Liver Function Tests:  Recent Labs Lab 03/12/14 1610 03/13/14 0810  AST 21 18  ALT 28 22  ALKPHOS 116 104  BILITOT 0.3 0.2*  PROT 8.0 6.5  ALBUMIN 3.9 3.1*   No results found for this basename: LIPASE, AMYLASE,  in the last 168 hours No results found for this basename: AMMONIA,  in the last 168 hours CBC:  Recent Labs Lab 03/12/14 1610 03/13/14 0810  WBC 7.8 7.2  NEUTROABS 4.2 3.2  HGB 13.2 10.7*  HCT 38.9* 32.1*  MCV 82.1 83.2  PLT 235 210   Cardiac Enzymes: No results found for this basename: CKTOTAL, CKMB, CKMBINDEX, TROPONINI,  in the last 168 hours BNP (last 3 results) No results  found for this basename: PROBNP,  in the last 8760 hours CBG:  Recent Labs Lab 03/12/14 1703 03/13/14 0224 03/13/14 0635 03/13/14 1221  GLUCAP 106* 236* 222* 130*    No results found for this or any previous visit (from the past 240 hour(s)).   Studies: Ct Head (brain) Wo Contrast  03/12/2014   CLINICAL DATA:  Left-sided facial and head numbness, dizziness, and blurred  vision beginning yesterday. Emesis. Stroke symptoms.  EXAM: CT HEAD WITHOUT CONTRAST  TECHNIQUE: Contiguous axial images were obtained from the base of the skull through the vertex without intravenous contrast.  COMPARISON:  Maxillofacial CT 09/19/2012  FINDINGS: Mild age related cerebral volume loss is present. Periventricular white-matter hypodensities are nonspecific but compatible with mild chronic small vessel ischemic disease and grossly similar to prior CT. There is no evidence of acute cortical infarct, intracranial hemorrhage, mass, midline shift, or extra-axial fluid collection. Orbits are unremarkable. Mastoid air cells are clear. There is subtotal opacification of multiple ethmoid air cells bilaterally. Bilateral inferior frontal and left maxillary sinus mucosal thickening is also noted.  IMPRESSION: 1. No evidence of acute intracranial abnormality. 2. Mild chronic small vessel ischemic disease. 3. Moderate paranasal sinus inflammatory mucosal disease.   Electronically Signed   By: Logan Bores   On: 03/12/2014 16:38   Mr Brain Wo Contrast  03/12/2014   CLINICAL DATA:  Dizziness, left facial numbness, blurry vision. Evaluate for stroke.  EXAM: MRI HEAD WITHOUT CONTRAST  TECHNIQUE: Multiplanar, multiecho pulse sequences of the brain and surrounding structures were obtained without intravenous contrast.  COMPARISON:  Prior CT performed earlier on the same day.  FINDINGS: Mild diffuse prominence of the CSF containing spaces is compatible with generalized cerebral atrophy. Scattered and confluent T2/FLAIR hyperintensities within the periventricular and deep white matter both cerebral hemispheres is compatible with moderate chronic small vessel ischemic changes. Small remote lacunar infarct noted within the periventricular white matter of the right corona radiata (series 7, image 18). No mass lesion, midline shift, or extra-axial fluid collection. Ventricles are normal in size without evidence of  hydrocephalus.  There is a subtly increased focus of restricted diffusion measuring approximately 8 mm within the right thalamus (series 5, image 18). There is subtly decreased signal intensity within this region on corresponding ADC map (series 500, image 17). Finding is suspicious for acute/ subacute ischemic infarct. Two additional small foci of restricted diffusion within the posterior limb of the internal capsule are also suspicious for possible infarct (series 5, image 18). No significant mass effect. Few scattered foci of mildly elevated DWI signal intensity within the left basal ganglia noted without definite ADC correlate, which may be related to T2 shine through. Gray-white matter differentiation is maintained. Normal flow voids are seen within the intracranial vasculature. No intracranial hemorrhage identified.  The cervicomedullary junction is normal. Pituitary gland is within normal limits. Pituitary stalk is midline. The globes and optic nerves demonstrate a normal appearance with normal signal intensity.  The bone marrow signal intensity is normal. Calvarium is intact. Visualized upper cervical spine is within normal limits.  Scalp soft tissues are unremarkable.  Mild polypoid mucosal thickening noted within the left maxillary sinus. There is scattered T2 fluid signal intensity within the ethmoidal air cells bilaterally. No mastoid effusion.  IMPRESSION: 1. Subtly increased 8 mm focus of restricted diffusion within the right thalamus, suspicious for acute/subacute ischemic infarct. Additional subcentimeter foci of restricted diffusion within the adjacent posterior limb of the right internal capsule also suspicious for small lacunar  ischemic infarcts. No significant mass effect or intracranial hemorrhage identified. 2. Mild atrophy with moderate chronic microvascular ischemic disease involving the supratentorial white matter. 3. Paranasal sinus disease as above.   Electronically Signed   By: Jeannine Boga M.D.   On: 03/12/2014 23:32    Scheduled Meds: . amitriptyline  50 mg Oral QHS  . amLODipine  10 mg Oral Daily  . aspirin EC  325 mg Oral Daily  . atorvastatin  20 mg Oral Daily  . carvedilol  12.5 mg Oral BID WC  . enoxaparin (LOVENOX) injection  40 mg Subcutaneous Q24H  . febuxostat  40 mg Oral Daily  . ferrous fumarate  1 tablet Oral QODAY  . insulin aspart  0-15 Units Subcutaneous TID WC  . insulin aspart  0-5 Units Subcutaneous QHS  . insulin glargine  40 Units Subcutaneous QHS  . lisinopril  10 mg Oral QPM   Continuous Infusions:   Principal Problem:   CVA (cerebral infarction) Active Problems:   Anemia associated with chronic renal failure   DM (diabetes mellitus)   CKD (chronic kidney disease)   HTN (hypertension)   Gout    Time spent: *25 min    [facility] Pager [phone] If 7PM-7AM, please contact night-coverage at [email or web address], password [facility] 03/13/2014, 2:20 PM  LOS: 1 day

## 2014-03-13 NOTE — Progress Notes (Signed)
  Echocardiogram 2D Echocardiogram has been performed.  Carney Corners 03/13/2014, 4:33 PM

## 2014-03-13 NOTE — H&P (Signed)
[facility] History and Physical  Patient: [name]  DGL:[id number]  DOB: Apr 04, 1946  DOS: the patient was seen and examined on 03/13/2014 PCP: Mayra Neer, MD  Chief Complaint: Blurring of the vision left facial numbness  HPI: FAROOQ PETROVICH is a 68 y.o. male with Past medical history of hypertension, anemia, gout, diabetes, sleep apnea, prostate cancer. The patient presented with complaints of dizziness and blurring of vision. He mentions due before yesterday around 4 PM he started having some dizziness this was followed by blurring of vision and vomiting. He slept over at when he woke up in the morning he found that the vertigo the patient was improved but he continues to have dizziness with difficulty walking around. He started having tingling which was followed by numbness on the left side of the face. With persistent symptoms he went to [facility] and from PCPs office he was sent to the ED. He denies any similar episodes prior. He denies any chest pain, shortness of breath, palpitation, nausea, vomiting, abdominal pain, diarrhea, burning urination, leg swelling. Extent he denies any fall, syncope, trauma.  The patient is coming from home. And at his baseline independent for most of his ADL.  Review of Systems: as mentioned in the history of present illness.  A Comprehensive review of the other systems is negative.  Past Medical History  Diagnosis Date  . Anemia   . Renal insufficiency   . Hypertension   . Gout   . Diabetes mellitus   . Hypercholesterolemia   . Diverticulosis   . Sleep apnea   . History of nonmelanoma skin cancer   . Prostate cancer   . Agent Orange poisoning    Past Surgical History  Procedure Laterality Date  . Total hip arthroplasty    . Mohs surgery     Social History:  reports that he has never smoked. He does not have any smokeless tobacco history on file. He reports that he does not drink alcohol or use illicit drugs.  No Known  Allergies  History reviewed. No pertinent family history.  Prior to Admission medications   Medication Sig Start Date End Date Taking? Authorizing Provider  amitriptyline (ELAVIL) 50 MG tablet Take 50 mg by mouth at bedtime.  10/06/11  Yes Historical Provider, MD  amLODipine (NORVASC) 10 MG tablet Take 10 mg by mouth daily.   Yes Historical Provider, MD  aspirin EC 81 MG tablet Take 81 mg by mouth at bedtime.   Yes Historical Provider, MD  atorvastatin (LIPITOR) 20 MG tablet Take 20 mg by mouth daily.   Yes Historical Provider, MD  carvedilol (COREG) 12.5 MG tablet Take 12.5 mg by mouth 2 (two) times daily with a meal.   Yes Historical Provider, MD  colchicine 0.6 MG tablet Take 0.6 mg by mouth daily as needed (gout).    Yes Historical Provider, MD  febuxostat (ULORIC) 40 MG tablet Take 40 mg by mouth.   Yes Historical Provider, MD  ferrous fumarate (HEMOCYTE - 106 MG FE) 325 (106 FE) MG TABS tablet Take 1 tablet by mouth every other day.   Yes Historical Provider, MD  glipiZIDE (GLUCOTROL XL) 10 MG 24 hr tablet Take 10 mg by mouth 2 (two) times daily.   Yes Historical Provider, MD  HYDROcodone-acetaminophen (NORCO/VICODIN) 5-325 MG per tablet Take 1 tablet by mouth every 6 (six) hours as needed for moderate pain.   Yes Historical Provider, MD  hydrocortisone 2.5 % cream Apply 1 application topically at bedtime.  Yes Historical Provider, MD  insulin glargine (LANTUS) 100 UNIT/ML injection Inject 40 Units into the skin at bedtime.  09/22/12  Yes Adeline C Viyuoh, MD  lisinopril (PRINIVIL,ZESTRIL) 10 MG tablet Take 10 mg by mouth every evening.   Yes Historical Provider, MD  Pyridoxine HCl (VITAMIN B-6 PO) Take 2 tablets by mouth every evening.   Yes Historical Provider, MD  Tetrahydrozoline HCl (EYE DROPS OP) Place 3 drops into both eyes daily as needed (dry eyes).   Yes Historical Provider, MD    Physical Exam: Filed Vitals:   03/12/14 2036 03/12/14 2045 03/13/14 0037 03/13/14 0200  BP:   174/82 173/66 216/87  Pulse:  58 66 71  Temp: 98.2 F (36.8 C)   97.6 F (36.4 C)  TempSrc:    Oral  Resp:  17 17 18   Height:    5\' 10"  (1.778 m)  Weight:      SpO2:  99% 99% 98%    General: Alert, Awake and Oriented to Time, Place and Person. Appear in mild distress Eyes: PERRL ENT: Oral Mucosa clear moist. Neck: no JVD Cardiovascular: S1 and S2 Present, no Murmur, Peripheral Pulses Present Respiratory: Bilateral Air entry equal and Decreased, Clear to Auscultation,  no Crackles,no wheezes Abdomen: Bowel Sound Present, Soft and Non tender Skin: no Rash Extremities: no Pedal edema, no calf tenderness Neurologic: Mild deviation on the left, drop of angle of the mouth left, mental status alert awake and oriented, Cranial Nerves  pupils are reactive, Motor strength  bilaterally equal strength, Sensation  present to light touch, reflexes  normal, babinski  normal, Proprioception  normal, Cerebellar test  normal finger-nose-finger.  Labs on Admission:  CBC:  Recent Labs Lab 03/12/14 1610  WBC 7.8  NEUTROABS 4.2  HGB 13.2  HCT 38.9*  MCV 82.1  PLT 235    CMP     Component Value Date/Time   NA 141 03/12/2014 1610   NA 147* 08/21/2013 1032   K 3.7 03/12/2014 1610   K 3.6 08/21/2013 1032   CL 103 03/12/2014 1610   CL 112* 08/21/2012 1132   CO2 24 03/12/2014 1610   CO2 21* 08/21/2013 1032   GLUCOSE 109* 03/12/2014 1610   GLUCOSE 72 08/21/2013 1032   GLUCOSE 107* 08/21/2012 1132   BUN 13 03/12/2014 1610   BUN 20.0 08/21/2013 1032   CREATININE 1.11 03/12/2014 1610   CREATININE 1.8* 08/21/2013 1032   CALCIUM 9.4 03/12/2014 1610   CALCIUM 9.1 08/21/2013 1032   PROT 8.0 03/12/2014 1610   PROT 6.9 08/21/2013 1032   ALBUMIN 3.9 03/12/2014 1610   ALBUMIN 3.6 08/21/2013 1032   AST 21 03/12/2014 1610   AST 22 08/21/2013 1032   ALT 28 03/12/2014 1610   ALT 26 08/21/2013 1032   ALKPHOS 116 03/12/2014 1610   ALKPHOS 58 08/21/2013 1032   BILITOT 0.3 03/12/2014 1610   BILITOT 0.22 08/21/2013 1032    GFRNONAA 66* 03/12/2014 1610   GFRAA 77* 03/12/2014 1610    No results found for this basename: LIPASE, AMYLASE,  in the last 168 hours No results found for this basename: AMMONIA,  in the last 168 hours  No results found for this basename: CKTOTAL, CKMB, CKMBINDEX, TROPONINI,  in the last 168 hours BNP (last 3 results) No results found for this basename: PROBNP,  in the last 8760 hours  Radiological Exams on Admission: Ct Head (brain) Wo Contrast  03/12/2014   CLINICAL DATA:  Left-sided facial and head numbness, dizziness, and blurred  vision beginning yesterday. Emesis. Stroke symptoms.  EXAM: CT HEAD WITHOUT CONTRAST  TECHNIQUE: Contiguous axial images were obtained from the base of the skull through the vertex without intravenous contrast.  COMPARISON:  Maxillofacial CT 09/19/2012  FINDINGS: Mild age related cerebral volume loss is present. Periventricular white-matter hypodensities are nonspecific but compatible with mild chronic small vessel ischemic disease and grossly similar to prior CT. There is no evidence of acute cortical infarct, intracranial hemorrhage, mass, midline shift, or extra-axial fluid collection. Orbits are unremarkable. Mastoid air cells are clear. There is subtotal opacification of multiple ethmoid air cells bilaterally. Bilateral inferior frontal and left maxillary sinus mucosal thickening is also noted.  IMPRESSION: 1. No evidence of acute intracranial abnormality. 2. Mild chronic small vessel ischemic disease. 3. Moderate paranasal sinus inflammatory mucosal disease.   Electronically Signed   By: Logan Bores   On: 03/12/2014 16:38   Mr Brain Wo Contrast  03/12/2014   CLINICAL DATA:  Dizziness, left facial numbness, blurry vision. Evaluate for stroke.  EXAM: MRI HEAD WITHOUT CONTRAST  TECHNIQUE: Multiplanar, multiecho pulse sequences of the brain and surrounding structures were obtained without intravenous contrast.  COMPARISON:  Prior CT performed earlier on the same day.   FINDINGS: Mild diffuse prominence of the CSF containing spaces is compatible with generalized cerebral atrophy. Scattered and confluent T2/FLAIR hyperintensities within the periventricular and deep white matter both cerebral hemispheres is compatible with moderate chronic small vessel ischemic changes. Small remote lacunar infarct noted within the periventricular white matter of the right corona radiata (series 7, image 18). No mass lesion, midline shift, or extra-axial fluid collection. Ventricles are normal in size without evidence of hydrocephalus.  There is a subtly increased focus of restricted diffusion measuring approximately 8 mm within the right thalamus (series 5, image 18). There is subtly decreased signal intensity within this region on corresponding ADC map (series 500, image 17). Finding is suspicious for acute/ subacute ischemic infarct. Two additional small foci of restricted diffusion within the posterior limb of the internal capsule are also suspicious for possible infarct (series 5, image 18). No significant mass effect. Few scattered foci of mildly elevated DWI signal intensity within the left basal ganglia noted without definite ADC correlate, which may be related to T2 shine through. Gray-white matter differentiation is maintained. Normal flow voids are seen within the intracranial vasculature. No intracranial hemorrhage identified.  The cervicomedullary junction is normal. Pituitary gland is within normal limits. Pituitary stalk is midline. The globes and optic nerves demonstrate a normal appearance with normal signal intensity.  The bone marrow signal intensity is normal. Calvarium is intact. Visualized upper cervical spine is within normal limits.  Scalp soft tissues are unremarkable.  Mild polypoid mucosal thickening noted within the left maxillary sinus. There is scattered T2 fluid signal intensity within the ethmoidal air cells bilaterally. No mastoid effusion.  IMPRESSION: 1. Subtly  increased 8 mm focus of restricted diffusion within the right thalamus, suspicious for acute/subacute ischemic infarct. Additional subcentimeter foci of restricted diffusion within the adjacent posterior limb of the right internal capsule also suspicious for small lacunar ischemic infarcts. No significant mass effect or intracranial hemorrhage identified. 2. Mild atrophy with moderate chronic microvascular ischemic disease involving the supratentorial white matter. 3. Paranasal sinus disease as above.   Electronically Signed   By: Jeannine Boga M.D.   On: 03/12/2014 23:32    EKG: Independently reviewed. normal sinus rhythm, nonspecific ST and T waves changes.  Assessment/Plan Principal Problem:   CVA (cerebral infarction)  Active Problems:   Anemia associated with chronic renal failure   DM (diabetes mellitus)   CKD (chronic kidney disease)   HTN (hypertension)   Gout   1. CVA (cerebral infarction)  the patient is presenting with complaints of ataxia and dizziness and left facial numbness. MRI is positive for right thalamus, posterior limb of right internal capsule stroke. Patient will be started on aspirin 325 per neurology recommendation, MRI brain will be obtained, echocardiogram, lower extremity Doppler, carotid Doppler PTOT and speech consultation Telemetry monitoring  2.Accelerated hypertension Control blood pressure with IV hydralazine and home antihypertensive medications  3. Gout Continue home medications  4. Diabetes Hold oral hypoglycemic continue on sliding scale  Consults: Neurology  DVT Prophylaxis: subcutaneous Heparin Nutrition: Cardiac and diabetic diet  Code Status: Full  Disposition: Admitted to inpatient in telemetry unit.  Author: Berle Mull, MD [facility] Pager: [phone] 03/13/2014, 2:59 AM    If 7PM-7AM, please contact night-coverage [email or web address] Password [email or web address]

## 2014-03-14 LAB — BASIC METABOLIC PANEL
BUN: 16 mg/dL (ref 6–23)
CALCIUM: 8.7 mg/dL (ref 8.4–10.5)
CO2: 23 meq/L (ref 19–32)
CREATININE: 1.06 mg/dL (ref 0.50–1.35)
Chloride: 105 mEq/L (ref 96–112)
GFR calc Af Amer: 81 mL/min — ABNORMAL LOW (ref >90)
GFR calc non Af Amer: 70 mL/min — ABNORMAL LOW (ref >90)
GLUCOSE: 129 mg/dL — AB (ref 70–99)
Potassium: 3.5 mEq/L — ABNORMAL LOW (ref 3.7–5.3)
SODIUM: 142 meq/L (ref 137–147)

## 2014-03-14 LAB — CBC
HEMATOCRIT: 33.2 % — AB (ref 39.0–52.0)
HEMOGLOBIN: 11.1 g/dL — AB (ref 13.0–17.0)
MCH: 27.8 pg (ref 26.0–34.0)
MCHC: 33.4 g/dL (ref 30.0–36.0)
MCV: 83.2 fL (ref 78.0–100.0)
Platelets: 206 10*3/uL (ref 150–400)
RBC: 3.99 MIL/uL — ABNORMAL LOW (ref 4.22–5.81)
RDW: 12.7 % (ref 11.5–15.5)
WBC: 6.6 10*3/uL (ref 4.0–10.5)

## 2014-03-14 LAB — PLATELET FUNCTION ASSAY
COLLAGEN / ADP: 109 s (ref 0–114)
Collagen / Epinephrine: 192 seconds (ref 0–197)

## 2014-03-14 LAB — GLUCOSE, CAPILLARY: GLUCOSE-CAPILLARY: 99 mg/dL (ref 70–99)

## 2014-03-14 MED ORDER — ASPIRIN 325 MG PO TBEC: 325.0000 mg | DELAYED_RELEASE_TABLET | Freq: Every day | ORAL | Status: DC

## 2014-03-14 NOTE — Progress Notes (Signed)
OT Cancellation Note  Patient Details Name: [name] MRN: [id number] DOB: [birth year]   Cancelled Treatment:    Reason Eval/Treat Not Completed: OT screened, no needs identified, will sign off.  PT notified OT that pt has no OT needs.   Benito Mccreedy OTR/L [phone] 03/14/2014, 10:17 AM

## 2014-03-14 NOTE — Progress Notes (Signed)
Patient discharged home with spouse at bedside. Patient and family was informed about follow-up appointment with the neurology in 2 months, to follow-up with PCP, to take medications as ordered and to call [phone] if he notice any symptoms. Patient and family stated understanding of instructions given. Aisha RN

## 2014-03-14 NOTE — Evaluation (Signed)
Physical Therapy Evaluation Patient Details Name: [name] MRN: [id number] DOB: 1946/08/03 Today's Date: 03/14/2014   History of Present Illness  68 y.o. male admitted to [facility] on 03/12/14 with dizziness, blurred vision, N/V, and numbness on the left side of his face.  MRI revealed right thalamic infarct, and small lacunar ischemic infarcts in the posterior limb of the right internal capsule.  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  Pt reports he was active at [facility] with their PT department for balance and leg strengthening and acutally has an appointment tomorrow (03/15/14).    Clinical Impression  Pt with very little residual deficits from the stroke.  Some mild left facial numbness and left gaze stability issues/malalignment of left eye.  Eye exercises given and pt safe to d/c home today.  He would benefit from resuming his OP PT that he was active with [facility].  MD and RN made aware he will need a new prescription to resume.  No OT needs at this time. PT screened for OT.  OT to sign off.   PT to follow acutely for deficits listed below.       Follow Up Recommendations Outpatient PT (resume OP PT at [place])    Equipment Recommendations  None recommended by PT    Recommendations for Other Services   NA    Precautions / Restrictions Precautions Precautions: Fall Precaution Comments: pt with h/o falls, uses a cane at baseline and now with new mild gaze stability issues in left eye.        Mobility       Transfers Overall transfer level: Modified independent Equipment used: Straight cane             General transfer comment: pt using arms and cane to assist with transitions  Ambulation/Gait Ambulation/Gait assistance: Modified independent (Device/Increase time) Ambulation Distance (Feet): 100 Feet Assistive  device: Straight cane Gait Pattern/deviations: Step-through pattern;Staggering right;Staggering left Gait velocity: decreased Gait velocity interpretation: Below normal speed for age/gender General Gait Details: mildly staggering gait pattern even with the cane.  Per pt report his vision is making him feel a "bit odd" on his feet.   Stairs Stairs: Yes Stairs assistance: Modified independent (Device/Increase time) Stair Management: One rail Right;Step to pattern;Forwards;With cane Number of Stairs: 10 General stair comments: with railing and support of the cane he had no difficulty doing the stairs.  He reports since his hip replacement last year he has always done the stairs one at a time.       Modified Rankin (Stroke Patients Only) Modified Rankin (Stroke Patients Only) Pre-Morbid Rankin Score: No significant disability Modified Rankin: No significant disability     Balance Overall balance assessment: Needs assistance Sitting-balance support: Feet supported;No upper extremity supported Sitting balance-Leahy Scale: Normal     Standing balance support: No upper extremity supported;Single extremity supported Standing balance-Leahy Scale: Fair                               Pertinent Vitals/Pain See vitals flow sheet.     Home Living Family/patient expects to be discharged to:: Private residence Living Arrangements: Spouse/significant other Available Help at Discharge: Family;Available 24 hours/day Type of Home: House Home Access: Stairs to enter Entrance Stairs-Rails: None Entrance Stairs-Number of Steps: 3 Home Layout: Two level;Bed/bath  upstairs Home Equipment: Kasandra Knudsen - single point      Prior Function Level of Independence: Independent with assistive device(s)         Comments: uses cane PRN.  Still works part time running his own business.  "I go into the office and handle some of the accounting and finacial things"  Per pt this is just desk work and  does not require any heavy lifting.      Hand Dominance   Dominant Hand: Right    Extremity/Trunk Assessment   Upper Extremity Assessment: Overall WFL for tasks assessed           Lower Extremity Assessment: Overall WFL for tasks assessed      Cervical / Trunk Assessment: Normal  Communication   Communication: HOH;Other (comment) (has bil hearing aids)  Cognition Arousal/Alertness: Awake/alert Behavior During Therapy: WFL for tasks assessed/performed Overall Cognitive Status: Within Functional Limits for tasks assessed                      General Comments General comments (skin integrity, edema, etc.): Pt with decreased left eye gaze stability and decreased symmetry.  Pt give some gaze stability exercises and instructions on how to do/frequencing (I have him do both eyes together, cover one eye, then cover the other eye for both vertical and horizontal tracking).            Assessment/Plan    PT Assessment Patient needs continued PT services  PT Diagnosis Difficulty walking;Abnormality of gait   PT Problem List Decreased balance;Decreased mobility  PT Treatment Interventions DME instruction;Gait training;Stair training;Functional mobility training;Therapeutic activities;Therapeutic exercise;Balance training;Neuromuscular re-education;Patient/family education   PT Goals (Current goals can be found in the Care Plan section) Acute Rehab PT Goals Patient Stated Goal: to go home, go to his therapy tomorrow PT Goal Formulation: With patient Time For Goal Achievement: 03/28/14 Potential to Achieve Goals: Good    Frequency Min 4X/week    End of Session Equipment Utilized During Treatment: Gait belt Activity Tolerance: Patient tolerated treatment well Patient left: in chair;with call bell/phone within reach Nurse Communication: Other (comment) (needs new prescription from MD to resume OP PT )        Time: 4196-2229 PT Time Calculation (min): 29  min   Charges:   PT Evaluation $Initial PT Evaluation Tier I: 1 Procedure PT Treatments $Gait Training: 8-22 mins        Aldean Pipe B. [place], [place], DPT [phone]   03/14/2014, 12:58 PM

## 2014-03-14 NOTE — Progress Notes (Signed)
Stroke Team Progress Note  HISTORY [name] is a 68 y.o. male who reported that on 03/11/2014 he had the acute onset of dizziness. He also noted that he had blurry vision and felt somewhat confused. Felt numbness all over his head. When he ate dinner he vomited. Went to bed that evening and when he awakened his mind was clearer but he continued to be dizzy. Felt his vision was worse in the left eye as compared to the right and was numb on the left side of the face instead of all over his face. Went to PCP on the day of admission, 03/12/2014, at wife's insistence. PCP sent patient to the ED .  Date last known well: Date: 03/11/2014  Time last known well: Time: 16:00  tPA Given: No: Outside time window   SUBJECTIVE No new complaints. Still with left facial numbness and some difficulties with ambulation. Anxious for discharge.  OBJECTIVE Most recent Vital Signs: Filed Vitals:   03/13/14 2123 03/14/14 0000 03/14/14 0217 03/14/14 0546  BP: 135/80  150/69 127/75  Pulse: 57 71 69 68  Temp: 97.8 F (36.6 C)  97.8 F (36.6 C) 97.8 F (36.6 C)  TempSrc: Oral  Oral Oral  Resp: 18  18 18   Height:      Weight:      SpO2: 99%  100% 99%   CBG (last 3)   Recent Labs  03/13/14 1653 03/13/14 2115 03/14/14 0638  GLUCAP 155* 138* 99    IV Fluid Intake:     MEDICATIONS  . amitriptyline  50 mg Oral QHS  . amLODipine  10 mg Oral Daily  . aspirin EC  325 mg Oral Daily  . atorvastatin  20 mg Oral Daily  . carvedilol  12.5 mg Oral BID WC  . enoxaparin (LOVENOX) injection  40 mg Subcutaneous Q24H  . febuxostat  40 mg Oral Daily  . ferrous fumarate  1 tablet Oral QODAY  . insulin aspart  0-15 Units Subcutaneous TID WC  . insulin aspart  0-5 Units Subcutaneous QHS  . insulin glargine  40 Units Subcutaneous QHS  . lisinopril  10 mg Oral QPM   PRN:  colchicine, hydrALAZINE, HYDROcodone-acetaminophen, senna-docusate  Diet:  Carb Control thin liquids Activity:  Up with assistance DVT  Prophylaxis:  Lovenox  CLINICALLY SIGNIFICANT STUDIES Basic Metabolic Panel:   Recent Labs Lab 03/13/14 0810 03/14/14 0528  NA 144 142  K 3.5* 3.5*  CL 106 105  CO2 24 23  GLUCOSE 191* 129*  BUN 15 16  CREATININE 1.07 1.06  CALCIUM 8.7 8.7   Liver Function Tests:   Recent Labs Lab 03/12/14 1610 03/13/14 0810  AST 21 18  ALT 28 22  ALKPHOS 116 104  BILITOT 0.3 0.2*  PROT 8.0 6.5  ALBUMIN 3.9 3.1*   CBC:   Recent Labs Lab 03/12/14 1610 03/13/14 0810 03/14/14 0528  WBC 7.8 7.2 6.6  NEUTROABS 4.2 3.2  --   HGB 13.2 10.7* 11.1*  HCT 38.9* 32.1* 33.2*  MCV 82.1 83.2 83.2  PLT 235 210 206   Coagulation:   Recent Labs Lab 03/12/14 1610  LABPROT 12.0  INR 0.90   Cardiac Enzymes: No results found for this basename: CKTOTAL, CKMB, CKMBINDEX, TROPONINI,  in the last 168 hours Urinalysis: No results found for this basename: COLORURINE, APPERANCEUR, LABSPEC, PHURINE, GLUCOSEU, HGBUR, BILIRUBINUR, KETONESUR, PROTEINUR, UROBILINOGEN, NITRITE, LEUKOCYTESUR,  in the last 168 hours Lipid Panel    Component Value Date/Time   CHOL 150  03/13/2014 0550   TRIG 200* 03/13/2014 0550   HDL 32* 03/13/2014 0550   CHOLHDL 4.7 03/13/2014 0550   VLDL 40 03/13/2014 0550   LDLCALC 78 03/13/2014 0550   HgbA1C  Lab Results  Component Value Date   HGBA1C 9.2* 03/13/2014    Urine Drug Screen:   No results found for this basename: labopia,  cocainscrnur,  labbenz,  amphetmu,  thcu,  labbarb    Alcohol Level: No results found for this basename: ETH,  in the last 168 hours  Ct Head (brain) Wo Contrast 03/12/2014    1. No evidence of acute intracranial abnormality. 2. Mild chronic small vessel ischemic disease. 3. Moderate paranasal sinus inflammatory mucosal disease.    Mr Brain Wo Contrast 03/12/2014    1. Subtly increased 8 mm focus of restricted diffusion within the right thalamus, suspicious for acute/subacute ischemic infarct. Additional subcentimeter foci of restricted diffusion  within the adjacent posterior limb of the right internal capsule also suspicious for small lacunar ischemic infarcts. No significant mass effect or intracranial hemorrhage identified. 2. Mild atrophy with moderate chronic microvascular ischemic disease involving the supratentorial white matter. 3. Paranasal sinus disease as above.     MRA of the brain    2D Echocardiogram  ejection fraction of 45-50%. No cardiac source of emboli identified.  Carotid Doppler Preliminary report: There is 1-39% ICA stenosis. Vertebral artery flow is antegrade   Bilateral lower extremity venous Dopplers  Preliminary report: There is no DVT or SVT noted in the bilateral lower extremities.    CXR    EKG  Sinus rhythm rate 71 beats per minute. For complete results please see formal report.   Therapy Recommendations outpatient physical therapy recommended  Physical Exam   Neurologic Examination:  Mental Status:  Alert, oriented, thought content appropriate. Speech fluent without evidence of aphasia. Able to follow 3 step commands without difficulty.  Cranial Nerves:  II: Discs flat bilaterally; Visual fields grossly normal, pupils equal, round, reactive to light and accommodation  III,IV, VI: ptosis not present, extra-ocular motions intact bilaterally  V,VII: decrease in left NLF, facial light touch sensation decreased on the left  VIII: hearing normal bilaterally  IX,X: gag reflex present  XI: bilateral shoulder shrug  XII: midline tongue extension  Motor:  Right : Upper extremity 5/5 Left: Upper extremity 5/5  Lower extremity 5/5 Lower extremity 5/5  Tone and bulk:normal tone throughout; no atrophy noted  Sensory: Pinprick and light touch intact throughout, bilaterally  Deep Tendon Reflexes: 2+ and symmetric with absent AJ's bilaterally  Plantars:  Right: downgoing Left: downgoing  Cerebellar:  normal finger-to-nose and normal heel-to-shin testing  Gait: Deferred   ASSESSMENT [name]  is a 68 y.o. male presenting with dizziness, left facial numbness, and blurred vision. The patient was outside the window for t-PA therapy.MRI - subtly increased 8 mm focus of restricted diffusion within the right thalamus, suspicious for acute/subacute ischemic infarct. Additional subcentimeter foci of restricted diffusion within the adjacent posterior limb of the right internal capsule also suspicious for small lacunar ischemic infarcts. Infarcts felt to be thrombotic secondary to small vessel disease. On aspirin 81 mg daily prior to admission. Now on aspirin 325 mg orally every day for secondary stroke prevention. Patient with resultant mild residual left facial numbness. Stroke work up underway.   Diabetes mellitus - hemoglobin A1c - 9.2  Hyperlipidemia - cholesterol 150; LDL 78 - on Lipitor prior to admission.  Hypertension  PFA - 192 - consistent with  aspirin therapy  Hospital day # 2  TREATMENT/PLAN  Continue aspirin 325 mg daily for secondary stroke prevention.  Risk factor control  Ready for discharge  Followup Dr. Leonie Man in 2 months   Mikey Bussing PA-C [facility] Pager [phone] 03/14/2014, 8:28 AM  I have personally obtained a history, examined the patient, evaluated imaging results, and formulated the assessment and plan of care. I agree with the above.  D/c from neuro stand point.   To contact Stroke Continuity provider, please refer to [email or web address] After hours, contact General Neurology

## 2014-03-14 NOTE — Discharge Summary (Addendum)
Physician Discharge Summary  [name] [id number] DOB: 1946/01/22 DOA: 03/12/2014  PCP: Mayra Neer, MD  Admit date: 03/12/2014 Discharge date: 03/14/2014  Time spent: 50* minutes  Recommendations for Outpatient Follow-up:  1. Follow up neurology in 2 months   Discharge Diagnoses:  Principal Problem:   CVA (cerebral infarction) Active Problems:   Anemia associated with chronic renal failure   DM (diabetes mellitus)   CKD (chronic kidney disease)   HTN (hypertension)   Gout   Discharge Condition: *Stable  Diet recommendation: Diabetic diet  Filed Weights   03/12/14 1606 03/12/14 1905  Weight: 102.059 kg (225 lb) 104.327 kg (230 lb)    History of present illness:  68 y.o. male with Past medical history of hypertension, anemia, gout, diabetes, sleep apnea, prostate cancer.  The patient presented with complaints of dizziness and blurring of vision. He mentions due before yesterday around 4 PM he started having some dizziness this was followed by blurring of vision and vomiting. He slept over at when he woke up in the morning he found that the vertigo the patient was improved but he continues to have dizziness with difficulty walking around. He started having tingling which was followed by numbness on the left side of the face. With persistent symptoms he went to [facility] and from PCPs office he was sent to the ED.  He denies any similar episodes prior. He denies any chest pain, shortness of breath, palpitation, nausea, vomiting, abdominal pain, diarrhea, burning urination, leg swelling. Extent he denies any fall, syncope, trauma.   Hospital Course:  Acute/subacute thalamic infarct- MRI shows subcentimeter foci of restricted diffusion within the adjacent posterior limb of the right internal capsule also suspicious for small lacunar ischemic infarcts. Infarcts felt to be thrombotic secondary to small vessel disease  Patient started on aspirin 325 mg po daily. Carotid  doppler shows 1-39% ICA stenosis. Vertebral artery flow is antegrade. 2d echo showed no source of emboli.  Anemia- Hb improved to 11.7, no stoll collected for occult blood. Will follow up PCP as outpatient for further w/u of anemia.  Hypertension- Continue Coreg 12.5 mg po BID, Lisinopril 10 mg po daily.   Diabetes mellitus- Continue Glipizide 10 mg po daily. Hb a1c is elevated to 9.2, he wants to discuss with PCP before starting metformin.  Hyperlipidemia- Continue with Atorvastatin.   Procedures: carotid doppler Echocardiogram  Consultations:  Neurology  Discharge Exam: Filed Vitals:   03/14/14 0546  BP: 127/75  Pulse: 68  Temp: 97.8 F (36.6 C)  Resp: 18    General: Appear in no acute distress Cardiovascular: S1S2 RRR Respiratory: Clear bilaterally  Discharge Instructions You were cared for by a hospitalist during your hospital stay. If you have any questions about your discharge medications or the care you received while you were in the hospital after you are discharged, you can call the unit and asked to speak with the hospitalist on call if the hospitalist that took care of you is not available. Once you are discharged, your primary care physician will handle any further medical issues. Please note that NO REFILLS for any discharge medications will be authorized once you are discharged, as it is imperative that you return to your primary care physician (or establish a relationship with a primary care physician if you do not have one) for your aftercare needs so that they can reassess your need for medications and monitor your lab values.  Discharge Orders   Future Orders Complete By Expires   Diet -  low sodium heart healthy  As directed    Discharge instructions  As directed    Increase activity slowly  As directed        Medication List         amitriptyline 50 MG tablet  Commonly known as:  ELAVIL  Take 50 mg by mouth at bedtime.     amLODipine 10 MG tablet   Commonly known as:  NORVASC  Take 10 mg by mouth daily.     aspirin 325 MG EC tablet  Take 1 tablet (325 mg total) by mouth daily.     atorvastatin 20 MG tablet  Commonly known as:  LIPITOR  Take 20 mg by mouth daily.     carvedilol 12.5 MG tablet  Commonly known as:  COREG  Take 12.5 mg by mouth 2 (two) times daily with a meal.     colchicine 0.6 MG tablet  Take 0.6 mg by mouth daily as needed (gout).     EYE DROPS OP  Place 3 drops into both eyes daily as needed (dry eyes).     ferrous fumarate 325 (106 FE) MG Tabs tablet  Commonly known as:  HEMOCYTE - 106 mg FE  Take 1 tablet by mouth every other day.     glipiZIDE 10 MG 24 hr tablet  Commonly known as:  GLUCOTROL XL  Take 10 mg by mouth 2 (two) times daily.     HYDROcodone-acetaminophen 5-325 MG per tablet  Commonly known as:  NORCO/VICODIN  Take 1 tablet by mouth every 6 (six) hours as needed for moderate pain.     hydrocortisone 2.5 % cream  Apply 1 application topically at bedtime.     insulin glargine 100 UNIT/ML injection  Commonly known as:  LANTUS  Inject 40 Units into the skin at bedtime.     lisinopril 10 MG tablet  Commonly known as:  PRINIVIL,ZESTRIL  Take 10 mg by mouth every evening.     ULORIC 40 MG tablet  Generic drug:  febuxostat  Take 40 mg by mouth.     VITAMIN B-6 PO  Take 2 tablets by mouth every evening.       No Known Allergies     Follow-up Information   Follow up with Forbes Cellar, MD. Schedule an appointment as soon as possible for a visit in 2 months.   Specialties:  Neurology, Radiology   Contact information:   [address] [place] [address] [phone]        The results of significant diagnostics from this hospitalization (including imaging, microbiology, ancillary and laboratory) are listed below for reference.    Significant Diagnostic Studies: Ct Head (brain) Wo Contrast  03/12/2014   CLINICAL DATA:  Left-sided facial and head  numbness, dizziness, and blurred vision beginning yesterday. Emesis. Stroke symptoms.  EXAM: CT HEAD WITHOUT CONTRAST  TECHNIQUE: Contiguous axial images were obtained from the base of the skull through the vertex without intravenous contrast.  COMPARISON:  Maxillofacial CT 09/19/2012  FINDINGS: Mild age related cerebral volume loss is present. Periventricular white-matter hypodensities are nonspecific but compatible with mild chronic small vessel ischemic disease and grossly similar to prior CT. There is no evidence of acute cortical infarct, intracranial hemorrhage, mass, midline shift, or extra-axial fluid collection. Orbits are unremarkable. Mastoid air cells are clear. There is subtotal opacification of multiple ethmoid air cells bilaterally. Bilateral inferior frontal and left maxillary sinus mucosal thickening is also noted.  IMPRESSION: 1. No evidence of acute intracranial  abnormality. 2. Mild chronic small vessel ischemic disease. 3. Moderate paranasal sinus inflammatory mucosal disease.   Electronically Signed   By: Logan Bores   On: 03/12/2014 16:38   Mr Brain Wo Contrast  03/12/2014   CLINICAL DATA:  Dizziness, left facial numbness, blurry vision. Evaluate for stroke.  EXAM: MRI HEAD WITHOUT CONTRAST  TECHNIQUE: Multiplanar, multiecho pulse sequences of the brain and surrounding structures were obtained without intravenous contrast.  COMPARISON:  Prior CT performed earlier on the same day.  FINDINGS: Mild diffuse prominence of the CSF containing spaces is compatible with generalized cerebral atrophy. Scattered and confluent T2/FLAIR hyperintensities within the periventricular and deep white matter both cerebral hemispheres is compatible with moderate chronic small vessel ischemic changes. Small remote lacunar infarct noted within the periventricular white matter of the right corona radiata (series 7, image 18). No mass lesion, midline shift, or extra-axial fluid collection. Ventricles are normal in  size without evidence of hydrocephalus.  There is a subtly increased focus of restricted diffusion measuring approximately 8 mm within the right thalamus (series 5, image 18). There is subtly decreased signal intensity within this region on corresponding ADC map (series 500, image 17). Finding is suspicious for acute/ subacute ischemic infarct. Two additional small foci of restricted diffusion within the posterior limb of the internal capsule are also suspicious for possible infarct (series 5, image 18). No significant mass effect. Few scattered foci of mildly elevated DWI signal intensity within the left basal ganglia noted without definite ADC correlate, which may be related to T2 shine through. Gray-white matter differentiation is maintained. Normal flow voids are seen within the intracranial vasculature. No intracranial hemorrhage identified.  The cervicomedullary junction is normal. Pituitary gland is within normal limits. Pituitary stalk is midline. The globes and optic nerves demonstrate a normal appearance with normal signal intensity.  The bone marrow signal intensity is normal. Calvarium is intact. Visualized upper cervical spine is within normal limits.  Scalp soft tissues are unremarkable.  Mild polypoid mucosal thickening noted within the left maxillary sinus. There is scattered T2 fluid signal intensity within the ethmoidal air cells bilaterally. No mastoid effusion.  IMPRESSION: 1. Subtly increased 8 mm focus of restricted diffusion within the right thalamus, suspicious for acute/subacute ischemic infarct. Additional subcentimeter foci of restricted diffusion within the adjacent posterior limb of the right internal capsule also suspicious for small lacunar ischemic infarcts. No significant mass effect or intracranial hemorrhage identified. 2. Mild atrophy with moderate chronic microvascular ischemic disease involving the supratentorial white matter. 3. Paranasal sinus disease as above.    Electronically Signed   By: Jeannine Boga M.D.   On: 03/12/2014 23:32    Microbiology: No results found for this or any previous visit (from the past 240 hour(s)).   Labs: Basic Metabolic Panel:  Recent Labs Lab 03/12/14 1610 03/13/14 0810 03/14/14 0528  NA 141 144 142  K 3.7 3.5* 3.5*  CL 103 106 105  CO2 24 24 23   GLUCOSE 109* 191* 129*  BUN 13 15 16   CREATININE 1.11 1.07 1.06  CALCIUM 9.4 8.7 8.7   Liver Function Tests:  Recent Labs Lab 03/12/14 1610 03/13/14 0810  AST 21 18  ALT 28 22  ALKPHOS 116 104  BILITOT 0.3 0.2*  PROT 8.0 6.5  ALBUMIN 3.9 3.1*   No results found for this basename: LIPASE, AMYLASE,  in the last 168 hours No results found for this basename: AMMONIA,  in the last 168 hours CBC:  Recent Labs Lab 03/12/14  1610 03/13/14 0810 03/14/14 0528  WBC 7.8 7.2 6.6  NEUTROABS 4.2 3.2  --   HGB 13.2 10.7* 11.1*  HCT 38.9* 32.1* 33.2*  MCV 82.1 83.2 83.2  PLT 235 210 206   Cardiac Enzymes: No results found for this basename: CKTOTAL, CKMB, CKMBINDEX, TROPONINI,  in the last 168 hours BNP: BNP (last 3 results) No results found for this basename: PROBNP,  in the last 8760 hours CBG:  Recent Labs Lab 03/13/14 0635 03/13/14 1221 03/13/14 1653 03/13/14 2115 03/14/14 0638  GLUCAP 222* 130* 155* 138* 99       Signed:  [place]  [facility] 03/14/2014, 12:35 PM

## 2014-03-15 LAB — GLUCOSE, CAPILLARY: GLUCOSE-CAPILLARY: 169 mg/dL — AB (ref 70–99)

## 2014-05-20 ENCOUNTER — Ambulatory Visit: Payer: Medicare Other | Admitting: Neurology

## 2014-05-26 ENCOUNTER — Other Ambulatory Visit: Payer: Medicare Other

## 2014-08-03 ENCOUNTER — Ambulatory Visit (INDEPENDENT_AMBULATORY_CARE_PROVIDER_SITE_OTHER): Payer: Medicare Other | Admitting: Cardiology

## 2014-08-03 ENCOUNTER — Encounter (INDEPENDENT_AMBULATORY_CARE_PROVIDER_SITE_OTHER): Payer: Medicare Other

## 2014-08-03 ENCOUNTER — Encounter: Payer: Self-pay | Admitting: *Deleted

## 2014-08-03 ENCOUNTER — Encounter: Payer: Self-pay | Admitting: Cardiology

## 2014-08-03 VITALS — BP 132/78 | HR 88 | Ht 70.0 in | Wt 229.0 lb

## 2014-08-03 DIAGNOSIS — R42 Dizziness and giddiness: Secondary | ICD-10-CM

## 2014-08-03 DIAGNOSIS — I6350 Cerebral infarction due to unspecified occlusion or stenosis of unspecified cerebral artery: Secondary | ICD-10-CM

## 2014-08-03 DIAGNOSIS — I639 Cerebral infarction, unspecified: Secondary | ICD-10-CM

## 2014-08-03 DIAGNOSIS — IMO0001 Reserved for inherently not codable concepts without codable children: Secondary | ICD-10-CM

## 2014-08-03 DIAGNOSIS — I119 Hypertensive heart disease without heart failure: Secondary | ICD-10-CM

## 2014-08-03 DIAGNOSIS — E1165 Type 2 diabetes mellitus with hyperglycemia: Secondary | ICD-10-CM

## 2014-08-03 NOTE — Progress Notes (Signed)
[name] Date of Birth:  [birth year] [facility] [address] [place], [place]  [address] [phone]        Fax   [phone]   History of Present Illness: This pleasant 68 year old gentleman is seen at the request of Dr. Marylene Land of the [facility].  The patient is seen for evaluation of possible cryptogenic stroke.  He was admitted on 03/12/14 and discharged on 03/14/14.  He was admitted with dizziness and blurred vision.  He had a neurologic workup in the hospital which showed several small infarcts felt to be thrombotic secondary to small vessel disease.  He had a two-dimensional echocardiogram during the hospital stay which did not show any source of emboli.  The echocardiogram showed left ventricular systolic function slightly depressed with an ejection fraction of 45-50% and there was grade 1 diastolic dysfunction.  The patient had carotid Dopplers which did not show any evidence of significant stenosis . Following discharge he had a 48 hour Holter monitor which did not show any evidence of arrhythmia.  [facility] have referred him here for consideration of a 30 day event monitor.  The patient has not been aware of any racing or skipping of his heart.  He has not had any history of ischemic heart disease or angina pectoris.  He has been a diabetic since 1995.  He has a long history of hypertension.  He has had a history of chronic anemia and has been evaluated by hematology at the [facility].  He has also been exposed to [place] in the [place] War.  Current Outpatient Prescriptions  Medication Sig Dispense Refill  . amitriptyline (ELAVIL) 50 MG tablet Take 50 mg by mouth at bedtime.       Marland Kitchen amLODipine (NORVASC) 10 MG tablet Take 10 mg by mouth daily.      Marland Kitchen aspirin EC 325 MG EC tablet Take 1 tablet (325 mg total) by mouth daily.  30 tablet  2  . atorvastatin (LIPITOR) 20 MG tablet Take 20 mg by mouth  daily.      . carvedilol (COREG) 12.5 MG tablet Take 12.5 mg by mouth 2 (two) times daily with a meal.      . colchicine 0.6 MG tablet Take 0.6 mg by mouth daily as needed (gout).       . febuxostat (ULORIC) 40 MG tablet Take 40 mg by mouth.      . ferrous fumarate (HEMOCYTE - 106 MG FE) 325 (106 FE) MG TABS tablet Take 1 tablet by mouth every other day.      Marland Kitchen glipiZIDE (GLUCOTROL XL) 10 MG 24 hr tablet Take 10 mg by mouth 2 (two) times daily.      Marland Kitchen HYDROcodone-acetaminophen (NORCO/VICODIN) 5-325 MG per tablet Take 1 tablet by mouth every 6 (six) hours as needed for moderate pain.      . hydrocortisone 2.5 % cream Apply 1 application topically at bedtime.      . insulin glargine (LANTUS) 100 UNIT/ML injection Inject 50 Units into the skin at bedtime.       Marland Kitchen lisinopril (PRINIVIL,ZESTRIL) 10 MG tablet Take 10 mg by mouth every evening.      . Pyridoxine HCl (VITAMIN B-6 PO) Take 2 tablets by mouth every evening.      . Tetrahydrozoline HCl (EYE DROPS OP) Place 3 drops into both eyes daily as needed (dry eyes).       No  current facility-administered medications for this visit.    No Known Allergies  Patient Active Problem List   Diagnosis Date Noted  . Cryptogenic stroke 08/03/2014  . Gout 03/13/2014  . CVA (cerebral infarction) 03/12/2014  . Fever 09/18/2012  . DM (diabetes mellitus) 09/18/2012  . CKD (chronic kidney disease) 09/18/2012  . Anemia 09/18/2012  . HTN (hypertension) 09/18/2012  . Hyperlipidemia 09/18/2012  . Anemia associated with chronic renal failure 10/18/2011    History  Smoking status  . Never Smoker   Smokeless tobacco  . Not on file    History  Alcohol Use No    No family history on file.  Review of Systems: Constitutional: no fever chills diaphoresis or fatigue or change in weight.  Head and neck: no hearing loss, no epistaxis, no photophobia or visual disturbance. Respiratory: No cough, shortness of breath or wheezing. Cardiovascular: No chest  pain peripheral edema, palpitations. Gastrointestinal: No abdominal distention, no abdominal pain, no change in bowel habits hematochezia or melena. Genitourinary: No dysuria, no frequency, no urgency, no nocturia. Musculoskeletal:No arthralgias, no back pain, no gait disturbance or myalgias. Neurological: No dizziness, no headaches, no numbness, no seizures, no syncope, no weakness, no tremors. Hematologic: No lymphadenopathy, no easy bruising. Psychiatric: No confusion, no hallucinations, no sleep disturbance.    Physical Exam: Filed Vitals:   08/03/14 1445  BP: 132/78  Pulse: 88  The patient appears to be in no distress.  Head and neck exam reveals that the pupils are equal and reactive.  The extraocular movements are full.  There is no scleral icterus.  Mouth and pharynx are benign.  No lymphadenopathy.  No carotid bruits.  The jugular venous pressure is normal.  Thyroid is not enlarged or tender.  Chest is clear to percussion and auscultation.  No rales or rhonchi.  Expansion of the chest is symmetrical.  Heart reveals no abnormal lift or heave.  First and second heart sounds are normal.  There is no murmur gallop rub or click.  The abdomen is soft and nontender.  Bowel sounds are normoactive.  There is no hepatosplenomegaly or mass.  There are no abdominal bruits.  Extremities reveal no phlebitis or edema.  Pedal pulses are good.  There is no cyanosis or clubbing.  Neurologic exam is normal strength and no lateralizing weakness.  No sensory deficits.  Integument reveals no rash  EKG shows normal sinus rhythm and nonspecific interventricular conduction delay  Assessment / Plan: 1.  Cryptogenic stroke 2. dizziness, resolved 3. hypertensive cardiovascular disease 4. diabetes mellitus adult onset diabetic neuropathy 5. history of prior exposure to agent orange in the [place] War  Disposition: We will arrange for the patient to have a 30 day event monitor to rule out atrial  fibrillation as a cause of his cryptogenic stroke.  At the present time he is on daily aspirin. Many thanks for the opportunity to see this pleasant gentleman with you.

## 2014-08-03 NOTE — Patient Instructions (Addendum)
Your physician recommends that you continue on your current medications as directed. Please refer to the Current Medication list given to you today.  Your physician has recommended that you wear an event monitor. Event monitors are medical devices that record the heart's electrical activity. Doctors most often [place] these monitors to diagnose arrhythmias. Arrhythmias are problems with the speed or rhythm of the heartbeat. The monitor is a small, portable device. You can wear one while you do your normal daily activities. This is usually used to diagnose what is causing palpitations/syncope (passing out). 30 day monitor

## 2014-08-03 NOTE — Progress Notes (Signed)
Patient ID: [name], male   DOB: [birth year], 68 y.o.   MRN: [id number] E-Cardio verite 30 day cardiac event monitor applied to patient.

## 2014-08-25 ENCOUNTER — Other Ambulatory Visit: Payer: Medicare Other

## 2014-08-25 ENCOUNTER — Ambulatory Visit: Payer: Medicare Other

## 2014-09-17 ENCOUNTER — Telehealth: Payer: Self-pay | Admitting: *Deleted

## 2014-09-17 NOTE — Telephone Encounter (Signed)
Event monitor reviewed by  Dr. Mare Ferrari: Interpretation: sinus rhythm Advised patient

## 2014-11-01 ENCOUNTER — Emergency Department (HOSPITAL_COMMUNITY): Payer: Non-veteran care

## 2014-11-01 ENCOUNTER — Emergency Department (HOSPITAL_COMMUNITY)
Admission: EM | Admit: 2014-11-01 | Discharge: 2014-11-01 | Disposition: A | Discharge status: Home / Self Care | Payer: Non-veteran care | Attending: Emergency Medicine | Admitting: Emergency Medicine

## 2014-11-01 ENCOUNTER — Encounter (HOSPITAL_COMMUNITY): Payer: Self-pay | Admitting: Emergency Medicine

## 2014-11-01 DIAGNOSIS — E780 Pure hypercholesterolemia: Secondary | ICD-10-CM | POA: Insufficient documentation

## 2014-11-01 DIAGNOSIS — Z794 Long term (current) use of insulin: Secondary | ICD-10-CM | POA: Insufficient documentation

## 2014-11-01 DIAGNOSIS — Z85828 Personal history of other malignant neoplasm of skin: Secondary | ICD-10-CM | POA: Insufficient documentation

## 2014-11-01 DIAGNOSIS — R05 Cough: Secondary | ICD-10-CM

## 2014-11-01 DIAGNOSIS — E119 Type 2 diabetes mellitus without complications: Secondary | ICD-10-CM | POA: Insufficient documentation

## 2014-11-01 DIAGNOSIS — Z8546 Personal history of malignant neoplasm of prostate: Secondary | ICD-10-CM | POA: Insufficient documentation

## 2014-11-01 DIAGNOSIS — R5383 Other fatigue: Secondary | ICD-10-CM | POA: Insufficient documentation

## 2014-11-01 DIAGNOSIS — R110 Nausea: Secondary | ICD-10-CM | POA: Insufficient documentation

## 2014-11-01 DIAGNOSIS — R0981 Nasal congestion: Secondary | ICD-10-CM | POA: Insufficient documentation

## 2014-11-01 DIAGNOSIS — R0602 Shortness of breath: Secondary | ICD-10-CM | POA: Insufficient documentation

## 2014-11-01 DIAGNOSIS — I10 Essential (primary) hypertension: Secondary | ICD-10-CM | POA: Insufficient documentation

## 2014-11-01 DIAGNOSIS — Z87828 Personal history of other (healed) physical injury and trauma: Secondary | ICD-10-CM | POA: Insufficient documentation

## 2014-11-01 DIAGNOSIS — R509 Fever, unspecified: Secondary | ICD-10-CM | POA: Insufficient documentation

## 2014-11-01 DIAGNOSIS — D649 Anemia, unspecified: Secondary | ICD-10-CM | POA: Insufficient documentation

## 2014-11-01 DIAGNOSIS — Z87448 Personal history of other diseases of urinary system: Secondary | ICD-10-CM | POA: Insufficient documentation

## 2014-11-01 DIAGNOSIS — M109 Gout, unspecified: Secondary | ICD-10-CM | POA: Insufficient documentation

## 2014-11-01 DIAGNOSIS — Z7982 Long term (current) use of aspirin: Secondary | ICD-10-CM | POA: Insufficient documentation

## 2014-11-01 DIAGNOSIS — R058 Other specified cough: Secondary | ICD-10-CM

## 2014-11-01 DIAGNOSIS — Z79899 Other long term (current) drug therapy: Secondary | ICD-10-CM | POA: Insufficient documentation

## 2014-11-01 DIAGNOSIS — Z8719 Personal history of other diseases of the digestive system: Secondary | ICD-10-CM | POA: Insufficient documentation

## 2014-11-01 DIAGNOSIS — R630 Anorexia: Secondary | ICD-10-CM | POA: Insufficient documentation

## 2014-11-01 DIAGNOSIS — R55 Syncope and collapse: Secondary | ICD-10-CM | POA: Insufficient documentation

## 2014-11-01 LAB — BASIC METABOLIC PANEL
Anion gap: 16 — ABNORMAL HIGH (ref 5–15)
BUN: 22 mg/dL (ref 6–23)
CO2: 22 mEq/L (ref 19–32)
Calcium: 9.3 mg/dL (ref 8.4–10.5)
Chloride: 99 mEq/L (ref 96–112)
Creatinine, Ser: 1.29 mg/dL (ref 0.50–1.35)
GFR calc Af Amer: 64 mL/min — ABNORMAL LOW (ref >90)
GFR calc non Af Amer: 55 mL/min — ABNORMAL LOW (ref >90)
Glucose, Bld: 163 mg/dL — ABNORMAL HIGH (ref 70–99)
Potassium: 3.3 mEq/L — ABNORMAL LOW (ref 3.7–5.3)
Sodium: 137 mEq/L (ref 137–147)

## 2014-11-01 LAB — I-STAT CG4 LACTIC ACID, ED: Lactic Acid, Venous: 1.05 mmol/L (ref 0.5–2.2)

## 2014-11-01 LAB — CBC WITH DIFFERENTIAL/PLATELET
Basophils Absolute: 0 10*3/uL (ref 0.0–0.1)
Basophils Relative: 0 % (ref 0–1)
Eosinophils Absolute: 0.4 10*3/uL (ref 0.0–0.7)
Eosinophils Relative: 4 % (ref 0–5)
HCT: 32.4 % — ABNORMAL LOW (ref 39.0–52.0)
Hemoglobin: 10.8 g/dL — ABNORMAL LOW (ref 13.0–17.0)
Lymphocytes Relative: 35 % (ref 12–46)
Lymphs Abs: 3.2 10*3/uL (ref 0.7–4.0)
MCH: 26.9 pg (ref 26.0–34.0)
MCHC: 33.3 g/dL (ref 30.0–36.0)
MCV: 80.8 fL (ref 78.0–100.0)
Monocytes Absolute: 0.7 10*3/uL (ref 0.1–1.0)
Monocytes Relative: 8 % (ref 3–12)
Neutro Abs: 4.8 10*3/uL (ref 1.7–7.7)
Neutrophils Relative %: 53 % (ref 43–77)
Platelets: 206 10*3/uL (ref 150–400)
RBC: 4.01 MIL/uL — ABNORMAL LOW (ref 4.22–5.81)
RDW: 13.1 % (ref 11.5–15.5)
WBC: 9.2 10*3/uL (ref 4.0–10.5)

## 2014-11-01 MED ORDER — ACETAMINOPHEN 325 MG PO TABS
650.0000 mg | ORAL_TABLET | Freq: Once | ORAL | Status: AC
  Administered 2014-11-01: 650 mg via ORAL
  Filled 2014-11-01: qty 2

## 2014-11-01 NOTE — Discharge Instructions (Signed)
Cool Mist Vaporizers °Vaporizers may help relieve the symptoms of a cough and cold. They add moisture to the air, which helps mucus to become thinner and less sticky. This makes it easier to breathe and cough up secretions. Cool mist vaporizers do not cause serious burns like hot mist vaporizers, which may also be called steamers or humidifiers. Vaporizers have not been proven to help with colds. You should not use a vaporizer if you are allergic to mold. °HOME CARE INSTRUCTIONS °· Follow the package instructions for the vaporizer. °· Do not use anything other than distilled water in the vaporizer. °· Do not run the vaporizer all of the time. This can cause mold or bacteria to grow in the vaporizer. °· Clean the vaporizer after each time it is used. °· Clean and dry the vaporizer well before storing it. °· Stop using the vaporizer if worsening respiratory symptoms develop. °Document Released: 08/02/2004 Document Revised: 11/10/2013 Document Reviewed: 03/25/2013 °ExitCare® Patient Information ©2015 [employer]. This information is not intended to replace advice given to you by your health care provider. Make sure you discuss any questions you have with your health care provider. ° °

## 2014-11-01 NOTE — ED Provider Notes (Signed)
Medical screening examination/treatment/procedure(s) were conducted as a shared visit with non-physician practitioner(s) and myself.  I personally evaluated the patient during the encounter.   EKG Interpretation   Date/Time:  Monday November 01 2014 17:20:57 EST Ventricular Rate:  96 PR Interval:  170 QRS Duration: 116 QT Interval:  378 QTC Calculation: 477 R Axis:   -49 Text Interpretation:  Normal sinus rhythm Left anterior fascicular block  Left ventricular hypertrophy with QRS widening Cannot rule out Septal  infarct , age undetermined Abnormal ECG No significant change since last  tracing Confirmed by Anora Schwenke,  DO, Yves Fodor [phone]) on 11/01/2014 8:16:36 PM      Pt is a 68 y.o. M with h/o diabetes, hyperlipidemia who presents to the emergency department with several days of cough, nasal congestion. He has been recently on Augmentin and then started on azithromycin. States he is feeling much better. He states he did cough so hard yesterday that he felt like he may pass out secondary to coughing so hard. Denies any chest pain. Denies any fever. States he is feeling great today. No shortness of breath. States he had a chest x-ray today at urgent care who is concerned he could have pneumonia and sent him to the ED. On exam, patient had a temperature of 100 is no longer tachycardic. Lungs are clear. No increased work of breathing, hypoxia. Chest x-ray here is unremarkable. Labs unremarkable. Patient reports that he would not be here in the emergency department if he was not instructed to, or you he states he will finish his last dose of azithromycin tomorrow. I do not feel he needs to have an extended antibiotic course or be hospitalized at this time. Discussed return precautions.  [place], DO 11/01/14 2322

## 2014-11-01 NOTE — ED Provider Notes (Signed)
CSN: [id number]     Arrival date & time 11/01/14  1712 History   First MD Initiated Contact with Patient 11/01/14 1842     Chief Complaint  Patient presents with  . Pneumonia     (Consider location/radiation/quality/duration/timing/severity/associated sxs/prior Treatment) HPI  OPt is a 67yo male presenting to ED with c/o cough and mild SOB on exertion with productive cough, symptoms initially started 11/11, pt was started "on a pink pill, antibiotic" for 10 days at that time, initially improved but then went on a cruise on 11/14, and got worse.  Reports f/u with his PCP on 10/26/14, and started on augmentin for a suspected sinusitis.  Pt was not feeling better so he went to urgent care on Friday, 12/11, started on azithromycin.  Yesterday, pt had 2 coughing fits where he coughed so he passed out due to prolonged coughing. Per wife, he did lose consciousness for "just a few seconds"  Pt states he feels better today as he has not had any additional syncopal episodes but wife called Urgent Care who mentioned the pneumonia on the CXR and was advised to come to the ED for further evaluation and treatment of pneumonia.  Reports decreased appetite and intermittent fever, Tmax 102, improves with acetaminophen.  Pt states he still has 1 pill of the azithromycin as well as some of the augmentin left.  No hx of asthma or COPD.  Denies smoking.    Past Medical History  Diagnosis Date  . Anemia   . Renal insufficiency   . Hypertension   . Gout   . Diabetes mellitus   . Hypercholesterolemia   . Diverticulosis   . Sleep apnea   . History of nonmelanoma skin cancer   . Prostate cancer   . Agent Orange poisoning    Past Surgical History  Procedure Laterality Date  . Total hip arthroplasty    . Mohs surgery     No family history on file. History  Substance Use Topics  . Smoking status: Never Smoker   . Smokeless tobacco: Not on file  . Alcohol Use: No    Review of Systems  Constitutional:  Positive for fever, chills, appetite change and fatigue.  HENT: Positive for congestion.   Respiratory: Positive for cough and shortness of breath.   Cardiovascular: Negative for chest pain and palpitations.  Gastrointestinal: Positive for nausea. Negative for vomiting, abdominal pain and diarrhea.  Neurological: Positive for syncope. Negative for dizziness, tremors, seizures, light-headedness and headaches.  All other systems reviewed and are negative.     Allergies  Review of patient's allergies indicates no known allergies.  Home Medications   Prior to Admission medications   Medication Sig Start Date End Date Taking? Authorizing Provider  amitriptyline (ELAVIL) 50 MG tablet Take 50 mg by mouth at bedtime.  10/06/11   Historical Provider, MD  amLODipine (NORVASC) 10 MG tablet Take 10 mg by mouth daily.    Historical Provider, MD  aspirin EC 325 MG EC tablet Take 1 tablet (325 mg total) by mouth daily. 03/14/14   Oswald Hillock, MD  atorvastatin (LIPITOR) 20 MG tablet Take 20 mg by mouth daily.    Historical Provider, MD  carvedilol (COREG) 12.5 MG tablet Take 12.5 mg by mouth 2 (two) times daily with a meal.    Historical Provider, MD  colchicine 0.6 MG tablet Take 0.6 mg by mouth daily as needed (gout).     Historical Provider, MD  febuxostat (ULORIC) 40 MG tablet Take 40  mg by mouth.    Historical Provider, MD  ferrous fumarate (HEMOCYTE - 106 MG FE) 325 (106 FE) MG TABS tablet Take 1 tablet by mouth every other day.    Historical Provider, MD  glipiZIDE (GLUCOTROL XL) 10 MG 24 hr tablet Take 10 mg by mouth 2 (two) times daily.    Historical Provider, MD  HYDROcodone-acetaminophen (NORCO/VICODIN) 5-325 MG per tablet Take 1 tablet by mouth every 6 (six) hours as needed for moderate pain.    Historical Provider, MD  hydrocortisone 2.5 % cream Apply 1 application topically at bedtime.    Historical Provider, MD  insulin glargine (LANTUS) 100 UNIT/ML injection Inject 50 Units into the skin  at bedtime.  09/22/12   Sheila Oats, MD  lisinopril (PRINIVIL,ZESTRIL) 10 MG tablet Take 10 mg by mouth every evening.    Historical Provider, MD  Pyridoxine HCl (VITAMIN B-6 PO) Take 2 tablets by mouth every evening.    Historical Provider, MD  Tetrahydrozoline HCl (EYE DROPS OP) Place 3 drops into both eyes daily as needed (dry eyes).    Historical Provider, MD   BP 140/68 mmHg  Pulse 85  Temp(Src) 98.6 F (37 C) (Oral)  Resp 20  SpO2 97% Physical Exam  Constitutional: He appears well-developed and well-nourished. No distress.  Pt lying comfortably in exam bed, NAD.   HENT:  Head: Normocephalic and atraumatic.  Eyes: Conjunctivae are normal. No scleral icterus.  Neck: Normal range of motion.  Cardiovascular: Normal rate, regular rhythm and normal heart sounds.   Pulmonary/Chest: Effort normal. No respiratory distress. He has decreased breath sounds in the right lower field and the left lower field. He has no wheezes. He has no rales. He exhibits no tenderness.  Abdominal: Soft. Bowel sounds are normal. He exhibits no distension and no mass. There is no tenderness. There is no rebound and no guarding.  Musculoskeletal: Normal range of motion.  Neurological: He is alert.  Skin: Skin is warm and dry. He is not diaphoretic.  Nursing note and vitals reviewed.   ED Course  Procedures (including critical care time) Labs Review Labs Reviewed  CBC WITH DIFFERENTIAL - Abnormal; Notable for the following:    RBC 4.01 (*)    Hemoglobin 10.8 (*)    HCT 32.4 (*)    All other components within normal limits  BASIC METABOLIC PANEL - Abnormal; Notable for the following:    Potassium 3.3 (*)    Glucose, Bld 163 (*)    GFR calc non Af Amer 55 (*)    GFR calc Af Amer 64 (*)    Anion gap 16 (*)    All other components within normal limits  CULTURE, BLOOD (ROUTINE X 2)  CULTURE, BLOOD (ROUTINE X 2)  I-STAT CG4 LACTIC ACID, ED    Imaging Review Dg Chest 2 View  11/01/2014   CLINICAL  DATA:  Shortness of breath, productive cough.  EXAM: CHEST  2 VIEW  COMPARISON:  October 29, 2014.  FINDINGS: The heart size and mediastinal contours are within normal limits. Both lungs are clear. No pneumothorax or pleural effusion is noted. The visualized skeletal structures are unremarkable.  IMPRESSION: No acute cardiopulmonary abnormality seen.   Electronically Signed   By: Sabino Dick M.D.   On: 11/01/2014 20:08     EKG Interpretation   Date/Time:  Monday November 01 2014 17:20:57 EST Ventricular Rate:  96 PR Interval:  170 QRS Duration: 116 QT Interval:  378 QTC Calculation: 477 R Axis:   -  49 Text Interpretation:  Normal sinus rhythm Left anterior fascicular block  Left ventricular hypertrophy with QRS widening Cannot rule out Septal  infarct , age undetermined Abnormal ECG No significant change since last  tracing Confirmed by WARD,  DO, KRISTEN [phone]) on 11/01/2014 8:16:36 PM      MDM   Final diagnoses:  SOB (shortness of breath)  Productive cough    pt is a 68yo male with reports of productive coughing fits and syncopal episodes yesterday due to coughing, presenting to ED today as advised by urgent care with concern for CAP not responding to outpatient PO antibiotics.    Pt states he does feel better today and has not had any syncopal episodes. Denies chest pain, reports minimal SOB today.  Vitals: Temp 100, given acetaminophen in ED.  O2-95-100% on RA, HR & respirations WNL.  On exam, pt is non-toxic appearing, NAD. Lungs: no respiratory distress, however, decreased breath sounds in lower lung fields. No wheeze or rhonchi.    Labs: unremarkable.  CXR: no acute cardiopulmonary abnormality seen  Discussed pt with Dr. Leonides Schanz who also examined pt. Will discharge pt home to f/u with PCP. Advised to finish prescription of azithromycin, advised he may D/C augmentin as CXR shows no acute cardiopulmonary abnormality.    Return precautions provided. Pt verbalized understanding  and agreement with tx plan.    Noland Fordyce, PA-C 11/01/14 2142

## 2014-11-01 NOTE — ED Notes (Signed)
Pt taking Augmentin and Z pack for dx pneumonia this week. Sx not improving. MD sent pt here. Pt reports fevers and episodes of passing out when he coughs for long period of time. Skin warm and dry.

## 2014-11-06 ENCOUNTER — Other Ambulatory Visit: Payer: Self-pay | Admitting: Nurse Practitioner

## 2014-11-08 LAB — CULTURE, BLOOD (ROUTINE X 2)
Culture: NO GROWTH
Culture: NO GROWTH

## 2016-11-13 ENCOUNTER — Inpatient Hospital Stay: Admit: 2016-11-13 | Payer: Non-veteran care | Admitting: Orthopedic Surgery

## 2016-11-13 SURGERY — ARTHROPLASTY, KNEE, TOTAL
Anesthesia: Spinal | Site: Knee | Laterality: Right

## 2017-01-10 ENCOUNTER — Other Ambulatory Visit (HOSPITAL_COMMUNITY): Payer: Self-pay | Admitting: Emergency Medicine

## 2017-01-10 NOTE — Patient Instructions (Signed)
[name]  01/10/2017   Your procedure is scheduled on: 01-22-17  Report to [facility] Main  Entrance take [facility]  elevators to 3rd floor to  [place] [facility] [place].  Call this number if you have problems the morning of surgery [phone]   Remember: ONLY 1 PERSON MAY GO WITH YOU TO SHORT STAY TO GET  READY MORNING OF [place].  Do not eat food or drink liquids :After Midnight.     Take these medicines the morning of surgery with A SIP OF WATER: carvedilol(coreg), colchicine as needed, eye drops as needed                                 You may not have any metal on your body including hair pins and              piercings  Do not wear jewelry, make-up, lotions, powders or perfumes, deodorant             Do not wear nail polish.  Do not shave  48 hours prior to surgery.              Men may shave face and neck.   Do not bring valuables to the hospital. [name].  Contacts, dentures or bridgework may not be worn into surgery.  Leave suitcase in the car. After surgery it may be brought to your room.              Please read over the following fact sheets you were given: _____________________________________________________________________             How to Manage Your Diabetes Before and After Surgery  Why is it important to control my blood sugar before and after surgery? . Improving blood sugar levels before and after surgery helps healing and can limit problems. . A way of improving blood sugar control is eating a healthy diet by: o  Eating less sugar and carbohydrates o  Increasing activity/exercise o  Talking with your doctor about reaching your blood sugar goals . High blood sugars (greater than 180 mg/dL) can raise your risk of infections and slow your recovery, so you will need to focus on controlling your diabetes during the weeks before surgery. . Make sure that the doctor who takes  care of your diabetes knows about your planned surgery including the date and location.  How do I manage my blood sugar before surgery? . Check your blood sugar at least 4 times a day, starting 2 days before surgery, to make sure that the level is not too high or low. o Check your blood sugar the morning of your surgery when you wake up and every 2 hours until you get to the Short Stay unit. . If your blood sugar is less than 70 mg/dL, you will need to treat for low blood sugar: o Do not take insulin. o Treat a low blood sugar (less than 70 mg/dL) with  cup of clear juice (cranberry or apple), 4 glucose tablets, OR glucose gel. o Recheck blood sugar in 15 minutes after treatment (to make sure it is greater than 70 mg/dL). If your blood sugar is not greater than 70 mg/dL on recheck,  call [phone] for further instructions. . Report your blood sugar to the short stay nurse when you get to Short Stay.  . If you are admitted to the hospital after surgery: o Your blood sugar will be checked by the staff and you will probably be given insulin after surgery (instead of oral diabetes medicines) to make sure you have good blood sugar levels. o The goal for blood sugar control after surgery is 80-180 mg/dL.   WHAT DO I DO ABOUT MY DIABETES MEDICATION?   . THE DAY BEFORE 01-21-17 , TAKE USUAL BREAKFAST AND LUNCH DOSES OF NOVOLOG INSULIN. DO NOT TAKE YOUR BEDTIME DOSE! . THE DAY OF SURGERY 01-22-17, IF YOUR BLOOD SUGAR IS OVER 220, TAKE 6 UNITS OF YOUR NOVOLOG INSULIN.  . THE NIGHT BEFORE SURGERY 01-21-17, take 25    units of   LANTUS    Insulin. . THE DAY OF SURGERY 01-22-17, take 25 units of LANTUS insulin        Patient Signature:  Date:   Nurse Signature:  Date:   Reviewed and Endorsed by [facility] Patient Education Committee, August 2015   [facility] - Preparing for Surgery Before surgery, you can play an important role.  Because skin is not sterile, your skin needs to be as free of germs  as possible.  You can reduce the number of germs on your skin by washing with CHG (chlorahexidine gluconate) soap before surgery.  CHG is an antiseptic cleaner which kills germs and bonds with the skin to continue killing germs even after washing. Please DO NOT use if you have an allergy to CHG or antibacterial soaps.  If your skin becomes reddened/irritated stop using the CHG and inform your nurse when you arrive at Short Stay. Do not shave (including legs and underarms) for at least 48 hours prior to the first CHG shower.  You may shave your face/neck. Please follow these instructions carefully:  1.  Shower with CHG Soap the night before surgery and the  morning of Surgery.  2.  If you choose to wash your hair, wash your hair first as usual with your  normal  shampoo.  3.  After you shampoo, rinse your hair and body thoroughly to remove the  shampoo.                           4.  Use CHG as you would any other liquid soap.  You can apply chg directly  to the skin and wash                       Gently with a scrungie or clean washcloth.  5.  Apply the CHG Soap to your body ONLY FROM THE NECK DOWN.   Do not use on face/ open                           Wound or open sores. Avoid contact with eyes, ears mouth and genitals (private parts).                       Wash face,  Genitals (private parts) with your normal soap.             6.  Wash thoroughly, paying special attention to the area where your surgery  will be performed.  7.  Thoroughly rinse your body with warm water from  the neck down.  8.  DO NOT shower/wash with your normal soap after using and rinsing off  the CHG Soap.                9.  Pat yourself dry with a clean towel.            10.  Wear clean pajamas.            11.  Place clean sheets on your bed the night of your first shower and do not  sleep with pets. Day of Surgery : Do not apply any lotions/deodorants the morning of surgery.  Please wear clean clothes to the hospital/surgery  center.  FAILURE TO FOLLOW THESE INSTRUCTIONS MAY RESULT IN THE CANCELLATION OF YOUR SURGERY    Incentive Spirometer  An incentive spirometer is a tool that can help keep your lungs clear and active. This tool measures how well you are filling your lungs with each breath. Taking long deep breaths may help reverse or decrease the chance of developing breathing (pulmonary) problems (especially infection) following:  A long period of time when you are unable to move or be active. BEFORE THE PROCEDURE   If the spirometer includes an indicator to show your best effort, your nurse or respiratory therapist will set it to a desired goal.  If possible, sit up straight or lean slightly forward. Try not to slouch.  Hold the incentive spirometer in an upright position. INSTRUCTIONS FOR USE  1. Sit on the edge of your bed if possible, or sit up as far as you can in bed or on a chair. 2. Hold the incentive spirometer in an upright position. 3. Breathe out normally. 4. Place the mouthpiece in your mouth and seal your lips tightly around it. 5. Breathe in slowly and as deeply as possible, raising the piston or the ball toward the top of the column. 6. Hold your breath for 3-5 seconds or for as long as possible. Allow the piston or ball to fall to the bottom of the column. 7. Remove the mouthpiece from your mouth and breathe out normally. 8. Rest for a few seconds and repeat Steps 1 through 7 at least 10 times every 1-2 hours when you are awake. Take your time and take a few normal breaths between deep breaths. 9. The spirometer may include an indicator to show your best effort. Use the indicator as a goal to work toward during each repetition. 10. After each set of 10 deep breaths, practice coughing to be sure your lungs are clear. If you have an incision (the cut made at the time of surgery), support your incision when coughing by placing a pillow or rolled up towels firmly against it. Once you are  able to get out of bed, walk around indoors and cough well. You may stop using the incentive spirometer when instructed by your caregiver.  RISKS AND COMPLICATIONS  Take your time so you do not get dizzy or light-headed.  If you are in pain, you may need to take or ask for pain medication before doing incentive spirometry. It is harder to take a deep breath if you are having pain. AFTER USE  Rest and breathe slowly and easily.  It can be helpful to keep track of a log of your progress. Your caregiver can provide you with a simple table to help with this. If you are using the spirometer at home, follow these instructions: [place] IF:   You are  having difficultly using the spirometer.  You have trouble using the spirometer as often as instructed.  Your pain medication is not giving enough relief while using the spirometer.  You develop fever of 100.5 F (38.1 C) or higher. SEEK IMMEDIATE MEDICAL CARE IF:   You cough up bloody sputum that had not been present before.  You develop fever of 102 F (38.9 C) or greater.  You develop worsening pain at or near the incision site. MAKE SURE YOU:   Understand these instructions.  Will watch your condition.  Will get help right away if you are not doing well or get worse. Document Released: 03/18/2007 Document Revised: 01/28/2012 Document Reviewed: 05/19/2007 Exit[employer] Patient Information 2014 Exit[employer], [place].   ________________________________________________________________________  WHAT IS A BLOOD TRANSFUSION? Blood Transfusion Information  A transfusion is the replacement of blood or some of its parts. Blood is made up of multiple cells which provide different functions.  Red blood cells carry oxygen and are used for blood loss replacement.  White blood cells fight against infection.  Platelets control bleeding.  Plasma helps clot blood.  Other blood products are available for specialized needs, such as  hemophilia or other clotting disorders. BEFORE THE TRANSFUSION  Who gives blood for transfusions?   Healthy volunteers who are fully evaluated to make sure their blood is safe. This is blood bank blood. Transfusion therapy is the safest it has ever been in the practice of medicine. Before blood is taken from a donor, a complete history is taken to make sure that person has no history of diseases nor engages in risky social behavior (examples are intravenous drug use or sexual activity with multiple partners). The donor's travel history is screened to minimize risk of transmitting infections, such as malaria. The donated blood is tested for signs of infectious diseases, such as HIV and hepatitis. The blood is then tested to be sure it is compatible with you in order to minimize the chance of a transfusion reaction. If you or a relative donates blood, this is often done in anticipation of surgery and is not appropriate for emergency situations. It takes many days to process the donated blood. RISKS AND COMPLICATIONS Although transfusion therapy is very safe and saves many lives, the main dangers of transfusion include:   Getting an infectious disease.  Developing a transfusion reaction. This is an allergic reaction to something in the blood you were given. Every precaution is taken to prevent this. The decision to have a blood transfusion has been considered carefully by your caregiver before blood is given. Blood is not given unless the benefits outweigh the risks. AFTER THE TRANSFUSION  Right after receiving a blood transfusion, you will usually feel much better and more energetic. This is especially true if your red blood cells have gotten low (anemic). The transfusion raises the level of the red blood cells which carry oxygen, and this usually causes an energy increase.  The nurse administering the transfusion will monitor you carefully for complications. HOME CARE INSTRUCTIONS  No special  instructions are needed after a transfusion. You may find your energy is better. Speak with your caregiver about any limitations on activity for underlying diseases you may have. SEEK MEDICAL CARE IF:   Your condition is not improving after your transfusion.  You develop redness or irritation at the intravenous (IV) site. SEEK IMMEDIATE MEDICAL CARE IF:  Any of the following symptoms occur over the next 12 hours:  Shaking chills.  You have a temperature by mouth  above 102 F (38.9 C), not controlled by medicine.  Chest, back, or muscle pain.  People around you feel you are not acting correctly or are confused.  Shortness of breath or difficulty breathing.  Dizziness and fainting.  You get a rash or develop hives.  You have a decrease in urine output.  Your urine turns a dark color or changes to pink, red, or brown. Any of the following symptoms occur over the next 10 days:  You have a temperature by mouth above 102 F (38.9 C), not controlled by medicine.  Shortness of breath.  Weakness after normal activity.  The white part of the eye turns yellow (jaundice).  You have a decrease in the amount of urine or are urinating less often.  Your urine turns a dark color or changes to pink, red, or brown. Document Released: 11/02/2000 Document Revised: 01/28/2012 Document Reviewed: 06/21/2008 [facility] Patient Information 2014 [place], [place].  _______________________________________________________________________

## 2017-01-10 NOTE — H&P (Addendum)
TOTAL KNEE ADMISSION H&P  Patient is being admitted for right total knee arthroplasty.  Subjective:  Chief Complaint:    Right knee primary OA / pain  HPI: [name], 71 y.o. male, has a history of pain and functional disability in the right knee due to arthritis and has failed non-surgical conservative treatments for greater than 12 weeks to include NSAID's and/or analgesics, corticosteriod injections and activity modification.  Onset of symptoms was gradual, starting >10 years ago with gradually worsening course since that time. The patient noted prior procedures on the knee to include  arthroscopy on the right knee(s).  Patient currently rates pain in the right knee(s) at 9 out of 10 with activity. Patient has night pain, worsening of pain with activity and weight bearing, pain that interferes with activities of daily living, pain with passive range of motion, crepitus and joint swelling.  Patient has evidence of periarticular osteophytes and joint space narrowing by imaging studies.  There is no active infection.   Risks, benefits and expectations were discussed with the patient.  Risks including but not limited to the risk of anesthesia, blood clots, nerve damage, blood vessel damage, failure of the prosthesis, infection and up to and including death.  Patient understand the risks, benefits and expectations and wishes to proceed with surgery.    PCP: Mayra Neer, MD  D/C Plans:       Home (possibly son's house)  Post-op Meds:       No Rx given  Tranexamic Acid:      To be given - topically (previous blood clots)   Decadron:      Is to be given  FYI:     Xarelto then ASA (previous blood clots)  OR lovenox if kidney function decreased  Norco  PT: Rx given for [place]  DME: Patient already has    Patient Active Problem List   Diagnosis Date Noted  . Cryptogenic stroke ([place]) 08/03/2014  . Gout 03/13/2014  . CVA (cerebral infarction) 03/12/2014  . Fever 09/18/2012  . DM  (diabetes mellitus) ([place]) 09/18/2012  . CKD (chronic kidney disease) 09/18/2012  . Anemia 09/18/2012  . HTN (hypertension) 09/18/2012  . Hyperlipidemia 09/18/2012  . 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 Agent Orange poisoning   . Anemia   . Diabetes mellitus   . Diverticulosis   . Gout   . History of nonmelanoma skin cancer   . Hypercholesterolemia   . Hypertension   . Prostate cancer   . Renal insufficiency   . Sleep apnea     Past Surgical History:  Procedure Laterality Date  . MOHS SURGERY    . TOTAL HIP ARTHROPLASTY      No prescriptions prior to admission.   No Known Allergies   Social History  Substance Use Topics  . Smoking status: Never Smoker  . Smokeless tobacco: Not on file  . Alcohol use No       Review of Systems  Constitutional: Negative.   HENT: Negative.   Eyes: Negative.   Respiratory: Negative.   Cardiovascular: Negative.   Gastrointestinal: Negative.   Genitourinary: Positive for frequency.  Musculoskeletal: Positive for joint pain.  Skin: Negative.   Neurological: Negative.   Endo/Heme/Allergies: Negative.   Psychiatric/Behavioral: Negative.     Objective:  Physical Exam  Constitutional: He is oriented to person, place, and time. He appears well-developed.  HENT:  Head: Normocephalic.  Eyes: Pupils are equal,  round, and reactive to light.  Neck: Neck supple. No JVD present. No tracheal deviation present. No thyromegaly present.  Cardiovascular: Normal rate, regular rhythm, normal heart sounds and intact distal pulses.   Respiratory: Effort normal and breath sounds normal. No respiratory distress. He has no wheezes.  GI: Soft. There is no tenderness. There is no guarding.  Musculoskeletal:       Right knee: He exhibits decreased range of motion, swelling and bony tenderness. He exhibits no ecchymosis, no deformity, no laceration and no erythema. Tenderness found.   Lymphadenopathy:    He has no cervical adenopathy.  Neurological: He is alert and oriented to person, place, and time. A sensory deficit (bilateral DM neuropathy) is present.  Skin: Skin is warm and dry.  Psychiatric: He has a normal mood and affect.     Labs:  Estimated body mass index is 32.86 kg/m as calculated from the following:   Height as of 08/03/14: 5\' 10"  (1.778 m).   Weight as of 08/03/14: 103.9 kg (229 lb).   Imaging Review Plain radiographs demonstrate severe degenerative joint disease of the right knee(s).  The bone quality appears to be good for age and reported activity level.  Assessment/Plan:  End stage arthritis, right knee   The patient history, physical examination, clinical judgment of the provider and imaging studies are consistent with end stage degenerative joint disease of the right knee(s) and total knee arthroplasty is deemed medically necessary. The treatment options including medical management, injection therapy arthroscopy and arthroplasty were discussed at length. The risks and benefits of total knee arthroplasty were presented and reviewed. The risks due to aseptic loosening, infection, stiffness, patella tracking problems, thromboembolic complications and other imponderables were discussed. The patient acknowledged the explanation, agreed to proceed with the plan and consent was signed. Patient is being admitted for inpatient treatment for surgery, pain control, PT, OT, prophylactic antibiotics, VTE prophylaxis, progressive ambulation and ADL's and discharge planning. The patient is planning to be discharged home.      [place] Pugh Ani Deoliveira   PA-C  01/10/2017, 1:12 PM

## 2017-01-14 ENCOUNTER — Encounter (INDEPENDENT_AMBULATORY_CARE_PROVIDER_SITE_OTHER): Payer: Self-pay

## 2017-01-14 ENCOUNTER — Encounter (HOSPITAL_COMMUNITY)
Admission: RE | Admit: 2017-01-14 | Discharge: 2017-01-14 | Disposition: A | Discharge status: Home / Self Care | Payer: Non-veteran care | Source: Ambulatory Visit | Attending: Orthopedic Surgery | Admitting: Orthopedic Surgery

## 2017-01-14 ENCOUNTER — Encounter (HOSPITAL_COMMUNITY): Payer: Self-pay

## 2017-01-14 DIAGNOSIS — E119 Type 2 diabetes mellitus without complications: Secondary | ICD-10-CM | POA: Insufficient documentation

## 2017-01-14 DIAGNOSIS — Z01812 Encounter for preprocedural laboratory examination: Secondary | ICD-10-CM | POA: Diagnosis present

## 2017-01-14 DIAGNOSIS — Z01810 Encounter for preprocedural cardiovascular examination: Secondary | ICD-10-CM | POA: Diagnosis not present

## 2017-01-14 DIAGNOSIS — R9431 Abnormal electrocardiogram [ECG] [EKG]: Secondary | ICD-10-CM | POA: Diagnosis not present

## 2017-01-14 DIAGNOSIS — I517 Cardiomegaly: Secondary | ICD-10-CM | POA: Diagnosis not present

## 2017-01-14 LAB — CBC
HEMATOCRIT: 33.8 % — AB (ref 39.0–52.0)
HEMOGLOBIN: 11.1 g/dL — AB (ref 13.0–17.0)
MCH: 27.8 pg (ref 26.0–34.0)
MCHC: 32.8 g/dL (ref 30.0–36.0)
MCV: 84.7 fL (ref 78.0–100.0)
Platelets: 213 10*3/uL (ref 150–400)
RBC: 3.99 MIL/uL — ABNORMAL LOW (ref 4.22–5.81)
RDW: 13.4 % (ref 11.5–15.5)
WBC: 8.2 10*3/uL (ref 4.0–10.5)

## 2017-01-14 LAB — BASIC METABOLIC PANEL
ANION GAP: 5 (ref 5–15)
BUN: 16 mg/dL (ref 6–20)
CHLORIDE: 112 mmol/L — AB (ref 101–111)
CO2: 27 mmol/L (ref 22–32)
Calcium: 9.2 mg/dL (ref 8.9–10.3)
Creatinine, Ser: 1.38 mg/dL — ABNORMAL HIGH (ref 0.61–1.24)
GFR calc non Af Amer: 50 mL/min — ABNORMAL LOW (ref >60)
GFR, EST AFRICAN AMERICAN: 58 mL/min — AB (ref >60)
Glucose, Bld: 97 mg/dL (ref 65–99)
POTASSIUM: 4 mmol/L (ref 3.5–5.1)
Sodium: 144 mmol/L (ref 135–145)

## 2017-01-14 LAB — SURGICAL PCR SCREEN
MRSA, PCR: NEGATIVE
STAPHYLOCOCCUS AUREUS: NEGATIVE

## 2017-01-14 LAB — ABO/RH: ABO/RH(D): B POS

## 2017-01-14 LAB — GLUCOSE, CAPILLARY: Glucose-Capillary: 95 mg/dL (ref 65–99)

## 2017-01-14 NOTE — Progress Notes (Signed)
Patient states he is supposed to be having his right knee done first. However, [facility] informed consent in EPIC notes the left knee as the operative site. LVMM with Judeen Hammans at Dr Alvan Dame office concerning this and the need for new consent to be entered into epic.

## 2017-01-15 LAB — HEMOGLOBIN A1C
Hgb A1c MFr Bld: 7.7 % — ABNORMAL HIGH (ref 4.8–5.6)
MEAN PLASMA GLUCOSE: 174 mg/dL

## 2017-01-22 ENCOUNTER — Encounter (HOSPITAL_COMMUNITY)
Admission: RE | Disposition: A | Discharge status: Home / Self Care | Payer: Self-pay | Source: Ambulatory Visit | Attending: Orthopedic Surgery

## 2017-01-22 ENCOUNTER — Inpatient Hospital Stay (HOSPITAL_COMMUNITY): Payer: Non-veteran care | Admitting: Anesthesiology

## 2017-01-22 ENCOUNTER — Encounter (HOSPITAL_COMMUNITY): Payer: Self-pay | Admitting: *Deleted

## 2017-01-22 ENCOUNTER — Observation Stay (HOSPITAL_COMMUNITY)
Admission: RE | Admit: 2017-01-22 | Discharge: 2017-01-23 | Disposition: A | Discharge status: Home / Self Care | Payer: Non-veteran care | Source: Ambulatory Visit | Attending: Orthopedic Surgery | Admitting: Orthopedic Surgery

## 2017-01-22 DIAGNOSIS — N189 Chronic kidney disease, unspecified: Secondary | ICD-10-CM | POA: Diagnosis not present

## 2017-01-22 DIAGNOSIS — Z8673 Personal history of transient ischemic attack (TIA), and cerebral infarction without residual deficits: Secondary | ICD-10-CM | POA: Diagnosis not present

## 2017-01-22 DIAGNOSIS — Z85828 Personal history of other malignant neoplasm of skin: Secondary | ICD-10-CM | POA: Diagnosis not present

## 2017-01-22 DIAGNOSIS — Z794 Long term (current) use of insulin: Secondary | ICD-10-CM | POA: Insufficient documentation

## 2017-01-22 DIAGNOSIS — Z79899 Other long term (current) drug therapy: Secondary | ICD-10-CM | POA: Diagnosis not present

## 2017-01-22 DIAGNOSIS — Z96659 Presence of unspecified artificial knee joint: Secondary | ICD-10-CM

## 2017-01-22 DIAGNOSIS — M1711 Unilateral primary osteoarthritis, right knee: Secondary | ICD-10-CM | POA: Diagnosis not present

## 2017-01-22 DIAGNOSIS — E785 Hyperlipidemia, unspecified: Secondary | ICD-10-CM | POA: Insufficient documentation

## 2017-01-22 DIAGNOSIS — E1122 Type 2 diabetes mellitus with diabetic chronic kidney disease: Secondary | ICD-10-CM | POA: Diagnosis not present

## 2017-01-22 DIAGNOSIS — I129 Hypertensive chronic kidney disease with stage 1 through stage 4 chronic kidney disease, or unspecified chronic kidney disease: Secondary | ICD-10-CM | POA: Diagnosis not present

## 2017-01-22 DIAGNOSIS — Z8546 Personal history of malignant neoplasm of prostate: Secondary | ICD-10-CM | POA: Insufficient documentation

## 2017-01-22 DIAGNOSIS — G4730 Sleep apnea, unspecified: Secondary | ICD-10-CM | POA: Diagnosis not present

## 2017-01-22 DIAGNOSIS — M109 Gout, unspecified: Secondary | ICD-10-CM | POA: Diagnosis not present

## 2017-01-22 DIAGNOSIS — D631 Anemia in chronic kidney disease: Secondary | ICD-10-CM | POA: Diagnosis not present

## 2017-01-22 DIAGNOSIS — E7800 Pure hypercholesterolemia, unspecified: Secondary | ICD-10-CM | POA: Insufficient documentation

## 2017-01-22 DIAGNOSIS — Z96651 Presence of right artificial knee joint: Secondary | ICD-10-CM

## 2017-01-22 HISTORY — PX: TOTAL KNEE ARTHROPLASTY: SHX125

## 2017-01-22 LAB — GLUCOSE, CAPILLARY
GLUCOSE-CAPILLARY: 94 mg/dL (ref 65–99)
Glucose-Capillary: 74 mg/dL (ref 65–99)
Glucose-Capillary: 271 mg/dL — ABNORMAL HIGH (ref 65–99)
Glucose-Capillary: 288 mg/dL — ABNORMAL HIGH (ref 65–99)

## 2017-01-22 LAB — TYPE AND SCREEN
ABO/RH(D): B POS
Antibody Screen: NEGATIVE

## 2017-01-22 SURGERY — ARTHROPLASTY, KNEE, TOTAL
Anesthesia: General | Site: Knee | Laterality: Right

## 2017-01-22 MED ORDER — DOCUSATE SODIUM 100 MG PO CAPS: 100.0000 mg | ORAL_CAPSULE | Freq: Two times a day (BID) | ORAL | 0 refills | Status: DC

## 2017-01-22 MED ORDER — CARVEDILOL 12.5 MG PO TABS
12.5000 mg | ORAL_TABLET | Freq: Two times a day (BID) | ORAL | Status: DC
  Administered 2017-01-22: 12.5 mg via ORAL
  Filled 2017-01-22 (×4): qty 1

## 2017-01-22 MED ORDER — ALUM & MAG HYDROXIDE-SIMETH 200-200-20 MG/5ML PO SUSP: 30.0000 mL | ORAL | Status: DC | PRN

## 2017-01-22 MED ORDER — ASPIRIN 81 MG PO CHEW
81.0000 mg | CHEWABLE_TABLET | Freq: Two times a day (BID) | ORAL | Status: DC
Start: 2017-01-22 — End: 2017-01-23
  Administered 2017-01-22 – 2017-01-23 (×2): 81 mg via ORAL
  Filled 2017-01-22 (×2): qty 1

## 2017-01-22 MED ORDER — COLCHICINE 0.6 MG PO TABS
0.6000 mg | ORAL_TABLET | Freq: Every day | ORAL | Status: DC | PRN
  Filled 2017-01-22: qty 1

## 2017-01-22 MED ORDER — DEXAMETHASONE SODIUM PHOSPHATE 10 MG/ML IJ SOLN
INTRAMUSCULAR | Status: AC
  Filled 2017-01-22: qty 1

## 2017-01-22 MED ORDER — TRANEXAMIC ACID 1000 MG/10ML IV SOLN
INTRAVENOUS | Status: DC | PRN
  Administered 2017-01-22: 2000 mg via TOPICAL

## 2017-01-22 MED ORDER — DEXAMETHASONE SODIUM PHOSPHATE 10 MG/ML IJ SOLN
10.0000 mg | Freq: Once | INTRAMUSCULAR | Status: AC
  Administered 2017-01-23: 10 mg via INTRAVENOUS
  Filled 2017-01-22: qty 1

## 2017-01-22 MED ORDER — DIPHENHYDRAMINE HCL 25 MG PO CAPS: 25.0000 mg | ORAL_CAPSULE | Freq: Four times a day (QID) | ORAL | Status: DC | PRN

## 2017-01-22 MED ORDER — SODIUM CHLORIDE 0.9 % IV SOLN
INTRAVENOUS | Status: DC
  Administered 2017-01-22 – 2017-01-23 (×2): via INTRAVENOUS
  Filled 2017-01-22 (×3): qty 1000

## 2017-01-22 MED ORDER — METHOCARBAMOL 1000 MG/10ML IJ SOLN
500.0000 mg | Freq: Four times a day (QID) | INTRAVENOUS | Status: DC | PRN
  Administered 2017-01-22: 500 mg via INTRAVENOUS
  Filled 2017-01-22: qty 550
  Filled 2017-01-22: qty 5

## 2017-01-22 MED ORDER — LIDOCAINE 2% (20 MG/ML) 5 ML SYRINGE
INTRAMUSCULAR | Status: DC | PRN
  Administered 2017-01-22: 60 mg via INTRAVENOUS

## 2017-01-22 MED ORDER — ONDANSETRON HCL 4 MG PO TABS: 4.0000 mg | ORAL_TABLET | Freq: Four times a day (QID) | ORAL | Status: DC | PRN

## 2017-01-22 MED ORDER — BUPIVACAINE HCL (PF) 0.25 % IJ SOLN
INTRAMUSCULAR | Status: AC
  Filled 2017-01-22: qty 30

## 2017-01-22 MED ORDER — AMLODIPINE BESYLATE 10 MG PO TABS
10.0000 mg | ORAL_TABLET | Freq: Every day | ORAL | Status: DC
  Administered 2017-01-22: 23:00:00 10 mg via ORAL
  Filled 2017-01-22: qty 1

## 2017-01-22 MED ORDER — DEXAMETHASONE SODIUM PHOSPHATE 10 MG/ML IJ SOLN
10.0000 mg | Freq: Once | INTRAMUSCULAR | Status: AC
  Administered 2017-01-22: 10 mg via INTRAVENOUS

## 2017-01-22 MED ORDER — AMITRIPTYLINE HCL 50 MG PO TABS
50.0000 mg | ORAL_TABLET | Freq: Every day | ORAL | Status: DC
  Administered 2017-01-22: 23:00:00 50 mg via ORAL
  Filled 2017-01-22 (×2): qty 1

## 2017-01-22 MED ORDER — ONDANSETRON HCL 4 MG/2ML IJ SOLN
INTRAMUSCULAR | Status: DC | PRN
  Administered 2017-01-22: 4 mg via INTRAVENOUS

## 2017-01-22 MED ORDER — MIDAZOLAM HCL 5 MG/5ML IJ SOLN
INTRAMUSCULAR | Status: DC | PRN
  Administered 2017-01-22: 1 mg via INTRAVENOUS

## 2017-01-22 MED ORDER — EPHEDRINE 5 MG/ML INJ
INTRAVENOUS | Status: AC
  Filled 2017-01-22: qty 10

## 2017-01-22 MED ORDER — HYDROMORPHONE HCL 1 MG/ML IJ SOLN
0.2500 mg | INTRAMUSCULAR | Status: DC | PRN
  Administered 2017-01-22: 0.5 mg via INTRAVENOUS

## 2017-01-22 MED ORDER — EPHEDRINE SULFATE 50 MG/ML IJ SOLN
INTRAMUSCULAR | Status: DC | PRN
  Administered 2017-01-22 (×4): 10 mg via INTRAVENOUS

## 2017-01-22 MED ORDER — CEFAZOLIN SODIUM-DEXTROSE 2-4 GM/100ML-% IV SOLN
2.0000 g | INTRAVENOUS | Status: AC
  Administered 2017-01-22: 2 g via INTRAVENOUS
  Filled 2017-01-22: qty 100

## 2017-01-22 MED ORDER — CELECOXIB 200 MG PO CAPS: 200.0000 mg | ORAL_CAPSULE | Freq: Two times a day (BID) | ORAL | 0 refills | Status: DC

## 2017-01-22 MED ORDER — PROPOFOL 500 MG/50ML IV EMUL
INTRAVENOUS | Status: DC | PRN
  Administered 2017-01-22: 50 ug/kg/min via INTRAVENOUS

## 2017-01-22 MED ORDER — BISACODYL 10 MG RE SUPP: 10.0000 mg | Freq: Every day | RECTAL | Status: DC | PRN

## 2017-01-22 MED ORDER — KETOROLAC TROMETHAMINE 30 MG/ML IJ SOLN
INTRAMUSCULAR | Status: DC | PRN
Start: 2017-01-22 — End: 2017-01-22
  Administered 2017-01-22: 30 mg

## 2017-01-22 MED ORDER — POLYETHYLENE GLYCOL 3350 17 G PO PACK: 17.0000 g | PACK | Freq: Two times a day (BID) | ORAL | 0 refills | Status: DC

## 2017-01-22 MED ORDER — 0.9 % SODIUM CHLORIDE (POUR BTL) OPTIME
TOPICAL | Status: DC | PRN
  Administered 2017-01-22: 1000 mL

## 2017-01-22 MED ORDER — METHOCARBAMOL 500 MG PO TABS: 500.0000 mg | ORAL_TABLET | Freq: Four times a day (QID) | ORAL | 0 refills | Status: DC | PRN

## 2017-01-22 MED ORDER — BUPIVACAINE-EPINEPHRINE (PF) 0.25% -1:200000 IJ SOLN
INTRAMUSCULAR | Status: DC | PRN
  Administered 2017-01-22: 30 mL

## 2017-01-22 MED ORDER — STERILE WATER FOR IRRIGATION IR SOLN
Status: DC | PRN
  Administered 2017-01-22: 2000 mL

## 2017-01-22 MED ORDER — HYDROMORPHONE HCL 1 MG/ML IJ SOLN
INTRAMUSCULAR | Status: AC
  Filled 2017-01-22: qty 1

## 2017-01-22 MED ORDER — TRANEXAMIC ACID 1000 MG/10ML IV SOLN
2000.0000 mg | Freq: Once | INTRAVENOUS | Status: DC
  Filled 2017-01-22: qty 20

## 2017-01-22 MED ORDER — METHOCARBAMOL 500 MG PO TABS: 500.0000 mg | ORAL_TABLET | Freq: Four times a day (QID) | ORAL | Status: DC | PRN

## 2017-01-22 MED ORDER — HYDROCODONE-ACETAMINOPHEN 7.5-325 MG PO TABS: 1.0000 | ORAL_TABLET | ORAL | 0 refills | Status: DC | PRN

## 2017-01-22 MED ORDER — PHENOL 1.4 % MT LIQD
1.0000 | OROMUCOSAL | Status: DC | PRN
  Filled 2017-01-22: qty 177

## 2017-01-22 MED ORDER — LIDOCAINE 2% (20 MG/ML) 5 ML SYRINGE
INTRAMUSCULAR | Status: AC
  Filled 2017-01-22: qty 5

## 2017-01-22 MED ORDER — CELECOXIB 200 MG PO CAPS
200.0000 mg | ORAL_CAPSULE | Freq: Two times a day (BID) | ORAL | Status: DC
  Administered 2017-01-22 – 2017-01-23 (×2): 200 mg via ORAL
  Filled 2017-01-22 (×2): qty 1

## 2017-01-22 MED ORDER — PROPOFOL 10 MG/ML IV BOLUS
INTRAVENOUS | Status: AC
  Filled 2017-01-22: qty 60

## 2017-01-22 MED ORDER — SUCCINYLCHOLINE CHLORIDE 200 MG/10ML IV SOSY
PREFILLED_SYRINGE | INTRAVENOUS | Status: AC
  Filled 2017-01-22: qty 10

## 2017-01-22 MED ORDER — FERROUS SULFATE 325 (65 FE) MG PO TABS
325.0000 mg | ORAL_TABLET | Freq: Three times a day (TID) | ORAL | Status: DC
  Administered 2017-01-22 – 2017-01-23 (×3): 325 mg via ORAL
  Filled 2017-01-22 (×3): qty 1

## 2017-01-22 MED ORDER — CHLORHEXIDINE GLUCONATE 4 % EX LIQD
60.0000 mL | Freq: Once | CUTANEOUS | Status: DC
Start: 2017-01-22 — End: 2017-01-22

## 2017-01-22 MED ORDER — SODIUM CHLORIDE 0.9 % IJ SOLN
INTRAMUSCULAR | Status: AC
  Filled 2017-01-22: qty 50

## 2017-01-22 MED ORDER — KETOROLAC TROMETHAMINE 30 MG/ML IJ SOLN
INTRAMUSCULAR | Status: AC
  Filled 2017-01-22: qty 1

## 2017-01-22 MED ORDER — ONDANSETRON HCL 4 MG/2ML IJ SOLN
INTRAMUSCULAR | Status: AC
  Filled 2017-01-22: qty 2

## 2017-01-22 MED ORDER — ONDANSETRON HCL 4 MG/2ML IJ SOLN: 4.0000 mg | Freq: Four times a day (QID) | INTRAMUSCULAR | Status: DC | PRN

## 2017-01-22 MED ORDER — MENTHOL 3 MG MT LOZG: 1.0000 | LOZENGE | OROMUCOSAL | Status: DC | PRN

## 2017-01-22 MED ORDER — HYDROCODONE-ACETAMINOPHEN 7.5-325 MG PO TABS
1.0000 | ORAL_TABLET | ORAL | Status: DC
  Administered 2017-01-22 (×2): 1 via ORAL
  Administered 2017-01-23 (×5): 2 via ORAL
  Filled 2017-01-22: qty 2
  Filled 2017-01-22: qty 1
  Filled 2017-01-22 (×5): qty 2

## 2017-01-22 MED ORDER — FENTANYL CITRATE (PF) 250 MCG/5ML IJ SOLN
INTRAMUSCULAR | Status: AC
  Filled 2017-01-22: qty 5

## 2017-01-22 MED ORDER — CEFAZOLIN SODIUM-DEXTROSE 2-4 GM/100ML-% IV SOLN
2.0000 g | Freq: Four times a day (QID) | INTRAVENOUS | Status: AC
  Administered 2017-01-22 (×2): 2 g via INTRAVENOUS
  Filled 2017-01-22 (×2): qty 100

## 2017-01-22 MED ORDER — FEBUXOSTAT 40 MG PO TABS
40.0000 mg | ORAL_TABLET | Freq: Every day | ORAL | Status: DC
  Administered 2017-01-22: 40 mg via ORAL
  Filled 2017-01-22 (×2): qty 1

## 2017-01-22 MED ORDER — MIDAZOLAM HCL 2 MG/2ML IJ SOLN
1.0000 mg | Freq: Once | INTRAMUSCULAR | Status: AC
  Administered 2017-01-22: 1 mg via INTRAVENOUS

## 2017-01-22 MED ORDER — ENOXAPARIN SODIUM 40 MG/0.4ML ~~LOC~~ SOLN: 40.0000 mg | SUBCUTANEOUS | 0 refills | Status: DC

## 2017-01-22 MED ORDER — ROCURONIUM BROMIDE 50 MG/5ML IV SOSY
PREFILLED_SYRINGE | INTRAVENOUS | Status: AC
  Filled 2017-01-22: qty 5

## 2017-01-22 MED ORDER — HYDROMORPHONE HCL 1 MG/ML IJ SOLN: 0.2500 mg | INTRAMUSCULAR | Status: DC | PRN

## 2017-01-22 MED ORDER — DOCUSATE SODIUM 100 MG PO CAPS
100.0000 mg | ORAL_CAPSULE | Freq: Two times a day (BID) | ORAL | Status: DC
  Administered 2017-01-22 – 2017-01-23 (×2): 100 mg via ORAL
  Filled 2017-01-22 (×2): qty 1

## 2017-01-22 MED ORDER — MIDAZOLAM HCL 2 MG/2ML IJ SOLN
INTRAMUSCULAR | Status: AC
  Filled 2017-01-22: qty 2

## 2017-01-22 MED ORDER — SODIUM CHLORIDE 0.9 % IJ SOLN
INTRAMUSCULAR | Status: DC | PRN
  Administered 2017-01-22: 30 mL

## 2017-01-22 MED ORDER — HYDROMORPHONE HCL 1 MG/ML IJ SOLN: 0.5000 mg | INTRAMUSCULAR | Status: DC | PRN

## 2017-01-22 MED ORDER — PROPOFOL 10 MG/ML IV BOLUS
INTRAVENOUS | Status: DC | PRN
  Administered 2017-01-22: 20 mg via INTRAVENOUS

## 2017-01-22 MED ORDER — MAGNESIUM CITRATE PO SOLN: 1.0000 | Freq: Once | ORAL | Status: DC | PRN

## 2017-01-22 MED ORDER — CARVEDILOL 12.5 MG PO TABS
12.5000 mg | ORAL_TABLET | Freq: Two times a day (BID) | ORAL | Status: DC
  Administered 2017-01-22 – 2017-01-23 (×2): 12.5 mg via ORAL
  Filled 2017-01-22 (×2): qty 1

## 2017-01-22 MED ORDER — HYDROCORTISONE 1 % EX CREA
TOPICAL_CREAM | Freq: Every day | CUTANEOUS | Status: DC
  Administered 2017-01-22: 1 via TOPICAL
  Filled 2017-01-22: qty 28

## 2017-01-22 MED ORDER — INSULIN GLARGINE 100 UNIT/ML ~~LOC~~ SOLN
50.0000 [IU] | Freq: Every day | SUBCUTANEOUS | Status: DC
  Administered 2017-01-22: 23:00:00 50 [IU] via SUBCUTANEOUS
  Filled 2017-01-22 (×2): qty 0.5

## 2017-01-22 MED ORDER — METOCLOPRAMIDE HCL 5 MG PO TABS
5.0000 mg | ORAL_TABLET | Freq: Three times a day (TID) | ORAL | Status: DC | PRN
Start: 2017-01-22 — End: 2017-01-23

## 2017-01-22 MED ORDER — LACTATED RINGERS IV SOLN
INTRAVENOUS | Status: DC
  Administered 2017-01-22 (×3): via INTRAVENOUS

## 2017-01-22 MED ORDER — ATORVASTATIN CALCIUM 20 MG PO TABS
20.0000 mg | ORAL_TABLET | Freq: Every day | ORAL | Status: DC
  Administered 2017-01-22: 23:00:00 20 mg via ORAL
  Filled 2017-01-22: qty 1

## 2017-01-22 MED ORDER — ASPIRIN 81 MG PO CHEW: 81.0000 mg | CHEWABLE_TABLET | Freq: Two times a day (BID) | ORAL | 0 refills | Status: DC

## 2017-01-22 MED ORDER — METOCLOPRAMIDE HCL 5 MG/ML IJ SOLN: 5.0000 mg | Freq: Three times a day (TID) | INTRAMUSCULAR | Status: DC | PRN

## 2017-01-22 MED ORDER — BUPIVACAINE-EPINEPHRINE 0.25% -1:200000 IJ SOLN
INTRAMUSCULAR | Status: AC
  Filled 2017-01-22: qty 1

## 2017-01-22 MED ORDER — POLYETHYLENE GLYCOL 3350 17 G PO PACK
17.0000 g | PACK | Freq: Two times a day (BID) | ORAL | Status: DC
  Administered 2017-01-23: 17 g via ORAL
  Filled 2017-01-22 (×2): qty 1

## 2017-01-22 MED ORDER — INSULIN ASPART 100 UNIT/ML ~~LOC~~ SOLN
0.0000 [IU] | Freq: Three times a day (TID) | SUBCUTANEOUS | Status: DC
  Administered 2017-01-22: 8 [IU] via SUBCUTANEOUS
  Administered 2017-01-23: 13:00:00 3 [IU] via SUBCUTANEOUS
  Administered 2017-01-23: 5 [IU] via SUBCUTANEOUS

## 2017-01-22 MED ORDER — HYDROCORTISONE 2.5 % EX CREA: 1.0000 "application " | TOPICAL_CREAM | Freq: Every day | CUTANEOUS | Status: DC

## 2017-01-22 MED ORDER — FERROUS SULFATE 325 (65 FE) MG PO TABS: 325.0000 mg | ORAL_TABLET | Freq: Three times a day (TID) | ORAL | Status: AC

## 2017-01-22 SURGICAL SUPPLY — 48 items
BAG DECANTER FOR FLEXI CONT (MISCELLANEOUS) ×3 IMPLANT
BAG ZIPLOCK 12X15 (MISCELLANEOUS) IMPLANT
BANDAGE ACE 6X5 VEL STRL LF (GAUZE/BANDAGES/DRESSINGS) ×3 IMPLANT
BLADE SAW SGTL 11.0X1.19X90.0M (BLADE) IMPLANT
BLADE SAW SGTL 13.0X1.19X90.0M (BLADE) ×3 IMPLANT
BONE CEMENT GENTAMICIN (Cement) ×6 IMPLANT
BOWL SMART MIX CTS (DISPOSABLE) ×3 IMPLANT
CAPT KNEE TOTAL 3 ATTUNE ×3 IMPLANT
CEMENT BONE GENTAMICIN 40 (Cement) ×2 IMPLANT
CLOTH BEACON ORANGE TIMEOUT ST (SAFETY) IMPLANT
CUFF TOURN SGL QUICK 34 (TOURNIQUET CUFF) ×2
CUFF TRNQT CYL 34X4X40X1 (TOURNIQUET CUFF) ×1 IMPLANT
DECANTER SPIKE VIAL GLASS SM (MISCELLANEOUS) ×3 IMPLANT
DERMABOND ADVANCED (GAUZE/BANDAGES/DRESSINGS) ×2
DERMABOND ADVANCED .7 DNX12 (GAUZE/BANDAGES/DRESSINGS) ×1 IMPLANT
DRAPE U-SHAPE 47X51 STRL (DRAPES) ×3 IMPLANT
DRESSING AQUACEL AG SP 3.5X10 (GAUZE/BANDAGES/DRESSINGS) ×1 IMPLANT
DRSG AQUACEL AG ADV 3.5X10 (GAUZE/BANDAGES/DRESSINGS) ×3 IMPLANT
DRSG AQUACEL AG SP 3.5X10 (GAUZE/BANDAGES/DRESSINGS) ×3
DURAPREP 26ML APPLICATOR (WOUND CARE) ×6 IMPLANT
ELECT REM PT RETURN 9FT ADLT (ELECTROSURGICAL) ×3
ELECTRODE REM PT RTRN 9FT ADLT (ELECTROSURGICAL) ×1 IMPLANT
GLOVE BIOGEL M 7.0 STRL (GLOVE) IMPLANT
GLOVE BIOGEL PI IND STRL 7.5 (GLOVE) ×1 IMPLANT
GLOVE BIOGEL PI IND STRL 8.5 (GLOVE) ×1 IMPLANT
GLOVE BIOGEL PI INDICATOR 7.5 (GLOVE) ×2
GLOVE BIOGEL PI INDICATOR 8.5 (GLOVE) ×2
GLOVE ECLIPSE 8.0 STRL XLNG CF (GLOVE) ×6 IMPLANT
GLOVE ORTHO TXT STRL SZ7.5 (GLOVE) ×6 IMPLANT
GOWN STRL REUS W/TWL LRG LVL3 (GOWN DISPOSABLE) ×6 IMPLANT
GOWN STRL REUS W/TWL XL LVL3 (GOWN DISPOSABLE) ×3 IMPLANT
HANDPIECE INTERPULSE COAX TIP (DISPOSABLE) ×2
MANIFOLD NEPTUNE II (INSTRUMENTS) ×3 IMPLANT
MARKER SKIN DUAL TIP RULER LAB (MISCELLANEOUS) ×3 IMPLANT
PACK TOTAL KNEE CUSTOM (KITS) ×3 IMPLANT
POSITIONER SURGICAL ARM (MISCELLANEOUS) ×3 IMPLANT
SET HNDPC FAN SPRY TIP SCT (DISPOSABLE) ×1 IMPLANT
SET PAD KNEE POSITIONER (MISCELLANEOUS) ×3 IMPLANT
SUT MNCRL AB 4-0 PS2 18 (SUTURE) ×3 IMPLANT
SUT VIC AB 1 CT1 36 (SUTURE) ×3 IMPLANT
SUT VIC AB 2-0 CT1 27 (SUTURE) ×6
SUT VIC AB 2-0 CT1 TAPERPNT 27 (SUTURE) ×3 IMPLANT
SUT VLOC 180 0 24IN GS25 (SUTURE) ×3 IMPLANT
SYR 50ML LL SCALE MARK (SYRINGE) ×3 IMPLANT
TRAY FOLEY W/METER SILVER 16FR (SET/KITS/TRAYS/PACK) ×3 IMPLANT
WATER STERILE IRR 1500ML POUR (IV SOLUTION) ×3 IMPLANT
WRAP KNEE MAXI GEL POST OP (GAUZE/BANDAGES/DRESSINGS) ×3 IMPLANT
YANKAUER SUCT BULB TIP 10FT TU (MISCELLANEOUS) ×3 IMPLANT

## 2017-01-22 NOTE — Interval H&P Note (Signed)
History and Physical Interval Note:  01/22/2017 9:24 AM  [name]  has presented today for surgery, with the diagnosis of RIGHT knee osteoarthritis  The various methods of treatment have been discussed with the patient and family. After consideration of risks, benefits and other options for treatment, the patient has consented to  Procedure(s): RIGHT TOTAL KNEE ARTHROPLASTY (Right) as a surgical intervention .  The patient's history has been reviewed, patient examined, no change in status, stable for surgery.  I have reviewed the patient's chart and labs.  Questions were answered to the patient's satisfaction.     [name]

## 2017-01-22 NOTE — Progress Notes (Signed)
Dr Green into see patient

## 2017-01-22 NOTE — Progress Notes (Signed)
Assisted Dr Greenwith right, ultrasound guided, adductor canal block. Side rails up, monitors on throughout procedure. See vital signs in flow sheet. Tolerated Procedure well.  

## 2017-01-22 NOTE — Op Note (Signed)
NAME:  [name]                      MEDICAL RECORD NO.:  [id number]                             FACILITY:  [facility]      PHYSICIAN:  Pietro Cassis. Alvan Dame, M.D.  DATE OF BIRTH:  [birth year]      DATE OF PROCEDURE:  01/22/2017                                     OPERATIVE REPORT         PREOPERATIVE DIAGNOSIS:  Right knee osteoarthritis.      POSTOPERATIVE DIAGNOSIS:  Right knee osteoarthritis.      FINDINGS:  The patient was noted to have complete loss of cartilage and   bone-on-bone arthritis with associated osteophytes in the lateral and patellofemoral compartments of   the knee with a significant synovitis and associated effusion.      PROCEDURE:  Right total knee replacement.      COMPONENTS USED:  DePuy Attune rotating platform posterior stabilized knee   system, a size 6N femur, 6 tibia, size 5 PS AOX insert, and 38 anatomic patellar   button.      SURGEON:  Pietro Cassis. Alvan Dame, M.D.      ASSISTANT:  Danae Orleans, PA-C.      ANESTHESIA:  Regional and Spinal.      SPECIMENS:  None.      COMPLICATION:  None.      DRAINS:  None.  EBL: <150cc      TOURNIQUET TIME:   Total Tourniquet Time Documented: Thigh (Right) - 37 minutes Total: Thigh (Right) - 37 minutes  .      The patient was stable to the recovery room.      INDICATION FOR PROCEDURE:  [name] is a 71 y.o. male patient of   mine.  The patient had been seen, evaluated, and treated conservatively in the   office with medication, activity modification, and injections.  The patient had   radiographic changes of bone-on-bone arthritis with endplate sclerosis and osteophytes noted.      The patient failed conservative measures including medication, injections, and activity modification, and at this point was ready for more definitive measures.   Based on the radiographic changes and failed conservative measures, the patient   decided to proceed with total knee replacement.  Risks of infection,   DVT, component  failure, need for revision surgery, postop course, and   expectations were all   discussed and reviewed.  Consent was obtained for benefit of pain   relief.      PROCEDURE IN DETAIL:  The patient was brought to the operative theater.   Once adequate anesthesia, preoperative antibiotics, 2 gm of Ancef, 10 mg of Decadron (2 gm of TXA administered topically) administered, the patient was positioned supine with the right thigh tourniquet placed.  The  right lower extremity was prepped and draped in sterile fashion.  A time-   out was performed identifying the patient, planned procedure, and   extremity.      The right lower extremity was placed in the [facility] leg holder.  The leg was   exsanguinated, tourniquet elevated to 250 mmHg.  A midline incision was  made followed by median parapatellar arthrotomy.  Following initial   exposure, attention was first directed to the patella.  Precut   measurement was noted to be 25 mm.  I resected down to 14-15 mm and used a   38 anatomic patellar button to restore patellar height as well as cover the cut   surface.      The lug holes were drilled and a metal shim was placed to protect the   patella from retractors and saw blades.      At this point, attention was now directed to the femur.  The femoral   canal was opened with a drill, irrigated to try to prevent fat emboli.  An   intramedullary rod was passed at 5 degrees valgus, 11 mm of bone was   resected off the distal femur.  Following this resection, the tibia was   subluxated anteriorly.  Using the extramedullary guide, 4 mm of bone was resected off   the proximal lateral tibia.  We confirmed the gap would be   stable medially and laterally with a size 5 spacer block as well as confirmed   the cut was perpendicular in the coronal plane, checking with an alignment rod.      Once this was done, I sized the femur to be a size 6 in the anterior-   posterior dimension, chose a narrow component  based on medial and   lateral dimension.  The size 6 rotation block was then pinned in   position anterior referenced using the C-clamp to set rotation.  The   anterior, posterior, and  chamfer cuts were made without difficulty nor   notching making certain that I was along the anterior cortex to help   with flexion gap stability.      The final box cut was made off the lateral aspect of distal femur.      At this point, the tibia was sized to be a size 6, the size 6 tray was   then pinned in position through the medial third of the tubercle,   drilled, and keel punched.  Trial reduction was now carried with a 6 femur,  6 tibia, a size 5 PS insert, and the 38 aantomic patella botton.  The knee was brought to   extension, full extension with good flexion stability with the patella   tracking through the trochlea without application of pressure.  Given   all these findings the femoral lug holes were drilled and then the trial components removed.  Final components were   opened and cement was mixed.  The knee was irrigated with normal saline   solution and pulse lavage.  The synovial lining was   then injected with 20 cc 0.25% Marcaine with epinephrine and 1 cc of Toradol plus 30 cc of NS for a total of 61 cc.      The knee was irrigated.  Final implants were then cemented onto clean and   dried cut surfaces of bone with the knee brought to extension with a size 5 PS trial insert.      Once the cement had fully cured, the excess cement was removed   throughout the knee.  I confirmed I was satisfied with the range of   motion and stability, and the final size 5 PS AOX insert was chosen.  It was   placed into the knee.      The tourniquet had been let down at 38 minutes.  No  significant   hemostasis required.  The   extensor mechanism was then reapproximated using #1 Vicryl and #0 V-lock sutures with the knee   in flexion.  The   remaining wound was closed with 2-0 Vicryl and running 4-0  Monocryl.   The knee was cleaned, dried, dressed sterilely using Dermabond and   Aquacel dressing.  The patient was then   brought to recovery room in stable condition, tolerating the procedure   well.   Please note that Physician Assistant, Danae Orleans, PA-C, was present for the entirety of the case, and was utilized for pre-operative positioning, peri-operative retractor management, general facilitation of the procedure.  He was also utilized for primary wound closure at the end of the case.              Pietro Cassis Alvan Dame, M.D.    01/22/2017 11:58 AM

## 2017-01-22 NOTE — Anesthesia Procedure Notes (Signed)
Spinal  Patient location during procedure: OR Start time: 01/22/2017 10:05 AM End time: 01/22/2017 12:05 PM Reason for block: at surgeon's request Staffing Anesthesiologist: Belinda Block Performed: anesthesiologist  Preanesthetic Checklist Completed: patient identified, site marked, surgical consent, pre-op evaluation, timeout performed, IV checked, risks and benefits discussed and monitors and equipment checked Spinal Block Patient position: sitting Location: L3-4 Injection technique: single-shot Needle Needle gauge: 22 G Assessment Sensory level: T12 Additional Notes Spinal Marcaine 13mg .Expiration date noted. Neg asp Pt tolerated procedure well

## 2017-01-22 NOTE — Discharge Instructions (Signed)

## 2017-01-22 NOTE — Anesthesia Procedure Notes (Signed)
Procedure Name: MAC Date/Time: 01/22/2017 10:15 AM Performed by: Dione Booze Pre-anesthesia Checklist: Patient identified, Emergency Drugs available, Patient being monitored and Suction available Oxygen Delivery Method: Simple face mask Placement Confirmation: positive ETCO2

## 2017-01-22 NOTE — Anesthesia Postprocedure Evaluation (Signed)
Anesthesia Post Note  Patient: [name]  Procedure(s) Performed: Procedure(s) (LRB): RIGHT TOTAL KNEE ARTHROPLASTY (Right)  Patient location during evaluation: PACU Anesthesia Type: Spinal Level of consciousness: awake Pain management: pain level controlled Respiratory status: spontaneous breathing Cardiovascular status: stable Postop Assessment: spinal receding Anesthetic complications: no       Last Vitals:  Vitals:   01/22/17 1500 01/22/17 1521  BP: (!) 170/97 135/84  Pulse: 78 76  Resp: 19 20  Temp: 36.3 C 36.8 C    Last Pain:  Vitals:   01/22/17 1500  TempSrc:   PainSc: 2                  Eamon Tantillo

## 2017-01-22 NOTE — Transfer of Care (Signed)
Immediate Anesthesia Transfer of Care Note  Patient: [name]  Procedure(s) Performed: Procedure(s): RIGHT TOTAL KNEE ARTHROPLASTY (Right)  Patient Location: PACU  Anesthesia Type:MAC, Regional and Spinal  Level of Consciousness: awake and patient cooperative  Airway & Oxygen Therapy: Patient Spontanous Breathing and Patient connected to face mask oxygen  Post-op Assessment: Report given to RN and Post -op Vital signs reviewed and stable  Post vital signs: Reviewed and stable  Last Vitals:  Vitals:   01/22/17 1000 01/22/17 1001  BP:    Pulse: 64 65  Resp: (!) 21 14  Temp:      Last Pain:  Vitals:   01/22/17 0813  TempSrc:   PainSc: 4       Patients Stated Pain Goal: 3 (66/06/30 1601)  Complications: No apparent anesthesia complications

## 2017-01-22 NOTE — Anesthesia Preprocedure Evaluation (Addendum)
Anesthesia Evaluation  Patient identified by MRN, date of birth, ID band Patient awake    Reviewed: Allergy & Precautions, NPO status , Patient's Chart, lab work & pertinent test results  Airway Mallampati: II  TM Distance: <3 FB     Dental   Pulmonary sleep apnea ,    breath sounds clear to auscultation       Cardiovascular hypertension,  Rhythm:Regular Rate:Normal     Neuro/Psych    GI/Hepatic negative GI ROS, Neg liver ROS,   Endo/Other  diabetes  Renal/GU Renal disease     Musculoskeletal   Abdominal   Peds  Hematology  (+) anemia ,   Anesthesia Other Findings   Reproductive/Obstetrics                             Anesthesia Physical Anesthesia Plan  ASA: III  Anesthesia Plan: Spinal   Post-op Pain Management:  Regional for Post-op pain   Induction: Intravenous  Airway Management Planned: Simple Face Mask  Additional Equipment:   Intra-op Plan:   Post-operative Plan: Extubation in OR  Informed Consent: I have reviewed the patients History and Physical, chart, labs and discussed the procedure including the risks, benefits and alternatives for the proposed anesthesia with the patient or authorized representative who has indicated his/her understanding and acceptance.   Dental advisory given  Plan Discussed with: CRNA and Anesthesiologist  Anesthesia Plan Comments:        Anesthesia Quick Evaluation

## 2017-01-22 NOTE — Anesthesia Procedure Notes (Signed)
Anesthesia Regional Block: Adductor canal block   Pre-Anesthetic Checklist: ,, timeout performed, Correct Patient, Correct Site, Correct Laterality, Correct Procedure, Correct Position, site marked, Risks and benefits discussed,  Surgical consent,  Pre-op evaluation,  At surgeon's request and post-op pain management  Laterality: Right  Prep: chloraprep       Needles:  Injection technique: Single-shot  Needle Type: Stimulator Needle - 80          Additional Needles:   Procedures: Doppler guided, Ultrasound guided,,,,,,  Narrative:  Start time: 01/22/2017 9:40 AM End time: 01/22/2017 9:56 AM Injection made incrementally with aspirations every 5 mL.  Performed by: Personally  Anesthesiologist: Belinda Block

## 2017-01-23 DIAGNOSIS — M1711 Unilateral primary osteoarthritis, right knee: Secondary | ICD-10-CM | POA: Diagnosis not present

## 2017-01-23 LAB — CBC
HCT: 27.1 % — ABNORMAL LOW (ref 39.0–52.0)
Hemoglobin: 8.8 g/dL — ABNORMAL LOW (ref 13.0–17.0)
MCH: 26.7 pg (ref 26.0–34.0)
MCHC: 32.5 g/dL (ref 30.0–36.0)
MCV: 82.1 fL (ref 78.0–100.0)
PLATELETS: 201 10*3/uL (ref 150–400)
RBC: 3.3 MIL/uL — AB (ref 4.22–5.81)
RDW: 13 % (ref 11.5–15.5)
WBC: 10.9 10*3/uL — ABNORMAL HIGH (ref 4.0–10.5)

## 2017-01-23 LAB — BASIC METABOLIC PANEL
Anion gap: 8 (ref 5–15)
BUN: 27 mg/dL — AB (ref 6–20)
CALCIUM: 8.5 mg/dL — AB (ref 8.9–10.3)
CO2: 22 mmol/L (ref 22–32)
CREATININE: 1.47 mg/dL — AB (ref 0.61–1.24)
Chloride: 109 mmol/L (ref 101–111)
GFR calc Af Amer: 54 mL/min — ABNORMAL LOW (ref >60)
GFR, EST NON AFRICAN AMERICAN: 46 mL/min — AB (ref >60)
Glucose, Bld: 247 mg/dL — ABNORMAL HIGH (ref 65–99)
POTASSIUM: 4.2 mmol/L (ref 3.5–5.1)
SODIUM: 139 mmol/L (ref 135–145)

## 2017-01-23 LAB — GLUCOSE, CAPILLARY
GLUCOSE-CAPILLARY: 211 mg/dL — AB (ref 65–99)
GLUCOSE-CAPILLARY: 191 mg/dL — AB (ref 65–99)

## 2017-01-23 NOTE — Evaluation (Signed)
Physical Therapy Evaluation Patient Details Name: [name] MRN: [id number] DOB: 1946/04/06 Today's Date: 01/23/2017   History of Present Illness  pt was admitted for L TKA, h/o CVA, peripheral neuropathy, gout  Clinical Impression  Pt s/p L TKR and presents with decreased L LE strength/ROM and post op pain limiting functional mobility.  Pt should progress to dc home with family assist and plans follow up PT as out pt beginning 01/25/17    Follow Up Recommendations Outpatient PT (Pt states has appt 01/25/17)    Equipment Recommendations  Rolling walker with 5" wheels    Recommendations for Other Services OT consult     Precautions / Restrictions Precautions Precautions: Fall;Knee Restrictions Weight Bearing Restrictions: No Other Position/Activity Restrictions: WBAT      Mobility  Bed Mobility Overal bed mobility: Needs Assistance Bed Mobility: Supine to Sit     Supine to sit: Supervision     General bed mobility comments: cues for sequence and use of L LE to self assist  Transfers Overall transfer level: Needs assistance Equipment used: Rolling walker (2 wheeled) Transfers: Sit to/from Stand Sit to Stand: Min assist         General transfer comment: cues for LE management and use of UEs to self assist.    Ambulation/Gait Ambulation/Gait assistance: Min assist Ambulation Distance (Feet): 123 Feet Assistive device: Rolling walker (2 wheeled) Gait Pattern/deviations: Step-to pattern;Step-through pattern;Decreased step length - right;Decreased step length - left;Shuffle;Trunk flexed Gait velocity: decr Gait velocity interpretation: Below normal speed for age/gender General Gait Details: cues for sequence, posture, position from RW and to keep the RW on the floor.  Min assist for balance  Stairs            Wheelchair Mobility    Modified Rankin (Stroke Patients Only)       Balance Overall balance assessment: Needs assistance Sitting-balance  support: Feet supported;No upper extremity supported Sitting balance-Leahy Scale: Good     Standing balance support: No upper extremity supported Standing balance-Leahy Scale: Fair                               Pertinent Vitals/Pain Pain Assessment: 0-10 Pain Score: 4  Pain Location: R knee Pain Descriptors / Indicators: Sore Pain Intervention(s): Limited activity within patient's tolerance;Monitored during session;Premedicated before session;Ice applied    Home Living Family/patient expects to be discharged to:: Private residence Living Arrangements: Spouse/significant other Available Help at Discharge: Family;Available 24 hours/day Type of Home: House Home Access: Stairs to enter Entrance Stairs-Rails: None Entrance Stairs-Number of Steps: 3+1 Home Layout: Able to live on main level with bedroom/bathroom Home Equipment: Walker - 4 wheels;Cane - single point Additional Comments: Pt plans dc to son's home with level entry and all needs available on same level    Prior Function Level of Independence: Independent with assistive device(s)         Comments: uses cane or 4 wh RW as needed     Hand Dominance   Dominant Hand: Right    Extremity/Trunk Assessment   Upper Extremity Assessment Upper Extremity Assessment: Overall WFL for tasks assessed    Lower Extremity Assessment Lower Extremity Assessment: RLE deficits/detail RLE Deficits / Details: 3/5 quads with IND SLR and AAROM at knee -5 - 75       Communication   Communication: No difficulties  Cognition Arousal/Alertness: Awake/alert Behavior During Therapy: WFL for tasks assessed/performed Overall Cognitive Status: Within Functional Limits for  tasks assessed                 General Comments: very talkative    General Comments      Exercises Total Joint Exercises Ankle Circles/Pumps: AROM;Both;15 reps;Supine Quad Sets: AROM;Both;10 reps;Supine Heel Slides: AAROM;Right;15  reps;Supine Straight Leg Raises: AAROM;AROM;Right;15 reps;Supine   Assessment/Plan    PT Assessment Patient needs continued PT services  PT Problem List Decreased strength;Decreased range of motion;Decreased balance;Decreased activity tolerance;Decreased mobility;Decreased knowledge of use of DME;Pain       PT Treatment Interventions DME instruction;Gait training;Stair training;Functional mobility training;Therapeutic activities;Therapeutic exercise;Patient/family education    PT Goals (Current goals can be found in the Care Plan section)  Acute Rehab PT Goals Patient Stated Goal: return to independence PT Goal Formulation: With patient Time For Goal Achievement: 01/24/17 Potential to Achieve Goals: Good    Frequency 7X/week   Barriers to discharge        Co-evaluation               End of Session Equipment Utilized During Treatment: Gait belt Activity Tolerance: Patient tolerated treatment well Patient left: in chair;with call bell/phone within reach Nurse Communication: Mobility status PT Visit Diagnosis: Difficulty in walking, not elsewhere classified (R26.2)    Functional Assessment Tool Used: Clinical judgement Functional Limitation: Mobility: Walking and moving around Mobility: Walking and Moving Around Current Status (E5631): At least 20 percent but less than 40 percent impaired, limited or restricted Mobility: Walking and Moving Around Goal Status [phone]): At least 1 percent but less than 20 percent impaired, limited or restricted    Time: 0825-0912 PT Time Calculation (min) (ACUTE ONLY): 47 min   Charges:   PT Evaluation $PT Eval Low Complexity: 1 Procedure PT Treatments $Gait Training: 8-22 mins $Therapeutic Exercise: 8-22 mins   PT G Codes:   PT G-Codes **NOT FOR INPATIENT CLASS** Functional Assessment Tool Used: Clinical judgement Functional Limitation: Mobility: Walking and moving around Mobility: Walking and Moving Around Current Status  (O3785): At least 20 percent but less than 40 percent impaired, limited or restricted Mobility: Walking and Moving Around Goal Status [phone]): At least 1 percent but less than 20 percent impaired, limited or restricted     [name] 01/23/2017, 10:33 AM

## 2017-01-23 NOTE — Care Management Note (Signed)
Case Management Note  Patient Details  Name: [name] MRN: [id number] Date of Birth: 12-03-1945  Subjective/Objective:                  RIGHT TOTAL KNEE ARTHROPLASTY (Right Action/Plan:  Expected Discharge Date:  01/23/17               Expected Discharge Plan:  Home/Self Care  In-House Referral:     Discharge planning Services  CM Consult  Post Acute Care Choice:  Durable Medical Equipment Choice offered to:  Patient  DME Arranged: RW, tub Transfer bench DME Agency:  Other - Comment  HH Arranged:  NA HH Agency:  NA  Status of Service:  Completed, signed off  If discussed at [place] of Stay Meetings, dates discussed:    Additional Comments: CM spoke with Luz [facility] [place] [phone] who then requested I fax orders to [place] [facility] [phone]; CM faxed facesheet, orders, OP note and [facility]; [facility] and verbalized understanding pt will be discharged today.  CM received confirmation of receipt. Dellie Catholic, RN 01/23/2017, 1:53 PM

## 2017-01-23 NOTE — Evaluation (Addendum)
Occupational Therapy Evaluation Patient Details Name: [name] MRN: [id number] DOB: December 23, 1945 Today's Date: 01/23/2017    History of Present Illness pt was admitted for L TKA, h/o CVA, peripheral neuropathy, gout   Clinical Impression   This 71 year old man was admitted for the above sx.  He will benefit from one more session of OT prior to admission to continue education on safe bathroom transfers.  Goals are for supervision to min A level    Follow Up Recommendations  No OT follow up;Supervision/Assistance - 24 hour    Equipment Recommendations  3 in 1 bedside commode (? tub bench, if [facility] will [facility] this)    Recommendations for Other Services       Precautions / Restrictions Precautions Precautions: Fall;Knee Restrictions Weight Bearing Restrictions: No      Mobility Bed Mobility                  Transfers Overall transfer level: Needs assistance Equipment used: Rolling walker (2 wheeled) Transfers: Sit to/from Stand Sit to Stand: Min assist;Min guard         General transfer comment: steadying assistance from chair; cues for UE/LE placement    Balance                                            ADL Overall ADL's : Needs assistance/impaired     Grooming: Supervision/safety;Wash/dry hands;Standing   Upper Body Bathing: Set up;Sitting   Lower Body Bathing: Minimal assistance;Sit to/from stand   Upper Body Dressing : Set up;Sitting   Lower Body Dressing: Minimal assistance;Sit to/from stand;With adaptive equipment   Toilet Transfer: Minimal assistance;BSC;RW;Ambulation   Toileting- Water quality scientist and Hygiene: Min guard;Sit to/from stand         General ADL Comments: Min A ambulating to bathroom, cues to side step.  Pt picked walker up a couple of times and was a little unsteady. Cues for safety. He has a sock aide, but not like the one I carry.  Reviewed the use of this, in case he can get a different model (his was  issued through [place])     Vision         Perception     Praxis      Pertinent Vitals/Pain Pain Assessment: 0-10 Pain Score: 4  Pain Location: R knee Pain Descriptors / Indicators: Sore Pain Intervention(s): Limited activity within patient's tolerance;Monitored during session;Premedicated before session;Repositioned;Ice applied     Hand Dominance     Extremity/Trunk Assessment Upper Extremity Assessment Upper Extremity Assessment: Overall WFL for tasks assessed (RUE sore; arthritis)           Communication Communication Communication: No difficulties   Cognition Arousal/Alertness: Awake/alert Behavior During Therapy: WFL for tasks assessed/performed Overall Cognitive Status: Within Functional Limits for tasks assessed                 General Comments: very talkative   General Comments       Exercises       Shoulder Instructions      Home Living Family/patient expects to be discharged to:: Private residence Living Arrangements: Spouse/significant other Available Help at Discharge: Family;Available 24 hours/day Type of Home: House             Bathroom Shower/Tub: Tub/shower unit Shower/tub characteristics: Curtain Biochemist, clinical: Standard     Home Equipment: Environmental consultant - 4  wheels          Prior Functioning/Environment Level of Independence: Independent with assistive device(s)                 OT Problem List: Decreased activity tolerance;Pain;Decreased safety awareness;Decreased knowledge of use of DME or AE      OT Treatment/Interventions: Self-care/ADL training;Patient/family education;DME and/or AE instruction    OT Goals(Current goals can be found in the care plan section) Acute Rehab OT Goals Patient Stated Goal: return to independence OT Goal Formulation: With patient Time For Goal Achievement: 01/30/17 Potential to Achieve Goals: Good ADL Goals Pt Will Transfer to Toilet: with supervision;ambulating;bedside commode Pt Will  Perform Tub/Shower Transfer: Tub transfer;with min assist;ambulating;tub bench  OT Frequency: Min 2X/week   Barriers to D/C:            Co-evaluation              End of Session    Activity Tolerance: Patient tolerated treatment well Patient left: in chair;with call bell/phone within reach  OT Visit Diagnosis: Unsteadiness on feet (R26.81);Pain Pain - Right/Left: Left Pain - part of body: Knee                ADL either performed or assessed with clinical judgement  Time: 0923-0947 OT Time Calculation (min): 24 min Charges:  OT General Charges $OT Visit: 1 Procedure OT Evaluation $OT Eval Low Complexity: 1 Procedure G-Codes: OT G-codes **NOT FOR INPATIENT CLASS** Functional Assessment Tool Used: AM-PAC 6 Clicks Daily Activity;Clinical judgement Functional Limitation: Self care Self Care Current Status (Q7622): At least 20 percent but less than 40 percent impaired, limited or restricted Self Care Goal Status (Q3335): At least 20 percent but less than 40 percent impaired, limited or restricted   [name], OTR/L [phone] 01/23/2017  [place] 01/23/2017, 10:22 AM

## 2017-01-23 NOTE — Progress Notes (Signed)
Patient ID: [name], male   DOB: [birth year], 71 y.o.   MRN: [id number] Subjective: 1 Day Post-Op Procedure(s) (LRB): RIGHT TOTAL KNEE ARTHROPLASTY (Right)    Patient reports pain as mild.  No events. Complains of a little heel soreness but heel is elevated off bed this am  Objective:   VITALS:   Vitals:   01/23/17 0529 01/23/17 0635  BP: 133/71   Pulse: 63 74  Resp: 18 18  Temp: 98 F (36.7 C)     Neurovascular intact Incision: dressing C/D/I  LABS  Recent Labs  01/23/17 0430  HGB 8.8*  HCT 27.1*  WBC 10.9*  PLT 201     Recent Labs  01/23/17 0430  NA 139  K 4.2  BUN 27*  CREATININE 1.47*  GLUCOSE 247*    No results for input(s): LABPT, INR in the last 72 hours.   Assessment/Plan: 1 Day Post-Op Procedure(s) (LRB): RIGHT TOTAL KNEE ARTHROPLASTY (Right)   Advance diet Up with therapy   Home today after therapy RTC in 2 weeks Reviewed goals  And timing to get his other knee replaced

## 2017-01-23 NOTE — Progress Notes (Signed)
Physical Therapy Treatment Patient Details Name: [name] MRN: [id number] DOB: 1946-04-22 Today's Date: 01/23/2017    History of Present Illness pt was admitted for L TKA, h/o CVA, peripheral neuropathy, gout    PT Comments    Pt progressing well with mobility and eager for dc home.  Reviewed home therex program with pt and spouse with written instruction provided.   Follow Up Recommendations  Outpatient PT     Equipment Recommendations  Rolling walker with 5" wheels    Recommendations for Other Services OT consult     Precautions / Restrictions Precautions Precautions: Fall;Knee Restrictions Weight Bearing Restrictions: No Other Position/Activity Restrictions: WBAT    Mobility  Bed Mobility Overal bed mobility: Needs Assistance Bed Mobility: Supine to Sit;Sit to Supine     Supine to sit: Supervision Sit to supine: Supervision   General bed mobility comments: cues for sequence and use of L LE to self assist  Transfers Overall transfer level: Needs assistance Equipment used: Rolling walker (2 wheeled) Transfers: Sit to/from Stand Sit to Stand: Min guard;Supervision         General transfer comment: cues for LE management and use of UEs to self assist.    Ambulation/Gait Ambulation/Gait assistance: Min guard Ambulation Distance (Feet): 200 Feet Assistive device: Rolling walker (2 wheeled) Gait Pattern/deviations: Step-to pattern;Step-through pattern;Decreased step length - right;Decreased step length - left;Shuffle;Trunk flexed Gait velocity: decr Gait velocity interpretation: Below normal speed for age/gender General Gait Details: cues for sequence, posture, position from RW and to keep the RW on the floor.     Stairs            Wheelchair Mobility    Modified Rankin (Stroke Patients Only)       Balance Overall balance assessment: Needs assistance Sitting-balance support: Feet supported;No upper extremity supported Sitting  balance-Leahy Scale: Good     Standing balance support: No upper extremity supported Standing balance-Leahy Scale: Fair                      Cognition Arousal/Alertness: Awake/alert Behavior During Therapy: WFL for tasks assessed/performed Overall Cognitive Status: Within Functional Limits for tasks assessed                 General Comments: very talkative    Exercises Total Joint Exercises Ankle Circles/Pumps: AROM;Both;15 reps;Supine Quad Sets: AROM;Both;10 reps;Supine Heel Slides: AAROM;Right;Supine;20 reps (Pt self assisting with sheet) Straight Leg Raises: AAROM;AROM;Right;15 reps;Supine Knee Flexion: AROM;10 reps;Seated;Right    General Comments        Pertinent Vitals/Pain Pain Assessment: 0-10 Pain Score: 4  Pain Location: R knee Pain Descriptors / Indicators: Sore Pain Intervention(s): Limited activity within patient's tolerance;Monitored during session;Premedicated before session;Ice applied    Home Living                      Prior Function            PT Goals (current goals can now be found in the care plan section) Acute Rehab PT Goals Patient Stated Goal: return to independence PT Goal Formulation: With patient Time For Goal Achievement: 01/24/17 Potential to Achieve Goals: Good Progress towards PT goals: Progressing toward goals    Frequency    7X/week      PT Plan Current plan remains appropriate    Co-evaluation             End of Session Equipment Utilized During Treatment: Gait belt Activity Tolerance: Patient tolerated treatment  well Patient left: in bed;with call bell/phone within reach;with family/visitor present Nurse Communication: Mobility status PT Visit Diagnosis: Difficulty in walking, not elsewhere classified (R26.2)     Time: 1350-1435 PT Time Calculation (min) (ACUTE ONLY): 45 min  Charges:  $Gait Training: 8-22 mins $Therapeutic Exercise: 23-37 mins                    G Codes:        Elester Apodaca 2017/01/25, 2:50 PM

## 2017-01-23 NOTE — Progress Notes (Signed)
   01/23/17 1100  OT Visit Information  Last OT Received On 01/23/17  Assistance Needed +1  History of Present Illness pt was admitted for L TKA, h/o CVA, peripheral neuropathy, gout  Precautions  Precautions Fall;Knee  Pain Assessment  Pain Score 4  Pain Location R knee  Pain Descriptors / Indicators Sore  Pain Intervention(s) Limited activity within patient's tolerance;Monitored during session;Premedicated before session;Repositioned;Ice applied  Cognition  Arousal/Alertness Awake/alert  Behavior During Therapy WFL for tasks assessed/performed  Overall Cognitive Status Within Functional Limits for tasks assessed  ADL  Tub/ Shower Transfer Tub transfer;Minimal assistance;Tub bench;Ambulation  General ADL Comments brought tub bench and pt practiced transfer.  He verbalizes understanding of use.  Educated on either inexpensive shower curtain liner being cut and placed through panels or folding curtain under him to contain water  OT - End of Session  Activity Tolerance Patient tolerated treatment well  Patient left in chair;with call bell/phone within reach  OT Assessment/Plan  Follow Up Recommendations No OT follow up;Supervision/Assistance - 24 hour  OT Equipment 3 in 1 bedside commode;Tub/shower bench  AM-PAC OT "6 Clicks" Daily Activity Outcome Measure  Help from another person eating meals? 4  Help from another person taking care of personal grooming? 3  Help from another person toileting, which includes using toliet, bedpan, or urinal? 3  Help from another person bathing (including washing, rinsing, drying)? 3  Help from another person to put on and taking off regular upper body clothing? 3  Help from another person to put on and taking off regular lower body clothing? 3  6 Click Score 19  ADL G Code Conversion CK  OT Goal Progression  Progress towards OT goals Progressing toward goals (no further OT is needed)  OT Time Calculation  OT Start Time (ACUTE ONLY) 1109  OT Stop  Time (ACUTE ONLY) 1118  OT Time Calculation (min) 9 min  OT G-codes **NOT FOR INPATIENT CLASS**  Functional Assessment Tool Used AM-PAC 6 Clicks Daily Activity;Clinical judgement  Functional Limitation Self care  Self Care Discharge Status ([id number]) CJ  OT General Charges  $OT Visit 1 Procedure  OT Treatments  $Self Care/Home Management  8-22 mins  [name], OTR/L [phone] 01/23/2017

## 2017-01-30 NOTE — Discharge Summary (Signed)
Physician Discharge Summary   Patient ID: [name] MRN: [id number] DOB/AGE: 1946-03-11 71 y.o.  Admit date: 01/22/2017 Discharge date: 01/23/2017  Primary Diagnosis:  Right knee osteoarthritis.  Admission Diagnoses:  Past Medical History:  Diagnosis Date  . Agent Orange poisoning   . Anemia   . Diabetes mellitus    type 2   . Diverticulosis   . Gout   . History of nonmelanoma skin cancer   . Hypercholesterolemia   . Hypertension   . Prostate cancer ([place])   . Renal insufficiency   . Sleep apnea    has cpap but does not use   Discharge Diagnoses:   Active Problems:   S/P total knee replacement  Estimated body mass index is 34.87 kg/m as calculated from the following:   Height as of this encounter: '5\' 10"'$  (1.778 m).   Weight as of this encounter: 110.2 kg (243 lb).  Procedure:  Procedure(s) (LRB): RIGHT TOTAL KNEE ARTHROPLASTY (Right)   Consults: None  HPI: [name], 71 y.o. male, has a history of pain and functional disability in the right knee due to arthritis and has failed non-surgical conservative treatments for greater than 12 weeks to include NSAID's and/or analgesics, corticosteriod injections and activity modification.  Onset of symptoms was gradual, starting >10 years ago with gradually worsening course since that time. The patient noted prior procedures on the knee to include  arthroscopy on the right knee(s).  Patient currently rates pain in the right knee(s) at 9 out of 10 with activity. Patient has night pain, worsening of pain with activity and weight bearing, pain that interferes with activities of daily living, pain with passive range of motion, crepitus and joint swelling.  Patient has evidence of periarticular osteophytes and joint space narrowing by imaging studies.  There is no active infection.   Risks, benefits and expectations were discussed with the patient.  Risks including but not limited to the risk of anesthesia, blood clots, nerve damage,  blood vessel damage, failure of the prosthesis, infection and up to and including death.  Patient understand the risks, benefits and expectations and wishes to proceed with surgery.   Laboratory Data: Admission on 01/22/2017, Discharged on 01/23/2017  Component Date Value Ref Range Status  . Glucose-Capillary 01/22/2017 74  65 - 99 mg/dL Final  . Glucose-Capillary 01/22/2017 94  65 - 99 mg/dL Final  . WBC 01/23/2017 10.9* 4.0 - 10.5 K/uL Final  . RBC 01/23/2017 3.30* 4.22 - 5.81 MIL/uL Final  . Hemoglobin 01/23/2017 8.8* 13.0 - 17.0 g/dL Final  . HCT 01/23/2017 27.1* 39.0 - 52.0 % Final  . MCV 01/23/2017 82.1  78.0 - 100.0 fL Final  . MCH 01/23/2017 26.7  26.0 - 34.0 pg Final  . MCHC 01/23/2017 32.5  30.0 - 36.0 g/dL Final  . RDW 01/23/2017 13.0  11.5 - 15.5 % Final  . Platelets 01/23/2017 201  150 - 400 K/uL Final  . Sodium 01/23/2017 139  135 - 145 mmol/L Final  . Potassium 01/23/2017 4.2  3.5 - 5.1 mmol/L Final  . Chloride 01/23/2017 109  101 - 111 mmol/L Final  . CO2 01/23/2017 22  22 - 32 mmol/L Final  . Glucose, Bld 01/23/2017 247* 65 - 99 mg/dL Final  . BUN 01/23/2017 27* 6 - 20 mg/dL Final  . Creatinine, Ser 01/23/2017 1.47* 0.61 - 1.24 mg/dL Final  . Calcium 01/23/2017 8.5* 8.9 - 10.3 mg/dL Final  . GFR calc non Af Amer 01/23/2017 46* >60 mL/min  Final  . GFR calc Af Amer 01/23/2017 54* >60 mL/min Final   Comment: (NOTE) The eGFR has been calculated using the CKD EPI equation. This calculation has not been validated in all clinical situations. eGFR's persistently <60 mL/min signify possible Chronic Kidney Disease.   . Anion gap 01/23/2017 8  5 - 15 Final  . Glucose-Capillary 01/22/2017 271* 65 - 99 mg/dL Final  . Glucose-Capillary 01/22/2017 288* 65 - 99 mg/dL Final  . Glucose-Capillary 01/23/2017 211* 65 - 99 mg/dL Final  . Glucose-Capillary 01/23/2017 191* 65 - 99 mg/dL Final  Hospital Outpatient Visit on 01/14/2017  Component Date Value Ref Range Status  .  Glucose-Capillary 01/14/2017 95  65 - 99 mg/dL Final  . Hgb A1c MFr Bld 01/14/2017 7.7* 4.8 - 5.6 % Final   Comment: (NOTE)         Pre-diabetes: 5.7 - 6.4         Diabetes: >6.4         Glycemic control for adults with diabetes: <7.0   . Mean Plasma Glucose 01/14/2017 174  mg/dL Final   Comment: (NOTE) Performed At: [facility] [place], [place] [id number] Lindon Romp MD IN:[id number]   . Sodium 01/14/2017 144  135 - 145 mmol/L Final  . Potassium 01/14/2017 4.0  3.5 - 5.1 mmol/L Final  . Chloride 01/14/2017 112* 101 - 111 mmol/L Final  . CO2 01/14/2017 27  22 - 32 mmol/L Final  . Glucose, Bld 01/14/2017 97  65 - 99 mg/dL Final  . BUN 01/14/2017 16  6 - 20 mg/dL Final  . Creatinine, Ser 01/14/2017 1.38* 0.61 - 1.24 mg/dL Final  . Calcium 01/14/2017 9.2  8.9 - 10.3 mg/dL Final  . GFR calc non Af Amer 01/14/2017 50* >60 mL/min Final  . GFR calc Af Amer 01/14/2017 58* >60 mL/min Final   Comment: (NOTE) The eGFR has been calculated using the CKD EPI equation. This calculation has not been validated in all clinical situations. eGFR's persistently <60 mL/min signify possible Chronic Kidney Disease.   . Anion gap 01/14/2017 5  5 - 15 Final  . WBC 01/14/2017 8.2  4.0 - 10.5 K/uL Final  . RBC 01/14/2017 3.99* 4.22 - 5.81 MIL/uL Final  . Hemoglobin 01/14/2017 11.1* 13.0 - 17.0 g/dL Final  . HCT 01/14/2017 33.8* 39.0 - 52.0 % Final  . MCV 01/14/2017 84.7  78.0 - 100.0 fL Final  . MCH 01/14/2017 27.8  26.0 - 34.0 pg Final  . MCHC 01/14/2017 32.8  30.0 - 36.0 g/dL Final  . RDW 01/14/2017 13.4  11.5 - 15.5 % Final  . Platelets 01/14/2017 213  150 - 400 K/uL Final  . ABO/RH(D) 01/14/2017 B POS   Final  . Antibody Screen 01/14/2017 NEG   Final  . Sample Expiration 01/14/2017 01/25/2017   Final  . Extend sample reason 01/14/2017 NO TRANSFUSIONS OR PREGNANCY IN THE PAST 3 MONTHS   Final  . MRSA, PCR 01/14/2017 NEGATIVE  NEGATIVE Final  . Staphylococcus aureus  01/14/2017 NEGATIVE  NEGATIVE Final   Comment:        The Xpert SA Assay (FDA approved for NASAL specimens in patients over 56 years of age), is one component of a comprehensive surveillance program.  Test performance has been validated by [facility] for patients greater than or equal to 58 year old. It is not intended to diagnose infection nor to guide or monitor treatment.   . ABO/RH(D) 01/14/2017 B POS   Final  X-Rays:No results found.  EKG: Orders placed or performed during the hospital encounter of 01/14/17  . EKG 12 lead  . EKG 12 lead     Hospital Course: [name] is a 71 y.o. who was admitted to [facility]. They were brought to the operating room on 01/22/2017 and underwent Procedure(s): RIGHT TOTAL KNEE ARTHROPLASTY.  Patient tolerated the procedure well and was later transferred to the recovery room and then to the orthopaedic floor for postoperative care.  They were given PO and IV analgesics for pain control following their surgery.  They were given 24 hours of postoperative antibiotics of  Anti-infectives    Start     Dose/Rate Route Frequency Ordered Stop   01/22/17 1600  ceFAZolin (ANCEF) IVPB 2g/100 mL premix     2 g 200 mL/hr over 30 Minutes Intravenous Every 6 hours 01/22/17 1530 01/22/17 2303   01/22/17 0716  ceFAZolin (ANCEF) IVPB 2g/100 mL premix     2 g 200 mL/hr over 30 Minutes Intravenous On call to O.R. 01/22/17 6154 01/22/17 1021     and started on DVT prophylaxis in the form of Aspirin.   PT and OT were ordered for total joint protocol.  Discharge planning consulted to help with postop disposition and equipment needs.  Patient had a good night on the evening of surgery.  They started to get up OOB with therapy on day one. Patient was seen in rounds on day one by Dr. Charlann Boxer and was ready to go home.   Diet: Cardiac diet, Diabetic diet and Renal diet Activity:WBAT Follow-up:in 2 weeks Disposition - Home Discharged Condition:  good   Discharge Instructions    Call MD / Call 911    Complete by:  As directed    If you experience chest pain or shortness of breath, CALL 911 and be transported to the hospital emergency room.  If you develope a fever above 101 F, pus (white drainage) or increased drainage or redness at the wound, or calf pain, call your surgeon's office.   Change dressing    Complete by:  As directed    Change dressing daily with sterile 4 x 4 inch gauze dressing and apply TED hose. Do not submerge the incision under water.   Constipation Prevention    Complete by:  As directed    Drink plenty of fluids.  Prune juice may be helpful.  You may use a stool softener, such as Colace (over the counter) 100 mg twice a day.  Use MiraLax (over the counter) for constipation as needed.   Diet general    Complete by:  As directed    Discharge instructions    Complete by:  As directed    Pick up stool softner and laxative for home use following surgery while on pain medications. Do not submerge incision under water. Please use good hand washing techniques while changing dressing each day. May shower starting three days after surgery. Please use a clean towel to pat the incision dry following showers. Continue to use ice for pain and swelling after surgery. Do not use any lotions or creams on the incision until instructed by your surgeon.  Wear both TED hose on both legs during the day every day for three weeks, but may have off at night at home.  Postoperative Constipation Protocol  Constipation - defined medically as fewer than three stools per week and severe constipation as less than one stool per week.  One of the most  common issues patients have following surgery is constipation.  Even if you have a regular bowel pattern at home, your normal regimen is likely to be disrupted due to multiple reasons following surgery.  Combination of anesthesia, postoperative narcotics, change in appetite and fluid intake  all can affect your bowels.  In order to avoid complications following surgery, here are some recommendations in order to help you during your recovery period.  Colace (docusate) - Pick up an over-the-counter form of Colace or another stool softener and take twice a day as long as you are requiring postoperative pain medications.  Take with a full glass of water daily.  If you experience loose stools or diarrhea, hold the colace until you stool forms back up.  If your symptoms do not get better within 1 week or if they get worse, check with your doctor.  Dulcolax (bisacodyl) - Pick up over-the-counter and take as directed by the product packaging as needed to assist with the movement of your bowels.  Take with a full glass of water.  Use this product as needed if not relieved by Colace only.   MiraLax (polyethylene glycol) - Pick up over-the-counter to have on hand.  MiraLax is a solution that will increase the amount of water in your bowels to assist with bowel movements.  Take as directed and can mix with a glass of water, juice, soda, coffee, or tea.  Take if you go more than two days without a movement. Do not use MiraLax more than once per day. Call your doctor if you are still constipated or irregular after using this medication for 7 days in a row.  If you continue to have problems with postoperative constipation, please contact the office for further assistance and recommendations.  If you experience "the worst abdominal pain ever" or develop nausea or vomiting, please contact the office immediatly for further recommendations for treatment.   Do not put a pillow under the knee. Place it under the heel.    Complete by:  As directed    Do not sit on low chairs, stoools or toilet seats, as it may be difficult to get up from low surfaces    Complete by:  As directed    Driving restrictions    Complete by:  As directed    No driving until released by the physician.   Increase activity slowly as  tolerated    Complete by:  As directed    Lifting restrictions    Complete by:  As directed    No lifting until released by the physician.   Patient may shower    Complete by:  As directed    You may shower without a dressing once there is no drainage.  Do not wash over the wound.  If drainage remains, do not shower until drainage stops.   TED hose    Complete by:  As directed    Use stockings (TED hose) for 3 weeks on both leg(s).  You may remove them at night for sleeping.   Weight bearing as tolerated    Complete by:  As directed    Laterality:  right   Extremity:  Lower     Allergies as of 01/23/2017   No Known Allergies     Medication List    STOP taking these medications   aspirin 325 MG EC tablet   HYDROcodone-acetaminophen 5-325 MG tablet Commonly known as:  NORCO/VICODIN Replaced by:  HYDROcodone-acetaminophen 7.5-325 MG tablet  TAKE these medications   amitriptyline 50 MG tablet Commonly known as:  ELAVIL Take 50 mg by mouth at bedtime.   amLODipine 10 MG tablet Commonly known as:  NORVASC Take 10 mg by mouth at bedtime.   atorvastatin 20 MG tablet Commonly known as:  LIPITOR Take 20 mg by mouth at bedtime.   carvedilol 12.5 MG tablet Commonly known as:  COREG Take 12.5 mg by mouth 2 (two) times daily.   celecoxib 200 MG capsule Commonly known as:  CELEBREX Take 1 capsule (200 mg total) by mouth 2 (two) times daily.   colchicine 0.6 MG tablet Take 0.6 mg by mouth daily as needed (gout).   docusate sodium 100 MG capsule Commonly known as:  COLACE Take 1 capsule (100 mg total) by mouth 2 (two) times daily.   enoxaparin 40 MG/0.4ML injection Commonly known as:  LOVENOX Inject 0.4 mLs (40 mg total) into the skin daily.   EYE DROPS OP Place 3 drops into both eyes daily as needed (dry eyes).   ferrous sulfate 325 (65 FE) MG tablet Commonly known as:  FERROUSUL Take 1 tablet (325 mg total) by mouth 3 (three) times daily with meals.    HYDROcodone-acetaminophen 7.5-325 MG tablet Commonly known as:  NORCO Take 1-2 tablets by mouth every 4 (four) hours as needed for moderate pain or severe pain. Replaces:  HYDROcodone-acetaminophen 5-325 MG tablet   hydrocortisone 2.5 % cream Apply 1 application topically at bedtime.   insulin aspart 100 UNIT/ML injection Commonly known as:  novoLOG Inject 12 Units into the skin 3 (three) times daily before meals.   insulin glargine 100 UNIT/ML injection Commonly known as:  LANTUS Inject 50 Units into the skin at bedtime.   iron dextran complex in sodium chloride 0.9 % 500 mL Inject into the vein once.   lisinopril 10 MG tablet Commonly known as:  PRINIVIL,ZESTRIL Take 10 mg by mouth every evening.   methocarbamol 500 MG tablet Commonly known as:  ROBAXIN Take 1 tablet (500 mg total) by mouth every 6 (six) hours as needed for muscle spasms.   polyethylene glycol packet Commonly known as:  MIRALAX / GLYCOLAX Take 17 g by mouth 2 (two) times daily.   ULORIC 40 MG tablet Generic drug:  febuxostat Take 40 mg by mouth at bedtime.   VITAMIN B-6 PO Take 2 tablets by mouth every evening.      Follow-up Information    [name], MD. Schedule an appointment as soon as possible for a visit in 2 week(s).   Specialty:  Orthopedic Surgery Contact information: [address] [place] [address] [phone]        [facility]   Why:  Your equipment orders were faxed to [phone] and confirmation of receipt received; equipment with be delivered to your home Contact information: [phone] [place] with community services          Signed: Arlee Muslim, PA-C Orthopaedic Surgery 01/30/2017, 8:39 PM

## 2017-06-03 ENCOUNTER — Other Ambulatory Visit: Payer: Self-pay | Admitting: Urology

## 2017-06-03 DIAGNOSIS — C61 Malignant neoplasm of prostate: Secondary | ICD-10-CM

## 2017-06-03 NOTE — Anesthesia Postprocedure Evaluation (Signed)
Anesthesia Post Note  Patient: [name]  Procedure(s) Performed: Procedure(s) (LRB): RIGHT TOTAL KNEE ARTHROPLASTY (Right)     Anesthesia Post Evaluation  Last Vitals:  Vitals:   01/23/17 1034 01/23/17 1442  BP: (!) 113/44 124/65  Pulse: 83 81  Resp: 17 18  Temp: 36.9 C 36.5 C    Last Pain:  Vitals:   01/23/17 1730  TempSrc:   PainSc: 2                  Maggi Hershkowitz

## 2017-06-13 ENCOUNTER — Ambulatory Visit (HOSPITAL_COMMUNITY)
Admission: RE | Admit: 2017-06-13 | Discharge: 2017-06-13 | Disposition: A | Discharge status: Home / Self Care | Payer: Non-veteran care | Source: Ambulatory Visit | Attending: Urology | Admitting: Urology

## 2017-06-13 DIAGNOSIS — C61 Malignant neoplasm of prostate: Secondary | ICD-10-CM | POA: Diagnosis present

## 2017-06-13 LAB — POCT I-STAT CREATININE: CREATININE: 1.4 mg/dL — AB (ref 0.61–1.24)

## 2017-06-13 MED ORDER — LIDOCAINE HCL 2 % EX GEL
CUTANEOUS | Status: AC
  Filled 2017-06-13: qty 30

## 2017-06-13 MED ORDER — GADOBENATE DIMEGLUMINE 529 MG/ML IV SOLN
20.0000 mL | Freq: Once | INTRAVENOUS | Status: AC | PRN
  Administered 2017-06-13: 20 mL via INTRAVENOUS

## 2017-06-13 MED ORDER — LIDOCAINE HCL 2 % EX GEL: 1.0000 "application " | Freq: Once | CUTANEOUS | Status: DC

## 2018-01-27 ENCOUNTER — Encounter: Payer: Self-pay | Admitting: Gastroenterology

## 2018-02-12 ENCOUNTER — Encounter (HOSPITAL_COMMUNITY): Payer: Self-pay | Admitting: *Deleted

## 2018-02-12 ENCOUNTER — Emergency Department (HOSPITAL_COMMUNITY)
Admission: EM | Admit: 2018-02-12 | Discharge: 2018-02-13 | Disposition: A | Discharge status: Home / Self Care | Payer: Non-veteran care | Attending: Emergency Medicine | Admitting: Emergency Medicine

## 2018-02-12 ENCOUNTER — Emergency Department (HOSPITAL_COMMUNITY): Payer: Non-veteran care

## 2018-02-12 ENCOUNTER — Other Ambulatory Visit: Payer: Self-pay

## 2018-02-12 DIAGNOSIS — Z79899 Other long term (current) drug therapy: Secondary | ICD-10-CM | POA: Diagnosis not present

## 2018-02-12 DIAGNOSIS — Z794 Long term (current) use of insulin: Secondary | ICD-10-CM | POA: Diagnosis not present

## 2018-02-12 DIAGNOSIS — M545 Low back pain: Secondary | ICD-10-CM | POA: Diagnosis present

## 2018-02-12 DIAGNOSIS — E1122 Type 2 diabetes mellitus with diabetic chronic kidney disease: Secondary | ICD-10-CM | POA: Insufficient documentation

## 2018-02-12 DIAGNOSIS — I129 Hypertensive chronic kidney disease with stage 1 through stage 4 chronic kidney disease, or unspecified chronic kidney disease: Secondary | ICD-10-CM | POA: Diagnosis not present

## 2018-02-12 DIAGNOSIS — N189 Chronic kidney disease, unspecified: Secondary | ICD-10-CM | POA: Diagnosis not present

## 2018-02-12 DIAGNOSIS — Z85828 Personal history of other malignant neoplasm of skin: Secondary | ICD-10-CM | POA: Insufficient documentation

## 2018-02-12 DIAGNOSIS — M5441 Lumbago with sciatica, right side: Secondary | ICD-10-CM | POA: Diagnosis not present

## 2018-02-12 MED ORDER — OXYCODONE-ACETAMINOPHEN 5-325 MG PO TABS
1.0000 | ORAL_TABLET | Freq: Once | ORAL | Status: AC
  Administered 2018-02-12: 1 via ORAL
  Filled 2018-02-12: qty 1

## 2018-02-12 NOTE — ED Triage Notes (Signed)
Pt complains of right lower back pain radiating fown his right leg for the past 8 days. Pt states pain is worse with movement.

## 2018-02-12 NOTE — ED Provider Notes (Signed)
[place] DEPT Provider Note   CSN: [id number] Arrival date & time: 02/12/18  1807     History   Chief Complaint Chief Complaint  Patient presents with  . Back Pain    HPI [name] is a 72 y.o. male with past medical history significant for prostatic cancer, CKD, agent orange poisoning, diabetes, hypertension presenting with 8-10 days of right lower back pain radiating down his right lower extremity.  His pain is aggravated by movement.  Especially when attempting to sit up from bed .  He states that today he experienced more severe pain as he was attempting to get out of bed. The pain is sharp and shooting down his right lower extremity with movement. He reports improvement at rest. He does report wearing a wallet in that back pocket on the right.  Denies any fever, chills, recent weight loss, night sweats, loss of bowel or bladder function, numbness or weakness.  Patient reports neuropathy at baseline and does not experience any changes.  He denies any urinary symptoms.  Patient has a history of total hip and knee replacement on the right.  He denies any injury or trauma.  HPI  Past Medical History:  Diagnosis Date  . Agent Orange poisoning   . Anemia   . Diabetes mellitus    type 2   . Diverticulosis   . Gout   . History of nonmelanoma skin cancer   . Hypercholesterolemia   . Hypertension   . Prostate cancer ([place])   . Renal insufficiency   . Sleep apnea    has cpap but does not use    Patient Active Problem List   Diagnosis Date Noted  . 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 Cryptogenic stroke ([place]) 08/03/2014  . Gout 03/13/2014  . CVA (cerebral infarction) 03/12/2014  . Fever 09/18/2012  . DM (diabetes mellitus) ([place]) 09/18/2012  . CKD (chronic kidney disease) 09/18/2012  . Anemia 09/18/2012  . HTN (hypertension) 09/18/2012  . Hyperlipidemia 09/18/2012  . Anemia associated with chronic renal failure 10/18/2011    Past  Surgical History:  Procedure Laterality Date  . APPENDECTOMY    . eye lift     2003  . jaw replacement     cadaver bone   . MOHS SURGERY    . TOTAL HIP ARTHROPLASTY    . TOTAL KNEE ARTHROPLASTY Right 01/22/2017   Procedure: RIGHT TOTAL KNEE ARTHROPLASTY;  Surgeon: Paralee Cancel, MD;  Location: [facility];  Service: Orthopedics;  Laterality: Right;        Home Medications    Prior to Admission medications   Medication Sig Start Date End Date Taking? Authorizing Provider  amitriptyline (ELAVIL) 50 MG tablet Take 50 mg by mouth at bedtime.  10/06/11  Yes [provider]  amLODipine (NORVASC) 10 MG tablet Take 10 mg by mouth at bedtime.    Yes [provider]  atorvastatin (LIPITOR) 20 MG tablet Take 20 mg by mouth at bedtime.    Yes [provider]  buPROPion (WELLBUTRIN XL) 150 MG 24 hr tablet Take 150 mg by mouth daily.   Yes [provider]  carvedilol (COREG) 12.5 MG tablet Take 12.5 mg by mouth 2 (two) times daily.    Yes [provider]  colchicine 0.6 MG tablet Take 0.6 mg by mouth daily as needed (gout).    Yes [provider]  docusate sodium (COLACE) 100 MG capsule Take 1 capsule (100 mg total) by mouth 2 (  two) times daily. Patient taking differently: Take 100 mg by mouth daily as needed for mild constipation.  01/22/17  Yes Babish, Rodman Key, PA-C  febuxostat (ULORIC) 40 MG tablet Take 40 mg by mouth daily.   Yes [provider]  gabapentin (NEURONTIN) 300 MG capsule Take 600 mg by mouth at bedtime.   Yes [provider]  hydrocortisone 2.5 % cream Apply 1 application topically at bedtime.   Yes [provider]  insulin aspart (NOVOLOG) 100 UNIT/ML injection Inject 12 Units into the skin 3 (three) times daily before meals.   Yes [provider]  insulin glargine (LANTUS) 100 UNIT/ML injection Inject 50 Units into the skin daily.  09/22/12  Yes Viyuoh, Adeline C, MD  iron dextran complex in sodium  chloride 0.9 % 500 mL Inject into the vein once.   Yes [provider]  lisinopril (PRINIVIL,ZESTRIL) 10 MG tablet Take 10 mg by mouth every evening.   Yes [provider]  polyethylene glycol (MIRALAX / GLYCOLAX) packet Take 17 g by mouth 2 (two) times daily. Patient taking differently: Take 17 g by mouth daily as needed for mild constipation.  01/22/17  Yes Babish, Rodman Key, PA-C  Pyridoxine HCl (VITAMIN B-6 PO) Take 2 tablets by mouth daily.    Yes [provider]  Tetrahydrozoline HCl (EYE DROPS OP) Place 3 drops into both eyes daily as needed (dry eyes).   Yes [provider]  vitamin B-12 (CYANOCOBALAMIN) 1000 MCG tablet Take 1,000 mcg by mouth daily.   Yes [provider]  celecoxib (CELEBREX) 200 MG capsule Take 1 capsule (200 mg total) by mouth 2 (two) times daily. Patient not taking: Reported on 02/12/2018 01/22/17   Danae Orleans, PA-C  enoxaparin (LOVENOX) 40 MG/0.4ML injection Inject 0.4 mLs (40 mg total) into the skin daily. Patient not taking: Reported on 02/12/2018 01/22/17   Danae Orleans, PA-C  ferrous sulfate (FERROUSUL) 325 (65 FE) MG tablet Take 1 tablet (325 mg total) by mouth 3 (three) times daily with meals. Patient not taking: Reported on 02/12/2018 01/22/17   Danae Orleans, PA-C  HYDROcodone-acetaminophen (NORCO) 7.5-325 MG tablet Take 1-2 tablets by mouth every 4 (four) hours as needed for moderate pain or severe pain. Patient not taking: Reported on 02/12/2018 01/22/17   Danae Orleans, PA-C  Menthol, Topical Analgesic, (ICY HOT NATURALS) 7.5 % CREA Apply 1 application topically 2 (two) times daily as needed. 02/13/18   Emeline General, PA-C  methocarbamol (ROBAXIN) 500 MG tablet Take 1 tablet (500 mg total) by mouth at bedtime as needed. 02/13/18   Emeline General PA-C    Family History History reviewed. No pertinent family history.  Social History Social History   Tobacco Use  . Smoking status: Never Smoker  .  Smokeless tobacco: Never Used  Substance Use Topics  . Alcohol use: No  . Drug use: No     Allergies   Patient has no known allergies.   Review of Systems Review of Systems  Constitutional: Negative for activity change, appetite change, chills, diaphoresis, fatigue, fever and unexpected weight change.  Respiratory: Negative for cough and shortness of breath.   Cardiovascular: Negative for chest pain and palpitations.  Gastrointestinal: Negative for abdominal pain, nausea and vomiting.  Genitourinary: Negative for decreased urine volume, difficulty urinating, dysuria, flank pain and hematuria.  Musculoskeletal: Positive for back pain and myalgias. Negative for arthralgias, neck pain and neck stiffness.  Skin: Negative for color change, pallor and rash.  Neurological: Negative for seizures, syncope,  weakness and numbness.     Physical Exam Updated Vital Signs BP 136/71 (BP Location: Left Arm)   Pulse 68   Temp 98.1 F (36.7 C) (Oral)   Resp 15   Ht 5\' 10"  (1.778 m)   Wt 109.8 kg (242 lb)   SpO2 97%   BMI 34.72 kg/m   Physical Exam  Constitutional: He appears well-developed and well-nourished. No distress.  Patient is afebrile, nontoxic sitting comfortably in bed no acute distress.   HENT:  Head: Normocephalic and atraumatic.  Eyes: Conjunctivae are normal.  Neck: Neck supple.  Cardiovascular: Normal rate, regular rhythm, normal heart sounds and intact distal pulses.  No murmur heard. Pulmonary/Chest: Effort normal and breath sounds normal. No stridor. No respiratory distress. He has no wheezes. He has no rales.  Abdominal: He exhibits no distension. There is no tenderness.  Musculoskeletal: Normal range of motion. He exhibits tenderness. He exhibits no edema or deformity.  Patient was able to get out of bed and ambulate without assistance on my assessment. No midline tenderness palpation of the spine, no tenderness palpation of the hip.  Tender to palpation of the  right lower musculature and right buttocks.  Neurological: He is alert. No sensory deficit. He exhibits normal muscle tone.  5/5 strength in lower extremities bilaterally.  Strong tibial pulses.  Positive straight leg raise  Skin: Skin is warm and dry. No rash noted. He is not diaphoretic. No erythema. No pallor.  Psychiatric: He has a normal mood and affect.  Nursing note and vitals reviewed.    ED Treatments / Results  Labs (all labs ordered are listed, but only abnormal results are displayed) Labs Reviewed  URINALYSIS, ROUTINE W REFLEX MICROSCOPIC    EKG None  Radiology Dg Lumbar Spine Complete  Result Date: 02/12/2018 CLINICAL DATA:  72 year old male with lumbar back pain radiating to the right hip for over 1 week. No known injury. Symptoms increase with movement. EXAM: LUMBAR SPINE - COMPLETE 4+ VIEW COMPARISON:  CT Abdomen and Pelvis 09/17/2012. chest radiographs 08/18/2004. FINDINGS: Hypoplastic ribs suspected at T12 with a partially sacralized L5 level based on the comparison studies. Bone mineralization is within normal limits. Stable vertebral height and alignment since 2013. Bulky chronic flowing anterior endplate osteophytes in the lower thoracic and lumbar spine. Stable disc spaces. No pars fracture or significant facet hypertrophy. Chronic right total hip arthroplasty. No acute osseous abnormality identified. IMPRESSION: No acute osseous abnormality identified in the lumbar spine. Transitional, partially sacralized L5 level suspected. Electronically Signed   By: Genevie Ann M.D.   On: 02/12/2018 23:13   Dg Hip Unilat W Or Wo Pelvis 2-3 Views Right  Result Date: 02/12/2018 CLINICAL DATA:  72 year old male with lumbar back pain radiating to the right hip for over 1 week. No known injury. Symptoms increase with movement. EXAM: DG HIP (WITH OR WITHOUT PELVIS) 2-3V RIGHT COMPARISON:  Lumbar radiographs today reported separately. CT Abdomen and Pelvis 09/17/2012. FINDINGS: Right total  hip arthroplasty hardware appears stable, intact, and normally aligned. The visible proximal right femur appears intact. The left femoral head is normally located in the left hip joint space is stable and normal for age. No pelvis fracture identified. Sacral ala and SI joints appear within normal limits. Negative visible bowel gas pattern. IMPRESSION: Chronic right hip arthroplasty appears stable since 2013. No acute osseous abnormality identified. Electronically Signed   By: Genevie Ann M.D.   On: 02/12/2018 23:14    Procedures Procedures (including critical care time)  Medications Ordered in ED Medications  oxyCODONE-acetaminophen (PERCOCET/ROXICET) 5-325 MG per tablet 1 tablet (1 tablet Oral Given 02/12/18 2239)     Initial Impression / Assessment and Plan / ED Course  I have reviewed the triage vital signs and the nursing notes.  Pertinent labs & imaging results that were available during my care of the patient were reviewed by me and considered in my medical decision making (see chart for details).  Clinical Course as of Feb 13 105  Wed Feb 13, 1640  2741 72 year old male with prior history of prostate cancer here with atraumatic right-sided radicular pain that has going on about 10 days.  Is not associate with any abdominal pain and no urinary symptoms.  He is otherwise nontoxic-appearing.  He is getting some plain film x-rays and the likely need outpatient follow-up and possibly an MRI.   [MB]    Clinical Course User Index [MB] Hayden Rasmussen, MD    Patient presents with lower back pain.  No gross neurological deficits and normal neuro exam.  Patient has no gait abnormality or concern for cauda equina.  No loss of bowel or bladder control, fever, night sweats, weight loss, or IVDU.   He does have history of prostatic cancer.  His symptoms are consistent with sciatica.  Will discharge patient home with symptomatic relief and close follow up with PCP for outpatient imaging as  indicated.  RICE protocol and pain medications indicated and discussed with patient.   Discussed strict return precautions and advised to return to the emergency department if experiencing any new or worsening symptoms. Instructions were understood and patient agreed with discharge plan. Final Clinical Impressions(s) / ED Diagnoses   Final diagnoses:  Acute right-sided low back pain with right-sided sciatica    ED Discharge Orders        Ordered    methocarbamol (ROBAXIN) 500 MG tablet  At bedtime PRN     02/13/18 0105    Menthol, Topical Analgesic, (ICY HOT NATURALS) 7.5 % CREA  2 times daily PRN     02/13/18 0105       Emeline General, PA-C 02/13/18 0118    Hayden Rasmussen, MD 02/13/18 1218

## 2018-02-13 LAB — URINALYSIS, ROUTINE W REFLEX MICROSCOPIC
BILIRUBIN URINE: NEGATIVE
GLUCOSE, UA: NEGATIVE mg/dL
HGB URINE DIPSTICK: NEGATIVE
KETONES UR: NEGATIVE mg/dL
Leukocytes, UA: NEGATIVE
Nitrite: NEGATIVE
PH: 5 (ref 5.0–8.0)
Protein, ur: NEGATIVE mg/dL
Specific Gravity, Urine: 1.013 (ref 1.005–1.030)

## 2018-02-13 MED ORDER — MENTHOL (TOPICAL ANALGESIC) 7.5 % EX CREA: 1.0000 "application " | TOPICAL_CREAM | Freq: Two times a day (BID) | CUTANEOUS | 1 refills | Status: DC | PRN

## 2018-02-13 MED ORDER — METHOCARBAMOL 500 MG PO TABS: 500.0000 mg | ORAL_TABLET | Freq: Every evening | ORAL | 0 refills | Status: DC | PRN

## 2018-02-13 NOTE — Discharge Instructions (Signed)
As discussed, follow the instructions provided for activity modification. Ice before activity and heat may be helpful. Massage and muscle creams may also be helpful. Wear your wallet in a front pocket to avoid pressure to the area. Do not sit for prolonged periods of time. The medication prescribed can help with muscle spasm but should not be taken if driving or operating machinery. Follow up with your primary care provider. Return to the emergency department if symptoms worsen, loss of bowel or bladder function, numbness or weakness, fever or other new concerning symptoms in the meantime.

## 2018-02-24 ENCOUNTER — Other Ambulatory Visit (INDEPENDENT_AMBULATORY_CARE_PROVIDER_SITE_OTHER): Payer: No Typology Code available for payment source

## 2018-02-24 ENCOUNTER — Encounter: Payer: Self-pay | Admitting: Gastroenterology

## 2018-02-24 ENCOUNTER — Ambulatory Visit (INDEPENDENT_AMBULATORY_CARE_PROVIDER_SITE_OTHER): Payer: No Typology Code available for payment source | Admitting: Gastroenterology

## 2018-02-24 VITALS — BP 134/70 | HR 84 | Ht 67.75 in | Wt 244.1 lb

## 2018-02-24 DIAGNOSIS — D509 Iron deficiency anemia, unspecified: Secondary | ICD-10-CM

## 2018-02-24 DIAGNOSIS — R195 Other fecal abnormalities: Secondary | ICD-10-CM

## 2018-02-24 LAB — IGA: IGA: 436 mg/dL — AB (ref 68–378)

## 2018-02-24 MED ORDER — SUPREP BOWEL PREP KIT 17.5-3.13-1.6 GM/177ML PO SOLN: 1.0000 | ORAL | 0 refills | Status: DC

## 2018-02-24 NOTE — Addendum Note (Signed)
Addended by: Karena Addison on: 02/24/2018 11:06 AM   Modules accepted: Orders

## 2018-02-24 NOTE — Patient Instructions (Signed)
Your provider has requested that you go to the basement level for lab work before leaving today. Press "B" on the elevator. The lab is located at the first door on the left as you exit the elevator.    _  _   ORAL DIABETIC MEDICATION INSTRUCTIONS  The day before your procedure:  Take your diabetic pill as you do normally  The day of your procedure:  Do not take your diabetic pill   We will check your blood sugar levels during the admission process and again in Recovery before discharging you home  ______________________________________________________________________  _  _   INSULIN (LONG ACTING) MEDICATION INSTRUCTIONS (Lantus, NPH, 70/30, Humulin, Novolin-N, Levemir, Toujeo )   The day before your procedure:  Take  your regular evening dose    The day of your procedure:  Do not take your morning dose   _  _   INSULIN (SHORT ACTING) MEDICATION INSTRUCTIONS (Regular, Humulog, Novolog, Apidra, Novolin, Humulin)   The day before your procedure:  Do not take your evening dose   The day of your procedure:  Do not take your morning dose  ______________________________________________________________________  You have been scheduled for a colonoscopy. Please follow written instructions given to you at your visit today.  Please pick up your prep supplies at the pharmacy within the next 1-3 days. If you use inhalers (even only as needed), please bring them with you on the day of your procedure. Your physician has requested that you go to www.startemmi.com and enter the access code given to you at your visit today. This web site gives a general overview about your procedure. However, you should still follow specific instructions given to you by our office regarding your preparation for the procedure.

## 2018-02-24 NOTE — Progress Notes (Signed)
History of Present Illness: This is a 72 year old male referred by Bernell List, MD for the evaluation of FIT positive stool and chronic iron deficiency anemia.  He is accompanied by his wife.  Patient has been evaluated by hematology for iron deficiency.  Most recent hemoglobin was 11.3.  He states he has had frequent iron infusions.  No prior colonoscopy.  He is followed at the [facility] and MAC sedation was felt to be necessary which apparently is not provided at the [facility] facility. Denies weight loss, abdominal pain, constipation, diarrhea, change in stool caliber, melena, hematochezia, nausea, vomiting, dysphagia, reflux symptoms, chest pain.   No Known Allergies Outpatient Medications Prior to Visit  Medication Sig Dispense Refill  . amitriptyline (ELAVIL) 50 MG tablet Take 50 mg by mouth at bedtime.     Marland Kitchen amLODipine (NORVASC) 10 MG tablet Take 10 mg by mouth at bedtime.     Marland Kitchen atorvastatin (LIPITOR) 20 MG tablet Take 20 mg by mouth at bedtime.     Marland Kitchen buPROPion (WELLBUTRIN XL) 150 MG 24 hr tablet Take 150 mg by mouth daily.    . carvedilol (COREG) 12.5 MG tablet Take 12.5 mg by mouth 2 (two) times daily.     . celecoxib (CELEBREX) 200 MG capsule Take 1 capsule (200 mg total) by mouth 2 (two) times daily. 60 capsule 0  . colchicine 0.6 MG tablet Take 0.6 mg by mouth daily as needed (gout).     Marland Kitchen docusate sodium (COLACE) 100 MG capsule Take 1 capsule (100 mg total) by mouth 2 (two) times daily. (Patient taking differently: Take 100 mg by mouth daily as needed for mild constipation. ) 10 capsule 0  . enoxaparin (LOVENOX) 40 MG/0.4ML injection Inject 0.4 mLs (40 mg total) into the skin daily. 14 Syringe 0  . febuxostat (ULORIC) 40 MG tablet Take 40 mg by mouth daily.    . ferrous sulfate (FERROUSUL) 325 (65 FE) MG tablet Take 1 tablet (325 mg total) by mouth 3 (three) times daily with meals.    . gabapentin (NEURONTIN) 300 MG capsule Take 600 mg by mouth at bedtime.    Marland Kitchen  HYDROcodone-acetaminophen (NORCO) 7.5-325 MG tablet Take 1-2 tablets by mouth every 4 (four) hours as needed for moderate pain or severe pain. 60 tablet 0  . hydrocortisone 2.5 % cream Apply 1 application topically at bedtime.    . insulin aspart (NOVOLOG) 100 UNIT/ML injection Inject 12 Units into the skin 3 (three) times daily before meals.    . insulin glargine (LANTUS) 100 UNIT/ML injection Inject 50 Units into the skin daily.     . iron dextran complex in sodium chloride 0.9 % 500 mL Inject into the vein once.    Marland Kitchen lisinopril (PRINIVIL,ZESTRIL) 10 MG tablet Take 10 mg by mouth every evening.    . Menthol, Topical Analgesic, (ICY HOT NATURALS) 7.5 % CREA Apply 1 application topically 2 (two) times daily as needed. 1 Tube 1  . methocarbamol (ROBAXIN) 500 MG tablet Take 1 tablet (500 mg total) by mouth at bedtime as needed. 20 tablet 0  . polyethylene glycol (MIRALAX / GLYCOLAX) packet Take 17 g by mouth 2 (two) times daily. (Patient taking differently: Take 17 g by mouth daily as needed for mild constipation. ) 14 each 0  . Pyridoxine HCl (VITAMIN B-6 PO) Take 2 tablets by mouth daily.     . Tetrahydrozoline HCl (EYE DROPS OP) Place 3 drops into both eyes daily as needed (  dry eyes).    . vitamin B-12 (CYANOCOBALAMIN) 1000 MCG tablet Take 1,000 mcg by mouth daily.     No facility-administered medications prior to visit.    Past Medical History:  Diagnosis Date  . Agent Orange poisoning   . Anemia   . Anxiety   . Arthritis   . Basal cell carcinoma    back, stomach and face  . Chronic kidney disease (CKD)   . Diabetes mellitus    type 2   . Diverticulosis   . DVT (deep venous thrombosis) ([place])   . Gout   . History of nonmelanoma skin cancer   . Hypercholesterolemia   . Hypertension   . Kidney stones   . Prostate cancer ([place])   . Renal insufficiency   . Sleep apnea    has cpap but does not use  . Stroke [facility])    Past Surgical History:  Procedure Laterality Date  . APPENDECTOMY     . Eye Lift     2003  . Jaw Replacement     cadaver bone   . MOHS SURGERY    . TOTAL HIP ARTHROPLASTY Right   . TOTAL KNEE ARTHROPLASTY Right 01/22/2017   Procedure: RIGHT TOTAL KNEE ARTHROPLASTY;  Surgeon: Paralee Cancel, MD;  Location: [facility];  Service: Orthopedics;  Laterality: Right;   Social History   Socioeconomic History  . Marital status: Married    Spouse name: Not on file  . Number of children: 2  . Years of education: Not on file  . Highest education level: Not on file  Occupational History  . Occupation: Event organiser  . Occupation: Cabin crew  Social Needs  . Financial resource strain: Not on file  . Food insecurity:    Worry: Not on file    Inability: Not on file  . Transportation needs:    Medical: Not on file    Non-medical: Not on file  Tobacco Use  . Smoking status: Never Smoker  . Smokeless tobacco: Never Used  Substance and Sexual Activity  . Alcohol use: No  . Drug use: No  . Sexual activity: Not on file  Lifestyle  . Physical activity:    Days per week: Not on file    Minutes per session: Not on file  . Stress: Not on file  Relationships  . Social connections:    Talks on phone: Not on file    Gets together: Not on file    Attends religious service: Not on file    Active member of club or organization: Not on file    Attends meetings of clubs or organizations: Not on file    Relationship status: Not on file  Other Topics Concern  . Not on file  Social History Narrative  . Not on file   Family History  Problem Relation Age of Onset  . Dementia Mother   . Hypertension Mother   . Diabetes Mother   . Heart attack Father 32  . Kidney cancer Brother        tumor removed  . Hypertension Brother   . Liver cancer Maternal Grandfather   . Testicular cancer Brother       Review of Systems: Pertinent positive and negative review of systems were noted in the above HPI section. All other review of systems were otherwise  negative.    Physical Exam: General: Well developed, well nourished, no acute distress Head: Normocephalic and atraumatic Eyes:  sclerae anicteric, EOMI Ears: Normal auditory acuity Mouth: No  deformity or lesions Neck: Supple, no masses or thyromegaly Lungs: Clear throughout to auscultation Heart: Regular rate and rhythm; no murmurs, rubs or bruits Abdomen: Soft, non tender and non distended. No masses, hepatosplenomegaly or hernias noted. Normal Bowel sounds Rectal: Deferred to colonoscopy Musculoskeletal: Symmetrical with no gross deformities  Skin: No lesions on visible extremities Pulses:  Normal pulses noted Extremities: No clubbing, cyanosis, edema or deformities noted Neurological: Alert oriented x 4, grossly nonfocal Cervical Nodes:  No significant cervical adenopathy Inguinal Nodes: No significant inguinal adenopathy Psychological:  Alert and cooperative. Normal mood and affect  Assessment and Recommendations:  1. FIT positive stool and chronic iron deficiency anemia.  Rule out celiac disease, colorectal neoplasms, AVMs, etc. IgA and tTG today. Schedule colonoscopy. The risks (including bleeding, perforation, infection, missed lesions, medication reactions and possible hospitalization or surgery if complications occur), benefits, and alternatives to colonoscopy with possible biopsy and possible polypectomy were discussed with the patient and they consent to proceed.     cc: Doree Fudge, MD

## 2018-02-24 NOTE — Addendum Note (Signed)
Addended by: Wyline Beady on: 02/24/2018 11:38 AM   Modules accepted: Orders

## 2018-02-25 LAB — TISSUE TRANSGLUTAMINASE, IGA: (TTG) AB, IGA: 1 U/mL

## 2018-03-19 ENCOUNTER — Ambulatory Visit (AMBULATORY_SURGERY_CENTER): Payer: Non-veteran care | Admitting: Gastroenterology

## 2018-03-19 ENCOUNTER — Encounter: Payer: Self-pay | Admitting: Gastroenterology

## 2018-03-19 ENCOUNTER — Other Ambulatory Visit: Payer: Self-pay

## 2018-03-19 VITALS — BP 156/75 | HR 80 | Temp 98.4°F | Resp 20 | Ht 67.75 in | Wt 244.0 lb

## 2018-03-19 DIAGNOSIS — R195 Other fecal abnormalities: Secondary | ICD-10-CM

## 2018-03-19 DIAGNOSIS — D509 Iron deficiency anemia, unspecified: Secondary | ICD-10-CM | POA: Diagnosis not present

## 2018-03-19 DIAGNOSIS — D123 Benign neoplasm of transverse colon: Secondary | ICD-10-CM

## 2018-03-19 MED ORDER — SODIUM CHLORIDE 0.9 % IV SOLN: 500.0000 mL | Freq: Once | INTRAVENOUS | Status: DC

## 2018-03-19 NOTE — Progress Notes (Signed)
Report given to PACU, vss 

## 2018-03-19 NOTE — Progress Notes (Signed)
Called to room to assist during endoscopic procedure.  Patient ID and intended procedure confirmed with present staff. Received instructions for my participation in the procedure from the performing physician.  

## 2018-03-19 NOTE — Op Note (Signed)
[facility] Patient Name: [name] Procedure Date: 03/19/2018 3:40 PM MRN: [id number] Endoscopist: Ladene Artist , MD Age: 72 Referring MD:  Date of Birth: 07/19/1946 Gender: Male Account #: [id number] Procedure:                Colonoscopy Indications:              Positive fecal immunochemical test, Iron deficiency                            anemia Medicines:                Monitored Anesthesia Care Procedure:                Pre-Anesthesia Assessment:                           - Prior to the procedure, a History and Physical                            was performed, and patient medications and                            allergies were reviewed. The patient's tolerance of                            previous anesthesia was also reviewed. The risks                            and benefits of the procedure and the sedation                            options and risks were discussed with the patient.                            All questions were answered, and informed consent                            was obtained. Prior Anticoagulants: The patient has                            taken no previous anticoagulant or antiplatelet                            agents. ASA Grade Assessment: III - A patient with                            severe systemic disease. After reviewing the risks                            and benefits, the patient was deemed in                            satisfactory condition to undergo the procedure.  After obtaining informed consent, the colonoscope                            was passed under direct vision. Throughout the                            procedure, the patient's blood pressure, pulse, and                            oxygen saturations were monitored continuously. The                            Colonoscope was introduced through the anus and                            advanced to the the cecum, identified by                             appendiceal orifice and ileocecal valve. The                            ileocecal valve, appendiceal orifice, and rectum                            were photographed. The quality of the bowel                            preparation was adequate to identify polyps 6 mm                            and larger in size after extensive lavage and                            suctioning. The colonoscopy was performed without                            difficulty. The patient tolerated the procedure                            well. Scope In: 3:49:04 PM Scope Out: 4:16:31 PM Scope Withdrawal Time: 0 hours 21 minutes 45 seconds  Total Procedure Duration: 0 hours 27 minutes 27 seconds  Findings:                 The perianal and digital rectal examinations were                            normal.                           A 8 mm polyp was found in the transverse colon. The                            polyp was sessile. The polyp was removed with a  cold snare. Resection and retrieval were complete.                            Persistent oozing noted. For hemostasis, two                            hemostatic clips were successfully placed (MR                            conditional). There was less bleeding, minimal                            bleeding at the end of the procedure.                           A 5 mm polyp was found in the transverse colon. The                            polyp was sessile. The polyp was removed with a                            cold snare. Resection and retrieval were complete.                           A few small-mouthed diverticula were found in the                            left colon.                           Internal hemorrhoids were found during                            retroflexion. The hemorrhoids were small and Grade                            I (internal hemorrhoids that do not prolapse).                           The exam  was otherwise without abnormality on                            direct and retroflexion views. Complications:            No immediate complications. Estimated blood loss:                            None. Estimated Blood Loss:     Estimated blood loss: none. Impression:               - One 8 mm polyp in the transverse colon, removed                            with a cold snare. Resected and retrieved. Clips                            (  MR conditional) were placed.                           - One 5 mm polyp in the transverse colon, removed                            with a cold snare. Resected and retrieved.                           - Internal hemorrhoids.                           - The examination was otherwise normal on direct                            and retroflexion views.                           - Diverticulosis in the left colon. Recommendation:           - Repeat colonoscopy in 5 years for surveillance if                            polyp(s) are precancerous with a more extensive                            bowel prep. If polyps are not precancerous no plans                            for future screening colonoscopy due to age.                           - Patient has a contact number available for                            emergencies. The signs and symptoms of potential                            delayed complications were discussed with the                            patient. Return to normal activities tomorrow.                            Written discharge instructions were provided to the                            patient.                           - Resume previous diet.                           - Continue present medications.                           -  Await pathology results.                           - No aspirin, ibuprofen, naproxen, or other                            non-steroidal anti-inflammatory drugs for 2 weeks                            after polyp  removal.                           - Return to PCP for onoing care - if occult GI                            blood loss or iron deficiency persistents consider                            EGD and capsule endoscopy Ladene Artist, MD 03/19/2018 4:24:28 PM This report has been signed electronically.

## 2018-03-19 NOTE — Patient Instructions (Signed)
**   Handouts given on polyps and diverticulosis ** Clip card given to patient. NO NSAIDS FOR TWO WEEKS! NO ASPIRIN, ALEVE, IBUPROFEN. TYLENOL IS OKAY!   YOU HAD AN ENDOSCOPIC PROCEDURE TODAY AT [facility]:   Refer to the procedure report that was given to you for any specific questions about what was found during the examination.  If the procedure report does not answer your questions, please call your gastroenterologist to clarify.  If you requested that your care partner not be given the details of your procedure findings, then the procedure report has been included in a sealed envelope for you to review at your convenience later.  YOU SHOULD EXPECT: Some feelings of bloating in the abdomen. Passage of more gas than usual.  Walking can help get rid of the air that was put into your GI tract during the procedure and reduce the bloating. If you had a lower endoscopy (such as a colonoscopy or flexible sigmoidoscopy) you may notice spotting of blood in your stool or on the toilet paper. If you underwent a bowel prep for your procedure, you may not have a normal bowel movement for a few days.  Please Note:  You might notice some irritation and congestion in your nose or some drainage.  This is from the oxygen used during your procedure.  There is no need for concern and it should clear up in a day or so.  SYMPTOMS TO REPORT IMMEDIATELY:   Following lower endoscopy (colonoscopy or flexible sigmoidoscopy):  Excessive amounts of blood in the stool  Significant tenderness or worsening of abdominal pains  Swelling of the abdomen that is new, acute  Fever of 100F or higher  For urgent or emergent issues, a gastroenterologist can be reached at any hour by calling [phone].   DIET:  We do recommend a small meal at first, but then you may proceed to your regular diet.  Drink plenty of fluids but you should avoid alcoholic beverages for 24 hours.  ACTIVITY:  You should plan to take  it easy for the rest of today and you should NOT DRIVE or use heavy machinery until tomorrow (because of the sedation medicines used during the test).    FOLLOW UP: Our staff will call the number listed on your records the next business day following your procedure to check on you and address any questions or concerns that you may have regarding the information given to you following your procedure. If we do not reach you, we will leave a message.  However, if you are feeling well and you are not experiencing any problems, there is no need to return our call.  We will assume that you have returned to your regular daily activities without incident.  If any biopsies were taken you will be contacted by phone or by letter within the next 1-3 weeks.  Please call us at [phone] if you have not heard about the biopsies in 3 weeks.    SIGNATURES/CONFIDENTIALITY: You and/or your care partner have signed paperwork which will be entered into your electronic medical record.  These signatures attest to the fact that that the information above on your After Visit Summary has been reviewed and is understood.  Full responsibility of the confidentiality of this discharge information lies with you and/or your care-partner.

## 2018-03-20 ENCOUNTER — Telehealth: Payer: Self-pay | Admitting: *Deleted

## 2018-03-20 NOTE — Telephone Encounter (Signed)
  Follow up Call-  Call back number 03/19/2018  Post procedure Call Back phone  # [phone]  Permission to leave phone message Yes  Some recent data might be hidden     Patient questions:  Message left to call us if necessary.

## 2018-03-20 NOTE — Telephone Encounter (Signed)
No answer, message left for the patient. 

## 2018-03-24 ENCOUNTER — Encounter: Payer: Non-veteran care | Admitting: Gastroenterology

## 2018-04-01 ENCOUNTER — Encounter: Payer: Self-pay | Admitting: Gastroenterology

## 2019-01-07 ENCOUNTER — Other Ambulatory Visit: Payer: Self-pay | Admitting: Urology

## 2019-01-07 ENCOUNTER — Other Ambulatory Visit (HOSPITAL_COMMUNITY): Payer: Self-pay | Admitting: Urology

## 2019-01-07 DIAGNOSIS — C61 Malignant neoplasm of prostate: Secondary | ICD-10-CM

## 2019-03-18 ENCOUNTER — Ambulatory Visit (HOSPITAL_COMMUNITY): Payer: Non-veteran care

## 2019-03-19 ENCOUNTER — Ambulatory Visit (HOSPITAL_COMMUNITY): Payer: Non-veteran care

## 2019-05-18 ENCOUNTER — Ambulatory Visit (HOSPITAL_COMMUNITY): Payer: Medicare Other

## 2019-08-17 ENCOUNTER — Ambulatory Visit (HOSPITAL_COMMUNITY)
Admission: RE | Admit: 2019-08-17 | Discharge: 2019-08-17 | Disposition: A | Discharge status: Home / Self Care | Payer: No Typology Code available for payment source | Source: Ambulatory Visit | Attending: Urology | Admitting: Urology

## 2019-08-17 ENCOUNTER — Other Ambulatory Visit: Payer: Self-pay

## 2019-08-17 DIAGNOSIS — C61 Malignant neoplasm of prostate: Secondary | ICD-10-CM | POA: Diagnosis present

## 2019-08-17 LAB — POCT I-STAT CREATININE: Creatinine, Ser: 1.5 mg/dL — ABNORMAL HIGH (ref 0.61–1.24)

## 2019-08-17 MED ORDER — LIDOCAINE HCL URETHRAL/MUCOSAL 2 % EX GEL
CUTANEOUS | Status: AC
  Filled 2019-08-17: qty 30

## 2019-08-17 MED ORDER — GADOBUTROL 1 MMOL/ML IV SOLN
10.0000 mL | Freq: Once | INTRAVENOUS | Status: AC | PRN
  Administered 2019-08-17: 10 mL via INTRAVENOUS

## 2019-08-17 MED ORDER — LIDOCAINE HCL URETHRAL/MUCOSAL 2 % EX GEL: 1.0000 "application " | Freq: Once | CUTANEOUS | Status: DC

## 2020-05-19 HISTORY — PX: PILONIDAL CYST EXCISION: SHX744

## 2020-10-03 ENCOUNTER — Encounter: Payer: Self-pay | Admitting: Radiation Oncology

## 2020-10-03 NOTE — Progress Notes (Signed)
GU Location of Tumor / Histology: prostatic adenocarcinoma  If Prostate Cancer, Gleason Score is (3 + 4) and PSA is (9.09). Prostate volume: 49 mL.   08/26/2020 PSA 14.20  [name] has been under active surveillance since 2010 by Dr. Janice Norrie. Patient reports seeing Dr. Janice Norrie until he retired then Dr. Sharin Grave. Patient explains after Dr. Sharin Grave left the practice he began seeing Dr. Tresa Moore. Unfortunately, 08/29/2020 surveillance biopsy revealed progression.   Biopsies of prostate (if applicable) revealed:    Past/Anticipated interventions by urology, if any: prostate biopsy, active surveillance, referral to Dr. Tammi Klippel for consideration of radiotherapy  Past/Anticipated interventions by medical oncology, if any: no  Weight changes, if any: denies  Bowel/Bladder complaints, if any: IPSS 15. SHIM 5. Denies dysuria or hematuria. Reports urinary leakage mostly in the morning related to urgency with initial void. Reports struggling to find the balance between diarrhea and constipation.  Nausea/Vomiting, if any: denies  Pain issues, if any:  denies  SAFETY ISSUES:  Prior radiation? denies  Pacemaker/ICD? denies  Possible current pregnancy? no, male patient  Is the patient on methotrexate? denies  Current Complaints / other details:  74 year old male. Retired [employer] Engineer, structural x 30 years. Married.

## 2020-10-04 ENCOUNTER — Ambulatory Visit
Admission: RE | Admit: 2020-10-04 | Discharge: 2020-10-04 | Disposition: A | Discharge status: Home / Self Care | Payer: No Typology Code available for payment source | Source: Ambulatory Visit | Attending: Radiation Oncology | Admitting: Radiation Oncology

## 2020-10-04 ENCOUNTER — Encounter: Payer: Self-pay | Admitting: Medical Oncology

## 2020-10-04 ENCOUNTER — Other Ambulatory Visit: Payer: Self-pay

## 2020-10-04 ENCOUNTER — Encounter: Payer: Self-pay | Admitting: Radiation Oncology

## 2020-10-04 VITALS — BP 114/91 | HR 86 | Temp 97.2°F | Resp 18 | Ht 70.0 in | Wt 237.5 lb

## 2020-10-04 DIAGNOSIS — G4730 Sleep apnea, unspecified: Secondary | ICD-10-CM | POA: Insufficient documentation

## 2020-10-04 DIAGNOSIS — I10 Essential (primary) hypertension: Secondary | ICD-10-CM | POA: Diagnosis not present

## 2020-10-04 DIAGNOSIS — Z8043 Family history of malignant neoplasm of testis: Secondary | ICD-10-CM | POA: Diagnosis not present

## 2020-10-04 DIAGNOSIS — Z86718 Personal history of other venous thrombosis and embolism: Secondary | ICD-10-CM | POA: Insufficient documentation

## 2020-10-04 DIAGNOSIS — N189 Chronic kidney disease, unspecified: Secondary | ICD-10-CM | POA: Diagnosis not present

## 2020-10-04 DIAGNOSIS — F419 Anxiety disorder, unspecified: Secondary | ICD-10-CM | POA: Diagnosis not present

## 2020-10-04 DIAGNOSIS — Z800 Family history of malignant neoplasm of digestive organs: Secondary | ICD-10-CM | POA: Insufficient documentation

## 2020-10-04 DIAGNOSIS — Z7982 Long term (current) use of aspirin: Secondary | ICD-10-CM | POA: Insufficient documentation

## 2020-10-04 DIAGNOSIS — E7800 Pure hypercholesterolemia, unspecified: Secondary | ICD-10-CM | POA: Diagnosis not present

## 2020-10-04 DIAGNOSIS — Z8673 Personal history of transient ischemic attack (TIA), and cerebral infarction without residual deficits: Secondary | ICD-10-CM | POA: Diagnosis not present

## 2020-10-04 DIAGNOSIS — E119 Type 2 diabetes mellitus without complications: Secondary | ICD-10-CM | POA: Insufficient documentation

## 2020-10-04 DIAGNOSIS — Z79899 Other long term (current) drug therapy: Secondary | ICD-10-CM | POA: Diagnosis not present

## 2020-10-04 DIAGNOSIS — Z794 Long term (current) use of insulin: Secondary | ICD-10-CM | POA: Diagnosis not present

## 2020-10-04 DIAGNOSIS — C61 Malignant neoplasm of prostate: Secondary | ICD-10-CM | POA: Diagnosis present

## 2020-10-04 DIAGNOSIS — M129 Arthropathy, unspecified: Secondary | ICD-10-CM | POA: Diagnosis not present

## 2020-10-04 NOTE — Progress Notes (Signed)
Introduced myself to patient and his wife, as the prostate nurse navigator and discussed my role. He has been followed since 2013 for elevated PSA and low grade prostate cancer. His last biopsy showed a Gleason 7 and he is here to discuss his radiation treatment options. He is interested in brachytherapy. I gave him my business card and asked him to call me with questions or concerns. He voiced understanding.

## 2020-10-04 NOTE — Progress Notes (Signed)
Radiation Oncology         [phone] ________________________________  Initial outpatient Consultation  Name: [name] MRN: [id number]  Date: 10/04/2020  DOB: [birth year]  JM:EQAS, Nathen May, MD  Alexis Frock, MD   REFERRING PHYSICIAN: Alexis Frock, MD  DIAGNOSIS: 74 y.o. gentleman with Stage T1c adenocarcinoma of the prostate with Gleason score of 3+4, and PSA of 14.2.    ICD-10-CM   1. Malignant neoplasm of prostate ([place])  C61     HISTORY OF PRESENT ILLNESS: [name] is a 74 y.o. male with a diagnosis of prostate cancer. He has been followed by [facility] for an elevated PSA dating back to at least 2013. He was initially diagnosed with low volume Gleason 3+3 prostate cancer in 12/2012 under the care of Dr. Janice Norrie. Since that time, he has been under active surveillance, under the care of Dr. Pilar Jarvis and more recently Dr. Tresa Moore as well as the [facility]. He underwent surveillance prostate biopsies in 05/2015 at the [facility] and in 07/2017 with Dr. Pilar Jarvis at [place], both of which showed stable, low risk disease.  He has also had prostate MRI as part of his surveillance in 2018 and most recently in October 2020, both showing stable low risk disease as well.  Since his initial diagnosis, his PSA has remained stable elevated with some fluctuation and DRE has remained benign.  In October 2021, his PSA was noted to be further elevated at 14.2. He underwent repeat surveillance biopsy on 08/29/2020. The prostate volume measured 49 cc.  Out of 12 core biopsies, 5 were positive.  The maximum Gleason score was 3+4, and this was seen in the right apex lateral (with PNI), left apex, left mid lateral, and left apex lateral. Additionally, a small focus of Gleason 3+3 disease was seen in the left mid.  The patient reviewed the biopsy results with his urologist and he has kindly been referred today for discussion of potential radiation treatment options.  Given his age at 74 years old as well as  multiple medical comorbidities, he is not felt to be an ideal surgical candidate.   PREVIOUS RADIATION THERAPY: No  PAST MEDICAL HISTORY:  Past Medical History:  Diagnosis Date  . Agent Orange poisoning   . Anemia   . Anxiety   . Arthritis   . Basal cell carcinoma    back, stomach and face  . Cataract   . Chronic kidney disease (CKD)   . Clotting disorder ([place])   . Diabetes mellitus    type 2   . Diverticulosis   . DVT (deep venous thrombosis) ([place])   . Gout   . History of nonmelanoma skin cancer   . Hypercholesterolemia   . Hypertension   . Kidney stones   . Prostate cancer ([place])   . Renal insufficiency   . Sleep apnea    has cpap but does not use  . Stroke [facility])       PAST SURGICAL HISTORY: Past Surgical History:  Procedure Laterality Date  . APPENDECTOMY    . Eye Lift     2003  . Jaw Replacement     cadaver bone   . MOHS SURGERY    . TOTAL HIP ARTHROPLASTY Right   . TOTAL KNEE ARTHROPLASTY Right 01/22/2017   Procedure: RIGHT TOTAL KNEE ARTHROPLASTY;  Surgeon: Paralee Cancel, MD;  Location: [facility] ORS;  Service: Orthopedics;  Laterality: Right;    FAMILY HISTORY:  Family History  Problem Relation Age of Onset  .  Dementia Mother   . Hypertension Mother   . Diabetes Mother   . Heart attack Father 13  . Kidney cancer Brother        tumor removed  . Hypertension Brother   . Liver cancer Maternal Grandfather   . Testicular cancer Brother   . Cancer Paternal Aunt   . Colon cancer Neg Hx   . Colon polyps Neg Hx   . Esophageal cancer Neg Hx   . Stomach cancer Neg Hx   . Rectal cancer Neg Hx     SOCIAL HISTORY:  Social History   Socioeconomic History  . Marital status: Married    Spouse name: Not on file  . Number of children: 2  . Years of education: Not on file  . Highest education level: Not on file  Occupational History  . Occupation: Patent examiner  . Occupation: realtor  Tobacco Use  . Smoking status: Never Smoker  . Smokeless tobacco: Never  Used  Vaping Use  . Vaping Use: Never used  Substance and Sexual Activity  . Alcohol use: No  . Drug use: No  . Sexual activity: Yes  Other Topics Concern  . Not on file  Social History Narrative  . Not on file   Social Determinants of Health   Financial Resource Strain:   . Difficulty of Paying Living Expenses: Not on file  Food Insecurity:   . Worried About Programme researcher, broadcasting/film/video in the Last Year: Not on file  . Ran Out of Food in the Last Year: Not on file  Transportation Needs:   . Lack of Transportation (Medical): Not on file  . Lack of Transportation (Non-Medical): Not on file  Physical Activity:   . Days of Exercise per Week: Not on file  . Minutes of Exercise per Session: Not on file  Stress:   . Feeling of Stress : Not on file  Social Connections:   . Frequency of Communication with Friends and Family: Not on file  . Frequency of Social Gatherings with Friends and Family: Not on file  . Attends Religious Services: Not on file  . Active Member of Clubs or Organizations: Not on file  . Attends Banker Meetings: Not on file  . Marital Status: Not on file  Intimate Partner Violence:   . Fear of Current or Ex-Partner: Not on file  . Emotionally Abused: Not on file  . Physically Abused: Not on file  . Sexually Abused: Not on file    ALLERGIES: Patient has no known allergies.  MEDICATIONS:  Current Outpatient Medications  Medication Sig Dispense Refill  . amitriptyline (ELAVIL) 50 MG tablet Take 50 mg by mouth at bedtime.     Marland Kitchen amLODipine (NORVASC) 10 MG tablet Take 10 mg by mouth at bedtime.     Marland Kitchen aspirin EC 81 MG tablet Take 81 mg by mouth in the morning and at bedtime. Swallow whole.    Marland Kitchen atorvastatin (LIPITOR) 20 MG tablet Take 20 mg by mouth at bedtime.     Marland Kitchen buPROPion (WELLBUTRIN XL) 150 MG 24 hr tablet Take 150 mg by mouth daily.    . carvedilol (COREG) 12.5 MG tablet Take 12.5 mg by mouth 2 (two) times daily.     . colchicine 0.6 MG tablet Take  0.6 mg by mouth daily as needed (gout).     . febuxostat (ULORIC) 40 MG tablet Take 40 mg by mouth daily.    . ferrous sulfate (FERROUSUL) 325 (65 FE) MG  tablet Take 1 tablet (325 mg total) by mouth 3 (three) times daily with meals.    . gabapentin (NEURONTIN) 300 MG capsule Take 600 mg by mouth at bedtime.    . hydrocortisone 2.5 % cream Apply 1 application topically at bedtime.    . insulin glargine (LANTUS) 100 UNIT/ML injection Inject 50 Units into the skin daily.     . iron dextran complex in sodium chloride 0.9 % 500 mL Inject into the vein once.    Marland Kitchen lisinopril (PRINIVIL,ZESTRIL) 10 MG tablet Take 10 mg by mouth every evening.    . Menthol, Topical Analgesic, (ICY HOT NATURALS) 7.5 % CREA Apply 1 application topically 2 (two) times daily as needed. 1 Tube 1  . metFORMIN (GLUCOPHAGE) 500 MG tablet Take 500 mg by mouth 2 (two) times daily with a meal.    . Pyridoxine HCl (VITAMIN B-6 PO) Take 2 tablets by mouth daily.     . Tetrahydrozoline HCl (EYE DROPS OP) Place 3 drops into both eyes daily as needed (dry eyes).    . vitamin B-12 (CYANOCOBALAMIN) 1000 MCG tablet Take 1,000 mcg by mouth daily.     Current Facility-Administered Medications  Medication Dose Route Frequency Provider Last Rate Last Admin  . 0.9 %  sodium chloride infusion  500 mL Intravenous Once Ladene Artist, MD        REVIEW OF SYSTEMS:  On review of systems, the patient reports that he is doing well overall. He denies any chest pain, shortness of breath, cough, fevers, chills, night sweats, unintended weight changes. He denies any bowel disturbances, and denies abdominal pain, nausea or vomiting. He denies any new musculoskeletal or joint aches or pains. His IPSS was 15, indicating moderate urinary symptoms. He reports urinary leakage, mostly in the morning related to urgency with initial void. He also reports struggling to find the balance between diarrhea and constipation. His SHIM was 5, indicating he has severe  erectile dysfunction. A complete review of systems is obtained and is otherwise negative.    PHYSICAL EXAM:  Wt Readings from Last 3 Encounters:  10/04/20 237 lb 8 oz (107.7 kg)  03/19/18 244 lb (110.7 kg)  02/24/18 244 lb 2 oz (110.7 kg)   Temp Readings from Last 3 Encounters:  10/04/20 (!) 97.2 F (36.2 C) (Temporal)  03/19/18 98.4 F (36.9 C) (Temporal)  02/13/18 98.1 F (36.7 C) (Oral)   BP Readings from Last 3 Encounters:  10/04/20 (!) 114/91  03/19/18 (!) 156/75  02/24/18 134/70   Pulse Readings from Last 3 Encounters:  10/04/20 86  03/19/18 80  02/24/18 84   Pain Assessment Pain Score: 0-No pain/10  In general this is a well appearing Caucasian male in no acute distress. He is alert and oriented x4 and appropriate throughout the examination. HEENT reveals that the patient is normocephalic, atraumatic. EOMs are intact. PERRLA. Skin is intact without any evidence of gross lesions. Cardiopulmonary assessment is negative for acute distress and he exhibits normal effort. The abdomen is soft, non tender, non distended. Lower extremities are negative for pretibial pitting edema, deep calf tenderness, cyanosis or clubbing.   KPS = 90  100 - Normal; no complaints; no evidence of disease. 90   - Able to carry on normal activity; minor signs or symptoms of disease. 80   - Normal activity with effort; some signs or symptoms of disease. 45   - Cares for self; unable to carry on normal activity or to do active work. 60   -  Requires occasional assistance, but is able to care for most of his personal needs. 50   - Requires considerable assistance and frequent medical care. 92   - Disabled; requires special care and assistance. 16   - Severely disabled; hospital admission is indicated although death not imminent. 32   - Very sick; hospital admission necessary; active supportive treatment necessary. 10   - Moribund; fatal processes progressing rapidly. 0     - Dead  Karnofsky DA,  Abelmann [place], Craver LS and Burchenal [facility] [phone]) The use of the nitrogen mustards in the palliative treatment of carcinoma: with particular reference to bronchogenic carcinoma Cancer 1 [phone]  LABORATORY DATA:  Lab Results  Component Value Date   WBC 10.9 (H) 01/23/2017   HGB 8.8 (L) 01/23/2017   HCT 27.1 (L) 01/23/2017   MCV 82.1 01/23/2017   PLT 201 01/23/2017   Lab Results  Component Value Date   NA 139 01/23/2017   K 4.2 01/23/2017   CL 109 01/23/2017   CO2 22 01/23/2017   Lab Results  Component Value Date   ALT 22 03/13/2014   AST 18 03/13/2014   ALKPHOS 104 03/13/2014   BILITOT 0.2 (L) 03/13/2014     RADIOGRAPHY: No results found.    IMPRESSION/PLAN: 1. 74 y.o. gentleman with Stage T1c adenocarcinoma of the prostate with Gleason Score of 3+4, and PSA of 14.2. We discussed the patient's workup and outlined the nature of prostate cancer in this setting. The patient's T stage, Gleason's score, and PSA put him into the favorable intermediate risk group. Accordingly, he is eligible for a variety of potential treatment options including brachytherapy, 5.5 weeks of external radiation, or prostatectomy. We discussed the available radiation techniques, and focused on the details and logistics of delivery. We discussed and outlined the risks, benefits, short and long-term effects associated with radiotherapy and compared and contrasted these with prostatectomy. We discussed the role of SpaceOAR gel in reducing the rectal toxicity associated with radiotherapy.  He was encouraged to ask questions that were answered to his stated satisfaction.  He appears to have a good understanding of his disease and our treatment recommendations which are of curative intent.  At the conclusion of our conversation, the patient is interested in moving forward with brachytherapy and use of SpaceOAR gel to reduce rectal toxicity from radiotherapy.  We will share our discussion with Dr. Tresa Moore and move forward  with scheduling his [facility] planning appointment in the near future.  The patient met briefly with Romie Jumper in our office who will be working closely with him to coordinate OR scheduling and pre and post procedure appointments.  We will contact the pharmaceutical rep to ensure that Mena is available at the time of procedure.  We enjoyed meeting him today and look forward to continuing to participate in his care.    Nicholos Johns, PA-C    Tyler Pita, MD  [facility] Direct Dial: [phone]  Fax: [phone] [email or web address]  Skype  [email or web address]   This document serves as a record of services personally performed by Tyler Pita, MD and Freeman Caldron, PA-C. It was created on their behalf by Wilburn Mylar, a trained medical scribe. The creation of this record is based on the scribe's personal observations and the provider's statements to them. This document has been checked and approved by the attending provider.

## 2020-10-05 ENCOUNTER — Ambulatory Visit: Payer: No Typology Code available for payment source

## 2020-10-05 ENCOUNTER — Ambulatory Visit: Payer: No Typology Code available for payment source | Admitting: Radiation Oncology

## 2020-10-07 ENCOUNTER — Telehealth: Payer: Self-pay | Admitting: *Deleted

## 2020-10-07 NOTE — Telephone Encounter (Signed)
CALLED PATIENT TO INFORM OF PRE-SEED APPTS. AND IMPLANT, LVM FOR A RETURN CALL

## 2020-10-18 ENCOUNTER — Telehealth: Payer: Self-pay | Admitting: *Deleted

## 2020-10-18 NOTE — Telephone Encounter (Signed)
CALLED PATIENT TO REMIND OF PRE-SEED APPTS. FOR 10-20-20, SPOKE WITH PATIENT'S WIFE- KATHY AND SHE IS AWARE OF THESE APPTS.

## 2020-10-19 ENCOUNTER — Other Ambulatory Visit: Payer: Self-pay | Admitting: Urology

## 2020-10-19 NOTE — Progress Notes (Signed)
  Radiation Oncology         [phone]) [phone] ________________________________  Name: KANAI BERRIOS MRN: [id number]  Date: 10/20/2020  DOB: June 01, 1946  SIMULATION AND TREATMENT PLANNING NOTE PUBIC ARCH STUDY  CV:KFMM, Nathen May, MD  Alexis Frock, MD  DIAGNOSIS: 74 y.o. gentleman with Stage T1c adenocarcinoma of the prostate with Gleason score of 3+4, and PSA of 14.2.  Oncology History  Malignant neoplasm of prostate ([place])  08/29/2020 Cancer Staging   Staging form: Prostate, AJCC 8th Edition - Clinical stage from 08/29/2020: Stage IIB (cT1c, cN0, cM0, PSA: 14.2, Grade Group: 2) - Signed by Freeman Caldron, PA-C on 10/05/2020   10/04/2020 Initial Diagnosis   Malignant neoplasm of prostate ([place])       ICD-10-CM   1. Malignant neoplasm of prostate ([place])  C61     COMPLEX SIMULATION:  The patient presented today for evaluation for possible prostate seed implant. He was brought to the radiation planning suite and placed supine on the CT couch. A 3-dimensional image study set was obtained in upload to the planning computer. There, on each axial slice, I contoured the prostate gland. Then, using three-dimensional radiation planning tools I reconstructed the prostate in view of the structures from the transperineal needle pathway to assess for possible pubic arch interference. In doing so, I did not appreciate any pubic arch interference. Also, the patient's prostate volume was estimated based on the drawn structure. The volume was 51 cc.  Given the pubic arch appearance and prostate volume, patient remains a good candidate to proceed with prostate seed implant. Today, he freely provided informed written consent to proceed.    PLAN: The patient will undergo prostate seed implant.   ________________________________  Sheral Apley. Tammi Klippel, M.D.

## 2020-10-20 ENCOUNTER — Ambulatory Visit
Admission: RE | Admit: 2020-10-20 | Discharge: 2020-10-20 | Disposition: A | Discharge status: Home / Self Care | Payer: No Typology Code available for payment source | Source: Ambulatory Visit | Attending: Urology | Admitting: Urology

## 2020-10-20 ENCOUNTER — Encounter (HOSPITAL_COMMUNITY)
Admission: RE | Admit: 2020-10-20 | Discharge: 2020-10-20 | Disposition: A | Discharge status: Home / Self Care | Payer: No Typology Code available for payment source | Source: Ambulatory Visit | Attending: Urology | Admitting: Urology

## 2020-10-20 ENCOUNTER — Other Ambulatory Visit: Payer: Self-pay

## 2020-10-20 ENCOUNTER — Other Ambulatory Visit: Payer: Self-pay | Admitting: Urology

## 2020-10-20 ENCOUNTER — Ambulatory Visit (HOSPITAL_COMMUNITY)
Admission: RE | Admit: 2020-10-20 | Discharge: 2020-10-20 | Disposition: A | Discharge status: Home / Self Care | Payer: No Typology Code available for payment source | Source: Ambulatory Visit | Attending: Urology | Admitting: Urology

## 2020-10-20 ENCOUNTER — Ambulatory Visit
Admission: RE | Admit: 2020-10-20 | Discharge: 2020-10-20 | Disposition: A | Discharge status: Home / Self Care | Payer: No Typology Code available for payment source | Source: Ambulatory Visit | Attending: Radiation Oncology | Admitting: Radiation Oncology

## 2020-10-20 DIAGNOSIS — C61 Malignant neoplasm of prostate: Secondary | ICD-10-CM | POA: Diagnosis present

## 2020-10-20 DIAGNOSIS — Z01811 Encounter for preprocedural respiratory examination: Secondary | ICD-10-CM | POA: Diagnosis present

## 2020-10-20 NOTE — Progress Notes (Signed)
Abnormal CXR 2V done and resulted today.  Route to Dr Darene Lamer. Tresa Moore in epic.

## 2020-11-10 ENCOUNTER — Telehealth: Payer: Self-pay | Admitting: *Deleted

## 2020-11-10 NOTE — Telephone Encounter (Signed)
CALLED PATIENT TO REMIND OF LAB AND COVID TESTING FOR 11-15-20, LVM FOR A RETURN CALL

## 2020-11-14 ENCOUNTER — Encounter (HOSPITAL_BASED_OUTPATIENT_CLINIC_OR_DEPARTMENT_OTHER): Payer: Self-pay | Admitting: Urology

## 2020-11-15 ENCOUNTER — Encounter (HOSPITAL_BASED_OUTPATIENT_CLINIC_OR_DEPARTMENT_OTHER): Payer: Self-pay | Admitting: Urology

## 2020-11-15 ENCOUNTER — Encounter (HOSPITAL_COMMUNITY)
Admission: RE | Admit: 2020-11-15 | Discharge: 2020-11-15 | Disposition: A | Discharge status: Home / Self Care | Payer: No Typology Code available for payment source | Source: Ambulatory Visit | Attending: Urology | Admitting: Urology

## 2020-11-15 ENCOUNTER — Other Ambulatory Visit: Payer: Self-pay

## 2020-11-15 ENCOUNTER — Telehealth: Payer: Self-pay | Admitting: *Deleted

## 2020-11-15 ENCOUNTER — Encounter (INDEPENDENT_AMBULATORY_CARE_PROVIDER_SITE_OTHER): Payer: Self-pay

## 2020-11-15 ENCOUNTER — Other Ambulatory Visit (HOSPITAL_COMMUNITY)
Admission: RE | Admit: 2020-11-15 | Discharge: 2020-11-15 | Disposition: A | Discharge status: Home / Self Care | Payer: No Typology Code available for payment source | Source: Ambulatory Visit | Attending: Urology | Admitting: Urology

## 2020-11-15 DIAGNOSIS — Z01812 Encounter for preprocedural laboratory examination: Secondary | ICD-10-CM | POA: Insufficient documentation

## 2020-11-15 DIAGNOSIS — Z20822 Contact with and (suspected) exposure to covid-19: Secondary | ICD-10-CM | POA: Diagnosis not present

## 2020-11-15 LAB — CBC
HCT: 33.2 % — ABNORMAL LOW (ref 39.0–52.0)
Hemoglobin: 10.8 g/dL — ABNORMAL LOW (ref 13.0–17.0)
MCH: 28.6 pg (ref 26.0–34.0)
MCHC: 32.5 g/dL (ref 30.0–36.0)
MCV: 88.1 fL (ref 80.0–100.0)
Platelets: 224 10*3/uL (ref 150–400)
RBC: 3.77 MIL/uL — ABNORMAL LOW (ref 4.22–5.81)
RDW: 13 % (ref 11.5–15.5)
WBC: 7.4 10*3/uL (ref 4.0–10.5)
nRBC: 0 % (ref 0.0–0.2)

## 2020-11-15 LAB — COMPREHENSIVE METABOLIC PANEL
ALT: 28 U/L (ref 0–44)
AST: 21 U/L (ref 15–41)
Albumin: 3.7 g/dL (ref 3.5–5.0)
Alkaline Phosphatase: 113 U/L (ref 38–126)
Anion gap: 10 (ref 5–15)
BUN: 21 mg/dL (ref 8–23)
CO2: 21 mmol/L — ABNORMAL LOW (ref 22–32)
Calcium: 8.9 mg/dL (ref 8.9–10.3)
Chloride: 109 mmol/L (ref 98–111)
Creatinine, Ser: 1.6 mg/dL — ABNORMAL HIGH (ref 0.61–1.24)
GFR, Estimated: 45 mL/min — ABNORMAL LOW (ref >60)
Glucose, Bld: 151 mg/dL — ABNORMAL HIGH (ref 70–99)
Potassium: 4.3 mmol/L (ref 3.5–5.1)
Sodium: 140 mmol/L (ref 135–145)
Total Bilirubin: 0.5 mg/dL (ref 0.3–1.2)
Total Protein: 7.4 g/dL (ref 6.5–8.1)

## 2020-11-15 LAB — PROTIME-INR
INR: 1 (ref 0.8–1.2)
Prothrombin Time: 12.7 seconds (ref 11.4–15.2)

## 2020-11-15 LAB — APTT: aPTT: 28 seconds (ref 24–36)

## 2020-11-15 NOTE — Progress Notes (Signed)
Spoke w/ via phone for pre-op interview--- Pt wife, pt very HOH, and pt Lab needs dos---- none              Lab results------ pt had CBC, CMP, PT/PTT done 11-15-2020 results in epic;  Current ekg/ cxr in epic/ chart COVID test ------ done 11-15-2020 result in epic Arrive at ------- 0630 NPO after MN NO Solid Food.  Clear liquids from MN until--- 0530 Medications to take morning of surgery ----- Coreg, Wellbutrin Diabetic medication ----- do not take metformin morning of surgery and only do half lantus insulin dose night before surgery Patient Special Instructions ----- n/a Pre-Op special Istructions ----- may need pt wife in pre-op due to [facility] Patient wife verbalized understanding of instructions that were given at this phone interview. Patient wife stated pt denies , chest pain, fever, cough at this phone interview.   Anesthesia : HTN;  OSA w/ cpap (nightly per pt);  Hx CVA without residual 2015;  DM2;  CKD 3;  Pt wife stated pt denies any cardiac s&s / no peripheral swelling but does get sob with exertion.    PCP:  [facility] [place] [facility] Cardiologist :  There is office note by dr Patty Sermons in epic 08-03-2014, lov Chest x-ray :  10-20-2020 epic EKG : 10-20-2020 epic Echo :  03-03-2014 epic Stress test:  no Cardiac Cath :   no Activity level:   Sob w/ exertion Sleep Study/ CPAP :  YES/ YES Fasting Blood Sugar :   110--120   / Checks Blood Sugar -- times a day:   Twice daily Blood Thinner/ Instructions /Last Dose:  NO ASA / Instructions/ Last Dose : ASA 81mg /  Per pt wife stated was given instructions by dr office to stop week prior to surgery , last dose approx. 10-24-2020

## 2020-11-15 NOTE — Telephone Encounter (Signed)
CALLED PATIENT TO REMIND OF PROCEDURE FOR 11-16-20, SPOKE WITH PATIENT'S WIFE- [name] AND SHE IS AWARE OF THIS PROCEDURE

## 2020-11-16 ENCOUNTER — Ambulatory Visit (HOSPITAL_BASED_OUTPATIENT_CLINIC_OR_DEPARTMENT_OTHER)
Admission: RE | Admit: 2020-11-16 | Discharge: 2020-11-16 | Disposition: A | Discharge status: Home / Self Care | Payer: No Typology Code available for payment source | Source: Ambulatory Visit | Attending: Urology | Admitting: Urology

## 2020-11-16 ENCOUNTER — Ambulatory Visit (HOSPITAL_BASED_OUTPATIENT_CLINIC_OR_DEPARTMENT_OTHER): Payer: No Typology Code available for payment source | Admitting: Certified Registered Nurse Anesthetist

## 2020-11-16 ENCOUNTER — Encounter (HOSPITAL_BASED_OUTPATIENT_CLINIC_OR_DEPARTMENT_OTHER)
Admission: RE | Disposition: A | Discharge status: Home / Self Care | Payer: Self-pay | Source: Ambulatory Visit | Attending: Urology

## 2020-11-16 ENCOUNTER — Other Ambulatory Visit: Payer: Self-pay

## 2020-11-16 ENCOUNTER — Ambulatory Visit (HOSPITAL_COMMUNITY): Payer: No Typology Code available for payment source

## 2020-11-16 ENCOUNTER — Encounter (HOSPITAL_BASED_OUTPATIENT_CLINIC_OR_DEPARTMENT_OTHER): Payer: Self-pay | Admitting: Urology

## 2020-11-16 DIAGNOSIS — Z7982 Long term (current) use of aspirin: Secondary | ICD-10-CM | POA: Diagnosis not present

## 2020-11-16 DIAGNOSIS — Z96651 Presence of right artificial knee joint: Secondary | ICD-10-CM | POA: Insufficient documentation

## 2020-11-16 DIAGNOSIS — Z809 Family history of malignant neoplasm, unspecified: Secondary | ICD-10-CM | POA: Insufficient documentation

## 2020-11-16 DIAGNOSIS — Z86718 Personal history of other venous thrombosis and embolism: Secondary | ICD-10-CM | POA: Insufficient documentation

## 2020-11-16 DIAGNOSIS — Z87442 Personal history of urinary calculi: Secondary | ICD-10-CM | POA: Insufficient documentation

## 2020-11-16 DIAGNOSIS — Z794 Long term (current) use of insulin: Secondary | ICD-10-CM | POA: Diagnosis not present

## 2020-11-16 DIAGNOSIS — Z800 Family history of malignant neoplasm of digestive organs: Secondary | ICD-10-CM | POA: Insufficient documentation

## 2020-11-16 DIAGNOSIS — Z79899 Other long term (current) drug therapy: Secondary | ICD-10-CM | POA: Diagnosis not present

## 2020-11-16 DIAGNOSIS — C61 Malignant neoplasm of prostate: Secondary | ICD-10-CM | POA: Insufficient documentation

## 2020-11-16 DIAGNOSIS — Z8673 Personal history of transient ischemic attack (TIA), and cerebral infarction without residual deficits: Secondary | ICD-10-CM | POA: Diagnosis not present

## 2020-11-16 DIAGNOSIS — Z85828 Personal history of other malignant neoplasm of skin: Secondary | ICD-10-CM | POA: Insufficient documentation

## 2020-11-16 DIAGNOSIS — Z8043 Family history of malignant neoplasm of testis: Secondary | ICD-10-CM | POA: Diagnosis not present

## 2020-11-16 DIAGNOSIS — Z8051 Family history of malignant neoplasm of kidney: Secondary | ICD-10-CM | POA: Diagnosis not present

## 2020-11-16 DIAGNOSIS — Z96641 Presence of right artificial hip joint: Secondary | ICD-10-CM | POA: Diagnosis not present

## 2020-11-16 HISTORY — DX: Dyspnea, unspecified: R06.00

## 2020-11-16 HISTORY — DX: Anemia in chronic kidney disease: D63.1

## 2020-11-16 HISTORY — DX: Personal history of other malignant neoplasm of skin: Z98.890

## 2020-11-16 HISTORY — DX: Benign prostatic hyperplasia with lower urinary tract symptoms: N40.1

## 2020-11-16 HISTORY — DX: Other forms of dyspnea: R06.09

## 2020-11-16 HISTORY — DX: Chronic kidney disease, stage 3 unspecified: N18.30

## 2020-11-16 HISTORY — DX: Long term (current) use of insulin: Z79.4

## 2020-11-16 HISTORY — PX: SPACE OAR INSTILLATION: SHX6769

## 2020-11-16 HISTORY — PX: RADIOACTIVE SEED IMPLANT: SHX5150

## 2020-11-16 HISTORY — DX: Polyneuropathy, unspecified: G62.9

## 2020-11-16 HISTORY — DX: Personal history of other venous thrombosis and embolism: Z86.718

## 2020-11-16 HISTORY — DX: Other pruritus: L29.8

## 2020-11-16 HISTORY — DX: Personal history of transient ischemic attack (TIA), and cerebral infarction without residual deficits: Z86.73

## 2020-11-16 HISTORY — DX: Chronic gout, unspecified, without tophus (tophi): M1A.9XX0

## 2020-11-16 HISTORY — DX: Presence of external hearing-aid: Z97.4

## 2020-11-16 HISTORY — DX: Diverticulosis of large intestine without perforation or abscess without bleeding: K57.30

## 2020-11-16 HISTORY — PX: CYSTOSCOPY: SHX5120

## 2020-11-16 HISTORY — DX: Long term (current) use of insulin: E11.9

## 2020-11-16 HISTORY — DX: Chronic kidney disease, unspecified: N18.9

## 2020-11-16 HISTORY — DX: Obstructive sleep apnea (adult) (pediatric): G47.33

## 2020-11-16 HISTORY — DX: Personal history of urinary calculi: Z87.442

## 2020-11-16 HISTORY — DX: Other constipation: K59.09

## 2020-11-16 HISTORY — DX: Personal history of other malignant neoplasm of skin: Z85.828

## 2020-11-16 HISTORY — DX: Other pruritus: L29.89

## 2020-11-16 HISTORY — DX: Presence of spectacles and contact lenses: Z97.3

## 2020-11-16 HISTORY — DX: Contact with and (suspected) exposure to other hazardous, chiefly nonmedicinal, chemicals: Z77.098

## 2020-11-16 HISTORY — DX: Abrasion of other part of head, initial encounter: S00.81XA

## 2020-11-16 LAB — GLUCOSE, CAPILLARY
Glucose-Capillary: 131 mg/dL — ABNORMAL HIGH (ref 70–99)
Glucose-Capillary: 113 mg/dL — ABNORMAL HIGH (ref 70–99)

## 2020-11-16 LAB — SARS CORONAVIRUS 2 (TAT 6-24 HRS): SARS Coronavirus 2: NEGATIVE

## 2020-11-16 SURGERY — INSERTION, RADIATION SOURCE, PROSTATE
Anesthesia: General | Site: Rectum

## 2020-11-16 MED ORDER — FENTANYL CITRATE (PF) 100 MCG/2ML IJ SOLN
INTRAMUSCULAR | Status: AC
  Filled 2020-11-16: qty 2

## 2020-11-16 MED ORDER — OXYCODONE HCL 5 MG PO TABS
5.0000 mg | ORAL_TABLET | Freq: Once | ORAL | Status: DC | PRN
Start: 2020-11-16 — End: 2020-11-16

## 2020-11-16 MED ORDER — LIDOCAINE HCL (PF) 2 % IJ SOLN
INTRAMUSCULAR | Status: AC
  Filled 2020-11-16: qty 5

## 2020-11-16 MED ORDER — LACTATED RINGERS IV SOLN: INTRAVENOUS | Status: DC

## 2020-11-16 MED ORDER — LIDOCAINE 2% (20 MG/ML) 5 ML SYRINGE
INTRAMUSCULAR | Status: DC | PRN
  Administered 2020-11-16: 100 mg via INTRAVENOUS

## 2020-11-16 MED ORDER — ONDANSETRON HCL 4 MG/2ML IJ SOLN
INTRAMUSCULAR | Status: DC | PRN
  Administered 2020-11-16: 4 mg via INTRAVENOUS

## 2020-11-16 MED ORDER — SODIUM CHLORIDE (PF) 0.9 % IJ SOLN
INTRAMUSCULAR | Status: DC | PRN
  Administered 2020-11-16: 10 mL

## 2020-11-16 MED ORDER — FENTANYL CITRATE (PF) 100 MCG/2ML IJ SOLN: 25.0000 ug | INTRAMUSCULAR | Status: DC | PRN

## 2020-11-16 MED ORDER — ACETAMINOPHEN 10 MG/ML IV SOLN: 1000.0000 mg | Freq: Once | INTRAVENOUS | Status: DC | PRN

## 2020-11-16 MED ORDER — GLYCOPYRROLATE PF 0.2 MG/ML IJ SOSY
PREFILLED_SYRINGE | INTRAMUSCULAR | Status: DC | PRN
  Administered 2020-11-16: .2 mg via INTRAVENOUS

## 2020-11-16 MED ORDER — FENTANYL CITRATE (PF) 100 MCG/2ML IJ SOLN
INTRAMUSCULAR | Status: DC | PRN
  Administered 2020-11-16 (×3): 50 ug via INTRAVENOUS

## 2020-11-16 MED ORDER — DEXAMETHASONE SODIUM PHOSPHATE 10 MG/ML IJ SOLN
INTRAMUSCULAR | Status: AC
  Filled 2020-11-16: qty 1

## 2020-11-16 MED ORDER — SODIUM CHLORIDE 0.9 % IR SOLN
Status: DC | PRN
  Administered 2020-11-16: 150 mL

## 2020-11-16 MED ORDER — ONDANSETRON HCL 4 MG/2ML IJ SOLN
INTRAMUSCULAR | Status: AC
  Filled 2020-11-16: qty 2

## 2020-11-16 MED ORDER — PHENYLEPHRINE 40 MCG/ML (10ML) SYRINGE FOR IV PUSH (FOR BLOOD PRESSURE SUPPORT)
PREFILLED_SYRINGE | INTRAVENOUS | Status: AC
  Filled 2020-11-16: qty 10

## 2020-11-16 MED ORDER — IOHEXOL 300 MG/ML  SOLN
INTRAMUSCULAR | Status: DC | PRN
  Administered 2020-11-16: 09:00:00 7 mL

## 2020-11-16 MED ORDER — FLEET ENEMA 7-19 GM/118ML RE ENEM: 1.0000 | ENEMA | Freq: Once | RECTAL | Status: DC

## 2020-11-16 MED ORDER — PHENYLEPHRINE 40 MCG/ML (10ML) SYRINGE FOR IV PUSH (FOR BLOOD PRESSURE SUPPORT)
PREFILLED_SYRINGE | INTRAVENOUS | Status: DC | PRN
  Administered 2020-11-16: 80 ug via INTRAVENOUS
  Administered 2020-11-16: 120 ug via INTRAVENOUS
  Administered 2020-11-16: 40 ug via INTRAVENOUS
  Administered 2020-11-16: 120 ug via INTRAVENOUS

## 2020-11-16 MED ORDER — CEFAZOLIN SODIUM-DEXTROSE 1-4 GM/50ML-% IV SOLN
INTRAVENOUS | Status: AC
  Filled 2020-11-16: qty 50

## 2020-11-16 MED ORDER — GLYCOPYRROLATE PF 0.2 MG/ML IJ SOSY
PREFILLED_SYRINGE | INTRAMUSCULAR | Status: AC
  Filled 2020-11-16: qty 1

## 2020-11-16 MED ORDER — SODIUM CHLORIDE 0.9 % IV SOLN
1.0000 g | Freq: Once | INTRAVENOUS | Status: AC
  Administered 2020-11-16: 08:00:00 1 g via INTRAVENOUS

## 2020-11-16 MED ORDER — EPHEDRINE SULFATE-NACL 50-0.9 MG/10ML-% IV SOSY
PREFILLED_SYRINGE | INTRAVENOUS | Status: DC | PRN
  Administered 2020-11-16 (×4): 10 mg via INTRAVENOUS

## 2020-11-16 MED ORDER — DEXAMETHASONE SODIUM PHOSPHATE 10 MG/ML IJ SOLN
INTRAMUSCULAR | Status: DC | PRN
  Administered 2020-11-16: 5 mg via INTRAVENOUS

## 2020-11-16 MED ORDER — EPHEDRINE 5 MG/ML INJ
INTRAVENOUS | Status: AC
  Filled 2020-11-16: qty 10

## 2020-11-16 MED ORDER — PROPOFOL 10 MG/ML IV BOLUS
INTRAVENOUS | Status: DC | PRN
  Administered 2020-11-16: 150 mg via INTRAVENOUS
  Administered 2020-11-16 (×2): 20 mg via INTRAVENOUS

## 2020-11-16 MED ORDER — SODIUM CHLORIDE 0.9 % IV SOLN: INTRAVENOUS | Status: DC

## 2020-11-16 MED ORDER — PROPOFOL 10 MG/ML IV BOLUS
INTRAVENOUS | Status: AC
  Filled 2020-11-16: qty 20

## 2020-11-16 MED ORDER — TRAMADOL HCL 50 MG PO TABS: 50.0000 mg | ORAL_TABLET | Freq: Four times a day (QID) | ORAL | 0 refills | Status: DC | PRN

## 2020-11-16 MED ORDER — TAMSULOSIN HCL 0.4 MG PO CAPS: 0.4000 mg | ORAL_CAPSULE | Freq: Every day | ORAL | 1 refills | Status: DC | PRN

## 2020-11-16 MED ORDER — OXYCODONE HCL 5 MG/5ML PO SOLN
5.0000 mg | Freq: Once | ORAL | Status: DC | PRN
Start: 2020-11-16 — End: 2020-11-16

## 2020-11-16 MED ORDER — STERILE WATER FOR IRRIGATION IR SOLN
Status: DC | PRN
  Administered 2020-11-16: 3 mL

## 2020-11-16 SURGICAL SUPPLY — 41 items
BAG DECANTER FOR FLEXI CONT (MISCELLANEOUS) ×5 IMPLANT
BAG DRN RND TRDRP ANRFLXCHMBR (UROLOGICAL SUPPLIES) ×3
BAG URINE DRAIN 2000ML AR STRL (UROLOGICAL SUPPLIES) ×5 IMPLANT
BLADE CLIPPER SENSICLIP SURGIC (BLADE) ×5 IMPLANT
CATH FOLEY 2WAY SLVR  5CC 16FR (CATHETERS) ×5
CATH FOLEY 2WAY SLVR 5CC 16FR (CATHETERS) ×3 IMPLANT
CATH ROBINSON RED A/P 16FR (CATHETERS) IMPLANT
CATH ROBINSON RED A/P 20FR (CATHETERS) ×5 IMPLANT
CLOTH BEACON ORANGE TIMEOUT ST (SAFETY) ×5 IMPLANT
CNTNR URN SCR LID CUP LEK RST (MISCELLANEOUS) ×6 IMPLANT
CONT SPEC 4OZ STRL OR WHT (MISCELLANEOUS) ×10
COVER BACK TABLE 60X90IN (DRAPES) ×5 IMPLANT
COVER MAYO STAND STRL (DRAPES) ×5 IMPLANT
DRAPE C-ARM 35X43 STRL (DRAPES) ×5 IMPLANT
DRSG TEGADERM 4X4.75 (GAUZE/BANDAGES/DRESSINGS) ×5 IMPLANT
DRSG TEGADERM 8X12 (GAUZE/BANDAGES/DRESSINGS) ×5 IMPLANT
GAUZE SPONGE 4X4 12PLY STRL LF (GAUZE/BANDAGES/DRESSINGS) ×5 IMPLANT
GLOVE BIO SURGEON STRL SZ 6 (GLOVE) IMPLANT
GLOVE BIO SURGEON STRL SZ 6.5 (GLOVE) IMPLANT
GLOVE BIO SURGEON STRL SZ7.5 (GLOVE) ×5 IMPLANT
GLOVE BIO SURGEON STRL SZ8 (GLOVE) IMPLANT
GLOVE BIO SURGEONS STRL SZ 6.5 (GLOVE)
GLOVE SURG ORTHO 8.5 STRL (GLOVE) ×5 IMPLANT
GLOVE SURG SS PI 6.5 STRL IVOR (GLOVE) IMPLANT
GOWN STRL REUS W/TWL LRG LVL3 (GOWN DISPOSABLE) ×5 IMPLANT
GOWN STRL REUS W/TWL XL LVL3 (GOWN DISPOSABLE) ×5 IMPLANT
HOLDER FOLEY CATH W/STRAP (MISCELLANEOUS) IMPLANT
I-Seed AgX100 ×285 IMPLANT
IMPL SPACEOAR VUE SYSTEM (Spacer) ×3 IMPLANT
IMPLANT SPACEOAR VUE SYSTEM (Spacer) ×5 IMPLANT
IV NS 1000ML (IV SOLUTION) ×5
IV NS 1000ML BAXH (IV SOLUTION) ×3 IMPLANT
KIT TURNOVER CYSTO (KITS) ×5 IMPLANT
MARKER SKIN DUAL TIP RULER LAB (MISCELLANEOUS) ×5 IMPLANT
PACK CYSTO (CUSTOM PROCEDURE TRAY) ×5 IMPLANT
SURGILUBE 2OZ TUBE FLIPTOP (MISCELLANEOUS) ×5 IMPLANT
SUT BONE WAX W31G (SUTURE) IMPLANT
SYR 10ML LL (SYRINGE) ×10 IMPLANT
TOWEL OR 17X26 10 PK STRL BLUE (TOWEL DISPOSABLE) ×5 IMPLANT
UNDERPAD 30X36 HEAVY ABSORB (UNDERPADS AND DIAPERS) ×10 IMPLANT
WATER STERILE IRR 500ML POUR (IV SOLUTION) ×5 IMPLANT

## 2020-11-16 NOTE — Anesthesia Procedure Notes (Signed)
Procedure Name: LMA Insertion Date/Time: 11/16/2020 8:33 AM Performed by: Bishop Limbo, CRNA Pre-anesthesia Checklist: Patient identified, Emergency Drugs available, Suction available and Patient being monitored Patient Re-evaluated:Patient Re-evaluated prior to induction Oxygen Delivery Method: Circle System Utilized Preoxygenation: Pre-oxygenation with 100% oxygen Induction Type: IV induction Ventilation: Mask ventilation without difficulty LMA: LMA inserted LMA Size: 5.0 Number of attempts: 1 Placement Confirmation: positive ETCO2 Tube secured with: Tape Dental Injury: Teeth and Oropharynx as per pre-operative assessment

## 2020-11-16 NOTE — Anesthesia Postprocedure Evaluation (Signed)
Anesthesia Post Note  Patient: [name]  Procedure(s) Performed: RADIOACTIVE SEED IMPLANT/BRACHYTHERAPY IMPLANT (N/A Prostate) SPACE OAR INSTILLATION (N/A Rectum) CYSTOSCOPY FLEXIBLE (N/A Bladder)     Patient location during evaluation: PACU Anesthesia Type: General Level of consciousness: awake and alert Pain management: pain level controlled Vital Signs Assessment: post-procedure vital signs reviewed and stable Respiratory status: spontaneous breathing, nonlabored ventilation, respiratory function stable and patient connected to nasal cannula oxygen Cardiovascular status: blood pressure returned to baseline and stable Postop Assessment: no apparent nausea or vomiting Anesthetic complications: no   No complications documented.  Last Vitals:  Vitals:   11/16/20 1015 11/16/20 1030  BP: 131/67 114/63  Pulse: 80 80  Resp: 13 16  Temp:    SpO2: 100% 95%    Last Pain:  Vitals:   11/16/20 1030  TempSrc:   PainSc: 0-No pain                 Tiny Chaudhary S

## 2020-11-16 NOTE — Discharge Instructions (Signed)
1 - You may have urinary urgency (bladder spasms) and bloody urine on / off with for up to 3 weeks. This is normal.  2 - Call MD or go to ER for fever >102, severe pain / nausea / vomiting not relieved by medications, or acute change in medical status  Radioactive Seed Implant Home Care Instructions   Activity:    Rest for the remainder of the day.  Do not drive or operate equipment today.  You may resume normal  activities in a few days as instructed by your physician, without risk of harmful radiation exposure to those around you, provided you follow the time and distance precautions on the Radiation Oncology Instruction Sheet.   Meals: Drink plenty of lipuids and eat light foods, such as gelatin or soup this evening .  You may return to normal meal plan tomorrow.  Return To Work: You may return to work as instructed by Designer, multimedia.  Special Instruction:   If any seeds are found, use tweezers to pick up seeds and place in a glass container of any kind and bring to your physician's office.  Call your physician if any of these symptoms occur:   Persistent or heavy bleeding  Urine stream diminishes or stops completely after catheter is removed  Fever equal to or greater than 101 degrees F  Cloudy urine with a strong foul odor  Severe pain  You may feel some burning pain and/or hesitancy when you urinate after the catheter is removed.  These symptoms may increase over the next few weeks, but should diminish within forur to six weeks.  Applying moist heat to the lower abdomen or a hot tub bath may help relieve the pain.  If the discomfort becomes severe, please call your physician for additional medications.   Post Anesthesia Home Care Instructions  Activity: Get plenty of rest for the remainder of the day. A responsible adult should stay with you for 24 hours following the procedure.  For the next 24 hours, DO NOT: -Drive a car -Advertising copywriter -Drink alcoholic  beverages -Take any medication unless instructed by your physician -Make any legal decisions or sign important papers.  Meals: Start with liquid foods such as gelatin or soup. Progress to regular foods as tolerated. Avoid greasy, spicy, heavy foods. If nausea and/or vomiting occur, drink only clear liquids until the nausea and/or vomiting subsides. Call your physician if vomiting continues.  Special Instructions/Symptoms: Your throat may feel dry or sore from the anesthesia or the breathing tube placed in your throat during surgery. If this causes discomfort, gargle with warm salt water. The discomfort should disappear within 24 hours.  If you had a scopolamine patch placed behind your ear for the management of post- operative nausea and/or vomiting:  1. The medication in the patch is effective for 72 hours, after which it should be removed.  Wrap patch in a tissue and discard in the trash. Wash hands thoroughly with soap and water. 2. You may remove the patch earlier than 72 hours if you experience unpleasant side effects which may include dry mouth, dizziness or visual disturbances. 3. Avoid touching the patch. Wash your hands with soap and water after contact with the patch.

## 2020-11-16 NOTE — Anesthesia Preprocedure Evaluation (Signed)
Anesthesia Evaluation  Patient identified by MRN, date of birth, ID band Patient awake    Reviewed: Allergy & Precautions, H&P , NPO status , Patient's Chart, lab work & pertinent test results  Airway Mallampati: II  TM Distance: >3 FB Neck ROM: Full    Dental no notable dental hx.    Pulmonary sleep apnea ,    Pulmonary exam normal breath sounds clear to auscultation       Cardiovascular hypertension, Normal cardiovascular exam Rhythm:Regular Rate:Normal     Neuro/Psych CVA negative psych ROS   GI/Hepatic negative GI ROS, Neg liver ROS,   Endo/Other  diabetes  Renal/GU Renal InsufficiencyRenal disease  negative genitourinary   Musculoskeletal negative musculoskeletal ROS (+)   Abdominal   Peds negative pediatric ROS (+)  Hematology  (+) anemia ,   Anesthesia Other Findings   Reproductive/Obstetrics negative OB ROS                             Anesthesia Physical Anesthesia Plan  ASA: III  Anesthesia Plan: General   Post-op Pain Management:    Induction: Intravenous  PONV Risk Score and Plan: 2 and Ondansetron, Dexamethasone and Treatment may vary due to age or medical condition  Airway Management Planned: LMA  Additional Equipment:   Intra-op Plan:   Post-operative Plan: Extubation in OR  Informed Consent: I have reviewed the patients History and Physical, chart, labs and discussed the procedure including the risks, benefits and alternatives for the proposed anesthesia with the patient or authorized representative who has indicated his/her understanding and acceptance.     Dental advisory given  Plan Discussed with: CRNA and Surgeon  Anesthesia Plan Comments:         Anesthesia Quick Evaluation

## 2020-11-16 NOTE — Brief Op Note (Signed)
11/16/2020  9:48 AM  PATIENT:  [name]  74 y.o. male  PRE-OPERATIVE DIAGNOSIS:  PROSTATE CANCER  POST-OPERATIVE DIAGNOSIS:  PROSTATE CANCER  PROCEDURE:  Procedure(s) with comments: RADIOACTIVE SEED IMPLANT/BRACHYTHERAPY IMPLANT (N/A) - 90 MINS SPACE OAR INSTILLATION (N/A) CYSTOSCOPY   SURGEON:  Surgeon(s) and Role:    * Sebastian Ache, MD - Primary    * Margaretmary Dys, MD - Assisting  PHYSICIAN ASSISTANT:   ASSISTANTS: none   ANESTHESIA:   general  EBL:  5 mL   BLOOD ADMINISTERED:none  DRAINS: none   LOCAL MEDICATIONS USED:  NONE  SPECIMEN:  No Specimen  DISPOSITION OF SPECIMEN:  N/A  COUNTS:  YES  TOURNIQUET:  * No tourniquets in log *  DICTATION: .Other Dictation: Dictation Number [id number]  PLAN OF CARE: Discharge to home after PACU  PATIENT DISPOSITION:  PACU - hemodynamically stable.   Delay start of Pharmacological VTE agent (>24hrs) due to surgical blood loss or risk of bleeding: yes

## 2020-11-16 NOTE — Progress Notes (Signed)
  Radiation Oncology         [phone] ________________________________  Name: MARKELLE NAJARIAN MRN: [id number]  Date: 11/16/2020  DOB: [birth year]       Prostate Seed Implant  QI:[id number], [facility]  No ref. provider found  DIAGNOSIS:  Oncology History  Malignant neoplasm of prostate (HCC)  08/29/2020 Cancer Staging   Staging form: Prostate, AJCC 8th Edition - Clinical stage from 08/29/2020: Stage IIB (cT1c, cN0, cM0, PSA: 14.2, Grade Group: 2) - Signed by Marcello Fennel, PA-C on 10/05/2020   10/04/2020 Initial Diagnosis   Malignant neoplasm of prostate (HCC)     No diagnosis found.  PROCEDURE: Insertion of radioactive I-125 seeds into the prostate gland.  RADIATION DOSE: 145 Gy, definitive therapy.  TECHNIQUE: JEANCLAUDE WENTWORTH was brought to the operating room with the urologist. He was placed in the dorsolithotomy position. He was catheterized and a rectal tube was inserted. The perineum was shaved, prepped and draped. The ultrasound probe was then introduced into the rectum to see the prostate gland.  TREATMENT DEVICE: A needle grid was attached to the ultrasound probe stand and anchor needles were placed.  3D PLANNING: The prostate was imaged in 3D using a sagittal sweep of the prostate probe. These images were transferred to the planning computer. There, the prostate, urethra and rectum were defined on each axial reconstructed image. Then, the software created an optimized 3D plan and a few seed positions were adjusted. The quality of the plan was reviewed using [facility] information for the target and the following two organs at risk:  Urethra and Rectum.  Then the accepted plan was printed and handed off to the radiation therapist.  Under my supervision, the custom loading of the seeds and spacers was carried out and loaded into sealed vicryl sleeves.  These pre-loaded needles were then placed into the needle holder.Marland Kitchen  PROSTATE VOLUME STUDY:  Using transrectal ultrasound the  volume of the prostate was verified to be 38.1 cc.  SPECIAL TREATMENT PROCEDURE/SUPERVISION AND HANDLING: The pre-loaded needles were then delivered under sagittal guidance. A total of 20 needles were used to deposit 57 seeds in the prostate gland. The individual seed activity was 0.538 mCi.  SpaceOAR:  Yes  COMPLEX SIMULATION: At the end of the procedure, an anterior radiograph of the pelvis was obtained to document seed positioning and count. Cystoscopy was performed to check the urethra and bladder.  MICRODOSIMETRY: At the end of the procedure, the patient was emitting 0.055 mR/hr at 1 meter. Accordingly, he was considered safe for hospital discharge.  PLAN: The patient will return to the radiation oncology clinic for post implant CT dosimetry in three weeks.   ________________________________  Artist Pais Kathrynn Running, M.D.

## 2020-11-16 NOTE — Transfer of Care (Signed)
Immediate Anesthesia Transfer of Care Note  Patient: [name]  Procedure(s) Performed: RADIOACTIVE SEED IMPLANT/BRACHYTHERAPY IMPLANT (N/A Prostate) SPACE OAR INSTILLATION (N/A Rectum) CYSTOSCOPY FLEXIBLE (N/A Bladder)  Patient Location: PACU  Anesthesia Type:General  Level of Consciousness: drowsy and responds to stimulation  Airway & Oxygen Therapy: Patient Spontanous Breathing and Patient connected to face mask oxygen  Post-op Assessment: Report given to RN and Post -op Vital signs reviewed and stable  Post vital signs: Reviewed and stable  Last Vitals:  Vitals Value Taken Time  BP 107/59 11/16/20 1000  Temp 36.6 C 11/16/20 0959  Pulse 81 11/16/20 1002  Resp 12 11/16/20 1000  SpO2 97 % 11/16/20 1002  Vitals shown include unvalidated device data.  Last Pain:  Vitals:   11/16/20 0644  TempSrc: Oral  PainSc: 3       Patients Stated Pain Goal: 5 (11/16/20 3009)  Complications: No complications documented.

## 2020-11-16 NOTE — H&P (Signed)
[name] is an 74 y.o. male.    Chief Complaint: Pre-Op Prostate Brachytherapy  HPI:   1 - Moderate Risk Prostate Cancer - on surveillance since 2010 Gleason 6 2/16 by [name]. He has major CV and metabolic comorbidity   Recent Sumarrized Surveillance:  02/2020 - PSA 9.09 / DRE 40gm smooth;l  08/2020 PSA 13.2, TRUS 75mL, no medain with 5/12 cores up to 40% grade 2 cancer. Bilateral apical and lateral positivity.   2 - Stage 3 Renal Insufficiency - Cr 1.5-2's x years. diabetic and HTN. Renal US 2017 no hydro at [facility]    PMH sig for HTN, IDDM2 with some LE neuropath, CHF/Lasix, CVA x 2 (no blood thinners). 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 His [facility] Felton [name]   Today "[name]" is seen to proceed with prostate brachyherapy + SPACE-OAR for prostate cancer that has progressed on surveillance.    Past Medical History:  Diagnosis Date  . Abrasion of face    11-15-2020  per pt stated has small area under one ear,  no drainage , no dressing, putting neosporin on it  . Anemia associated with chronic renal failure   . Anxiety   . Arthritis   . Chronic constipation   . Chronic gout   . Chronic gout    per pt last episode approx. 2005  (follow[facility] )  . Chronic pruritic rash in adult    intermittant on back  . CKD (chronic kidney disease), stage III ([place])    followed by nephrologist @ [place]  . Diverticulosis of colon   . DOE (dyspnea on exertion)    long distance walking and up flight of stairs but states ok after rest couple minutes  . Dyspnea   . History of agent Orange exposure   . History of basal cell carcinoma excision    back, stomach, face  . 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 History of DVT of lower extremity   . History of kidney stones   . Hyperplasia of prostate with lower urinary tract  symptoms (LUTS)   . Hypertension    followed by [facility]cp [facility]   . OSA on CPAP    severe per study in epic 08-31-2015,  states uses nightly  . Peripheral neuropathy   . Prostate cancer [facility]) urologist--- dr [name];  oncologist--- dr [name]   first dx 02/ 2014 active survillance;  repeat bx 08-29-2020 ,  Stage T1c, Gleason 3+4  . Type 2 diabetes mellitus treated with insulin ([place])    followed by pcp @ [place]   (11-15-2020  per pt check blood sugar twice daily,  fasting sugar --- 110--120)  . Wears glasses   . Wears hearing aid in both ears    HOH even when he wears aids    Past Surgical History:  Procedure Laterality Date  . APPENDECTOMY  age 12  . BLEPHAROPLASTY Bilateral 2015   upper eyelid  . CYSTOSCOPY/RETROGRADE/URETEROSCOPY/STONE EXTRACTION WITH BASKET  2002  . MANDIBLE RECONSTRUCTION Left 2014   w/ cadaver bone  . MOHS SURGERY  2012   nose  . PILONIDAL CYST EXCISION  05/2020  . TOTAL HIP ARTHROPLASTY Right 10-26-2003  @[facility]   . TOTAL KNEE ARTHROPLASTY Right 01/22/2017   Procedure: RIGHT TOTAL KNEE ARTHROPLASTY;  Surgeon: Vincent Cancel, MD;  Location: [facility];  Service: Orthopedics;  Laterality:  Right;    Family History  Problem Relation Age of Onset  . Dementia Mother   . Hypertension Mother   . Diabetes Mother   . Heart attack Father 67  . Kidney cancer Brother        tumor removed  . Hypertension Brother   . Liver cancer Maternal Grandfather   . Testicular cancer Brother   . Cancer Paternal Aunt   . Colon cancer Neg Hx   . Colon polyps Neg Hx   . Esophageal cancer Neg Hx   . Stomach cancer Neg Hx   . Rectal cancer Neg Hx    Social History:  reports that he has never smoked. He has never used smokeless tobacco. He reports that he does not drink alcohol and does not use drugs.  Allergies: No Known Allergies  Medications Prior to Admission  Medication Sig Dispense Refill  . amitriptyline (ELAVIL) 50 MG tablet Take 50 mg by mouth at bedtime.     Marland Kitchen amLODipine (NORVASC) 10 MG  tablet Take 10 mg by mouth at bedtime.    Marland Kitchen aspirin EC 81 MG tablet Take 81 mg by mouth in the morning and at bedtime. Swallow whole.    Marland Kitchen atorvastatin (LIPITOR) 20 MG tablet Take 20 mg by mouth at bedtime.    Marland Kitchen buPROPion (WELLBUTRIN XL) 150 MG 24 hr tablet Take 150 mg by mouth daily.    . carvedilol (COREG) 12.5 MG tablet Take 12.5 mg by mouth 2 (two) times daily.    . febuxostat (ULORIC) 40 MG tablet Take 40 mg by mouth at bedtime.    . ferrous sulfate (FERROUSUL) 325 (65 FE) MG tablet Take 1 tablet (325 mg total) by mouth 3 (three) times daily with meals. (Patient taking differently: Take 325 mg by mouth 3 (three) times daily with meals.)    . insulin glargine (LANTUS) 100 UNIT/ML injection Inject 50 Units into the skin at bedtime.    Marland Kitchen lisinopril (PRINIVIL,ZESTRIL) 10 MG tablet Take 10 mg by mouth every evening.    . metFORMIN (GLUCOPHAGE) 500 MG tablet Take 500 mg by mouth 2 (two) times daily with a meal.    . colchicine 0.6 MG tablet Take 0.6 mg by mouth daily as needed (gout).    Marland Kitchen gabapentin (NEURONTIN) 300 MG capsule Take 600 mg by mouth at bedtime.    . hydrocortisone 2.5 % cream Apply 1 application topically at bedtime.    . Menthol, Topical Analgesic, (ICY HOT NATURALS) 7.5 % CREA Apply 1 application topically 2 (two) times daily as needed. 1 Tube 1  . neomycin-bacitracin-polymyxin (NEOSPORIN) ointment Apply 1 application topically as needed for wound care.    . Polyethylene Glycol 3350 (MIRALAX PO) Take by mouth as needed.    . Pyridoxine HCl (VITAMIN B-6 PO) Take 2 tablets by mouth daily.     . Tetrahydrozoline HCl (EYE DROPS OP) Place 3 drops into both eyes daily as needed (dry eyes).    . vitamin B-12 (CYANOCOBALAMIN) 1000 MCG tablet Take 1,000 mcg by mouth daily.      Results for orders placed or performed during the hospital encounter of 11/16/20 (from the past 48 hour(s))  Glucose, capillary     Status: Abnormal   Collection Time: 11/16/20  7:13 AM  Result Value Ref Range    Glucose-Capillary 131 (H) 70 - 99 mg/dL    Comment: Glucose reference range applies only to samples taken after fasting for at least 8 hours.   No results found.  Review of Systems  All other systems reviewed and are negative.   Blood pressure (!) 118/54, temperature (!) 96.9 F (36.1 C), temperature source Oral, resp. rate 18, height 5\' 10"  (1.778 m), weight 106.5 kg, SpO2 98 %. Physical Exam HENT:     Head: Normocephalic.     Nose: Nose normal.  Eyes:     Pupils: Pupils are equal, round, and reactive to light.  Cardiovascular:     Rate and Rhythm: Normal rate.  Pulmonary:     Effort: Pulmonary effort is normal.  Abdominal:     Comments: Stable truncal obesity.   Genitourinary:    Comments: NO CVAT Musculoskeletal:        General: Normal range of motion.     Cervical back: Normal range of motion.  Skin:    General: Skin is warm.  Neurological:     General: No focal deficit present.  Psychiatric:        Mood and Affect: Mood normal.      Assessment/Plan  Proceed as planned with prostate brachytherapy / SPACE-OAR. Risks, benefits, alternatives, expected peri-op course discussed previously and reiterated today.   Alexis Frock, MD 11/16/2020, 7:24 AM

## 2020-11-17 ENCOUNTER — Encounter (HOSPITAL_BASED_OUTPATIENT_CLINIC_OR_DEPARTMENT_OTHER): Payer: Self-pay | Admitting: Urology

## 2020-11-17 NOTE — Op Note (Signed)
NAME: [name], [name] YY:[id number] ACCOUNT [id number] DATE OF BIRTH:December 28, 1945 FACILITY: [facility] LOCATION: [facility] PHYSICIAN:Elmon Shader Berneice Heinrich, MD  OPERATIVE REPORT  DATE OF PROCEDURE:  11/16/2020  PREOPERATIVE DIAGNOSIS:  Moderate risk prostate cancer.  PROCEDURES: 1.  Brachytherapy seed implantation. 2.  Cystoscopy. 3.  Implantation of SpaceOAR.  ESTIMATED BLOOD LOSS:  Nil.  COMPLICATIONS:  None.  SPECIMENS:  None.  FINDINGS: 1.  Unremarkable urinary bladder following seed implantation.  No evidence of intraluminal seeds. 2.  Excellent displacement of anterior rectal wall away from posterior prostate with a SpaceOAR.  RADIATION DATA:  Radiation dose 145 Gy, 57 seeds over 20 catheters.  INDICATIONS:  The patient is a pleasant 74 year old man with known history of prostate cancer that initially was low risk.  He was appropriately placed on active surveillance and was compliant.  He unfortunately did have progression in terms of grade and  volume of disease, and his clinical course was consistent with non-indolent cancer.  Options were discussed for management including transition to curative intent therapy, and he wished to proceed.  Options of this were discussed, and he wished to  proceed with radiation with brachytherapy.  He has a favorable prostate size and anatomy for this.  Informed consent was obtained and placed in medical Glover.  DESCRIPTION OF PROCEDURE:  The patient being [name]  verified, the procedure being prostate brachytherapy seed implantation with cystoscopy and SpaceOAR was confirmed.  Procedure timeout was performed.  Intravenous antibiotics were administered.   General anesthesia was induced.  The patient was placed into a medium lithotomy position.  Foley catheter was placed free to straight drain.  The perineum was prepped.  Next, transrectal ultrasound was performed as per radiation oncology notes with final  volumetric analysis and  radiation dose planning, which optimally called for 57 seeds over 20 catheters to deliver the prescribed dose.  Next, the radiation seeds were placed as per prescribed dose in anterior to posterior direction using ultrasound  guidance.  Following this, the ultrasound was taken off of anterior tension.  The stepper was removed.  Attention was directed to spacer placement.  SpaceOAR introducer needle was placed transperineally approximately 1 cm anterior to the penis using ultrasound guidance and was advanced to the prerectal fat mid gland mid prostate.  Position was verified with 2 mL of injectable saline.  Next, 10 mL of  SpaceOAR gel matrix was placed as per manufacturer's guidelines over 10 seconds in this location, resulted in excellent displacement of the anterior rectal wall away from the posterior prostate as planned.  Next, biplanar C-arm fluoroscopic images were  obtained, which revealed seeds in expected position.  Next, in-situ Foley catheter was removed, and cystourethroscopy was performed using a 16-French flexible cystoscope.  Inspection of anterior and posterior urethra was unremarkable.  Inspection of  bladder revealed no diverticula, calcifications, papillary lesions, or radiation seeds.  Retroflexion was performed.  There were no additional findings.  The bladder was purposely kept approximately half full.  Cystoscope was withdrawn.  The procedure  was terminated.  The patient tolerated the procedure well.  No immediate complications.  The patient was taken to the postanesthesia care unit in stable condition.  Plan for discharge home after ovoid.  IN/NUANCE  D:11/16/2020 T:11/16/2020 JOB:[id number]

## 2020-12-14 ENCOUNTER — Telehealth: Payer: Self-pay | Admitting: *Deleted

## 2020-12-14 NOTE — Telephone Encounter (Signed)
Called patient to remind of post seed appts. for 12-15-20, spoke with patient's wife- Juliann Pulse and she is aware of these appts.

## 2020-12-15 ENCOUNTER — Encounter: Payer: Self-pay | Admitting: Medical Oncology

## 2020-12-15 ENCOUNTER — Other Ambulatory Visit: Payer: Self-pay

## 2020-12-15 ENCOUNTER — Encounter: Payer: Self-pay | Admitting: Urology

## 2020-12-15 ENCOUNTER — Ambulatory Visit
Admission: RE | Admit: 2020-12-15 | Discharge: 2020-12-15 | Disposition: A | Discharge status: Home / Self Care | Payer: No Typology Code available for payment source | Source: Ambulatory Visit | Attending: Urology | Admitting: Urology

## 2020-12-15 ENCOUNTER — Ambulatory Visit
Admission: RE | Admit: 2020-12-15 | Discharge: 2020-12-15 | Disposition: A | Discharge status: Home / Self Care | Payer: No Typology Code available for payment source | Source: Ambulatory Visit | Attending: Radiation Oncology | Admitting: Radiation Oncology

## 2020-12-15 VITALS — BP 141/68 | HR 80 | Temp 97.8°F | Resp 20 | Ht 70.0 in | Wt 233.2 lb

## 2020-12-15 DIAGNOSIS — C61 Malignant neoplasm of prostate: Secondary | ICD-10-CM | POA: Diagnosis not present

## 2020-12-15 DIAGNOSIS — Z79899 Other long term (current) drug therapy: Secondary | ICD-10-CM | POA: Insufficient documentation

## 2020-12-15 DIAGNOSIS — Z7982 Long term (current) use of aspirin: Secondary | ICD-10-CM | POA: Insufficient documentation

## 2020-12-15 MED ORDER — TAMSULOSIN HCL 0.4 MG PO CAPS: 0.4000 mg | ORAL_CAPSULE | Freq: Every day | ORAL | 2 refills | Status: AC

## 2020-12-15 MED ORDER — HYDROCORTISONE ACETATE 25 MG RE SUPP: 25.0000 mg | Freq: Two times a day (BID) | RECTAL | 1 refills | Status: AC

## 2020-12-15 NOTE — Progress Notes (Signed)
Radiation Oncology         [phone] ________________________________  Name: [name] MRN: [id number]  Date: 12/15/2020  DOB: 13-Jul-1946  Post-Seed Follow-Up Visit Note  CC: Clinic, Melburn Popper, MD  Diagnosis:   76 y.o. gentleman with Stage T1c adenocarcinoma of the prostate with Gleason score of 3+4, and PSA of 14.2.    ICD-10-CM   1. Malignant neoplasm of prostate (HCC)  C61     Interval Since Last Radiation:  4 weeks 11/16/20:  Insertion of radioactive I-125 seeds into the prostate gland; 145 Gy, definitive therapy with placement of SpaceOAR VUE gel.  Narrative:  The patient returns today for routine follow-up.  He is complaining of increased urinary frequency and urinary hesitation symptoms. He filled out a questionnaire regarding urinary function today providing and overall IPSS score of 24 characterizing his symptoms as severe with increased frequency, weak stream, intermittency, urgency, straining to void and nocturia 4 times per night.  He denies dysuria, gross hematuria or incontinence.  His pre-implant score was 15. He has been experiencing some bloating, abdominal discomfort, increased flatus and occasional loose stools and rectal burning.  He specifically denies diarrhea or hematochezia.  He reports a healthy appetite and is maintaining his weight.  He has not noticed any significant change in his energy level.  Overall, he is quite pleased with his progress to date.  ALLERGIES:  has No Known Allergies.  Meds: Current Outpatient Medications  Medication Sig Dispense Refill  . amitriptyline (ELAVIL) 50 MG tablet Take 50 mg by mouth at bedtime.     Marland Kitchen amLODipine (NORVASC) 10 MG tablet Take 10 mg by mouth at bedtime.    Marland Kitchen aspirin EC 81 MG tablet Take 81 mg by mouth in the morning and at bedtime. Swallow whole.    Marland Kitchen atorvastatin (LIPITOR) 20 MG tablet Take 20 mg by mouth at bedtime.    Marland Kitchen buPROPion (WELLBUTRIN XL) 150 MG 24 hr tablet Take 150 mg by  mouth daily.    . carvedilol (COREG) 12.5 MG tablet Take 12.5 mg by mouth 2 (two) times daily.    . colchicine 0.6 MG tablet Take 0.6 mg by mouth daily as needed (gout).    . febuxostat (ULORIC) 40 MG tablet Take 40 mg by mouth at bedtime.    . ferrous sulfate (FERROUSUL) 325 (65 FE) MG tablet Take 1 tablet (325 mg total) by mouth 3 (three) times daily with meals. (Patient taking differently: Take 325 mg by mouth 3 (three) times daily with meals.)    . gabapentin (NEURONTIN) 300 MG capsule Take 600 mg by mouth at bedtime.    . hydrocortisone 2.5 % cream Apply 1 application topically at bedtime.    . insulin glargine (LANTUS) 100 UNIT/ML injection Inject 50 Units into the skin at bedtime.    Marland Kitchen lisinopril (PRINIVIL,ZESTRIL) 10 MG tablet Take 10 mg by mouth every evening.    . Menthol, Topical Analgesic, (ICY HOT NATURALS) 7.5 % CREA Apply 1 application topically 2 (two) times daily as needed. 1 Tube 1  . metFORMIN (GLUCOPHAGE) 500 MG tablet Take 500 mg by mouth 2 (two) times daily with a meal.    . neomycin-bacitracin-polymyxin (NEOSPORIN) ointment Apply 1 application topically as needed for wound care.    . Polyethylene Glycol 3350 (MIRALAX PO) Take by mouth as needed.    . Pyridoxine HCl (VITAMIN B-6 PO) Take 2 tablets by mouth daily.     . tamsulosin (FLOMAX) 0.4 MG CAPS  capsule Take 1 capsule (0.4 mg total) by mouth daily as needed. For urinary urgency after prostate radiation 30 capsule 1  . Tetrahydrozoline HCl (EYE DROPS OP) Place 3 drops into both eyes daily as needed (dry eyes).    . traMADol (ULTRAM) 50 MG tablet Take 1 tablet (50 mg total) by mouth every 6 (six) hours as needed for moderate pain or severe pain. Post-operatively. 10 tablet 0  . vitamin B-12 (CYANOCOBALAMIN) 1000 MCG tablet Take 1,000 mcg by mouth daily.     No current facility-administered medications for this encounter.    Physical Findings: In general this is a well appearing Caucasian male in no acute distress. He's  alert and oriented x4 and appropriate throughout the examination. Cardiopulmonary assessment is negative for acute distress and he exhibits normal effort.   Lab Findings: Lab Results  Component Value Date   WBC 7.4 11/15/2020   HGB 10.8 (L) 11/15/2020   HCT 33.2 (L) 11/15/2020   MCV 88.1 11/15/2020   PLT 224 11/15/2020    Radiographic Findings:  Patient underwent CT imaging in our clinic for post implant dosimetry. The CT will be reviewed by Dr. Tammi Klippel to confirm there is an adequate distribution of radioactive seeds throughout the prostate gland and ensure that there are no seeds in or near the rectum. We suspect the final radiation plan and dosimetry will show appropriate coverage of the prostate gland. He understands that we will call and inform him of any unexpected findings on further review of his imaging and dosimetry.  Impression/Plan: 75 y.o. gentleman with Stage T1c adenocarcinoma of the prostate with Gleason score of 3+4, and PSA of 14.2. The patient is recovering from the effects of radiation. His urinary symptoms should gradually improve over the next 4-6 months. We talked about this today.  He will continue taking the Flomax daily until his follow-up visit with Dr. Tresa Moore in April 2022.  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  He is encouraged by his improvement already and is otherwise pleased with his outcome. We also talked about long-term follow-up for prostate cancer following seed implant. He understands that ongoing PSA determinations and digital rectal exams will help perform surveillance to rule out disease recurrence. He has a scheduled follow-up appointment with [facility] in April 2022. He understands what to expect with his PSA measures. Patient was also educated today about some of the long-term effects from radiation including a small risk for rectal bleeding and possibly erectile dysfunction. We talked about some of the  general management approaches to these potential complications. However, I did encourage the patient to contact our office or return at any point if he has questions or concerns related to his previous radiation and prostate cancer.    Nicholos Johns, PA-C

## 2020-12-15 NOTE — Progress Notes (Signed)
  Radiation Oncology         [phone] ________________________________  Name: [name] MRN: [id number]  Date: 12/15/2020  DOB: 1946-06-23  COMPLEX SIMULATION NOTE  NARRATIVE:  The patient was brought to the [facility] today following prostate seed implantation approximately one month ago.  Identity was confirmed.  All relevant records and images related to the planned course of therapy were reviewed.  Then, the patient was set-up supine.  CT images were obtained.  The CT images were loaded into the planning software.  Then the prostate and rectum were contoured.  Treatment planning then occurred.  The implanted iodine 125 seeds were identified by the physics staff for projection of radiation distribution  I have requested : 3D Simulation  I have requested a DVH of the following structures: Prostate and rectum.    ________________________________  Sheral Apley Tammi Klippel, M.D.

## 2020-12-15 NOTE — Progress Notes (Signed)
Patient is here for a follow-up post seed appointment today. Patient reports  Pain State that he is having pain in his rectum area and in his stomach. Dysuria Yes ,States that he strains with urination Hematuria None Nocturia x 3  Leakage Sometime Bowels States that he has constipation and diarrhea .States  That it relieves itself. Urine stream  Reports a weak stream Emptying Bladder States that he empties his bladder  most of the time  Urgency yes Fatigue States that he has serve fatigue Next appointment with urologist  States that it is at the end of March or the beginning  of April   IPPS score 24 Vitals:   12/15/20 1315  BP: (!) 141/68  Pulse: 80  Resp: 20  Temp: 97.8 F (36.6 C)  TempSrc: Temporal  SpO2: 100%  Weight: 105.8 kg  Height: 5\' 10"  (1.778 m)

## 2021-01-17 ENCOUNTER — Encounter: Payer: Self-pay | Admitting: Radiation Oncology

## 2021-01-17 ENCOUNTER — Ambulatory Visit
Admission: RE | Admit: 2021-01-17 | Discharge: 2021-01-17 | Disposition: A | Discharge status: Home / Self Care | Payer: No Typology Code available for payment source | Source: Ambulatory Visit | Attending: Radiation Oncology | Admitting: Radiation Oncology

## 2021-01-17 DIAGNOSIS — C61 Malignant neoplasm of prostate: Secondary | ICD-10-CM | POA: Diagnosis not present

## 2021-01-24 NOTE — Progress Notes (Signed)
  Radiation Oncology         [phone]) [phone] ________________________________  Name: [name] MRN: [id number]  Date: 01/17/2021  DOB: February 01, 1946  3D Planning Note   Prostate Brachytherapy Post-Implant Dosimetry  Diagnosis:  75 y.o. gentleman with Stage T1c adenocarcinoma of the prostate with Gleason score of 3+4, and PSA of 14.2.  Narrative: On a previous date, [name] returned following prostate seed implantation for post implant planning. He underwent CT scan complex simulation to delineate the three-dimensional structures of the pelvis and demonstrate the radiation distribution.  Since that time, the seed localization, and complex isodose planning with dose volume histograms have now been completed.  Results:   Prostate Coverage - The dose of radiation delivered to the 90% or more of the prostate gland (D90) was 94.17% of the prescription dose. This exceeds our goal of greater than 90%. Rectal Sparing - The volume of rectal tissue receiving the prescription dose or higher was 0.0 cc. This falls under our thresholds tolerance of 1.0 cc.  Impression: The prostate seed implant appears to show adequate target coverage and appropriate rectal sparing.  Plan:  The patient will continue to follow with urology for ongoing PSA determinations. I would anticipate a high likelihood for local tumor control with minimal risk for rectal morbidity.  ________________________________  Sheral Apley Tammi Klippel, M.D.

## 2021-02-02 ENCOUNTER — Other Ambulatory Visit: Payer: Self-pay | Admitting: Urology

## 2021-03-01 ENCOUNTER — Telehealth: Payer: Self-pay | Admitting: Radiation Oncology

## 2021-03-01 NOTE — Telephone Encounter (Signed)
Received voicemail message from patient's wife, [name], requesting a return call. Phoned back to inquire. [name] explains her husband had seeds implanted in December 2021. She explains he is having upper GI issues and has an appointment tomorrow morning with a gastroenterologist at the [place]. She inquired if there is a certain time frame in which the patient should wait to have a colonoscopy. Explained if her husband is symptomatic (ex.blood in stool) a time frame of 6-8 weeks is encouraged. If her husband is not symptomatic then six months is preferred. She verbalized understanding and expressed appreciation for the call back.

## 2021-03-31 ENCOUNTER — Emergency Department (HOSPITAL_COMMUNITY): Payer: No Typology Code available for payment source

## 2021-03-31 ENCOUNTER — Inpatient Hospital Stay (HOSPITAL_COMMUNITY)
Admission: EM | Admit: 2021-03-31 | Discharge: 2021-04-11 | DRG: 871 | Disposition: A | Discharge status: Home Health | Payer: No Typology Code available for payment source | Attending: Internal Medicine | Admitting: Internal Medicine

## 2021-03-31 ENCOUNTER — Encounter (HOSPITAL_COMMUNITY): Payer: Self-pay

## 2021-03-31 ENCOUNTER — Other Ambulatory Visit: Payer: Self-pay

## 2021-03-31 DIAGNOSIS — M726 Necrotizing fasciitis: Secondary | ICD-10-CM | POA: Diagnosis present

## 2021-03-31 DIAGNOSIS — Z8546 Personal history of malignant neoplasm of prostate: Secondary | ICD-10-CM | POA: Diagnosis not present

## 2021-03-31 DIAGNOSIS — K5730 Diverticulosis of large intestine without perforation or abscess without bleeding: Secondary | ICD-10-CM | POA: Diagnosis present

## 2021-03-31 DIAGNOSIS — N1831 Chronic kidney disease, stage 3a: Secondary | ICD-10-CM | POA: Diagnosis present

## 2021-03-31 DIAGNOSIS — Z96641 Presence of right artificial hip joint: Secondary | ICD-10-CM | POA: Diagnosis present

## 2021-03-31 DIAGNOSIS — I10 Essential (primary) hypertension: Secondary | ICD-10-CM | POA: Diagnosis not present

## 2021-03-31 DIAGNOSIS — M1A9XX0 Chronic gout, unspecified, without tophus (tophi): Secondary | ICD-10-CM | POA: Diagnosis present

## 2021-03-31 DIAGNOSIS — Z8051 Family history of malignant neoplasm of kidney: Secondary | ICD-10-CM

## 2021-03-31 DIAGNOSIS — N493 Fournier gangrene: Secondary | ICD-10-CM | POA: Diagnosis present

## 2021-03-31 DIAGNOSIS — Z833 Family history of diabetes mellitus: Secondary | ICD-10-CM

## 2021-03-31 DIAGNOSIS — Z7982 Long term (current) use of aspirin: Secondary | ICD-10-CM

## 2021-03-31 DIAGNOSIS — Z86718 Personal history of other venous thrombosis and embolism: Secondary | ICD-10-CM | POA: Diagnosis not present

## 2021-03-31 DIAGNOSIS — R6520 Severe sepsis without septic shock: Secondary | ICD-10-CM | POA: Diagnosis present

## 2021-03-31 DIAGNOSIS — N179 Acute kidney failure, unspecified: Secondary | ICD-10-CM | POA: Diagnosis present

## 2021-03-31 DIAGNOSIS — L02215 Cutaneous abscess of perineum: Secondary | ICD-10-CM | POA: Diagnosis present

## 2021-03-31 DIAGNOSIS — L03315 Cellulitis of perineum: Secondary | ICD-10-CM | POA: Diagnosis present

## 2021-03-31 DIAGNOSIS — R197 Diarrhea, unspecified: Secondary | ICD-10-CM | POA: Diagnosis not present

## 2021-03-31 DIAGNOSIS — E119 Type 2 diabetes mellitus without complications: Secondary | ICD-10-CM | POA: Diagnosis not present

## 2021-03-31 DIAGNOSIS — F05 Delirium due to known physiological condition: Secondary | ICD-10-CM | POA: Diagnosis not present

## 2021-03-31 DIAGNOSIS — M1990 Unspecified osteoarthritis, unspecified site: Secondary | ICD-10-CM | POA: Diagnosis present

## 2021-03-31 DIAGNOSIS — K6289 Other specified diseases of anus and rectum: Secondary | ICD-10-CM

## 2021-03-31 DIAGNOSIS — Z79899 Other long term (current) drug therapy: Secondary | ICD-10-CM

## 2021-03-31 DIAGNOSIS — U071 COVID-19: Secondary | ICD-10-CM | POA: Diagnosis present

## 2021-03-31 DIAGNOSIS — G629 Polyneuropathy, unspecified: Secondary | ICD-10-CM | POA: Diagnosis present

## 2021-03-31 DIAGNOSIS — Z8673 Personal history of transient ischemic attack (TIA), and cerebral infarction without residual deficits: Secondary | ICD-10-CM | POA: Diagnosis not present

## 2021-03-31 DIAGNOSIS — K611 Rectal abscess: Secondary | ICD-10-CM | POA: Diagnosis present

## 2021-03-31 DIAGNOSIS — N183 Chronic kidney disease, stage 3 unspecified: Secondary | ICD-10-CM

## 2021-03-31 DIAGNOSIS — Z794 Long term (current) use of insulin: Secondary | ICD-10-CM | POA: Diagnosis not present

## 2021-03-31 DIAGNOSIS — C61 Malignant neoplasm of prostate: Secondary | ICD-10-CM | POA: Diagnosis present

## 2021-03-31 DIAGNOSIS — Z85828 Personal history of other malignant neoplasm of skin: Secondary | ICD-10-CM

## 2021-03-31 DIAGNOSIS — E162 Hypoglycemia, unspecified: Secondary | ICD-10-CM | POA: Diagnosis not present

## 2021-03-31 DIAGNOSIS — Z800 Family history of malignant neoplasm of digestive organs: Secondary | ICD-10-CM

## 2021-03-31 DIAGNOSIS — G4733 Obstructive sleep apnea (adult) (pediatric): Secondary | ICD-10-CM | POA: Diagnosis present

## 2021-03-31 DIAGNOSIS — Z8616 Personal history of COVID-19: Secondary | ICD-10-CM

## 2021-03-31 DIAGNOSIS — D631 Anemia in chronic kidney disease: Secondary | ICD-10-CM | POA: Diagnosis present

## 2021-03-31 DIAGNOSIS — K603 Anal fistula: Secondary | ICD-10-CM

## 2021-03-31 DIAGNOSIS — D649 Anemia, unspecified: Secondary | ICD-10-CM | POA: Diagnosis present

## 2021-03-31 DIAGNOSIS — N189 Chronic kidney disease, unspecified: Secondary | ICD-10-CM | POA: Diagnosis present

## 2021-03-31 DIAGNOSIS — I129 Hypertensive chronic kidney disease with stage 1 through stage 4 chronic kidney disease, or unspecified chronic kidney disease: Secondary | ICD-10-CM | POA: Diagnosis present

## 2021-03-31 DIAGNOSIS — E1122 Type 2 diabetes mellitus with diabetic chronic kidney disease: Secondary | ICD-10-CM | POA: Diagnosis present

## 2021-03-31 DIAGNOSIS — Z974 Presence of external hearing-aid: Secondary | ICD-10-CM

## 2021-03-31 DIAGNOSIS — Z6831 Body mass index (BMI) 31.0-31.9, adult: Secondary | ICD-10-CM

## 2021-03-31 DIAGNOSIS — E669 Obesity, unspecified: Secondary | ICD-10-CM | POA: Diagnosis present

## 2021-03-31 DIAGNOSIS — E11649 Type 2 diabetes mellitus with hypoglycemia without coma: Secondary | ICD-10-CM | POA: Diagnosis not present

## 2021-03-31 DIAGNOSIS — Z96651 Presence of right artificial knee joint: Secondary | ICD-10-CM | POA: Diagnosis present

## 2021-03-31 DIAGNOSIS — A4151 Sepsis due to Escherichia coli [E. coli]: Principal | ICD-10-CM | POA: Diagnosis present

## 2021-03-31 DIAGNOSIS — A419 Sepsis, unspecified organism: Secondary | ICD-10-CM | POA: Diagnosis not present

## 2021-03-31 DIAGNOSIS — Z8043 Family history of malignant neoplasm of testis: Secondary | ICD-10-CM

## 2021-03-31 DIAGNOSIS — Z8249 Family history of ischemic heart disease and other diseases of the circulatory system: Secondary | ICD-10-CM

## 2021-03-31 LAB — COMPREHENSIVE METABOLIC PANEL
ALT: 26 U/L (ref 0–44)
AST: 33 U/L (ref 15–41)
Albumin: 3.7 g/dL (ref 3.5–5.0)
Alkaline Phosphatase: 134 U/L — ABNORMAL HIGH (ref 38–126)
Anion gap: 11 (ref 5–15)
BUN: 33 mg/dL — ABNORMAL HIGH (ref 8–23)
CO2: 23 mmol/L (ref 22–32)
Calcium: 9.1 mg/dL (ref 8.9–10.3)
Chloride: 102 mmol/L (ref 98–111)
Creatinine, Ser: 2.58 mg/dL — ABNORMAL HIGH (ref 0.61–1.24)
GFR, Estimated: 25 mL/min — ABNORMAL LOW (ref >60)
Glucose, Bld: 77 mg/dL (ref 70–99)
Potassium: 4.7 mmol/L (ref 3.5–5.1)
Sodium: 136 mmol/L (ref 135–145)
Total Bilirubin: 0.5 mg/dL (ref 0.3–1.2)
Total Protein: 8.4 g/dL — ABNORMAL HIGH (ref 6.5–8.1)

## 2021-03-31 LAB — CBC WITH DIFFERENTIAL/PLATELET
Abs Immature Granulocytes: 0.18 10*3/uL — ABNORMAL HIGH (ref 0.00–0.07)
Basophils Absolute: 0 10*3/uL (ref 0.0–0.1)
Basophils Relative: 0 %
Eosinophils Absolute: 0.1 10*3/uL (ref 0.0–0.5)
Eosinophils Relative: 1 %
HCT: 30.6 % — ABNORMAL LOW (ref 39.0–52.0)
Hemoglobin: 9.8 g/dL — ABNORMAL LOW (ref 13.0–17.0)
Immature Granulocytes: 1 %
Lymphocytes Relative: 11 %
Lymphs Abs: 1.7 10*3/uL (ref 0.7–4.0)
MCH: 27.8 pg (ref 26.0–34.0)
MCHC: 32 g/dL (ref 30.0–36.0)
MCV: 86.7 fL (ref 80.0–100.0)
Monocytes Absolute: 1.4 10*3/uL — ABNORMAL HIGH (ref 0.1–1.0)
Monocytes Relative: 9 %
Neutro Abs: 12.7 10*3/uL — ABNORMAL HIGH (ref 1.7–7.7)
Neutrophils Relative %: 78 %
Platelets: 242 10*3/uL (ref 150–400)
RBC: 3.53 MIL/uL — ABNORMAL LOW (ref 4.22–5.81)
RDW: 13.2 % (ref 11.5–15.5)
WBC: 16.1 10*3/uL — ABNORMAL HIGH (ref 4.0–10.5)
nRBC: 0 % (ref 0.0–0.2)

## 2021-03-31 LAB — CBG MONITORING, ED
Glucose-Capillary: 119 mg/dL — ABNORMAL HIGH (ref 70–99)
Glucose-Capillary: 77 mg/dL (ref 70–99)
Glucose-Capillary: 63 mg/dL — ABNORMAL LOW (ref 70–99)
Glucose-Capillary: 58 mg/dL — ABNORMAL LOW (ref 70–99)
Glucose-Capillary: 170 mg/dL — ABNORMAL HIGH (ref 70–99)

## 2021-03-31 LAB — RESP PANEL BY RT-PCR (FLU A&B, COVID) ARPGX2
Influenza A by PCR: NEGATIVE
Influenza B by PCR: NEGATIVE
SARS Coronavirus 2 by RT PCR: POSITIVE — AB

## 2021-03-31 LAB — PROTIME-INR
INR: 1.1 (ref 0.8–1.2)
Prothrombin Time: 14.3 seconds (ref 11.4–15.2)

## 2021-03-31 LAB — APTT: aPTT: 23 seconds — ABNORMAL LOW (ref 24–36)

## 2021-03-31 LAB — LACTIC ACID, PLASMA: Lactic Acid, Venous: 1.4 mmol/L (ref 0.5–1.9)

## 2021-03-31 MED ORDER — ONDANSETRON HCL 4 MG PO TABS: 4.0000 mg | ORAL_TABLET | Freq: Four times a day (QID) | ORAL | Status: DC | PRN

## 2021-03-31 MED ORDER — PIPERACILLIN-TAZOBACTAM 3.375 G IVPB
3.3750 g | Freq: Three times a day (TID) | INTRAVENOUS | Status: DC
  Administered 2021-04-01 – 2021-04-05 (×13): 3.375 g via INTRAVENOUS
  Filled 2021-03-31 (×12): qty 50

## 2021-03-31 MED ORDER — HYDROMORPHONE HCL 1 MG/ML IJ SOLN: 1.0000 mg | INTRAMUSCULAR | Status: DC | PRN

## 2021-03-31 MED ORDER — BUPROPION HCL ER (XL) 150 MG PO TB24
150.0000 mg | ORAL_TABLET | Freq: Every day | ORAL | Status: DC
  Administered 2021-04-01 – 2021-04-11 (×12): 150 mg via ORAL
  Filled 2021-03-31 (×11): qty 1

## 2021-03-31 MED ORDER — FEBUXOSTAT 40 MG PO TABS
40.0000 mg | ORAL_TABLET | Freq: Every day | ORAL | Status: DC
  Administered 2021-04-01 – 2021-04-10 (×10): 40 mg via ORAL
  Filled 2021-03-31 (×12): qty 1

## 2021-03-31 MED ORDER — ONDANSETRON HCL 4 MG/2ML IJ SOLN
4.0000 mg | Freq: Once | INTRAMUSCULAR | Status: AC
  Administered 2021-03-31: 4 mg via INTRAVENOUS
  Filled 2021-03-31: qty 2

## 2021-03-31 MED ORDER — DEXTROSE-NACL 5-0.45 % IV SOLN: INTRAVENOUS | Status: DC

## 2021-03-31 MED ORDER — SODIUM CHLORIDE 0.9 % IV BOLUS
1000.0000 mL | Freq: Once | INTRAVENOUS | Status: AC
  Administered 2021-03-31: 1000 mL via INTRAVENOUS

## 2021-03-31 MED ORDER — DEXTROSE 50 % IV SOLN
1.0000 | Freq: Once | INTRAVENOUS | Status: AC
  Administered 2021-03-31: 50 mL via INTRAVENOUS
  Filled 2021-03-31: qty 50

## 2021-03-31 MED ORDER — ACETAMINOPHEN 325 MG PO TABS
650.0000 mg | ORAL_TABLET | Freq: Four times a day (QID) | ORAL | Status: DC | PRN
  Administered 2021-04-01 – 2021-04-02 (×2): 650 mg via ORAL
  Filled 2021-03-31 (×2): qty 2

## 2021-03-31 MED ORDER — VANCOMYCIN HCL 2000 MG/400ML IV SOLN
2000.0000 mg | INTRAVENOUS | Status: AC
  Administered 2021-03-31: 2000 mg via INTRAVENOUS
  Filled 2021-03-31: qty 400

## 2021-03-31 MED ORDER — VANCOMYCIN HCL 1250 MG/250ML IV SOLN: 1250.0000 mg | INTRAVENOUS | Status: DC

## 2021-03-31 MED ORDER — ONDANSETRON HCL 4 MG/2ML IJ SOLN: 4.0000 mg | Freq: Four times a day (QID) | INTRAMUSCULAR | Status: DC | PRN

## 2021-03-31 MED ORDER — INSULIN ASPART 100 UNIT/ML IJ SOLN
0.0000 [IU] | INTRAMUSCULAR | Status: DC
  Administered 2021-04-01: 2 [IU] via SUBCUTANEOUS
  Administered 2021-04-01 (×2): 1 [IU] via SUBCUTANEOUS
  Administered 2021-04-01: 5 [IU] via SUBCUTANEOUS
  Administered 2021-04-01: 2 [IU] via SUBCUTANEOUS
  Administered 2021-04-02: 5 [IU] via SUBCUTANEOUS
  Administered 2021-04-02: 3 [IU] via SUBCUTANEOUS
  Administered 2021-04-02: 5 [IU] via SUBCUTANEOUS
  Administered 2021-04-02: 3 [IU] via SUBCUTANEOUS
  Administered 2021-04-02: 2 [IU] via SUBCUTANEOUS
  Administered 2021-04-02 – 2021-04-03 (×2): 3 [IU] via SUBCUTANEOUS
  Administered 2021-04-03 (×2): 2 [IU] via SUBCUTANEOUS
  Administered 2021-04-03: 3 [IU] via SUBCUTANEOUS
  Administered 2021-04-03: 1 [IU] via SUBCUTANEOUS
  Administered 2021-04-03 – 2021-04-04 (×2): 2 [IU] via SUBCUTANEOUS
  Administered 2021-04-04 – 2021-04-05 (×4): 1 [IU] via SUBCUTANEOUS
  Administered 2021-04-05 (×2): 2 [IU] via SUBCUTANEOUS
  Administered 2021-04-06: 1 [IU] via SUBCUTANEOUS
  Administered 2021-04-07: 2 [IU] via SUBCUTANEOUS
  Administered 2021-04-07: 1 [IU] via SUBCUTANEOUS
  Administered 2021-04-07 – 2021-04-08 (×4): 2 [IU] via SUBCUTANEOUS
  Filled 2021-03-31: qty 0.09

## 2021-03-31 MED ORDER — TETRAHYDROZOLINE HCL 0.05 % OP SOLN
3.0000 [drp] | Freq: Every day | OPHTHALMIC | Status: DC
  Administered 2021-04-01 – 2021-04-11 (×12): 3 [drp] via OPHTHALMIC
  Filled 2021-03-31 (×2): qty 15

## 2021-03-31 MED ORDER — PIPERACILLIN-TAZOBACTAM 3.375 G IVPB 30 MIN
3.3750 g | Freq: Once | INTRAVENOUS | Status: AC
  Administered 2021-03-31: 3.375 g via INTRAVENOUS
  Filled 2021-03-31: qty 50

## 2021-03-31 MED ORDER — HYDROMORPHONE HCL 1 MG/ML IJ SOLN
1.0000 mg | Freq: Once | INTRAMUSCULAR | Status: AC
  Administered 2021-03-31: 1 mg via INTRAVENOUS
  Filled 2021-03-31: qty 1

## 2021-03-31 MED ORDER — AMITRIPTYLINE HCL 50 MG PO TABS
50.0000 mg | ORAL_TABLET | Freq: Every day | ORAL | Status: DC
  Administered 2021-04-01 – 2021-04-10 (×10): 50 mg via ORAL
  Filled 2021-03-31: qty 1
  Filled 2021-03-31: qty 2
  Filled 2021-03-31 (×2): qty 1
  Filled 2021-03-31: qty 2
  Filled 2021-03-31 (×3): qty 1
  Filled 2021-03-31: qty 2
  Filled 2021-03-31: qty 1

## 2021-03-31 MED ORDER — PIPERACILLIN-TAZOBACTAM 3.375 G IVPB 30 MIN: 3.3750 g | Freq: Once | INTRAVENOUS | Status: DC

## 2021-03-31 MED ORDER — GABAPENTIN 300 MG PO CAPS
600.0000 mg | ORAL_CAPSULE | Freq: Every day | ORAL | Status: DC
  Administered 2021-04-01 – 2021-04-10 (×10): 600 mg via ORAL
  Filled 2021-03-31 (×10): qty 2

## 2021-03-31 MED ORDER — ATORVASTATIN CALCIUM 20 MG PO TABS
20.0000 mg | ORAL_TABLET | Freq: Every day | ORAL | Status: DC
Start: 2021-03-31 — End: 2021-04-11
  Administered 2021-04-01 – 2021-04-10 (×10): 20 mg via ORAL
  Filled 2021-03-31 (×4): qty 1
  Filled 2021-03-31 (×3): qty 2
  Filled 2021-03-31 (×3): qty 1

## 2021-03-31 MED ORDER — ACETAMINOPHEN 650 MG RE SUPP: 650.0000 mg | Freq: Four times a day (QID) | RECTAL | Status: DC | PRN

## 2021-03-31 MED ORDER — CLINDAMYCIN PHOSPHATE 600 MG/50ML IV SOLN
600.0000 mg | Freq: Three times a day (TID) | INTRAVENOUS | Status: DC
  Administered 2021-03-31 – 2021-04-03 (×8): 600 mg via INTRAVENOUS
  Filled 2021-03-31 (×8): qty 50

## 2021-03-31 MED ORDER — PIPERACILLIN-TAZOBACTAM 4.5 G IVPB: 4.5000 g | Freq: Once | INTRAVENOUS | Status: DC

## 2021-03-31 MED ORDER — FERROUS SULFATE 325 (65 FE) MG PO TABS
325.0000 mg | ORAL_TABLET | Freq: Every day | ORAL | Status: DC
  Administered 2021-04-02 – 2021-04-05 (×4): 325 mg via ORAL
  Filled 2021-03-31 (×4): qty 1

## 2021-03-31 NOTE — ED Notes (Signed)
Condom cath placed on pt at this time 

## 2021-03-31 NOTE — Progress Notes (Signed)
Pharmacy Antibiotic Note  [name] is a 75 y.o. male admitted on 03/31/2021 with Fournier's gangrene.  PMH significant for prostate cancer, recent Covid. Pharmacy has been consulted for Vancomycin and Zosyn dosing.  Plan: Vancomycin 2000mg  IV x 1 followed by 1250 mg IV Q 48 hrs. Goal AUC 400-550.  Expected AUC: 484.9  SCr used: 2.58  Zosyn 3.375gm IV q8h (each dose infused over 4 hours)  Clindamycin per MD  Daily SCr while on both Vancomycin and Zosyn  Height: 5\' 10"  (177.8 cm) Weight: 100.7 kg (222 lb) IBW/kg (Calculated) : 73  Temp (24hrs), Avg:99.7 F (37.6 C), Min:99.3 F (37.4 C), Max:100 F (37.8 C)  Recent Labs  Lab 03/31/21 1800  WBC 16.1*  CREATININE 2.58*  LATICACIDVEN 1.4    Estimated Creatinine Clearance: 29.4 mL/min (A) (by C-G formula based on SCr of 2.58 mg/dL (H)).    No Known Allergies  Antimicrobials this admission: 5/13 Clindamycin >>   5/13 Zosyn >>   5/13 Vancomycin >>  Dose adjustments this admission:    Microbiology results: 5/13 BCx:   5/13 Covid: positive  Thank you for allowing pharmacy to be a part of this patient's care.  Everette Rank, PharmD 03/31/2021 10:55 PM

## 2021-03-31 NOTE — ED Notes (Signed)
Attempted to draw a second set of blood cultures however was unable to get blood return. Charge RN to attempt and then will start antibiotic

## 2021-03-31 NOTE — ED Notes (Signed)
Patient transported to CT 

## 2021-03-31 NOTE — H&P (Signed)
History and Physical    [name] MHD:[id number] DOB: Sep 02, 1946 DOA: 03/31/2021  PCP: [facility]  Patient coming from: Home  I have personally briefly reviewed patient's old medical records in [place]  Chief Complaint: Cellulitis  HPI: [name] is a 75 y.o. male with medical history significant of prostate CA s/p seeds, CKD 3, previous peri-rectal abscess without fistula, HTN, DM2.  Pt with h/o COVID x2 weeks ago, COVID symptoms resolved.  Pt with c/o 5 day h/o rectal pain, worsening, severe, 10/10.  Went to urology office who noted fever 101.4, sent him in to ED.  Associated scrotal pain and swelling as well.  Diarrhea.  No abd pain, CP.   ED Course: COVID+  WBC 16.1k.  Creat 2.5 up from 1.6 baseline.  CT A/P: soft tissue gas involving L and midline of perineum extending to posterior base of penis.  No definate connection to anorectum.  Concern for necrotizing fasciitis.  Lactate nl, no hypotension  Tm 100.  Review of Systems: As per HPI, otherwise all review of systems negative.  Past Medical History:  Diagnosis Date  . Abrasion of face    11-15-2020  per pt stated has small area under one ear,  no drainage , no dressing, putting neosporin on it  . Anemia associated with chronic renal failure   . Anxiety   . Arthritis   . Chronic constipation   . Chronic gout   . Chronic gout    per pt last episode approx. 2005  (followed [place] [facility] )  . Chronic pruritic rash in adult    intermittant on back  . CKD (chronic kidney disease), stage III ([place])    followed by nephrologist @ [place]  . Diverticulosis of colon   . DOE (dyspnea on exertion)    long distance walking and up flight of stairs but states ok after rest couple minutes  . Dyspnea   . History of agent Orange exposure   . History of basal cell carcinoma excision    back, stomach, face  . 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 History of DVT of lower extremity   . History of kidney stones   . Hyperplasia of prostate with lower urinary tract symptoms (LUTS)   . Hypertension    followed [place]cp [facility]   . OSA on CPAP    severe per study in epic 08-31-2015,  states uses nightly  . Peripheral neuropathy   . Prostate cancer [facility]) urologist--- dr Tresa Moore;  oncologist--- dr Tammi Klippel   first dx 02/ 2014 active survillance;  repeat bx 08-29-2020 ,  Stage T1c, Gleason 3+4  . Type 2 diabetes mellitus treated with insulin ([place])    followed by pcp @ [place]   (11-15-2020  per pt check blood sugar twice daily,  fasting sugar --- 110--120)  . Wears glasses   . Wears hearing aid in both ears    HOH even when he wears aids    Past Surgical History:  Procedure Laterality Date  . APPENDECTOMY  age 58  . BLEPHAROPLASTY Bilateral 2015   upper eyelid  . CYSTOSCOPY N/A 11/16/2020   Procedure: CYSTOSCOPY FLEXIBLE;  Surgeon: Alexis Frock, MD;  Location: [facility];  Service: Urology;  Laterality: N/A;  No seeds detected in bladder per Dr. Tresa Moore  . CYSTOSCOPY/RETROGRADE/URETEROSCOPY/STONE EXTRACTION WITH BASKET  2002  . MANDIBLE  RECONSTRUCTION Left 2014   w/ cadaver bone  . MOHS SURGERY  2012   nose  . PILONIDAL CYST EXCISION  05/2020  . RADIOACTIVE SEED IMPLANT N/A 11/16/2020   Procedure: RADIOACTIVE SEED IMPLANT/BRACHYTHERAPY IMPLANT;  Surgeon: Alexis Frock, MD;  Location: [facility];  Service: Urology;  Laterality: N/A;  90 MINS  . SPACE OAR INSTILLATION N/A 11/16/2020   Procedure: SPACE OAR INSTILLATION;  Surgeon: Alexis Frock, MD;  Location: [facility];  Service: Urology;  Laterality: N/A;  . TOTAL HIP ARTHROPLASTY Right 10-26-2003  @[facility]   . TOTAL KNEE ARTHROPLASTY Right 01/22/2017   Procedure: RIGHT TOTAL KNEE ARTHROPLASTY;  Surgeon: Paralee Cancel, MD;  Location: [facility];  Service: Orthopedics;   Laterality: Right;     reports that he has never smoked. He has never used smokeless tobacco. He reports that he does not drink alcohol and does not use drugs.  No Known Allergies  Family History  Problem Relation Age of Onset  . Dementia Mother   . Hypertension Mother   . Diabetes Mother   . Heart attack Father 1  . Kidney cancer Brother        tumor removed  . Hypertension Brother   . Liver cancer Maternal Grandfather   . Testicular cancer Brother   . Cancer Paternal Aunt   . Colon cancer Neg Hx   . Colon polyps Neg Hx   . Esophageal cancer Neg Hx   . Stomach cancer Neg Hx   . Rectal cancer Neg Hx      Prior to Admission medications   Medication Sig Start Date End Date Taking? Authorizing Provider  acetaminophen (TYLENOL) 500 MG tablet Take 1,000 mg by mouth every 6 (six) hours as needed for moderate pain.   Yes [provider]  amitriptyline (ELAVIL) 50 MG tablet Take 50 mg by mouth at bedtime.  10/06/11  Yes [provider]  amLODipine (NORVASC) 10 MG tablet Take 10 mg by mouth at bedtime.   Yes [provider]  aspirin EC 81 MG tablet Take 162 mg by mouth daily. Swallow whole.   Yes [provider]  atorvastatin (LIPITOR) 20 MG tablet Take 20 mg by mouth at bedtime.   Yes [provider]  buPROPion (WELLBUTRIN XL) 150 MG 24 hr tablet Take 150 mg by mouth daily.   Yes [provider]  camphor-menthol Timoteo Ace) lotion Apply 1 application topically daily. 09/05/20  Yes [provider]  carvedilol (COREG) 12.5 MG tablet Take 12.5 mg by mouth daily.   Yes [provider]  Cholecalciferol 50 MCG (2000 UT) TABS Take 2,000 tablets by mouth daily. 09/05/20  Yes [provider]  colchicine 0.6 MG tablet Take 0.6 mg by mouth daily as needed (gout).   Yes [provider]  febuxostat (ULORIC) 40 MG tablet Take 40 mg by mouth at bedtime.   Yes [provider]  ferrous sulfate (FERROUSUL)  325 (65 FE) MG tablet Take 1 tablet (325 mg total) by mouth 3 (three) times daily with meals. Patient taking differently: Take 325 mg by mouth daily. 01/22/17  Yes Babish, Rodman Key, PA-C  gabapentin (NEURONTIN) 300 MG capsule Take 600 mg by mouth at bedtime.   Yes [provider]  hydrocortisone (ANUSOL-HC) 25 MG suppository Place 1 suppository (25 mg total) rectally 2 (two) times daily. 12/15/20  Yes Bruning, Ashlyn, PA-C  hydrocortisone 2.5 % cream Apply 1 application topically at bedtime.   Yes [provider]  insulin glargine (LANTUS)  100 UNIT/ML injection Inject 45 Units into the skin at bedtime. 09/22/12  Yes Viyuoh, Adeline C, MD  lisinopril (PRINIVIL,ZESTRIL) 10 MG tablet Take 10 mg by mouth every evening.   Yes [provider]  neomycin-bacitracin-polymyxin (NEOSPORIN) ointment Apply 1 application topically daily as needed for wound care.   Yes [provider]  Pyridoxine HCl (VITAMIN B-6 PO) Take 2 tablets by mouth daily.    Yes [provider]  tamsulosin (FLOMAX) 0.4 MG CAPS capsule Take 1 capsule (0.4 mg total) by mouth daily after supper. 12/15/20  Yes Bruning, Ashlyn, PA-C  Tetrahydrozoline HCl (EYE DROPS OP) Place 3 drops into both eyes daily.   Yes [provider]  vitamin B-12 (CYANOCOBALAMIN) 1000 MCG tablet Take 1,000 mcg by mouth daily.   Yes [provider]    Physical Exam: Vitals:   03/31/21 2115 03/31/21 2130 03/31/21 2145 03/31/21 2200  BP:  (!) 145/69  (!) 115/101  Pulse: 77 78 82 76  Resp: 18 (!) 22 (!) 25 18  Temp:      TempSrc:      SpO2: 100% 100% 95% 98%  Weight:      Height:        Constitutional: NAD, calm, comfortable Eyes: PERRL, lids and conjunctivae normal ENMT: Mucous membranes are moist. Posterior pharynx clear of any exudate or lesions.Normal dentition.  Neck: normal, supple, no masses, no thyromegaly Respiratory: clear to auscultation bilaterally, no wheezing, no crackles. Normal  respiratory effort. No accessory muscle use.  Cardiovascular: Regular rate and rhythm, no murmurs / rubs / gallops. No extremity edema. 2+ pedal pulses. No carotid bruits.  Abdomen: no tenderness, no masses palpated. No hepatosplenomegaly. Bowel sounds positive.  Musculoskeletal: no clubbing / cyanosis. No joint deformity upper and lower extremities. Good ROM, no contractures. Normal muscle tone.  Skin: no rashes, lesions, ulcers. No induration Neurologic: CN 2-12 grossly intact. Sensation intact, DTR normal. Strength 5/5 in all 4.  Psychiatric: Normal judgment and insight. Alert and oriented x 3. Normal mood.    Labs on Admission: I have personally reviewed following labs and imaging studies  CBC: Recent Labs  Lab 03/31/21 1800  WBC 16.1*  NEUTROABS 12.7*  HGB 9.8*  HCT 30.6*  MCV 86.7  PLT [id number]   Basic Metabolic Panel: Recent Labs  Lab 03/31/21 1800  NA 136  K 4.7  CL 102  CO2 23  GLUCOSE 77  BUN 33*  CREATININE 2.58*  CALCIUM 9.1   GFR: Estimated Creatinine Clearance: 29.4 mL/min (A) (by C-G formula based on SCr of 2.58 mg/dL (H)). Liver Function Tests: Recent Labs  Lab 03/31/21 1800  AST 33  ALT 26  ALKPHOS 134*  BILITOT 0.5  PROT 8.4*  ALBUMIN 3.7   No results for input(s): LIPASE, AMYLASE in the last 168 hours. No results for input(s): AMMONIA in the last 168 hours. Coagulation Profile: Recent Labs  Lab 03/31/21 1800  INR 1.1   Cardiac Enzymes: No results for input(s): CKTOTAL, CKMB, CKMBINDEX, TROPONINI in the last 168 hours. BNP (last 3 results) No results for input(s): PROBNP in the last 8760 hours. HbA1C: No results for input(s): HGBA1C in the last 72 hours. CBG: Recent Labs  Lab 03/31/21 1438 03/31/21 1538 03/31/21 1803 03/31/21 2054  GLUCAP 119* 77 63* 58*   Lipid Profile: No results for input(s): CHOL, HDL, LDLCALC, TRIG, CHOLHDL, LDLDIRECT in the last 72 hours. Thyroid Function Tests: No results for input(s): TSH, T4TOTAL, FREET4,  T3FREE, THYROIDAB in the last 72 hours.  Anemia Panel: No results for input(s): VITAMINB12, FOLATE, FERRITIN, TIBC, IRON, RETICCTPCT in the last 72 hours. Urine analysis:    Component Value Date/Time   COLORURINE YELLOW 02/12/2018 2351   APPEARANCEUR CLEAR 02/12/2018 2351   LABSPEC 1.013 02/12/2018 2351   PHURINE 5.0 02/12/2018 2351   GLUCOSEU NEGATIVE 02/12/2018 2351   HGBUR NEGATIVE 02/12/2018 2351   BILIRUBINUR NEGATIVE 02/12/2018 2351   KETONESUR NEGATIVE 02/12/2018 2351   PROTEINUR NEGATIVE 02/12/2018 2351   UROBILINOGEN 0.2 09/17/2012 1837   NITRITE NEGATIVE 02/12/2018 2351   LEUKOCYTESUR NEGATIVE 02/12/2018 2351    Radiological Exams on Admission: CT Abdomen Pelvis Wo Contrast  Result Date: 03/31/2021 CLINICAL DATA:  Abdominal pain and fever. Prostate cancer with seeds. Acute kidney injury. Rectal pain and fissure. EXAM: CT ABDOMEN AND PELVIS WITHOUT CONTRAST TECHNIQUE: Multidetector CT imaging of the abdomen and pelvis was performed following the standard protocol without IV contrast. COMPARISON:  Abdominopelvic CT 09/17/2012 FINDINGS: Lower chest: Bibasilar atelectasis. No pleural fluid. Coronary artery calcification or stents. Small hiatal hernia. Hepatobiliary: No focal hepatic abnormality on this noncontrast exam. Depending gallstones. There may be a also minimal wall calcification of the gallbladder fundus. No pericholecystic fat stranding. No biliary dilatation. Pancreas: Mild fatty atrophy.  No ductal dilatation or inflammation. Spleen: Normal in size without focal abnormality. Adrenals/Urinary Tract: Normal adrenal glands. No hydronephrosis. No renal calculi. Trace bilateral perinephric edema, likely chronic. Decompressed ureters. No bladder wall thickening. Stomach/Bowel: Punctate metallic density in the stomach may be a clip. Small hiatal hernia. No small bowel obstruction, wall thickening, or inflammation. Appendix is not confidently visualized. No appendicitis. Metallic  density in the cecum may represent a surgical clip. Moderate volume of colonic stool. Sigmoid colon is redundant. There is sigmoid colonic diverticulosis without diverticulitis. Vascular/Lymphatic: Aortic atherosclerosis. No aortic aneurysm. Prominent periportal nodes are likely reactive. Scattered bilateral inguinal nodes also likely reactive. Reproductive: Brachytherapy seeds in the prostate. Large right hydrocele. Other: There is soft tissue gas involving the left and midline of the perineum, extending to the base of the penis. No minimal adjacent ill-defined fluid. Soft tissue stranding which extends to the left gluteal crease. There is no definitive connection to the anorectum. No intra-abdominopelvic fluid collection. Small fat containing umbilical hernia. Fat containing bilateral inguinal hernias. Skin thickening and soft tissue edema of the left anterior abdominal wall typical of medication injection sites. Musculoskeletal: Right hip arthroplasty. No focal bone lesion or acute osseous abnormality. Lumbar degenerative change. IMPRESSION: 1. Soft tissue gas involving the left and midline of the perineum, extending to the posterior base of the penis. Soft tissue stranding which extends to the left gluteal crease. There is no definitive connection to the anorectum. While findings may represent an ill-defined perirectal fistula, the possibility of necrotizing fasciitis is raised given the patchy soft tissue air. 2. Cholelithiasis without CT findings of acute cholecystitis. 3. Colonic diverticulosis without diverticulitis. 4. Small hiatal hernia. 5. Fat containing bilateral inguinal hernias. Aortic Atherosclerosis (ICD10-I70.0). These results were called by telephone at the time of interpretation on 03/31/2021 at 8:51 pm to provider Vanetta MuldersSCOTT ZACKOWSKI , who verbally acknowledged these results. Electronically Signed   By: Narda RutherfordMelanie  Sanford M.D.   On: 03/31/2021 20:51   DG Chest Port 1 View  Result Date:  03/31/2021 CLINICAL DATA:  Weakness and fever. Recent COVID-19 positive. History of prostate carcinoma. EXAM: PORTABLE CHEST 1 VIEW COMPARISON:  October 20, 2020 chest radiograph; CT chest January 20, 2021 FINDINGS: There is mild scarring in the left base. Lungs elsewhere are  clear. Heart size and pulmonary vascularity are normal. No adenopathy. No bone lesions. IMPRESSION: Mild scarring left base. No edema or airspace opacity. Heart size within normal limits. Electronically Signed   By: Lowella Grip III M.D.   On: 03/31/2021 15:23    EKG: Independently reviewed.  Assessment/Plan Principal Problem:   Fournier's gangrene Active Problems:   DM (diabetes mellitus) ([place])   CKD (chronic kidney disease)   HTN (hypertension)   Malignant neoplasm of prostate ([place])   Cellulitis of perineum   AKI (acute kidney injury) ([place])    1. Cellulitis of perineum with [place] air, severe pain, SIRS - 1. Findings worrisome for early / developing fournier's gangrene. 2. EDP spoke with gen surg and urology 1. Doesn't sound like OR planned for tonight 3. Will keep PT NPO after MN 4. Empiric treatment with zosyn / vanc / clinda 5. Admit to SDU 6. Pain ctrl with PRN dilaudid 2. DM2 - 1. Hypoglycemic despite not taking insulin last evening. 2. D5half NS as IVF for the moment 3. Sensitive SSI Q4H for the moment 4. Holding home lantus 3. HTN - 1. Holding home BP meds due to high risk of developing sepsis in patient with possibly developing fourniers gangrene 4. AKI on CKD 3 - 1. IVF as above 2. Strict intake and output 3. Repeat labs in AM 4. No mention of obstruction on CT 5. Prostate CA - 1. S/p seed  DVT prophylaxis: SCDs for the moment - high risk to need surgery tomorrow Code Status: Full Family Communication: Wife at bedside Disposition Plan: Home after management of cellulitis / fourniers. Consults called: EDP spoke with urology and gen surg Admission status: Admit to inpatient  Severity of  Illness: The appropriate patient status for this patient is INPATIENT. Inpatient status is judged to be reasonable and necessary in order to provide the required intensity of service to ensure the patient's safety. The patient's presenting symptoms, physical exam findings, and initial radiographic and laboratory data in the context of their chronic comorbidities is felt to place them at high risk for further clinical deterioration. Furthermore, it is not anticipated that the patient will be medically stable for discharge from the hospital within 2 midnights of admission. The following factors support the patient status of inpatient.   IP status due to: 1) cellulitis of perineum, findings are worrisome for early / risk of development into fournier's gangrene (clearly needs IV ABx, surgical consultation, etc). 2) AKI   * I certify that at the point of admission it is my clinical judgment that the patient will require inpatient hospital care spanning beyond 2 midnights from the point of admission due to high intensity of service, high risk for further deterioration and high frequency of surveillance required.*    Chelby Salata M. DO [facility]  How to contact the [facility] Attending or Consulting provider [place] or covering provider during after hours [place], for this patient?  1. Check the care team in [facility] and look for a) attending/consulting [facility] provider listed and b) the [facility] team listed 2. Log into [email or web address]  Amion Physician Scheduling and messaging for groups and whole hospitals  On call and physician scheduling software for group practices, residents, hospitalists and other medical providers for call, clinic, rotation and shift schedules. On[employer] is a hospital-wide system for scheduling doctors and paging doctors on call. EasyPlot is for scientific plotting and data analysis.  [email or web address]  and use [place]'s universal password to access. If you do  not have the password,  please contact the hospital operator.  3. Locate the [facility] provider you are looking for under [facility] and page to a number that you can be directly reached. 4. If you still have difficulty reaching the provider, please page the [facility] (Director on Call) for the Hospitalists listed on amion for assistance.  03/31/2021, 11:11 PM

## 2021-03-31 NOTE — ED Provider Notes (Signed)
[facility] DEPT Provider Note   CSN: [id number] Arrival date & time: 03/31/21  1419     History No chief complaint on file.   [name] is a 75 y.o. male.  Patient sent from urology clinic for complaint of rectal pain.  There had a temp of 101.4.  His blood sugar was 55.  He was given Tylenol 2 mg of morphine and D10.  He is followed by them for history of prostate cancer and radioactive seeds that were placed in December.  Also patient was diagnosed with COVID 2 weeks ago.  Also in July in the [place] system had an I&D of either a perianal or perirectal abscess.  Patient started with significant rectal pain several days ago.  Worse the past 5 days with much worse last night.  Patient denies any abdominal pain.        Past Medical History:  Diagnosis Date  . Abrasion of face    11-15-2020  per pt stated has small area under one ear,  no drainage , no dressing, putting neosporin on it  . Anemia associated with chronic renal failure   . Anxiety   . Arthritis   . Chronic constipation   . Chronic gout   . Chronic gout    per pt last episode approx. 2005  (followed by [facility]cp @[facility] )  . Chronic pruritic rash in adult    intermittant on back  . CKD (chronic kidney disease), stage III ([place])    followed by nephrologist @ [facility]  . Diverticulosis of colon   . DOE (dyspnea on exertion)    long distance walking and up flight of stairs but states ok after rest couple minutes  . Dyspnea   . History of agent Orange exposure   . History of basal cell carcinoma excision    back, stomach, face  . 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 History of DVT of lower extremity   . History of kidney stones   . Hyperplasia of prostate with lower urinary tract symptoms (LUTS)   . Hypertension    followed by [facility]cp @[facility]   . OSA on CPAP     severe per study in epic 08-31-2015,  states uses nightly  . Peripheral neuropathy   . Prostate cancer [facility]) urologist--- dr Tresa Moore;  oncologist--- dr Tammi Klippel   first dx 02/ 2014 active survillance;  repeat bx 08-29-2020 ,  Stage T1c, Gleason 3+4  . Type 2 diabetes mellitus treated with insulin ([place])    followed by pcp @ [place] [facility]   (11-15-2020  per pt check blood sugar twice daily,  fasting sugar --- 110--120)  . Wears glasses   . Wears hearing aid in both ears    HOH even when he wears aids    Patient Active Problem List   Diagnosis Date Noted  . Fournier's gangrene 03/31/2021  . Cellulitis of perineum 03/31/2021  . AKI (acute kidney injury) ([place]) 03/31/2021  . Malignant neoplasm of prostate ([place]) 10/04/2020  . 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 Cryptogenic stroke ([place]) 08/03/2014  . Gout 03/13/2014  . CVA (cerebral infarction) 03/12/2014  . Fever 09/18/2012  . DM (diabetes mellitus) ([place]) 09/18/2012  . CKD (chronic kidney disease) 09/18/2012  . Anemia 09/18/2012  . HTN (hypertension) 09/18/2012  . Hyperlipidemia 09/18/2012  . Anemia associated with chronic renal failure 10/18/2011    Past  Surgical History:  Procedure Laterality Date  . APPENDECTOMY  age 26  . BLEPHAROPLASTY Bilateral 2015   upper eyelid  . CYSTOSCOPY N/A 11/16/2020   Procedure: CYSTOSCOPY FLEXIBLE;  Surgeon: Alexis Frock, MD;  Location: [facility];  Service: Urology;  Laterality: N/A;  No seeds detected in bladder per Dr. Tresa Moore  . CYSTOSCOPY/RETROGRADE/URETEROSCOPY/STONE EXTRACTION WITH BASKET  2002  . MANDIBLE RECONSTRUCTION Left 2014   w/ cadaver bone  . MOHS SURGERY  2012   nose  . PILONIDAL CYST EXCISION  05/2020  . RADIOACTIVE SEED IMPLANT N/A 11/16/2020   Procedure: RADIOACTIVE SEED IMPLANT/BRACHYTHERAPY IMPLANT;  Surgeon: Alexis Frock, MD;  Location[facility];  Service: Urology;  Laterality: N/A;  90 MINS  . SPACE OAR INSTILLATION N/A 11/16/2020    Procedure: SPACE OAR INSTILLATION;  Surgeon: Alexis Frock, MD;  Location: [facility];  Service: Urology;  Laterality: N/A;  . TOTAL HIP ARTHROPLASTY Right 10-26-2003  @[facility]   . TOTAL KNEE ARTHROPLASTY Right 01/22/2017   Procedure: RIGHT TOTAL KNEE ARTHROPLASTY;  Surgeon: Paralee Cancel, MD;  Location: [facility];  Service: Orthopedics;  Laterality: Right;       Family History  Problem Relation Age of Onset  . Dementia Mother   . Hypertension Mother   . Diabetes Mother   . Heart attack Father 47  . Kidney cancer Brother        tumor removed  . Hypertension Brother   . Liver cancer Maternal Grandfather   . Testicular cancer Brother   . Cancer Paternal Aunt   . Colon cancer Neg Hx   . Colon polyps Neg Hx   . Esophageal cancer Neg Hx   . Stomach cancer Neg Hx   . Rectal cancer Neg Hx     Social History   Tobacco Use  . Smoking status: Never Smoker  . Smokeless tobacco: Never Used  Vaping Use  . Vaping Use: Never used  Substance Use Topics  . Alcohol use: No  . Drug use: Never    Home Medications Prior to Admission medications   Medication Sig Start Date End Date Taking? Authorizing Provider  acetaminophen (TYLENOL) 500 MG tablet Take 1,000 mg by mouth every 6 (six) hours as needed for moderate pain.   Yes [provider]  amitriptyline (ELAVIL) 50 MG tablet Take 50 mg by mouth at bedtime.  10/06/11  Yes [provider]  amLODipine (NORVASC) 10 MG tablet Take 10 mg by mouth at bedtime.   Yes [provider]  aspirin EC 81 MG tablet Take 162 mg by mouth daily. Swallow whole.   Yes [provider]  atorvastatin (LIPITOR) 20 MG tablet Take 20 mg by mouth at bedtime.   Yes [provider]  buPROPion (WELLBUTRIN XL) 150 MG 24 hr tablet Take 150 mg by mouth daily.   Yes [provider]  camphor-menthol Timoteo Ace) lotion Apply 1 application topically daily. 09/05/20  Yes [provider]  carvedilol (COREG) 12.5  MG tablet Take 12.5 mg by mouth daily.   Yes [provider]  Cholecalciferol 50 MCG (2000 UT) TABS Take 2,000 tablets by mouth daily. 09/05/20  Yes [provider]  colchicine 0.6 MG tablet Take 0.6 mg by mouth daily as needed (gout).   Yes [provider]  febuxostat (ULORIC) 40 MG tablet Take 40 mg by mouth at bedtime.   Yes [provider]  ferrous sulfate (FERROUSUL) 325 (65 FE) MG tablet Take 1 tablet (325 mg total) by  mouth 3 (three) times daily with meals. Patient taking differently: Take 325 mg by mouth daily. 01/22/17  Yes Babish, Rodman Key, PA-C  gabapentin (NEURONTIN) 300 MG capsule Take 600 mg by mouth at bedtime.   Yes [provider]  hydrocortisone (ANUSOL-HC) 25 MG suppository Place 1 suppository (25 mg total) rectally 2 (two) times daily. 12/15/20  Yes Bruning, Ashlyn, PA-C  hydrocortisone 2.5 % cream Apply 1 application topically at bedtime.   Yes [provider]  insulin glargine (LANTUS) 100 UNIT/ML injection Inject 45 Units into the skin at bedtime. 09/22/12  Yes Viyuoh, Adeline C, MD  lisinopril (PRINIVIL,ZESTRIL) 10 MG tablet Take 10 mg by mouth every evening.   Yes [provider]  neomycin-bacitracin-polymyxin (NEOSPORIN) ointment Apply 1 application topically daily as needed for wound care.   Yes [provider]  Pyridoxine HCl (VITAMIN B-6 PO) Take 2 tablets by mouth daily.    Yes [provider]  tamsulosin (FLOMAX) 0.4 MG CAPS capsule Take 1 capsule (0.4 mg total) by mouth daily after supper. 12/15/20  Yes Bruning, Ashlyn, PA-C  Tetrahydrozoline HCl (EYE DROPS OP) Place 3 drops into both eyes daily.   Yes [provider]  vitamin B-12 (CYANOCOBALAMIN) 1000 MCG tablet Take 1,000 mcg by mouth daily.   Yes [provider]    Allergies    Patient has no known allergies.  Review of Systems   Review of Systems  Constitutional: Positive for fever. Negative for chills.  HENT:  Negative for rhinorrhea and sore throat.   Eyes: Negative for visual disturbance.  Respiratory: Negative for cough and shortness of breath.   Cardiovascular: Negative for chest pain and leg swelling.  Gastrointestinal: Positive for rectal pain. Negative for abdominal pain, diarrhea, nausea and vomiting.  Genitourinary: Negative for dysuria.  Musculoskeletal: Negative for back pain and neck pain.  Skin: Negative for rash.  Neurological: Negative for dizziness, light-headedness and headaches.  Hematological: Does not bruise/bleed easily.  Psychiatric/Behavioral: Negative for confusion.    Physical Exam Updated Vital Signs BP (!) 115/101   Pulse 76   Temp 99.3 F (37.4 C)   Resp 18   Ht 1.778 m (5\' 10" )   Wt 100.7 kg   SpO2 98%   BMI 31.85 kg/m   Physical Exam Vitals and nursing note reviewed.  Constitutional:      Appearance: Normal appearance. He is well-developed.  HENT:     Head: Normocephalic and atraumatic.  Eyes:     Extraocular Movements: Extraocular movements intact.     Conjunctiva/sclera: Conjunctivae normal.     Pupils: Pupils are equal, round, and reactive to light.  Cardiovascular:     Rate and Rhythm: Normal rate and regular rhythm.     Heart sounds: No murmur heard.   Pulmonary:     Effort: Pulmonary effort is normal. No respiratory distress.     Breath sounds: Normal breath sounds.  Abdominal:     Palpations: Abdomen is soft.     Tenderness: There is no abdominal tenderness.  Genitourinary:    Penis: Normal.      Comments: Some swelling to the right scrotal area.  Left scrotum not swelling.  Some tenderness to the posterior aspect of the scrotum with some erythema.  No tenderness or induration to both groin area or to the base of the penis.  Rectal exam no fissures your no external hemorrhoids.  No induration in the perianal area.  Rectal exam without any significant tenderness. Musculoskeletal:  General: Normal range of motion.     Cervical  back: Normal range of motion and neck supple.  Skin:    General: Skin is warm and dry.     Capillary Refill: Capillary refill takes less than 2 seconds.  Neurological:     General: No focal deficit present.     Mental Status: He is alert and oriented to person, place, and time.     Cranial Nerves: No cranial nerve deficit.     Sensory: No sensory deficit.     Motor: No weakness.     ED Results / Procedures / Treatments   Labs (all labs ordered are listed, but only abnormal results are displayed) Labs Reviewed  RESP PANEL BY RT-PCR (FLU A&B, COVID) ARPGX2 - Abnormal; Notable for the following components:      Result Value   SARS Coronavirus 2 by RT PCR POSITIVE (*)    All other components within normal limits  COMPREHENSIVE METABOLIC PANEL - Abnormal; Notable for the following components:   BUN 33 (*)    Creatinine, Ser 2.58 (*)    Total Protein 8.4 (*)    Alkaline Phosphatase 134 (*)    GFR, Estimated 25 (*)    All other components within normal limits  CBC WITH DIFFERENTIAL/PLATELET - Abnormal; Notable for the following components:   WBC 16.1 (*)    RBC 3.53 (*)    Hemoglobin 9.8 (*)    HCT 30.6 (*)    Neutro Abs 12.7 (*)    Monocytes Absolute 1.4 (*)    Abs Immature Granulocytes 0.18 (*)    All other components within normal limits  APTT - Abnormal; Notable for the following components:   aPTT 23 (*)    All other components within normal limits  CBG MONITORING, ED - Abnormal; Notable for the following components:   Glucose-Capillary 119 (*)    All other components within normal limits  CBG MONITORING, ED - Abnormal; Notable for the following components:   Glucose-Capillary 63 (*)    All other components within normal limits  CBG MONITORING, ED - Abnormal; Notable for the following components:   Glucose-Capillary 58 (*)    All other components within normal limits  CBG MONITORING, ED - Abnormal; Notable for the following components:   Glucose-Capillary 170 (*)     All other components within normal limits  CULTURE, BLOOD (SINGLE)  URINE CULTURE  CULTURE, BLOOD (ROUTINE X 2)  CULTURE, BLOOD (ROUTINE X 2)  LACTIC ACID, PLASMA  PROTIME-INR  URINALYSIS, ROUTINE W REFLEX MICROSCOPIC  LACTIC ACID, PLASMA  HEMOGLOBIN A1C  CBC  COMPREHENSIVE METABOLIC PANEL  CBG MONITORING, ED    EKG EKG Interpretation  Date/Time:  Friday Mar 31 2021 18:04:46 EDT Ventricular Rate:  80 PR Interval:  198 QRS Duration: 124 QT Interval:  388 QTC Calculation: 448 R Axis:   -30 Text Interpretation: Sinus rhythm Left ventricular hypertrophy No significant change since last tracing Confirmed by Fredia Sorrow [phone]) on 03/31/2021 6:40:59 PM   Radiology CT Abdomen Pelvis Wo Contrast  Result Date: 03/31/2021 CLINICAL DATA:  Abdominal pain and fever. Prostate cancer with seeds. Acute kidney injury. Rectal pain and fissure. EXAM: CT ABDOMEN AND PELVIS WITHOUT CONTRAST TECHNIQUE: Multidetector CT imaging of the abdomen and pelvis was performed following the standard protocol without IV contrast. COMPARISON:  Abdominopelvic CT 09/17/2012 FINDINGS: Lower chest: Bibasilar atelectasis. No pleural fluid. Coronary artery calcification or stents. Small hiatal hernia. Hepatobiliary: No focal hepatic abnormality on this noncontrast exam. Depending gallstones.  There may be a also minimal wall calcification of the gallbladder fundus. No pericholecystic fat stranding. No biliary dilatation. Pancreas: Mild fatty atrophy.  No ductal dilatation or inflammation. Spleen: Normal in size without focal abnormality. Adrenals/Urinary Tract: Normal adrenal glands. No hydronephrosis. No renal calculi. Trace bilateral perinephric edema, likely chronic. Decompressed ureters. No bladder wall thickening. Stomach/Bowel: Punctate metallic density in the stomach may be a clip. Small hiatal hernia. No small bowel obstruction, wall thickening, or inflammation. Appendix is not confidently visualized. No  appendicitis. Metallic density in the cecum may represent a surgical clip. Moderate volume of colonic stool. Sigmoid colon is redundant. There is sigmoid colonic diverticulosis without diverticulitis. Vascular/Lymphatic: Aortic atherosclerosis. No aortic aneurysm. Prominent periportal nodes are likely reactive. Scattered bilateral inguinal nodes also likely reactive. Reproductive: Brachytherapy seeds in the prostate. Large right hydrocele. Other: There is soft tissue gas involving the left and midline of the perineum, extending to the base of the penis. No minimal adjacent ill-defined fluid. Soft tissue stranding which extends to the left gluteal crease. There is no definitive connection to the anorectum. No intra-abdominopelvic fluid collection. Small fat containing umbilical hernia. Fat containing bilateral inguinal hernias. Skin thickening and soft tissue edema of the left anterior abdominal wall typical of medication injection sites. Musculoskeletal: Right hip arthroplasty. No focal bone lesion or acute osseous abnormality. Lumbar degenerative change. IMPRESSION: 1. Soft tissue gas involving the left and midline of the perineum, extending to the posterior base of the penis. Soft tissue stranding which extends to the left gluteal crease. There is no definitive connection to the anorectum. While findings may represent an ill-defined perirectal fistula, the possibility of necrotizing fasciitis is raised given the patchy soft tissue air. 2. Cholelithiasis without CT findings of acute cholecystitis. 3. Colonic diverticulosis without diverticulitis. 4. Small hiatal hernia. 5. Fat containing bilateral inguinal hernias. Aortic Atherosclerosis (ICD10-I70.0). These results were called by telephone at the time of interpretation on 03/31/2021 at 8:51 pm to provider Fredia Sorrow , who verbally acknowledged these results. Electronically Signed   By: Keith Rake M.D.   On: 03/31/2021 20:51   DG Chest Port 1  View  Result Date: 03/31/2021 CLINICAL DATA:  Weakness and fever. Recent COVID-19 positive. History of prostate carcinoma. EXAM: PORTABLE CHEST 1 VIEW COMPARISON:  October 20, 2020 chest radiograph; CT chest January 20, 2021 FINDINGS: There is mild scarring in the left base. Lungs elsewhere are clear. Heart size and pulmonary vascularity are normal. No adenopathy. No bone lesions. IMPRESSION: Mild scarring left base. No edema or airspace opacity. Heart size within normal limits. Electronically Signed   By: Lowella Grip III M.D.   On: 03/31/2021 15:23    Procedures Procedures CRITICAL CARE Performed by: Fredia Sorrow Total critical care time: 40 minutes Critical care time was exclusive of separately billable procedures and treating other patients. Critical care was necessary to treat or prevent imminent or life-threatening deterioration. Critical care was time spent personally by me on the following activities: development of treatment plan with patient and/or surrogate as well as nursing, discussions with consultants, evaluation of patient's response to treatment, examination of patient, obtaining history from patient or surrogate, ordering and performing treatments and interventions, ordering and review of laboratory studies, ordering and review of radiographic studies, pulse oximetry and re-evaluation of patient's condition.  Medications Ordered in ED Medications  dextrose 5 %-0.45 % sodium chloride infusion ( Intravenous New Bag/Given 03/31/21 2102)  clindamycin (CLEOCIN) IVPB 600 mg (has no administration in time range)  insulin aspart (novoLOG)  injection 0-9 Units (has no administration in time range)  vancomycin (VANCOREADY) IVPB 2000 mg/400 mL (has no administration in time range)  piperacillin-tazobactam (ZOSYN) IVPB 3.375 g (has no administration in time range)  acetaminophen (TYLENOL) tablet 650 mg (has no administration in time range)    Or  acetaminophen (TYLENOL) suppository 650  mg (has no administration in time range)  ondansetron (ZOFRAN) tablet 4 mg (has no administration in time range)    Or  ondansetron (ZOFRAN) injection 4 mg (has no administration in time range)  vancomycin (VANCOREADY) IVPB 1250 mg/250 mL (has no administration in time range)  atorvastatin (LIPITOR) tablet 20 mg (has no administration in time range)  gabapentin (NEURONTIN) capsule 600 mg (has no administration in time range)  febuxostat (ULORIC) tablet 40 mg (has no administration in time range)  ferrous sulfate tablet 325 mg (has no administration in time range)  amitriptyline (ELAVIL) tablet 50 mg (has no administration in time range)  buPROPion (WELLBUTRIN XL) 24 hr tablet 150 mg (has no administration in time range)  tetrahydrozoline 0.05 % ophthalmic solution 3 drop (has no administration in time range)  HYDROmorphone (DILAUDID) injection 1 mg (has no administration in time range)  sodium chloride 0.9 % bolus 1,000 mL (0 mLs Intravenous Stopped 03/31/21 2057)  ondansetron (ZOFRAN) injection 4 mg (4 mg Intravenous Given 03/31/21 1948)  HYDROmorphone (DILAUDID) injection 1 mg (1 mg Intravenous Given 03/31/21 1948)  dextrose 50 % solution 50 mL (50 mLs Intravenous Given 03/31/21 2145)  piperacillin-tazobactam (ZOSYN) IVPB 3.375 g (0 g Intravenous Stopped 03/31/21 2321)    ED Course  I have reviewed the triage vital signs and the nursing notes.  Pertinent labs & imaging results that were available during my care of the patient were reviewed by me and considered in my medical decision making (see chart for details).    MDM Rules/Calculators/A&P                          Patient with a leukocytosis white count 16,000.  Definitely fever.  Also acute kidney injury BUN/creatinine elevated and difficulty with hypoglycemia.  Patient started on D5 half-normal saline but blood sugar still were low got an amp of D50.  Blood sugars came up.  Chest x-ray negative.  COVID testing positive but patient had  tested positive for COVID outside our system 2 weeks ago.  CT scan raise concerns for infection perirectal area.  Possibly could be a fistula tract.  Does come to the base of the penis.  So could develop into Fournier's gangrene.  Possible necrotizing infection.  But patient currently not as toxic as you would expect unless things get worse.  Discussed with Dr. Ninfa Linden discussed with Dr. Lovena Neighbours from urology general surgery and urology will consult hospitalist will admit.  Patient received D50 for the low blood sugars and D5 half-normal saline.  Patient also started on Zosyn.  Blood cultures done.  Lactic acid not elevated. Final Clinical Impression(s) / ED Diagnoses Final diagnoses:  AKI (acute kidney injury) ([place])  Rectal pain  Hypoglycemia    Rx / DC Orders ED Discharge Orders    None       Fredia Sorrow, MD 03/31/21 [phone]

## 2021-03-31 NOTE — ED Provider Notes (Addendum)
Emergency Medicine Provider Triage Evaluation Note  [name] , a 75 y.o. male  was evaluated in triage.  Pt complains of rectal pain x 5 days, seen at urology office today. Also diarrhea. Cough x 1 week.  CBG 55 at the drs office, 221 recheck with EMS Temp 101, given Tylenol. Also given 2mg  Morphine.  Radiation for prostate CA 3 months ago. Fall 3 weeks ago, feels like this might have effected the pellets.  Had COVID 2 weeks ago.  Review of Systems  Positive: Fever, rectal pain, diarrhea, cough, SHOB Negative: Abdominal pain  Physical Exam  There were no vitals taken for this visit. Gen:   Awake, no distress   Resp:  Normal effort  MSK:   Moves extremities without difficulty  Other:  pale  Medical Decision Making  Medically screening exam initiated at 2:34 PM.  Appropriate orders placed.  Carolee Rota was informed that the remainder of the evaluation will be completed by another provider, this initial triage assessment does not replace that evaluation, and the importance of remaining in the ED until their evaluation is complete.     Tacy Learn, PA-C 03/31/21 1440    Tacy Learn, PA-C 03/31/21 1441    Valarie Merino, MD 04/01/21 [phone]

## 2021-03-31 NOTE — ED Triage Notes (Signed)
Patient was seen at his MD office earlier and was sent here. He presents with rectal pain and fissure. Patient reported to EMS that he spent the night on the floor. He was had weakness that has worsened over the past 3-4 weeks. While in the MD office, temp was 101.4 and CBG was 55. Patient was given Tylenol, 2 mg of Morphine and D10.  HX: Covid + 2 weeks ago, prostate cancer.       EMS vitals 221 CBG 98.8 101/80 BP 80 HR

## 2021-03-31 NOTE — ED Notes (Signed)
Pt given [place] sandwich and soda at this time per his request and also in light of his hypoglycemia. Will recheck CBG after he finishes eating. IV fluid with dextrose also started at this time

## 2021-04-01 ENCOUNTER — Inpatient Hospital Stay (HOSPITAL_COMMUNITY): Payer: No Typology Code available for payment source | Admitting: Certified Registered Nurse Anesthetist

## 2021-04-01 ENCOUNTER — Encounter (HOSPITAL_COMMUNITY)
Admission: EM | Disposition: A | Discharge status: Home Health | Payer: Self-pay | Source: Home / Self Care | Attending: Internal Medicine

## 2021-04-01 DIAGNOSIS — N493 Fournier gangrene: Secondary | ICD-10-CM | POA: Diagnosis not present

## 2021-04-01 HISTORY — PX: IRRIGATION AND DEBRIDEMENT ABSCESS: SHX5252

## 2021-04-01 LAB — GLUCOSE, CAPILLARY
Glucose-Capillary: 101 mg/dL — ABNORMAL HIGH (ref 70–99)
Glucose-Capillary: 127 mg/dL — ABNORMAL HIGH (ref 70–99)
Glucose-Capillary: 135 mg/dL — ABNORMAL HIGH (ref 70–99)
Glucose-Capillary: 174 mg/dL — ABNORMAL HIGH (ref 70–99)
Glucose-Capillary: 279 mg/dL — ABNORMAL HIGH (ref 70–99)
Glucose-Capillary: 294 mg/dL — ABNORMAL HIGH (ref 70–99)

## 2021-04-01 LAB — CBC
HCT: 26 % — ABNORMAL LOW (ref 39.0–52.0)
Hemoglobin: 8.3 g/dL — ABNORMAL LOW (ref 13.0–17.0)
MCH: 27.2 pg (ref 26.0–34.0)
MCHC: 31.9 g/dL (ref 30.0–36.0)
MCV: 85.2 fL (ref 80.0–100.0)
Platelets: 266 10*3/uL (ref 150–400)
RBC: 3.05 MIL/uL — ABNORMAL LOW (ref 4.22–5.81)
RDW: 13.2 % (ref 11.5–15.5)
WBC: 11.4 10*3/uL — ABNORMAL HIGH (ref 4.0–10.5)
nRBC: 0 % (ref 0.0–0.2)

## 2021-04-01 LAB — COMPREHENSIVE METABOLIC PANEL
ALT: 20 U/L (ref 0–44)
AST: 23 U/L (ref 15–41)
Albumin: 2.7 g/dL — ABNORMAL LOW (ref 3.5–5.0)
Alkaline Phosphatase: 104 U/L (ref 38–126)
Anion gap: 8 (ref 5–15)
BUN: 33 mg/dL — ABNORMAL HIGH (ref 8–23)
CO2: 22 mmol/L (ref 22–32)
Calcium: 8.3 mg/dL — ABNORMAL LOW (ref 8.9–10.3)
Chloride: 103 mmol/L (ref 98–111)
Creatinine, Ser: 2.04 mg/dL — ABNORMAL HIGH (ref 0.61–1.24)
GFR, Estimated: 33 mL/min — ABNORMAL LOW (ref >60)
Glucose, Bld: 133 mg/dL — ABNORMAL HIGH (ref 70–99)
Potassium: 4.1 mmol/L (ref 3.5–5.1)
Sodium: 133 mmol/L — ABNORMAL LOW (ref 135–145)
Total Bilirubin: 0.6 mg/dL (ref 0.3–1.2)
Total Protein: 6.6 g/dL (ref 6.5–8.1)

## 2021-04-01 LAB — URINALYSIS, ROUTINE W REFLEX MICROSCOPIC
Bacteria, UA: NONE SEEN
Bilirubin Urine: NEGATIVE
Glucose, UA: NEGATIVE mg/dL
Hgb urine dipstick: NEGATIVE
Ketones, ur: NEGATIVE mg/dL
Leukocytes,Ua: NEGATIVE
Nitrite: NEGATIVE
Protein, ur: NEGATIVE mg/dL
Specific Gravity, Urine: 1.014 (ref 1.005–1.030)
pH: 5 (ref 5.0–8.0)

## 2021-04-01 LAB — LACTIC ACID, PLASMA: Lactic Acid, Venous: 1.5 mmol/L (ref 0.5–1.9)

## 2021-04-01 LAB — HEMOGLOBIN A1C
Hgb A1c MFr Bld: 6.5 % — ABNORMAL HIGH (ref 4.8–5.6)
Mean Plasma Glucose: 139.85 mg/dL

## 2021-04-01 LAB — MRSA PCR SCREENING: MRSA by PCR: NEGATIVE

## 2021-04-01 SURGERY — IRRIGATION AND DEBRIDEMENT ABSCESS
Anesthesia: General

## 2021-04-01 MED ORDER — LIDOCAINE 2% (20 MG/ML) 5 ML SYRINGE
INTRAMUSCULAR | Status: DC | PRN
  Administered 2021-04-01: 100 mg via INTRAVENOUS

## 2021-04-01 MED ORDER — ACETAMINOPHEN 10 MG/ML IV SOLN
INTRAVENOUS | Status: AC
  Filled 2021-04-01: qty 100

## 2021-04-01 MED ORDER — FENTANYL CITRATE (PF) 100 MCG/2ML IJ SOLN
INTRAMUSCULAR | Status: AC
  Filled 2021-04-01: qty 2

## 2021-04-01 MED ORDER — SUCCINYLCHOLINE CHLORIDE 200 MG/10ML IV SOSY
PREFILLED_SYRINGE | INTRAVENOUS | Status: AC
  Filled 2021-04-01: qty 10

## 2021-04-01 MED ORDER — SODIUM CHLORIDE 0.9 % IV SOLN: INTRAVENOUS | Status: AC

## 2021-04-01 MED ORDER — FENTANYL CITRATE (PF) 100 MCG/2ML IJ SOLN: 25.0000 ug | INTRAMUSCULAR | Status: DC | PRN

## 2021-04-01 MED ORDER — LACTATED RINGERS IV SOLN: INTRAVENOUS | Status: DC | PRN

## 2021-04-01 MED ORDER — ACETAMINOPHEN 10 MG/ML IV SOLN
INTRAVENOUS | Status: DC | PRN
  Administered 2021-04-01: 1000 mg via INTRAVENOUS

## 2021-04-01 MED ORDER — SODIUM CHLORIDE 0.9% FLUSH
10.0000 mL | Freq: Two times a day (BID) | INTRAVENOUS | Status: DC
  Administered 2021-04-02 – 2021-04-03 (×2): 10 mL
  Administered 2021-04-03: 20 mL
  Administered 2021-04-04 – 2021-04-10 (×4): 10 mL

## 2021-04-01 MED ORDER — FENTANYL CITRATE (PF) 100 MCG/2ML IJ SOLN
INTRAMUSCULAR | Status: DC | PRN
  Administered 2021-04-01 (×2): 50 ug via INTRAVENOUS
  Administered 2021-04-01: 100 ug via INTRAVENOUS

## 2021-04-01 MED ORDER — PROPOFOL 10 MG/ML IV BOLUS
INTRAVENOUS | Status: DC | PRN
  Administered 2021-04-01: 150 mg via INTRAVENOUS

## 2021-04-01 MED ORDER — ONDANSETRON HCL 4 MG/2ML IJ SOLN
INTRAMUSCULAR | Status: DC | PRN
  Administered 2021-04-01: 4 mg via INTRAVENOUS

## 2021-04-01 MED ORDER — HYDROCODONE-ACETAMINOPHEN 5-325 MG PO TABS
1.0000 | ORAL_TABLET | ORAL | Status: DC | PRN
  Administered 2021-04-01 – 2021-04-02 (×2): 2 via ORAL
  Administered 2021-04-02: 1 via ORAL
  Administered 2021-04-02 – 2021-04-06 (×16): 2 via ORAL
  Filled 2021-04-01: qty 2
  Filled 2021-04-01: qty 1
  Filled 2021-04-01 (×19): qty 2

## 2021-04-01 MED ORDER — PROPOFOL 10 MG/ML IV BOLUS
INTRAVENOUS | Status: AC
  Filled 2021-04-01: qty 20

## 2021-04-01 MED ORDER — SODIUM CHLORIDE 0.9% FLUSH: 10.0000 mL | INTRAVENOUS | Status: DC | PRN

## 2021-04-01 MED ORDER — SODIUM CHLORIDE 0.9 % IR SOLN
Status: DC | PRN
  Administered 2021-04-01: 1000 mL

## 2021-04-01 MED ORDER — CHLORHEXIDINE GLUCONATE CLOTH 2 % EX PADS
6.0000 | MEDICATED_PAD | Freq: Every day | CUTANEOUS | Status: DC
  Administered 2021-04-01 – 2021-04-11 (×10): 6 via TOPICAL

## 2021-04-01 MED ORDER — DEXAMETHASONE SODIUM PHOSPHATE 10 MG/ML IJ SOLN
INTRAMUSCULAR | Status: DC | PRN
  Administered 2021-04-01: 5 mg via INTRAVENOUS

## 2021-04-01 MED ORDER — HYDROMORPHONE HCL 1 MG/ML IJ SOLN
0.5000 mg | INTRAMUSCULAR | Status: DC | PRN
  Administered 2021-04-03 – 2021-04-07 (×6): 1 mg via INTRAVENOUS
  Filled 2021-04-01 (×6): qty 1

## 2021-04-01 MED ORDER — CARVEDILOL 3.125 MG PO TABS
3.1250 mg | ORAL_TABLET | Freq: Two times a day (BID) | ORAL | Status: DC
  Administered 2021-04-01 – 2021-04-09 (×14): 3.125 mg via ORAL
  Filled 2021-04-01 (×15): qty 1

## 2021-04-01 MED ORDER — PHENYLEPHRINE 40 MCG/ML (10ML) SYRINGE FOR IV PUSH (FOR BLOOD PRESSURE SUPPORT)
PREFILLED_SYRINGE | INTRAVENOUS | Status: AC
  Filled 2021-04-01: qty 10

## 2021-04-01 MED ORDER — LIDOCAINE 2% (20 MG/ML) 5 ML SYRINGE
INTRAMUSCULAR | Status: AC
  Filled 2021-04-01: qty 5

## 2021-04-01 MED ORDER — ORAL CARE MOUTH RINSE
15.0000 mL | Freq: Two times a day (BID) | OROMUCOSAL | Status: DC
  Administered 2021-04-01 – 2021-04-11 (×18): 15 mL via OROMUCOSAL

## 2021-04-01 MED ORDER — DEXAMETHASONE SODIUM PHOSPHATE 10 MG/ML IJ SOLN
INTRAMUSCULAR | Status: AC
  Filled 2021-04-01: qty 1

## 2021-04-01 MED ORDER — PHENYLEPHRINE 40 MCG/ML (10ML) SYRINGE FOR IV PUSH (FOR BLOOD PRESSURE SUPPORT)
PREFILLED_SYRINGE | INTRAVENOUS | Status: DC | PRN
  Administered 2021-04-01: 200 ug via INTRAVENOUS

## 2021-04-01 MED ORDER — ONDANSETRON HCL 4 MG/2ML IJ SOLN
INTRAMUSCULAR | Status: AC
  Filled 2021-04-01: qty 2

## 2021-04-01 MED ORDER — ONDANSETRON HCL 4 MG/2ML IJ SOLN: 4.0000 mg | Freq: Once | INTRAMUSCULAR | Status: DC | PRN

## 2021-04-01 MED ORDER — PHENYLEPHRINE HCL-NACL 10-0.9 MG/250ML-% IV SOLN
INTRAVENOUS | Status: AC
  Filled 2021-04-01: qty 250

## 2021-04-01 SURGICAL SUPPLY — 34 items
BENZOIN TINCTURE PRP APPL 2/3 (GAUZE/BANDAGES/DRESSINGS) IMPLANT
BLADE HEX COATED 2.75 (ELECTRODE) ×2 IMPLANT
BLADE SURG SZ10 CARB STEEL (BLADE) ×4 IMPLANT
COVER WAND RF STERILE (DRAPES) IMPLANT
DECANTER SPIKE VIAL GLASS SM (MISCELLANEOUS) IMPLANT
DRAPE LAPAROTOMY T 102X78X121 (DRAPES) IMPLANT
DRAPE LAPAROTOMY TRNSV 102X78 (DRAPES) IMPLANT
DRAPE SHEET LG 3/4 BI-LAMINATE (DRAPES) IMPLANT
DRSG PAD ABDOMINAL 8X10 ST (GAUZE/BANDAGES/DRESSINGS) ×2 IMPLANT
ELECT REM PT RETURN 15FT ADLT (MISCELLANEOUS) ×2 IMPLANT
EVACUATOR SILICONE 100CC (DRAIN) IMPLANT
GAUZE SPONGE 4X4 12PLY STRL (GAUZE/BANDAGES/DRESSINGS) ×2 IMPLANT
GLOVE SURG ENC MOIS LTX SZ7.5 (GLOVE) ×4 IMPLANT
GLOVE SURG UNDER POLY LF SZ7 (GLOVE) ×2 IMPLANT
GOWN STRL REUS W/ TWL XL LVL3 (GOWN DISPOSABLE) ×1 IMPLANT
GOWN STRL REUS W/TWL LRG LVL3 (GOWN DISPOSABLE) ×2 IMPLANT
GOWN STRL REUS W/TWL XL LVL3 (GOWN DISPOSABLE) ×4 IMPLANT
KIT BASIN OR (CUSTOM PROCEDURE TRAY) ×2 IMPLANT
KIT TURNOVER KIT A (KITS) ×2 IMPLANT
LOOP VESSEL MAXI BLUE (MISCELLANEOUS) ×2 IMPLANT
MARKER SKIN DUAL TIP RULER LAB (MISCELLANEOUS) IMPLANT
NEEDLE HYPO 25X1 1.5 SAFETY (NEEDLE) ×2 IMPLANT
NS IRRIG 1000ML POUR BTL (IV SOLUTION) ×2 IMPLANT
PACK BASIC VI WITH GOWN DISP (CUSTOM PROCEDURE TRAY) ×2 IMPLANT
PENCIL SMOKE EVACUATOR (MISCELLANEOUS) IMPLANT
SOL PREP POV-IOD 4OZ 10% (MISCELLANEOUS) ×2 IMPLANT
SPONGE LAP 18X18 RF (DISPOSABLE) IMPLANT
SPONGE LAP 4X18 RFD (DISPOSABLE) ×2 IMPLANT
STAPLER VISISTAT 35W (STAPLE) IMPLANT
SUT MNCRL AB 4-0 PS2 18 (SUTURE) IMPLANT
SUT VIC AB 3-0 SH 18 (SUTURE) IMPLANT
SYR CONTROL 10ML LL (SYRINGE) ×2 IMPLANT
TOWEL OR 17X26 10 PK STRL BLUE (TOWEL DISPOSABLE) ×2 IMPLANT
WATER STERILE IRR 1000ML POUR (IV SOLUTION) ×2 IMPLANT

## 2021-04-01 NOTE — Progress Notes (Signed)
PROGRESS NOTE    [name]  KDT:[id number] DOB: 1946-11-09 DOA: 03/31/2021 PCP: [facility]    No chief complaint on file.   Brief Narrative:  History of hypertension, diabetes, CVA in 2015,CKD 3A, anemia of chronic disease, gout, OSA, prostate cancer status post seeds placed in December, previous perirectal abscess without fistula, with c/o 5 day h/o rectal pain, associated scrotal pain and swelling , diarrhea ,was at urologist office who noted fever 101.4, sent to ED  History of COVID 2 weeks ago, reports symptom has improved/resolved, test again positive for COVID in the ED this time  WBC 16.1k.  Creat 2.5 up from 1.6 baseline, blood glucose 55  CT A/P: soft tissue gas involving L and midline of perineum extending to posterior base of penis.  No definate connection to anorectum.  Concern for necrotizing fasciitis.  Lactate nl, no hypotension  EDP discussed with urology Dr. Lovena Neighbours and general surgery Dr. Ninfa Linden   Subjective:  Seen after returned from OR, some surgical wound pain, , tmax 100.6, blood pressure stable, he is on room air Wife at bedside   Assessment & Plan:   Principal Problem:   Fournier's gangrene Active Problems:   DM (diabetes mellitus) ([place])   CKD (chronic kidney disease)   HTN (hypertension)   Malignant neoplasm of prostate ([place])   Cellulitis of perineum   AKI (acute kidney injury) ([place])   Necrotizing fasciitis/Fournier gangrene/sepsis present on admission -With fever, leukocytosis, tachypnea, AKI -Blood culture obtained in the ED, MRSA screening in the process , surgical wound culture in process -He started on Vanco/Zosyn/clindamycin on admission -He is n.p.o., s/p  I&D , will follow general surgery and urology recommendation  Insulin-dependent type 2 diabetes, present with hypoglycemia,  H/o peripheral neuropathy  Blood glucose 55, initially received a D10 in ED, currently on D5 half-normal saline infusion A1c in  process Holding Lantus  AKI on CKD 3, anemia of chronic disease Creatinine 2.58 on presentation, baseline creatinine 1.6 Hold lisinopril Renal dosing medication Repeat BMP in the morning  hypertension On Coreg 12.5, Norvasc, lisinopril Continue Coreg with lower dose with holding parameters, hold Norvasc and lisinopril   Prostate cancer status post seeds , management per urology  COVID-19 infection He was tested +2 weeks ago, reports symptom has improved/ resolved, repeat testing on admission remain positive Chest x-ray no acute findings No hypoxia Fever/tachypnea could be explained by Fournier gangrene    Body mass index is 32.87 kg/m.Marland Kitchen        DVT prophylaxis: SCDs Start: 03/31/21 2240   Code Status:full Family Communication: wife Disposition:   Status is: Inpatient  Dispo: The patient is from: home              Anticipated d/c is to: home              Anticipated d/c date is: likely early next week                Consultants:   gen surg  urology  Procedures:    on 5/14: [place], PLACEMENT OF SETON  Antimicrobials:   Anti-infectives (From admission, onward)   Start     Dose/Rate Route Frequency Ordered Stop   04/02/21 2300  vancomycin (VANCOREADY) IVPB 1250 mg/250 mL        1,250 mg 166.7 mL/hr over 90 Minutes Intravenous Every 48 hours 03/31/21 2244     04/01/21 0800  piperacillin-tazobactam (ZOSYN) IVPB 3.375 g  3.375 g 12.5 mL/hr over 240 Minutes Intravenous Every 8 hours 03/31/21 2243     03/31/21 2245  clindamycin (CLEOCIN) IVPB 600 mg        600 mg 100 mL/hr over 30 Minutes Intravenous Every 8 hours 03/31/21 2231     03/31/21 2245  vancomycin (VANCOREADY) IVPB 2000 mg/400 mL        2,000 mg 200 mL/hr over 120 Minutes Intravenous STAT 03/31/21 2236 04/01/21 0747   03/31/21 2115  piperacillin-tazobactam (ZOSYN) IVPB 3.375 g        3.375 g 100 mL/hr over 30 Minutes Intravenous  Once  03/31/21 2113 03/31/21 2321   03/31/21 2100  piperacillin-tazobactam (ZOSYN) IVPB 3.375 g  Status:  Discontinued        3.375 g 100 mL/hr over 30 Minutes Intravenous  Once 03/31/21 2056 03/31/21 2058   03/31/21 2100  piperacillin-tazobactam (ZOSYN) IVPB 4.5 g  Status:  Discontinued        4.5 g 200 mL/hr over 30 Minutes Intravenous  Once 03/31/21 2058 03/31/21 2112          Objective: Vitals:   04/01/21 1030 04/01/21 1035 04/01/21 1040 04/01/21 1200  BP: (!) 99/55 (!) 101/59  (!) 129/44  Pulse: 79 81 85 75  Resp: (!) 22 17 16 18   Temp:    98.4 F (36.9 C)  TempSrc:    Oral  SpO2: 95% 96% 96% 94%  Weight:      Height:        Intake/Output Summary (Last 24 hours) at 04/01/2021 1250 Last data filed at 04/01/2021 0951 Gross per 24 hour  Intake --  Output 790 ml  Net -790 ml   Filed Weights   03/31/21 1521 04/01/21 0500  Weight: 100.7 kg 103.9 kg    Examination:  General exam: calm, NAD Respiratory system: Clear to auscultation. Respiratory effort normal. Cardiovascular system: S1 & S2 heard, RRR. No JVD, no murmur, No pedal edema. Gastrointestinal system: Abdomen is nondistended, soft and nontender.  Normal bowel sounds heard. Central nervous system: Alert and oriented. No focal neurological deficits. Extremities: Symmetric 5 x 5 power. Skin: No rashes, lesions or ulcers Psychiatry: Judgement and insight appear normal. Mood & affect appropriate.     Data Reviewed: I have personally reviewed following labs and imaging studies  CBC: Recent Labs  Lab 03/31/21 1800 04/01/21 0706  WBC 16.1* 11.4*  NEUTROABS 12.7*  --   HGB 9.8* 8.3*  HCT 30.6* 26.0*  MCV 86.7 85.2  PLT 242 850    Basic Metabolic Panel: Recent Labs  Lab 03/31/21 1800 04/01/21 0706  NA 136 133*  K 4.7 4.1  CL 102 103  CO2 23 22  GLUCOSE 77 133*  BUN 33* 33*  CREATININE 2.58* 2.04*  CALCIUM 9.1 8.3*    GFR: Estimated Creatinine Clearance: 37.8 mL/min (A) (by C-G formula based on  SCr of 2.04 mg/dL (H)).  Liver Function Tests: Recent Labs  Lab 03/31/21 1800 04/01/21 0706  AST 33 23  ALT 26 20  ALKPHOS 134* 104  BILITOT 0.5 0.6  PROT 8.4* 6.6  ALBUMIN 3.7 2.7*    CBG: Recent Labs  Lab 03/31/21 2054 03/31/21 2319 04/01/21 0740 04/01/21 1015 04/01/21 1154  GLUCAP 58* 170* 135* 101* 127*     Recent Results (from the past 240 hour(s))  Resp Panel by RT-PCR (Flu A&B, Covid) Nasopharyngeal Swab     Status: Abnormal   Collection Time: 03/31/21  7:52 PM   Specimen: Nasopharyngeal Swab; Nasopharyngeal(NP)  swabs in vial transport medium  Result Value Ref Range Status   SARS Coronavirus 2 by RT PCR POSITIVE (A) NEGATIVE Final    Comment: RESULT CALLED TO, READ BACK BY AND VERIFIED WITH: BANNO,A 03/31/21 @2147  BY SEELMOL (NOTE) SARS-CoV-2 target nucleic acids are DETECTED.  The SARS-CoV-2 RNA is generally detectable in upper respiratory specimens during the acute phase of infection. Positive results are indicative of the presence of the identified virus, but do not rule out bacterial infection or co-infection with other pathogens not detected by the test. Clinical correlation with patient history and other diagnostic information is necessary to determine patient infection status. The expected result is Negative.  Fact Sheet for Patients: EntrepreneurPulse.com.au  Fact Sheet for Healthcare Providers: IncredibleEmployment.be  This test is not yet approved or cleared by the [place] FDA and  has been authorized for detection and/or diagnosis of SARS-CoV-2 by FDA under an Emergency Use Authorization (EUA).  This EUA will remain in effect (meaning this test can be  used) for the duration of  the COVID-19 declaration under Section 564(b)(1) of the Act, 21 U.S.C. section 360bbb-3(b)(1), unless the authorization is terminated or revoked sooner.     Influenza A by PCR NEGATIVE NEGATIVE Final   Influenza B by PCR  NEGATIVE NEGATIVE Final    Comment: (NOTE) The Xpert Xpress SARS-CoV-2/FLU/RSV plus assay is intended as an aid in the diagnosis of influenza from Nasopharyngeal swab specimens and should not be used as a sole basis for treatment. Nasal washings and aspirates are unacceptable for Xpert Xpress SARS-CoV-2/FLU/RSV testing.  Fact Sheet for Patients: EntrepreneurPulse.com.au  Fact Sheet for Healthcare Providers: IncredibleEmployment.be  This test is not yet approved or cleared by the [place] FDA and has been authorized for detection and/or diagnosis of SARS-CoV-2 by FDA under an Emergency Use Authorization (EUA). This EUA will remain in effect (meaning this test can be used) for the duration of the COVID-19 declaration under Section 564(b)(1) of the Act, 21 U.S.C. section 360bbb-3(b)(1), unless the authorization is terminated or revoked.  Performed at [facility], [place] [address], [place], [place] [address]   MRSA PCR Screening     Status: None   Collection Time: 04/01/21  3:45 AM   Specimen: Nasal Mucosa; Nasopharyngeal  Result Value Ref Range Status   MRSA by PCR NEGATIVE NEGATIVE Final    Comment:        The GeneXpert MRSA Assay (FDA approved for NASAL specimens only), is one component of a comprehensive MRSA colonization surveillance program. It is not intended to diagnose MRSA infection nor to guide or monitor treatment for MRSA infections. Performed at [facility], [place] [address] [place],  [address]          Radiology Studies: CT Abdomen Pelvis Wo Contrast  Result Date: 03/31/2021 CLINICAL DATA:  Abdominal pain and fever. Prostate cancer with seeds. Acute kidney injury. Rectal pain and fissure. EXAM: CT ABDOMEN AND PELVIS WITHOUT CONTRAST TECHNIQUE: Multidetector CT imaging of the abdomen and pelvis was performed following the standard protocol without IV contrast. COMPARISON:   Abdominopelvic CT 09/17/2012 FINDINGS: Lower chest: Bibasilar atelectasis. No pleural fluid. Coronary artery calcification or stents. Small hiatal hernia. Hepatobiliary: No focal hepatic abnormality on this noncontrast exam. Depending gallstones. There may be a also minimal wall calcification of the gallbladder fundus. No pericholecystic fat stranding. No biliary dilatation. Pancreas: Mild fatty atrophy.  No ductal dilatation or inflammation. Spleen: Normal in size without focal abnormality. Adrenals/Urinary Tract: Normal adrenal glands.  No hydronephrosis. No renal calculi. Trace bilateral perinephric edema, likely chronic. Decompressed ureters. No bladder wall thickening. Stomach/Bowel: Punctate metallic density in the stomach may be a clip. Small hiatal hernia. No small bowel obstruction, wall thickening, or inflammation. Appendix is not confidently visualized. No appendicitis. Metallic density in the cecum may represent a surgical clip. Moderate volume of colonic stool. Sigmoid colon is redundant. There is sigmoid colonic diverticulosis without diverticulitis. Vascular/Lymphatic: Aortic atherosclerosis. No aortic aneurysm. Prominent periportal nodes are likely reactive. Scattered bilateral inguinal nodes also likely reactive. Reproductive: Brachytherapy seeds in the prostate. Large right hydrocele. Other: There is soft tissue gas involving the left and midline of the perineum, extending to the base of the penis. No minimal adjacent ill-defined fluid. Soft tissue stranding which extends to the left gluteal crease. There is no definitive connection to the anorectum. No intra-abdominopelvic fluid collection. Small fat containing umbilical hernia. Fat containing bilateral inguinal hernias. Skin thickening and soft tissue edema of the left anterior abdominal wall typical of medication injection sites. Musculoskeletal: Right hip arthroplasty. No focal bone lesion or acute osseous abnormality. Lumbar degenerative  change. IMPRESSION: 1. Soft tissue gas involving the left and midline of the perineum, extending to the posterior base of the penis. Soft tissue stranding which extends to the left gluteal crease. There is no definitive connection to the anorectum. While findings may represent an ill-defined perirectal fistula, the possibility of necrotizing fasciitis is raised given the patchy soft tissue air. 2. Cholelithiasis without CT findings of acute cholecystitis. 3. Colonic diverticulosis without diverticulitis. 4. Small hiatal hernia. 5. Fat containing bilateral inguinal hernias. Aortic Atherosclerosis (ICD10-I70.0). These results were called by telephone at the time of interpretation on 03/31/2021 at 8:51 pm to provider Fredia Sorrow , who verbally acknowledged these results. Electronically Signed   By: Keith Rake M.D.   On: 03/31/2021 20:51   DG Chest Port 1 View  Result Date: 03/31/2021 CLINICAL DATA:  Weakness and fever. Recent COVID-19 positive. History of prostate carcinoma. EXAM: PORTABLE CHEST 1 VIEW COMPARISON:  October 20, 2020 chest radiograph; CT chest January 20, 2021 FINDINGS: There is mild scarring in the left base. Lungs elsewhere are clear. Heart size and pulmonary vascularity are normal. No adenopathy. No bone lesions. IMPRESSION: Mild scarring left base. No edema or airspace opacity. Heart size within normal limits. Electronically Signed   By: Lowella Grip III M.D.   On: 03/31/2021 15:23        Scheduled Meds: . amitriptyline  50 mg Oral QHS  . atorvastatin  20 mg Oral QHS  . buPROPion  150 mg Oral Daily  . carvedilol  3.125 mg Oral BID WC  . Chlorhexidine Gluconate Cloth  6 each Topical Daily  . febuxostat  40 mg Oral QHS  . ferrous sulfate  325 mg Oral Q breakfast  . gabapentin  600 mg Oral QHS  . insulin aspart  0-9 Units Subcutaneous Q4H  . mouth rinse  15 mL Mouth Rinse BID  . tetrahydrozoline  3 drop Both Eyes Daily   Continuous Infusions: . sodium chloride    .  clindamycin (CLEOCIN) IV Stopped (04/01/21 0737)  . phenylephrine    . piperacillin-tazobactam (ZOSYN)  IV Stopped (04/01/21 1137)  . [START ON 04/02/2021] vancomycin       LOS: 1 day   Time spent: 24mins Greater than 50% of this time was spent in counseling, explanation of diagnosis, planning of further management, and coordination of care.   Voice Recognition Viviann Spare dictation system was used  to create this note, attempts have been made to correct errors. Please contact the author with questions and/or clarifications.   Florencia Reasons, MD PhD FACP [facility]  Available via Epic secure chat 7am-7pm for nonurgent issues Please page for urgent issues To page the attending provider between 7A-7P or the covering provider during after hours 7P-7A, please log into the web site [email or web address] and access using universal  password for that web site. If you do not have the password, please call the hospital operator.    04/01/2021, 12:50 PM

## 2021-04-01 NOTE — Transfer of Care (Signed)
Immediate Anesthesia Transfer of Care Note  Patient: [name]  Procedure(s) Performed: EXAM  UNDER ANESTHESIA; ANOSCOPY; DRAINAGE OF PERIRECTAL ABSCESS; PLACEMENT OF SETON (N/A )  Patient Location: PACU  Anesthesia Type:General  Level of Consciousness: awake, alert  and oriented  Airway & Oxygen Therapy: Patient Spontanous Breathing and Patient connected to face mask  Post-op Assessment: Report given to RN and Post -op Vital signs reviewed and stable  Post vital signs: Reviewed and stable  Last Vitals:  Vitals Value Taken Time  BP 116/63 04/01/21 1009  Temp    Pulse 85 04/01/21 1012  Resp 16 04/01/21 1012  SpO2 100 % 04/01/21 1012  Vitals shown include unvalidated device data.  Last Pain:  Vitals:   04/01/21 0800  TempSrc: Oral  PainSc:       Patients Stated Pain Goal: 3 (16/94/50 3888)  Complications: No complications documented.

## 2021-04-01 NOTE — Anesthesia Postprocedure Evaluation (Signed)
Anesthesia Post Note  Patient: [name]  Procedure(s) Performed: EXAM  UNDER ANESTHESIA; ANOSCOPY; DRAINAGE OF PERIRECTAL ABSCESS; PLACEMENT OF SETON (N/A )     Patient location during evaluation: PACU Anesthesia Type: General Level of consciousness: awake Pain management: pain level controlled Vital Signs Assessment: post-procedure vital signs reviewed and stable Respiratory status: spontaneous breathing Cardiovascular status: stable Postop Assessment: no apparent nausea or vomiting Anesthetic complications: no   No complications documented.  Last Vitals:  Vitals:   04/01/21 1015 04/01/21 1019  BP: 106/72   Pulse: 87 84  Resp: 16 16  Temp:    SpO2: 100% 100%    Last Pain:  Vitals:   04/01/21 0800  TempSrc: Oral  PainSc:                  Huston Foley

## 2021-04-01 NOTE — Op Note (Addendum)
03/31/2021 - 04/01/2021  9:54 AM  PATIENT:  [name]  75 y.o. male  PRE-OPERATIVE DIAGNOSIS:  PERINEAL ABSCESS   POST-OPERATIVE DIAGNOSIS:  PERINEAL ABSCESS, ANAL FISTULA  PROCEDURE:  Procedure(s): EXAM UNDER ANESTHESIA, INCISION AND DRAINAGE PERIRECTAL ABSCESS WITH ANAL FISTULA, PLACEMENT OF SETON  SURGEON:  Surgeon(s) and Role:    * Jovita Kussmaul, MD - Primary  PHYSICIAN ASSISTANT:   ASSISTANTS: none   ANESTHESIA:   general  EBL:  20cc   BLOOD ADMINISTERED:none  DRAINS: none   LOCAL MEDICATIONS USED:  NONE  SPECIMEN:  No Specimen  DISPOSITION OF SPECIMEN:  N/A  COUNTS:  YES  TOURNIQUET:  * No tourniquets in log *  DICTATION: .Dragon Dictation   After informed consent was obtained the patient was brought to the operating room and placed in the supine position on the operating table.  After adequate induction of general anesthesia the patient was moved in the lithotomy position and all pressure points were padded.  The perirectal and perineal spaces were then prepped with Betadine and draped in usual sterile manner.  An appropriate timeout was performed.  An anoscope was then inserted into the rectum and the rectum was examined circumferentially.  There was a nickel sized ulceration anteriorly at the level of the prostate that was draining pus.  There was some mild cellulitis just to the left of the rectum.  A small incision was made at this site with a 10 blade knife.  The incision was carried through the skin and subcutaneous tissue sharply with the electrocautery.  The area was then probed with a hemostat until we were able to identify the abscess space which was to the left of the rectum and into the anterior space in front of the rectum.  A large amount of pus was evacuated.  Cultures were obtained.  The cavity was probed with a hemostat and we readily identified the connection anteriorly to the ulceration.  A blue vessel loop was placed across this fistula tract as a  seton.  The abscess cavity was then packed with Kerlix gauze after hemostasis was achieved using the Bovie electrocautery.  The patient tolerated the procedure well.  At the end of the case all needle sponge and instrument counts were correct.  The patient was then awakened and taken to recovery in stable condition.  PLAN OF CARE: Admit to inpatient   PATIENT DISPOSITION:  PACU - hemodynamically stable.   Delay start of Pharmacological VTE agent (>24hrs) due to surgical blood loss or risk of bleeding: no

## 2021-04-01 NOTE — Anesthesia Preprocedure Evaluation (Signed)
Anesthesia Evaluation  Patient identified by MRN, date of birth, ID band Patient awake    Reviewed: Allergy & Precautions, NPO status , Patient's Chart, lab work & pertinent test results, reviewed documented beta blocker date and time   Airway Mallampati: III       Dental  (+) Teeth Intact, Poor Dentition, Caps   Pulmonary sleep apnea and Continuous Positive Airway Pressure Ventilation ,    Pulmonary exam normal        Cardiovascular hypertension, Pt. on medications and Pt. on home beta blockers Normal cardiovascular exam     Neuro/Psych PSYCHIATRIC DISORDERS Anxiety Depression  Neuromuscular disease CVA, No Residual Symptoms    GI/Hepatic   Endo/Other  diabetes, Well Controlled, Type 2, Insulin Dependent  Renal/GU Renal InsufficiencyRenal diseaseCKD II  negative genitourinary   Musculoskeletal   Abdominal (+) + obese,   Peds  Hematology  (+) Blood dyscrasia, anemia ,   Anesthesia Other Findings   Reproductive/Obstetrics                             Anesthesia Physical Anesthesia Plan  ASA: III  Anesthesia Plan: General   Post-op Pain Management:    Induction: Intravenous  PONV Risk Score and Plan: Treatment may vary due to age or medical condition and Ondansetron  Airway Management Planned:   Additional Equipment: None  Intra-op Plan:   Post-operative Plan: Extubation in OR  Informed Consent: I have reviewed the patients History and Physical, chart, labs and discussed the procedure including the risks, benefits and alternatives for the proposed anesthesia with the patient or authorized representative who has indicated his/her understanding and acceptance.     Dental advisory given  Plan Discussed with: CRNA  Anesthesia Plan Comments:         Anesthesia Quick Evaluation

## 2021-04-01 NOTE — Consult Note (Signed)
Reason for Consult:rectal pain Referring Physician: Dr. Phineas Glover is an 75 y.o. male.  HPI:  The patient is a 75 year old white male who presents with rectal pain for the last couple days. [name] says [name] has had this before but never quite as severe. [name] has had some diarrhea. No fever. No drainage. [name] did have prostate seeds placed about 6 months ago  Past Medical History:  Diagnosis Date  . Abrasion of face    11-15-2020  per pt stated has small area under one ear,  no drainage , no dressing, putting neosporin on it  . Anemia associated with chronic renal failure   . Anxiety   . Arthritis   . Chronic constipation   . Chronic gout   . Chronic gout    per pt last episode approx. 2005  (followed by [place]cp @[facility] )  . Chronic pruritic rash in adult    intermittant on back  . CKD (chronic kidney disease), stage III ([place])    followed by nephrologist @ [place]  . Diverticulosis of colon   . DOE (dyspnea on exertion)    long distance walking and up flight of stairs but states ok after rest couple minutes  . Dyspnea   . History of agent Orange exposure   . History of basal cell carcinoma excision    back, stomach, face  . 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 History of DVT of lower extremity   . History of kidney stones   . Hyperplasia of prostate with lower urinary tract symptoms (LUTS)   . Hypertension    followed by [place]cp [facility]   . OSA on CPAP    severe per study in epic 08-31-2015,  states uses nightly  . Peripheral neuropathy   . Prostate cancer [facility]) urologist--- dr Tresa Moore;  oncologist--- dr Tammi Klippel   first dx 02/ 2014 active survillance;  repeat bx 08-29-2020 ,  Stage T1c, Gleason 3+4  . Type 2 diabetes mellitus treated with insulin ([place])    followed by pcp @ [place]   (11-15-2020  per pt check blood sugar twice daily,  fasting sugar --- 110--120)  .  Wears glasses   . Wears hearing aid in both ears    HOH even when [name] wears aids    Past Surgical History:  Procedure Laterality Date  . APPENDECTOMY  age 19  . BLEPHAROPLASTY Bilateral 2015   upper eyelid  . CYSTOSCOPY N/A 11/16/2020   Procedure: CYSTOSCOPY FLEXIBLE;  Surgeon: Alexis Frock, MD;  Location: [facility];  Service: Urology;  Laterality: N/A;  No seeds detected in bladder per Dr. Tresa Moore  . CYSTOSCOPY/RETROGRADE/URETEROSCOPY/STONE EXTRACTION WITH BASKET  2002  . MANDIBLE RECONSTRUCTION Left 2014   w/ cadaver bone  . MOHS SURGERY  2012   nose  . PILONIDAL CYST EXCISION  05/2020  . RADIOACTIVE SEED IMPLANT N/A 11/16/2020   Procedure: RADIOACTIVE SEED IMPLANT/BRACHYTHERAPY IMPLANT;  Surgeon: Alexis Frock, MD;  Location: [facility];  Service: Urology;  Laterality: N/A;  90 MINS  . SPACE OAR INSTILLATION N/A 11/16/2020   Procedure: SPACE OAR INSTILLATION;  Surgeon: Alexis Frock, MD;  Location: [facility];  Service: Urology;  Laterality: N/A;  . TOTAL HIP ARTHROPLASTY Right 10-26-2003  @[facility]   . TOTAL KNEE ARTHROPLASTY Right 01/22/2017   Procedure: RIGHT TOTAL KNEE ARTHROPLASTY;  Surgeon: Paralee Cancel, MD;  Location: [facility];  Service: Orthopedics;  Laterality: Right;    Family History  Problem Relation Age of Onset  . Dementia Mother   . Hypertension Mother   . Diabetes Mother   . Heart attack Father 2  . Kidney cancer Brother        tumor removed  . Hypertension Brother   . Liver cancer Maternal Grandfather   . Testicular cancer Brother   . Cancer Paternal Aunt   . Colon cancer Neg Hx   . Colon polyps Neg Hx   . Esophageal cancer Neg Hx   . Stomach cancer Neg Hx   . Rectal cancer Neg Hx     Social History:  reports that [name] has never smoked. [name] has never used smokeless tobacco. [name] reports that [name] does not drink alcohol and does not use drugs.  Allergies: No Known Allergies  Medications: I have reviewed the  patient's current medications.  Results for orders placed or performed during the hospital encounter of 03/31/21 (from the past 48 hour(s))  CBG monitoring, ED     Status: Abnormal   Collection Time: 03/31/21  2:38 PM  Result Value Ref Range   Glucose-Capillary 119 (H) 70 - 99 mg/dL    Comment: Glucose reference range applies only to samples taken after fasting for at least 8 hours.  CBG monitoring, ED     Status: None   Collection Time: 03/31/21  3:38 PM  Result Value Ref Range   Glucose-Capillary 77 70 - 99 mg/dL    Comment: Glucose reference range applies only to samples taken after fasting for at least 8 hours.  Lactic acid, plasma     Status: None   Collection Time: 03/31/21  6:00 PM  Result Value Ref Range   Lactic Acid, Venous 1.4 0.5 - 1.9 mmol/L    Comment: Performed at [facility], [address] [place], [place] [address]  Comprehensive metabolic panel     Status: Abnormal   Collection Time: 03/31/21  6:00 PM  Result Value Ref Range   Sodium 136 135 - 145 mmol/L   Potassium 4.7 3.5 - 5.1 mmol/L   Chloride 102 98 - 111 mmol/L   CO2 23 22 - 32 mmol/L   Glucose, Bld 77 70 - 99 mg/dL    Comment: Glucose reference range applies only to samples taken after fasting for at least 8 hours.   BUN 33 (H) 8 - 23 mg/dL   Creatinine, Ser 8.41 (H) 0.61 - 1.24 mg/dL   Calcium 9.1 8.9 - 32.4 mg/dL   Total Protein 8.4 (H) 6.5 - 8.1 g/dL   Albumin 3.7 3.5 - 5.0 g/dL   AST 33 15 - 41 U/L   ALT 26 0 - 44 U/L   Alkaline Phosphatase 134 (H) 38 - 126 U/L   Total Bilirubin 0.5 0.3 - 1.2 mg/dL   GFR, Estimated 25 (L) >60 mL/min    Comment: (NOTE) Calculated using the CKD-EPI Creatinine Equation (2021)    Anion gap 11 5 - 15    Comment: Performed at [facility], [address] [place], [place] [address]  CBC WITH DIFFERENTIAL     Status: Abnormal   Collection Time: 03/31/21  6:00 PM  Result Value Ref Range   WBC 16.1 (H) 4.0 - 10.5 K/uL   RBC  3.53 (L) 4.22 - 5.81 MIL/uL   Hemoglobin 9.8 (L) 13.0 - 17.0 g/dL   HCT 72.5 (L) 36.6 - 44.0 %  MCV 86.7 80.0 - 100.0 fL   MCH 27.8 26.0 - 34.0 pg   MCHC 32.0 30.0 - 36.0 g/dL   RDW 13.2 11.5 - 15.5 %   Platelets 242 150 - 400 K/uL   nRBC 0.0 0.0 - 0.2 %   Neutrophils Relative % 78 %   Neutro Abs 12.7 (H) 1.7 - 7.7 K/uL   Lymphocytes Relative 11 %   Lymphs Abs 1.7 0.7 - 4.0 K/uL   Monocytes Relative 9 %   Monocytes Absolute 1.4 (H) 0.1 - 1.0 K/uL   Eosinophils Relative 1 %   Eosinophils Absolute 0.1 0.0 - 0.5 K/uL   Basophils Relative 0 %   Basophils Absolute 0.0 0.0 - 0.1 K/uL   Immature Granulocytes 1 %   Abs Immature Granulocytes 0.18 (H) 0.00 - 0.07 K/uL    Comment: Performed at [facility], [place] [address], [place], [place] [address]  Protime-INR     Status: None   Collection Time: 03/31/21  6:00 PM  Result Value Ref Range   Prothrombin Time 14.3 11.4 - 15.2 seconds   INR 1.1 0.8 - 1.2    Comment: (NOTE) INR goal varies based on device and disease states. Performed at [facility], [place] [address], [place], [place] [address]   APTT     Status: Abnormal   Collection Time: 03/31/21  6:00 PM  Result Value Ref Range   aPTT 23 (L) 24 - 36 seconds    Comment: Performed at [facility], [place] [address] [place], [place] [address]  CBG monitoring, ED     Status: Abnormal   Collection Time: 03/31/21  6:03 PM  Result Value Ref Range   Glucose-Capillary 63 (L) 70 - 99 mg/dL    Comment: Glucose reference range applies only to samples taken after fasting for at least 8 hours.  Resp Panel by RT-PCR (Flu A&B, Covid) Nasopharyngeal Swab     Status: Abnormal   Collection Time: 03/31/21  7:52 PM   Specimen: Nasopharyngeal Swab; Nasopharyngeal(NP) swabs in vial transport medium  Result Value Ref Range   SARS Coronavirus 2 by RT PCR POSITIVE (A) NEGATIVE    Comment: RESULT CALLED TO, READ BACK BY AND VERIFIED WITH: BANNO,A  03/31/21 @2147  BY SEELMOL (NOTE) SARS-CoV-2 target nucleic acids are DETECTED.  The SARS-CoV-2 RNA is generally detectable in upper respiratory specimens during the acute phase of infection. Positive results are indicative of the presence of the identified virus, but do not rule out bacterial infection or co-infection with other pathogens not detected by the test. Clinical correlation with patient history and other diagnostic information is necessary to determine patient infection status. The expected result is Negative.  Fact Sheet for Patients: EntrepreneurPulse.com.au  Fact Sheet for Healthcare Providers: IncredibleEmployment.be  This test is not yet approved or cleared by the [place] FDA and  has been authorized for detection and/or diagnosis of SARS-CoV-2 by FDA under an Emergency Use Authorization (EUA).  This EUA will remain in effect (meaning this test can be  used) for the duration of  the COVID-19 declaration under Section 564(b)(1) of the Act, 21 U.S.C. section 360bbb-3(b)(1), unless the authorization is terminated or revoked sooner.     Influenza A by PCR NEGATIVE NEGATIVE   Influenza B by PCR NEGATIVE NEGATIVE    Comment: (NOTE) The Xpert Xpress SARS-CoV-2/FLU/RSV plus assay is intended as an aid in the diagnosis of influenza from Nasopharyngeal swab specimens and should not be used as a  sole basis for treatment. Nasal washings and aspirates are unacceptable for Xpert Xpress SARS-CoV-2/FLU/RSV testing.  Fact Sheet for Patients: EntrepreneurPulse.com.au  Fact Sheet for Healthcare Providers: IncredibleEmployment.be  This test is not yet approved or cleared by the [place] FDA and has been authorized for detection and/or diagnosis of SARS-CoV-2 by FDA under an Emergency Use Authorization (EUA). This EUA will remain in effect (meaning this test can be used) for the duration of  the COVID-19 declaration under Section 564(b)(1) of the Act, 21 U.S.C. section 360bbb-3(b)(1), unless the authorization is terminated or revoked.  Performed at [facility], [place] [address], [place], [place] [address]   CBG monitoring, ED     Status: Abnormal   Collection Time: 03/31/21  8:54 PM  Result Value Ref Range   Glucose-Capillary 58 (L) 70 - 99 mg/dL    Comment: Glucose reference range applies only to samples taken after fasting for at least 8 hours.  Lactic acid, plasma     Status: None   Collection Time: 03/31/21 10:42 PM  Result Value Ref Range   Lactic Acid, Venous 1.5 0.5 - 1.9 mmol/L    Comment: Performed at [facility], [place] [address], [place], [place] [address]  CBG monitoring, ED     Status: Abnormal   Collection Time: 03/31/21 11:19 PM  Result Value Ref Range   Glucose-Capillary 170 (H) 70 - 99 mg/dL    Comment: Glucose reference range applies only to samples taken after fasting for at least 8 hours.  CBC     Status: Abnormal   Collection Time: 04/01/21  7:06 AM  Result Value Ref Range   WBC 11.4 (H) 4.0 - 10.5 K/uL   RBC 3.05 (L) 4.22 - 5.81 MIL/uL   Hemoglobin 8.3 (L) 13.0 - 17.0 g/dL   HCT 26.0 (L) 39.0 - 52.0 %   MCV 85.2 80.0 - 100.0 fL   MCH 27.2 26.0 - 34.0 pg   MCHC 31.9 30.0 - 36.0 g/dL   RDW 13.2 11.5 - 15.5 %   Platelets 266 150 - 400 K/uL   nRBC 0.0 0.0 - 0.2 %    Comment: Performed at [facility], [place] [address] [place], [place] [address]  Comprehensive metabolic panel     Status: Abnormal   Collection Time: 04/01/21  7:06 AM  Result Value Ref Range   Sodium 133 (L) 135 - 145 mmol/L   Potassium 4.1 3.5 - 5.1 mmol/L   Chloride 103 98 - 111 mmol/L   CO2 22 22 - 32 mmol/L   Glucose, Bld 133 (H) 70 - 99 mg/dL    Comment: Glucose reference range applies only to samples taken after fasting for at least 8 hours.   BUN 33 (H) 8 - 23 mg/dL   Creatinine, Ser 2.04 (H) 0.61 - 1.24 mg/dL    Calcium 8.3 (L) 8.9 - 10.3 mg/dL   Total Protein 6.6 6.5 - 8.1 g/dL   Albumin 2.7 (L) 3.5 - 5.0 g/dL   AST 23 15 - 41 U/L   ALT 20 0 - 44 U/L   Alkaline Phosphatase 104 38 - 126 U/L   Total Bilirubin 0.6 0.3 - 1.2 mg/dL   GFR, Estimated 33 (L) >60 mL/min    Comment: (NOTE) Calculated using the CKD-EPI Creatinine Equation (2021)    Anion gap 8 5 - 15    Comment: Performed at [facility], [place] [address] [place], [place] [address]  Glucose, capillary  Status: Abnormal   Collection Time: 04/01/21  7:40 AM  Result Value Ref Range   Glucose-Capillary 135 (H) 70 - 99 mg/dL    Comment: Glucose reference range applies only to samples taken after fasting for at least 8 hours.    CT Abdomen Pelvis Wo Contrast  Result Date: 03/31/2021 CLINICAL DATA:  Abdominal pain and fever. Prostate cancer with seeds. Acute kidney injury. Rectal pain and fissure. EXAM: CT ABDOMEN AND PELVIS WITHOUT CONTRAST TECHNIQUE: Multidetector CT imaging of the abdomen and pelvis was performed following the standard protocol without IV contrast. COMPARISON:  Abdominopelvic CT 09/17/2012 FINDINGS: Lower chest: Bibasilar atelectasis. No pleural fluid. Coronary artery calcification or stents. Small hiatal hernia. Hepatobiliary: No focal hepatic abnormality on this noncontrast exam. Depending gallstones. There may be a also minimal wall calcification of the gallbladder fundus. No pericholecystic fat stranding. No biliary dilatation. Pancreas: Mild fatty atrophy.  No ductal dilatation or inflammation. Spleen: Normal in size without focal abnormality. Adrenals/Urinary Tract: Normal adrenal glands. No hydronephrosis. No renal calculi. Trace bilateral perinephric edema, likely chronic. Decompressed ureters. No bladder wall thickening. Stomach/Bowel: Punctate metallic density in the stomach may be a clip. Small hiatal hernia. No small bowel obstruction, wall thickening, or inflammation. Appendix is not confidently  visualized. No appendicitis. Metallic density in the cecum may represent a surgical clip. Moderate volume of colonic stool. Sigmoid colon is redundant. There is sigmoid colonic diverticulosis without diverticulitis. Vascular/Lymphatic: Aortic atherosclerosis. No aortic aneurysm. Prominent periportal nodes are likely reactive. Scattered bilateral inguinal nodes also likely reactive. Reproductive: Brachytherapy seeds in the prostate. Large right hydrocele. Other: There is soft tissue gas involving the left and midline of the perineum, extending to the base of the penis. No minimal adjacent ill-defined fluid. Soft tissue stranding which extends to the left gluteal crease. There is no definitive connection to the anorectum. No intra-abdominopelvic fluid collection. Small fat containing umbilical hernia. Fat containing bilateral inguinal hernias. Skin thickening and soft tissue edema of the left anterior abdominal wall typical of medication injection sites. Musculoskeletal: Right hip arthroplasty. No focal bone lesion or acute osseous abnormality. Lumbar degenerative change. IMPRESSION: 1. Soft tissue gas involving the left and midline of the perineum, extending to the posterior base of the penis. Soft tissue stranding which extends to the left gluteal crease. There is no definitive connection to the anorectum. While findings may represent an ill-defined perirectal fistula, the possibility of necrotizing fasciitis is raised given the patchy soft tissue air. 2. Cholelithiasis without CT findings of acute cholecystitis. 3. Colonic diverticulosis without diverticulitis. 4. Small hiatal hernia. 5. Fat containing bilateral inguinal hernias. Aortic Atherosclerosis (ICD10-I70.0). These results were called by telephone at the time of interpretation on 03/31/2021 at 8:51 pm to provider Fredia Sorrow , who verbally acknowledged these results. Electronically Signed   By: Keith Rake M.D.   On: 03/31/2021 20:51   DG Chest  Port 1 View  Result Date: 03/31/2021 CLINICAL DATA:  Weakness and fever. Recent COVID-19 positive. History of prostate carcinoma. EXAM: PORTABLE CHEST 1 VIEW COMPARISON:  October 20, 2020 chest radiograph; CT chest January 20, 2021 FINDINGS: There is mild scarring in the left base. Lungs elsewhere are clear. Heart size and pulmonary vascularity are normal. No adenopathy. No bone lesions. IMPRESSION: Mild scarring left base. No edema or airspace opacity. Heart size within normal limits. Electronically Signed   By: Lowella Grip III M.D.   On: 03/31/2021 15:23    Review of Systems  Constitutional: Negative.   HENT: Negative.  Eyes: Negative.   Respiratory: Negative.   Cardiovascular: Negative.   Gastrointestinal: Positive for diarrhea and rectal pain.  Endocrine: Negative.   Genitourinary: Negative.   Musculoskeletal: Negative.   Skin: Negative.   Allergic/Immunologic: Negative.   Neurological: Negative.   Hematological: Negative.   Psychiatric/Behavioral: Negative.    Blood pressure (!) 134/54, pulse 91, temperature (!) 100.6 F (38.1 C), temperature source Oral, resp. rate 20, height 5\' 10"  (1.778 m), weight 103.9 kg, SpO2 96 %. Physical Exam Constitutional:      General: [name] is not in acute distress.    Appearance: Normal appearance. [name] is obese.  HENT:     Head: Normocephalic and atraumatic.     Right Ear: External ear normal.     Left Ear: External ear normal.     Nose: Nose normal.     Mouth/Throat:     Mouth: Mucous membranes are moist.     Pharynx: Oropharynx is clear.  Eyes:     General: No scleral icterus.    Extraocular Movements: Extraocular movements intact.     Conjunctiva/sclera: Conjunctivae normal.     Pupils: Pupils are equal, round, and reactive to light.  Cardiovascular:     Rate and Rhythm: Normal rate and regular rhythm.     Pulses: Normal pulses.     Heart sounds: Normal heart sounds.  Abdominal:     Palpations: Abdomen is soft.     Tenderness:  There is no abdominal tenderness.     Comments: obese  Genitourinary:    Comments: There is mild tenderness in the perirectal area bilaterally. No cellulitis or fluctuance Musculoskeletal:        General: No swelling or deformity. Normal range of motion.     Cervical back: Normal range of motion and neck supple. No tenderness.  Skin:    General: Skin is warm and dry.     Coloration: Skin is not jaundiced.  Neurological:     General: No focal deficit present.     Mental Status: [name] is alert and oriented to person, place, and time.  Psychiatric:        Mood and Affect: Mood normal.        Behavior: Behavior normal.        Thought Content: Thought content normal.     Assessment/Plan: The patient appears to possibly have a necrotizing soft tissue infection of the perirectal space. [name] is already showing signs of other organ injury with an elevated Cr. At this point I would recommend surgical drainage of the perirectal space. I have discussed with him in detail the risks and benefits of the surgery as well as some of the technical aspects and [name] understands and wishes to proceed. Continue abx and monitoring in the icu  Autumn Messing III 04/01/2021, 8:18 AM

## 2021-04-01 NOTE — Anesthesia Procedure Notes (Signed)
Procedure Name: LMA Insertion Performed by: Rosaland Lao, CRNA Pre-anesthesia Checklist: Patient identified, Emergency Drugs available, Suction available and Patient being monitored Patient Re-evaluated:Patient Re-evaluated prior to induction Oxygen Delivery Method: Circle system utilized Preoxygenation: Pre-oxygenation with 100% oxygen Induction Type: IV induction LMA: LMA with gastric port inserted LMA Size: 5.0 Number of attempts: 1 Placement Confirmation: positive ETCO2 and breath sounds checked- equal and bilateral Tube secured with: Tape Dental Injury: Teeth and Oropharynx as per pre-operative assessment

## 2021-04-01 NOTE — Consult Note (Signed)
Urology Consult   Physician requesting consult: Autumn Messing, MD  Reason for consult: Perirectal abscess following brachytherapy  History of Present Illness: [name] is a 75 y.o. male with a history of grade 2 prostate cancer, status post brachytherapy seed placement in December 2021 and perirectal abscess I&D in July 2021.  He presented to the emergency department after being evaluated in our office with worsening rectal pain, fevers of 101 F and general malaise.  CT abdomen and pelvis from yesterday revealed a perirectal/perineal abscess.  He is currently undergoing abscess I&D with Dr. Marlou Starks.  Past Medical History:  Diagnosis Date  . Abrasion of face    11-15-2020  per pt stated has small area under one ear,  no drainage , no dressing, putting neosporin on it  . Anemia associated with chronic renal failure   . Anxiety   . Arthritis   . Chronic constipation   . Chronic gout   . Chronic gout    per pt last episode approx. 2005  (followed [place] )  . Chronic pruritic rash in adult    intermittant on back  . CKD (chronic kidney disease), stage III ([place])    followed by nephrologist @ [place]  . Diverticulosis of colon   . DOE (dyspnea on exertion)    long distance walking and up flight of stairs but states ok after rest couple minutes  . Dyspnea   . History of agent Orange exposure   . History of basal cell carcinoma excision    back, stomach, face  . 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 History of DVT of lower extremity   . History of kidney stones   . Hyperplasia of prostate with lower urinary tract symptoms (LUTS)   . Hypertension    followed by [facility]cp [facility]   . OSA on CPAP    severe per study in epic 08-31-2015,  states uses nightly  . Peripheral neuropathy   . Prostate cancer [facility]) urologist--- dr Tresa Moore;  oncologist--- dr Tammi Klippel    first dx 02/ 2014 active survillance;  repeat bx 08-29-2020 ,  Stage T1c, Gleason 3+4  . Type 2 diabetes mellitus treated with insulin ([place])    followed by pcp @ [place]   (11-15-2020  per pt check blood sugar twice daily,  fasting sugar --- 110--120)  . Wears glasses   . Wears hearing aid in both ears    HOH even when he wears aids    Past Surgical History:  Procedure Laterality Date  . APPENDECTOMY  age 56  . BLEPHAROPLASTY Bilateral 2015   upper eyelid  . CYSTOSCOPY N/A 11/16/2020   Procedure: CYSTOSCOPY FLEXIBLE;  Surgeon: Alexis Frock, MD;  Location: [facility];  Service: Urology;  Laterality: N/A;  No seeds detected in bladder per Dr. Tresa Moore  . CYSTOSCOPY/RETROGRADE/URETEROSCOPY/STONE EXTRACTION WITH BASKET  2002  . MANDIBLE RECONSTRUCTION Left 2014   w/ cadaver bone  . MOHS SURGERY  2012   nose  . PILONIDAL CYST EXCISION  05/2020  . RADIOACTIVE SEED IMPLANT N/A 11/16/2020   Procedure: RADIOACTIVE SEED IMPLANT/BRACHYTHERAPY IMPLANT;  Surgeon: Alexis Frock, MD;  [facility];  Service: Urology;  Laterality: N/A;  90 MINS  . SPACE OAR INSTILLATION N/A 11/16/2020   Procedure: SPACE OAR INSTILLATION;  Surgeon: Alexis Frock, MD;  Location: [facility];  Service: Urology;  Laterality: N/A;  .  TOTAL HIP ARTHROPLASTY Right 10-26-2003  @[facility]   . TOTAL KNEE ARTHROPLASTY Right 01/22/2017   Procedure: RIGHT TOTAL KNEE ARTHROPLASTY;  Surgeon: Paralee Cancel, MD;  Location: [facility];  Service: Orthopedics;  Laterality: Right;    Current Hospital Medications:  Home Meds:  Current Meds  Medication Sig  . acetaminophen (TYLENOL) 500 MG tablet Take 1,000 mg by mouth every 6 (six) hours as needed for moderate pain.  Marland Kitchen amitriptyline (ELAVIL) 50 MG tablet Take 50 mg by mouth at bedtime.   Marland Kitchen amLODipine (NORVASC) 10 MG tablet Take 10 mg by mouth at bedtime.  Marland Kitchen aspirin EC 81 MG tablet Take 162 mg by mouth daily. Swallow whole.  Marland Kitchen atorvastatin  (LIPITOR) 20 MG tablet Take 20 mg by mouth at bedtime.  Marland Kitchen buPROPion (WELLBUTRIN XL) 150 MG 24 hr tablet Take 150 mg by mouth daily.  . camphor-menthol (SARNA) lotion Apply 1 application topically daily.  . carvedilol (COREG) 12.5 MG tablet Take 12.5 mg by mouth daily.  . Cholecalciferol 50 MCG (2000 UT) TABS Take 2,000 tablets by mouth daily.  . colchicine 0.6 MG tablet Take 0.6 mg by mouth daily as needed (gout).  . febuxostat (ULORIC) 40 MG tablet Take 40 mg by mouth at bedtime.  . ferrous sulfate (FERROUSUL) 325 (65 FE) MG tablet Take 1 tablet (325 mg total) by mouth 3 (three) times daily with meals. (Patient taking differently: Take 325 mg by mouth daily.)  . gabapentin (NEURONTIN) 300 MG capsule Take 600 mg by mouth at bedtime.  . hydrocortisone (ANUSOL-HC) 25 MG suppository Place 1 suppository (25 mg total) rectally 2 (two) times daily.  . hydrocortisone 2.5 % cream Apply 1 application topically at bedtime.  . insulin glargine (LANTUS) 100 UNIT/ML injection Inject 45 Units into the skin at bedtime.  Marland Kitchen lisinopril (PRINIVIL,ZESTRIL) 10 MG tablet Take 10 mg by mouth every evening.  . neomycin-bacitracin-polymyxin (NEOSPORIN) ointment Apply 1 application topically daily as needed for wound care.  . Pyridoxine HCl (VITAMIN B-6 PO) Take 2 tablets by mouth daily.   . tamsulosin (FLOMAX) 0.4 MG CAPS capsule Take 1 capsule (0.4 mg total) by mouth daily after supper.  . Tetrahydrozoline HCl (EYE DROPS OP) Place 3 drops into both eyes daily.  . vitamin B-12 (CYANOCOBALAMIN) 1000 MCG tablet Take 1,000 mcg by mouth daily.    Scheduled Meds: . [MAR Hold] amitriptyline  50 mg Oral QHS  . [MAR Hold] atorvastatin  20 mg Oral QHS  . [MAR Hold] buPROPion  150 mg Oral Daily  . [MAR Hold] Chlorhexidine Gluconate Cloth  6 each Topical Daily  . [MAR Hold] febuxostat  40 mg Oral QHS  . [MAR Hold] ferrous sulfate  325 mg Oral Q breakfast  . [MAR Hold] gabapentin  600 mg Oral QHS  . [MAR Hold] insulin aspart   0-9 Units Subcutaneous Q4H  . [MAR Hold] mouth rinse  15 mL Mouth Rinse BID  . [MAR Hold] tetrahydrozoline  3 drop Both Eyes Daily   Continuous Infusions: . [MAR Hold] clindamycin (CLEOCIN) IV Stopped (04/01/21 0737)  . dextrose 5 % and 0.45% NaCl 100 mL/hr at 03/31/21 2102  . phenylephrine    . [MAR Hold] piperacillin-tazobactam (ZOSYN)  IV 3.375 g (04/01/21 0800)  . [MAR Hold] vancomycin     PRN Meds:.[MAR Hold] acetaminophen **OR** [MAR Hold] acetaminophen, fentaNYL (SUBLIMAZE) injection, [MAR Hold]  HYDROmorphone (DILAUDID) injection, [MAR Hold] ondansetron **OR** [MAR Hold] ondansetron (ZOFRAN) IV, ondansetron (ZOFRAN) IV, sodium chloride irrigation  Allergies: No Known Allergies  Family  History  Problem Relation Age of Onset  . Dementia Mother   . Hypertension Mother   . Diabetes Mother   . Heart attack Father 19  . Kidney cancer Brother        tumor removed  . Hypertension Brother   . Liver cancer Maternal Grandfather   . Testicular cancer Brother   . Cancer Paternal Aunt   . Colon cancer Neg Hx   . Colon polyps Neg Hx   . Esophageal cancer Neg Hx   . Stomach cancer Neg Hx   . Rectal cancer Neg Hx     Social History:  reports that he has never smoked. He has never used smokeless tobacco. He reports that he does not drink alcohol and does not use drugs.  ROS: A complete review of systems was performed.  All systems are negative except for pertinent findings as noted.  Physical Exam:  Vital signs in last 24 hours: Temp:  [98.6 F (37 C)-100.6 F (38.1 C)] 98.8 F (37.1 C) (05/14 1024) Pulse Rate:  [73-91] 81 (05/14 1035) Resp:  [16-25] 17 (05/14 1035) BP: (99-159)/(39-101) 101/59 (05/14 1035) SpO2:  [92 %-100 %] 96 % (05/14 1035) Weight:  [100.7 kg-103.9 kg] 103.9 kg (05/14 0500) Constitutional: Currently under general anesthesia GU: Mild to moderate scrotal edema with right-sided hydrocele.  No significant scrotal induration, erythema or cellulitis.  Both  testicles are soft with no mass or induration.  Rectal exam reveals a 1 cm defect involving the anterior rectum and immediately adjacent to the apex of the prostate.  Dr. Marlou Starks was able to express a significant amount of purulent fluid from perirectal space  Laboratory Data:  Recent Labs    03/31/21 1800 04/01/21 0706  WBC 16.1* 11.4*  HGB 9.8* 8.3*  HCT 30.6* 26.0*  PLT 242 266    Recent Labs    03/31/21 1800 04/01/21 0706  NA 136 133*  K 4.7 4.1  CL 102 103  GLUCOSE 77 133*  BUN 33* 33*  CALCIUM 9.1 8.3*  CREATININE 2.58* 2.04*     Results for orders placed or performed during the hospital encounter of 03/31/21 (from the past 24 hour(s))  CBG monitoring, ED     Status: Abnormal   Collection Time: 03/31/21  2:38 PM  Result Value Ref Range   Glucose-Capillary 119 (H) 70 - 99 mg/dL  CBG monitoring, ED     Status: None   Collection Time: 03/31/21  3:38 PM  Result Value Ref Range   Glucose-Capillary 77 70 - 99 mg/dL  Lactic acid, plasma     Status: None   Collection Time: 03/31/21  6:00 PM  Result Value Ref Range   Lactic Acid, Venous 1.4 0.5 - 1.9 mmol/L  Comprehensive metabolic panel     Status: Abnormal   Collection Time: 03/31/21  6:00 PM  Result Value Ref Range   Sodium 136 135 - 145 mmol/L   Potassium 4.7 3.5 - 5.1 mmol/L   Chloride 102 98 - 111 mmol/L   CO2 23 22 - 32 mmol/L   Glucose, Bld 77 70 - 99 mg/dL   BUN 33 (H) 8 - 23 mg/dL   Creatinine, Ser 2.58 (H) 0.61 - 1.24 mg/dL   Calcium 9.1 8.9 - 10.3 mg/dL   Total Protein 8.4 (H) 6.5 - 8.1 g/dL   Albumin 3.7 3.5 - 5.0 g/dL   AST 33 15 - 41 U/L   ALT 26 0 - 44 U/L   Alkaline Phosphatase 134 (H)  38 - 126 U/L   Total Bilirubin 0.5 0.3 - 1.2 mg/dL   GFR, Estimated 25 (L) >60 mL/min   Anion gap 11 5 - 15  CBC WITH DIFFERENTIAL     Status: Abnormal   Collection Time: 03/31/21  6:00 PM  Result Value Ref Range   WBC 16.1 (H) 4.0 - 10.5 K/uL   RBC 3.53 (L) 4.22 - 5.81 MIL/uL   Hemoglobin 9.8 (L) 13.0 - 17.0  g/dL   HCT 30.6 (L) 39.0 - 52.0 %   MCV 86.7 80.0 - 100.0 fL   MCH 27.8 26.0 - 34.0 pg   MCHC 32.0 30.0 - 36.0 g/dL   RDW 13.2 11.5 - 15.5 %   Platelets 242 150 - 400 K/uL   nRBC 0.0 0.0 - 0.2 %   Neutrophils Relative % 78 %   Neutro Abs 12.7 (H) 1.7 - 7.7 K/uL   Lymphocytes Relative 11 %   Lymphs Abs 1.7 0.7 - 4.0 K/uL   Monocytes Relative 9 %   Monocytes Absolute 1.4 (H) 0.1 - 1.0 K/uL   Eosinophils Relative 1 %   Eosinophils Absolute 0.1 0.0 - 0.5 K/uL   Basophils Relative 0 %   Basophils Absolute 0.0 0.0 - 0.1 K/uL   Immature Granulocytes 1 %   Abs Immature Granulocytes 0.18 (H) 0.00 - 0.07 K/uL  Protime-INR     Status: None   Collection Time: 03/31/21  6:00 PM  Result Value Ref Range   Prothrombin Time 14.3 11.4 - 15.2 seconds   INR 1.1 0.8 - 1.2  APTT     Status: Abnormal   Collection Time: 03/31/21  6:00 PM  Result Value Ref Range   aPTT 23 (L) 24 - 36 seconds  CBG monitoring, ED     Status: Abnormal   Collection Time: 03/31/21  6:03 PM  Result Value Ref Range   Glucose-Capillary 63 (L) 70 - 99 mg/dL  Resp Panel by RT-PCR (Flu A&B, Covid) Nasopharyngeal Swab     Status: Abnormal   Collection Time: 03/31/21  7:52 PM   Specimen: Nasopharyngeal Swab; Nasopharyngeal(NP) swabs in vial transport medium  Result Value Ref Range   SARS Coronavirus 2 by RT PCR POSITIVE (A) NEGATIVE   Influenza A by PCR NEGATIVE NEGATIVE   Influenza B by PCR NEGATIVE NEGATIVE  CBG monitoring, ED     Status: Abnormal   Collection Time: 03/31/21  8:54 PM  Result Value Ref Range   Glucose-Capillary 58 (L) 70 - 99 mg/dL  Lactic acid, plasma     Status: None   Collection Time: 03/31/21 10:42 PM  Result Value Ref Range   Lactic Acid, Venous 1.5 0.5 - 1.9 mmol/L  CBG monitoring, ED     Status: Abnormal   Collection Time: 03/31/21 11:19 PM  Result Value Ref Range   Glucose-Capillary 170 (H) 70 - 99 mg/dL  MRSA PCR Screening     Status: None   Collection Time: 04/01/21  3:45 AM   Specimen:  Nasal Mucosa; Nasopharyngeal  Result Value Ref Range   MRSA by PCR NEGATIVE NEGATIVE  CBC     Status: Abnormal   Collection Time: 04/01/21  7:06 AM  Result Value Ref Range   WBC 11.4 (H) 4.0 - 10.5 K/uL   RBC 3.05 (L) 4.22 - 5.81 MIL/uL   Hemoglobin 8.3 (L) 13.0 - 17.0 g/dL   HCT 26.0 (L) 39.0 - 52.0 %   MCV 85.2 80.0 - 100.0 fL   MCH 27.2 26.0 -  34.0 pg   MCHC 31.9 30.0 - 36.0 g/dL   RDW 13.2 11.5 - 15.5 %   Platelets 266 150 - 400 K/uL   nRBC 0.0 0.0 - 0.2 %  Comprehensive metabolic panel     Status: Abnormal   Collection Time: 04/01/21  7:06 AM  Result Value Ref Range   Sodium 133 (L) 135 - 145 mmol/L   Potassium 4.1 3.5 - 5.1 mmol/L   Chloride 103 98 - 111 mmol/L   CO2 22 22 - 32 mmol/L   Glucose, Bld 133 (H) 70 - 99 mg/dL   BUN 33 (H) 8 - 23 mg/dL   Creatinine, Ser 2.04 (H) 0.61 - 1.24 mg/dL   Calcium 8.3 (L) 8.9 - 10.3 mg/dL   Total Protein 6.6 6.5 - 8.1 g/dL   Albumin 2.7 (L) 3.5 - 5.0 g/dL   AST 23 15 - 41 U/L   ALT 20 0 - 44 U/L   Alkaline Phosphatase 104 38 - 126 U/L   Total Bilirubin 0.6 0.3 - 1.2 mg/dL   GFR, Estimated 33 (L) >60 mL/min   Anion gap 8 5 - 15  Glucose, capillary     Status: Abnormal   Collection Time: 04/01/21  7:40 AM  Result Value Ref Range   Glucose-Capillary 135 (H) 70 - 99 mg/dL  Glucose, capillary     Status: Abnormal   Collection Time: 04/01/21 10:15 AM  Result Value Ref Range   Glucose-Capillary 101 (H) 70 - 99 mg/dL   Recent Results (from the past 240 hour(s))  Resp Panel by RT-PCR (Flu A&B, Covid) Nasopharyngeal Swab     Status: Abnormal   Collection Time: 03/31/21  7:52 PM   Specimen: Nasopharyngeal Swab; Nasopharyngeal(NP) swabs in vial transport medium  Result Value Ref Range Status   SARS Coronavirus 2 by RT PCR POSITIVE (A) NEGATIVE Final    Comment: RESULT CALLED TO, READ BACK BY AND VERIFIED WITH: BANNO,A 03/31/21 @2147  BY SEELMOL (NOTE) SARS-CoV-2 target nucleic acids are DETECTED.  The SARS-CoV-2 RNA is generally  detectable in upper respiratory specimens during the acute phase of infection. Positive results are indicative of the presence of the identified virus, but do not rule out bacterial infection or co-infection with other pathogens not detected by the test. Clinical correlation with patient history and other diagnostic information is necessary to determine patient infection status. The expected result is Negative.  Fact Sheet for Patients: EntrepreneurPulse.com.au  Fact Sheet for Healthcare Providers: IncredibleEmployment.be  This test is not yet approved or cleared by the [place] FDA and  has been authorized for detection and/or diagnosis of SARS-CoV-2 by FDA under an Emergency Use Authorization (EUA).  This EUA will remain in effect (meaning this test can be  used) for the duration of  the COVID-19 declaration under Section 564(b)(1) of the Act, 21 U.S.C. section 360bbb-3(b)(1), unless the authorization is terminated or revoked sooner.     Influenza A by PCR NEGATIVE NEGATIVE Final   Influenza B by PCR NEGATIVE NEGATIVE Final    Comment: (NOTE) The Xpert Xpress SARS-CoV-2/FLU/RSV plus assay is intended as an aid in the diagnosis of influenza from Nasopharyngeal swab specimens and should not be used as a sole basis for treatment. Nasal washings and aspirates are unacceptable for Xpert Xpress SARS-CoV-2/FLU/RSV testing.  Fact Sheet for Patients: EntrepreneurPulse.com.au  Fact Sheet for Healthcare Providers: IncredibleEmployment.be  This test is not yet approved or cleared by the [place] FDA and has been authorized for detection and/or diagnosis of  SARS-CoV-2 by FDA under an Emergency Use Authorization (EUA). This EUA will remain in effect (meaning this test can be used) for the duration of the COVID-19 declaration under Section 564(b)(1) of the Act, 21 U.S.C. section 360bbb-3(b)(1), unless the  authorization is terminated or revoked.  Performed at [facility], [place] [address], [place], [place] [address]   MRSA PCR Screening     Status: None   Collection Time: 04/01/21  3:45 AM   Specimen: Nasal Mucosa; Nasopharyngeal  Result Value Ref Range Status   MRSA by PCR NEGATIVE NEGATIVE Final    Comment:        The GeneXpert MRSA Assay (FDA approved for NASAL specimens only), is one component of a comprehensive MRSA colonization surveillance program. It is not intended to diagnose MRSA infection nor to guide or monitor treatment for MRSA infections. Performed at [facility], [place] [address], [place], [place] [address]     Renal Function: Recent Labs    03/31/21 1800 04/01/21 0706  CREATININE 2.58* 2.04*   Estimated Creatinine Clearance: 37.8 mL/min (A) (by C-G formula based on SCr of 2.04 mg/dL (H)).  Radiologic Imaging: CT Abdomen Pelvis Wo Contrast  Result Date: 03/31/2021 CLINICAL DATA:  Abdominal pain and fever. Prostate cancer with seeds. Acute kidney injury. Rectal pain and fissure. EXAM: CT ABDOMEN AND PELVIS WITHOUT CONTRAST TECHNIQUE: Multidetector CT imaging of the abdomen and pelvis was performed following the standard protocol without IV contrast. COMPARISON:  Abdominopelvic CT 09/17/2012 FINDINGS: Lower chest: Bibasilar atelectasis. No pleural fluid. Coronary artery calcification or stents. Small hiatal hernia. Hepatobiliary: No focal hepatic abnormality on this noncontrast exam. Depending gallstones. There may be a also minimal wall calcification of the gallbladder fundus. No pericholecystic fat stranding. No biliary dilatation. Pancreas: Mild fatty atrophy.  No ductal dilatation or inflammation. Spleen: Normal in size without focal abnormality. Adrenals/Urinary Tract: Normal adrenal glands. No hydronephrosis. No renal calculi. Trace bilateral perinephric edema, likely chronic. Decompressed ureters. No bladder wall thickening.  Stomach/Bowel: Punctate metallic density in the stomach may be a clip. Small hiatal hernia. No small bowel obstruction, wall thickening, or inflammation. Appendix is not confidently visualized. No appendicitis. Metallic density in the cecum may represent a surgical clip. Moderate volume of colonic stool. Sigmoid colon is redundant. There is sigmoid colonic diverticulosis without diverticulitis. Vascular/Lymphatic: Aortic atherosclerosis. No aortic aneurysm. Prominent periportal nodes are likely reactive. Scattered bilateral inguinal nodes also likely reactive. Reproductive: Brachytherapy seeds in the prostate. Large right hydrocele. Other: There is soft tissue gas involving the left and midline of the perineum, extending to the base of the penis. No minimal adjacent ill-defined fluid. Soft tissue stranding which extends to the left gluteal crease. There is no definitive connection to the anorectum. No intra-abdominopelvic fluid collection. Small fat containing umbilical hernia. Fat containing bilateral inguinal hernias. Skin thickening and soft tissue edema of the left anterior abdominal wall typical of medication injection sites. Musculoskeletal: Right hip arthroplasty. No focal bone lesion or acute osseous abnormality. Lumbar degenerative change. IMPRESSION: 1. Soft tissue gas involving the left and midline of the perineum, extending to the posterior base of the penis. Soft tissue stranding which extends to the left gluteal crease. There is no definitive connection to the anorectum. While findings may represent an ill-defined perirectal fistula, the possibility of necrotizing fasciitis is raised given the patchy soft tissue air. 2. Cholelithiasis without CT findings of acute cholecystitis. 3. Colonic diverticulosis without diverticulitis. 4. Small hiatal hernia. 5. Fat containing bilateral inguinal hernias. Aortic  Atherosclerosis (ICD10-I70.0). These results were called by telephone at the time of interpretation  on 03/31/2021 at 8:51 pm to provider Fredia Sorrow , who verbally acknowledged these results. Electronically Signed   By: Keith Rake M.D.   On: 03/31/2021 20:51   DG Chest Port 1 View  Result Date: 03/31/2021 CLINICAL DATA:  Weakness and fever. Recent COVID-19 positive. History of prostate carcinoma. EXAM: PORTABLE CHEST 1 VIEW COMPARISON:  October 20, 2020 chest radiograph; CT chest January 20, 2021 FINDINGS: There is mild scarring in the left base. Lungs elsewhere are clear. Heart size and pulmonary vascularity are normal. No adenopathy. No bone lesions. IMPRESSION: Mild scarring left base. No edema or airspace opacity. Heart size within normal limits. Electronically Signed   By: Lowella Grip III M.D.   On: 03/31/2021 15:23    I independently reviewed the above imaging studies.  Impression/Recommendation 75 year old male with a history of prostate cancer, status post brachytherapy seed placement in December 2021 now with recurrent perirectal abscess  -Unclear if his perirectal abscess is secondary to field effect from his recent brachytherapy.  Successful I&D with general surgery today.  Cultures are pending.  Continue empiric antibiotic coverage.  No additional surgical needs from a urologic standpoint at this time.  Ellison Hughs, MD [facility] 04/01/2021, 10:43 AM

## 2021-04-02 ENCOUNTER — Encounter (HOSPITAL_COMMUNITY): Payer: Self-pay | Admitting: General Surgery

## 2021-04-02 DIAGNOSIS — N493 Fournier gangrene: Secondary | ICD-10-CM | POA: Diagnosis not present

## 2021-04-02 LAB — URINE CULTURE: Culture: NO GROWTH

## 2021-04-02 LAB — CBC WITH DIFFERENTIAL/PLATELET
Abs Immature Granulocytes: 0.2 10*3/uL — ABNORMAL HIGH (ref 0.00–0.07)
Basophils Absolute: 0 10*3/uL (ref 0.0–0.1)
Basophils Relative: 0 %
Eosinophils Absolute: 0 10*3/uL (ref 0.0–0.5)
Eosinophils Relative: 0 %
HCT: 24.3 % — ABNORMAL LOW (ref 39.0–52.0)
Hemoglobin: 7.7 g/dL — ABNORMAL LOW (ref 13.0–17.0)
Immature Granulocytes: 2 %
Lymphocytes Relative: 7 %
Lymphs Abs: 0.8 10*3/uL (ref 0.7–4.0)
MCH: 27.3 pg (ref 26.0–34.0)
MCHC: 31.7 g/dL (ref 30.0–36.0)
MCV: 86.2 fL (ref 80.0–100.0)
Monocytes Absolute: 0.6 10*3/uL (ref 0.1–1.0)
Monocytes Relative: 5 %
Neutro Abs: 10.1 10*3/uL — ABNORMAL HIGH (ref 1.7–7.7)
Neutrophils Relative %: 86 %
Platelets: 279 10*3/uL (ref 150–400)
RBC: 2.82 MIL/uL — ABNORMAL LOW (ref 4.22–5.81)
RDW: 13 % (ref 11.5–15.5)
WBC: 11.7 10*3/uL — ABNORMAL HIGH (ref 4.0–10.5)
nRBC: 0 % (ref 0.0–0.2)

## 2021-04-02 LAB — BASIC METABOLIC PANEL
Anion gap: 9 (ref 5–15)
BUN: 35 mg/dL — ABNORMAL HIGH (ref 8–23)
CO2: 21 mmol/L — ABNORMAL LOW (ref 22–32)
Calcium: 8.7 mg/dL — ABNORMAL LOW (ref 8.9–10.3)
Chloride: 107 mmol/L (ref 98–111)
Creatinine, Ser: 2.02 mg/dL — ABNORMAL HIGH (ref 0.61–1.24)
GFR, Estimated: 34 mL/min — ABNORMAL LOW (ref >60)
Glucose, Bld: 251 mg/dL — ABNORMAL HIGH (ref 70–99)
Potassium: 4.4 mmol/L (ref 3.5–5.1)
Sodium: 137 mmol/L (ref 135–145)

## 2021-04-02 LAB — GLUCOSE, CAPILLARY
Glucose-Capillary: 228 mg/dL — ABNORMAL HIGH (ref 70–99)
Glucose-Capillary: 216 mg/dL — ABNORMAL HIGH (ref 70–99)
Glucose-Capillary: 217 mg/dL — ABNORMAL HIGH (ref 70–99)
Glucose-Capillary: 253 mg/dL — ABNORMAL HIGH (ref 70–99)
Glucose-Capillary: 200 mg/dL — ABNORMAL HIGH (ref 70–99)

## 2021-04-02 LAB — C-REACTIVE PROTEIN: CRP: 23.9 mg/dL — ABNORMAL HIGH (ref <1.0)

## 2021-04-02 MED ORDER — PROSOURCE PLUS PO LIQD
30.0000 mL | Freq: Three times a day (TID) | ORAL | Status: DC
  Administered 2021-04-02 – 2021-04-10 (×21): 30 mL via ORAL
  Filled 2021-04-02 (×24): qty 30

## 2021-04-02 MED ORDER — PROSOURCE PLUS PO LIQD: 30.0000 mL | Freq: Two times a day (BID) | ORAL | Status: DC

## 2021-04-02 MED ORDER — ADULT MULTIVITAMIN W/MINERALS CH
1.0000 | ORAL_TABLET | Freq: Every day | ORAL | Status: DC
  Administered 2021-04-02 – 2021-04-11 (×10): 1 via ORAL
  Filled 2021-04-02 (×10): qty 1

## 2021-04-02 MED ORDER — PROSOURCE PLUS PO LIQD: 30.0000 mL | Freq: Every day | ORAL | Status: DC

## 2021-04-02 MED ORDER — BOOST / RESOURCE BREEZE PO LIQD CUSTOM: 1.0000 | Freq: Two times a day (BID) | ORAL | Status: DC

## 2021-04-02 MED ORDER — BOOST / RESOURCE BREEZE PO LIQD CUSTOM
1.0000 | Freq: Three times a day (TID) | ORAL | Status: DC
  Administered 2021-04-02 – 2021-04-05 (×6): 1 via ORAL

## 2021-04-02 MED ORDER — INSULIN GLARGINE 100 UNIT/ML ~~LOC~~ SOLN
20.0000 [IU] | Freq: Every day | SUBCUTANEOUS | Status: DC
  Administered 2021-04-02 – 2021-04-03 (×2): 20 [IU] via SUBCUTANEOUS
  Filled 2021-04-02 (×2): qty 0.2

## 2021-04-02 MED ORDER — ENSURE ENLIVE PO LIQD: 237.0000 mL | Freq: Two times a day (BID) | ORAL | Status: DC

## 2021-04-02 NOTE — Progress Notes (Signed)
1 Day Post-Op Subjective: No complaints this morning.  He is voiding without difficulty is orchialgia or dysuria.  Objective: Vital signs in last 24 hours: Temp:  [97.6 F (36.4 C)-98.5 F (36.9 C)] 98.5 F (36.9 C) (05/15 0800) Pulse Rate:  [57-85] 57 (05/15 0854) Resp:  [12-22] 16 (05/15 0854) BP: (99-157)/(44-59) 131/55 (05/15 0854) SpO2:  [94 %-100 %] 100 % (05/15 0854)  Intake/Output from previous day: 05/14 0701 - 05/15 0700 In: 1210.1 [P.O.:200; I.V.:846.8; IV Piggyback:163.3] Out: 790 [Urine:775; Blood:15]  Intake/Output this shift: Total I/O In: 504.4 [I.V.:313.2; IV Piggyback:191.2] Out: -   Physical Exam:  General: Alert and oriented GU: No scrotal erythema or signs of cellulitis.  Palpable right hydrocele.  Both testes are nontender and without mass or lesions.  Lab Results: Recent Labs    03/31/21 1800 04/01/21 0706 04/02/21 0447  HGB 9.8* 8.3* 7.7*  HCT 30.6* 26.0* 24.3*   BMET Recent Labs    04/01/21 0706 04/02/21 0447  NA 133* 137  K 4.1 4.4  CL 103 107  CO2 22 21*  GLUCOSE 133* 251*  BUN 33* 35*  CREATININE 2.04* 2.02*  CALCIUM 8.3* 8.7*     Studies/Results: CT Abdomen Pelvis Wo Contrast  Result Date: 03/31/2021 CLINICAL DATA:  Abdominal pain and fever. Prostate cancer with seeds. Acute kidney injury. Rectal pain and fissure. EXAM: CT ABDOMEN AND PELVIS WITHOUT CONTRAST TECHNIQUE: Multidetector CT imaging of the abdomen and pelvis was performed following the standard protocol without IV contrast. COMPARISON:  Abdominopelvic CT 09/17/2012 FINDINGS: Lower chest: Bibasilar atelectasis. No pleural fluid. Coronary artery calcification or stents. Small hiatal hernia. Hepatobiliary: No focal hepatic abnormality on this noncontrast exam. Depending gallstones. There may be a also minimal wall calcification of the gallbladder fundus. No pericholecystic fat stranding. No biliary dilatation. Pancreas: Mild fatty atrophy.  No ductal dilatation or  inflammation. Spleen: Normal in size without focal abnormality. Adrenals/Urinary Tract: Normal adrenal glands. No hydronephrosis. No renal calculi. Trace bilateral perinephric edema, likely chronic. Decompressed ureters. No bladder wall thickening. Stomach/Bowel: Punctate metallic density in the stomach may be a clip. Small hiatal hernia. No small bowel obstruction, wall thickening, or inflammation. Appendix is not confidently visualized. No appendicitis. Metallic density in the cecum may represent a surgical clip. Moderate volume of colonic stool. Sigmoid colon is redundant. There is sigmoid colonic diverticulosis without diverticulitis. Vascular/Lymphatic: Aortic atherosclerosis. No aortic aneurysm. Prominent periportal nodes are likely reactive. Scattered bilateral inguinal nodes also likely reactive. Reproductive: Brachytherapy seeds in the prostate. Large right hydrocele. Other: There is soft tissue gas involving the left and midline of the perineum, extending to the base of the penis. No minimal adjacent ill-defined fluid. Soft tissue stranding which extends to the left gluteal crease. There is no definitive connection to the anorectum. No intra-abdominopelvic fluid collection. Small fat containing umbilical hernia. Fat containing bilateral inguinal hernias. Skin thickening and soft tissue edema of the left anterior abdominal wall typical of medication injection sites. Musculoskeletal: Right hip arthroplasty. No focal bone lesion or acute osseous abnormality. Lumbar degenerative change. IMPRESSION: 1. Soft tissue gas involving the left and midline of the perineum, extending to the posterior base of the penis. Soft tissue stranding which extends to the left gluteal crease. There is no definitive connection to the anorectum. While findings may represent an ill-defined perirectal fistula, the possibility of necrotizing fasciitis is raised given the patchy soft tissue air. 2. Cholelithiasis without CT findings of  acute cholecystitis. 3. Colonic diverticulosis without diverticulitis. 4. Small hiatal hernia. 5.  Fat containing bilateral inguinal hernias. Aortic Atherosclerosis (ICD10-I70.0). These results were called by telephone at the time of interpretation on 03/31/2021 at 8:51 pm to provider Fredia Sorrow , who verbally acknowledged these results. Electronically Signed   By: Keith Rake M.D.   On: 03/31/2021 20:51   DG Chest Port 1 View  Result Date: 03/31/2021 CLINICAL DATA:  Weakness and fever. Recent COVID-19 positive. History of prostate carcinoma. EXAM: PORTABLE CHEST 1 VIEW COMPARISON:  October 20, 2020 chest radiograph; CT chest January 20, 2021 FINDINGS: There is mild scarring in the left base. Lungs elsewhere are clear. Heart size and pulmonary vascularity are normal. No adenopathy. No bone lesions. IMPRESSION: Mild scarring left base. No edema or airspace opacity. Heart size within normal limits. Electronically Signed   By: Lowella Grip III M.D.   On: 03/31/2021 15:23    Assessment/Plan: 75 year old male with a history of prostate cancer, status post brachytherapy seed placement in December 2021 now with recurrent perirectal abscess  -Stable GU exam.  No signs of acute infection -General surgery team considering diverting colostomy due to size of rectal defect The recent radiation.    LOS: 2 days   Ellison Hughs, MD [facility] Pager: [phone]  04/02/2021, 10:26 AM

## 2021-04-02 NOTE — Progress Notes (Signed)
Patient was seen exiting the bed by nursing staff. Patient was only oriented to himself, but was able to follow directions given. Patient was steady on his feet but had removed his midline IV access and all other equipment. Patient was easily re-oriented and another stat IV consult was placed. Earlier in the shift the patient had told me that last night he had a similar episode. He has had minimal interruptions and medications throughout the night so I am unsure if there is underlying dementia- none seen in H&P. Will continue to monitor, will consider placing mitts on patient if confusion persists or reoccurs.

## 2021-04-02 NOTE — Progress Notes (Addendum)
PROGRESS NOTE    DEMONTAE Glover  [id number] DOB: 14-May-1946 DOA: 03/31/2021 PCP: [facility]    No chief complaint on file.   Brief Narrative:  History of hypertension, diabetes, CVA in 2015,CKD 3A, anemia of chronic disease, gout, OSA, prostate cancer status post seeds placed in December, previous perirectal abscess without fistula, with c/o 5 day h/o rectal pain, associated scrotal pain and swelling , diarrhea ,was at urologist office who noted fever 101.4, sent to ED  WBC 16.1k.  Creat 2.5 up from 1.6 baseline, blood glucose 55, Lactate nl, no hypotension CT A/P: soft tissue gas involving L and midline of perineum extending to posterior base of penis.  No definate connection to anorectum.  Concern for necrotizing fasciitis.   Subjective:  POD#1, he is alert and oriented x3 this am, very pleasant  He reports  remembering he was confused last night  no fever, blood pressure stable, he is on room air  Assessment & Plan:   Principal Problem:   Fournier's gangrene Active Problems:   DM (diabetes mellitus) ([place])   CKD (chronic kidney disease)   HTN (hypertension)   Malignant neoplasm of prostate ([place])   Cellulitis of perineum   AKI (acute kidney injury) ([place])   Perirectal abscess/Necrotizing fasciitis/sepsis present on admission -With fever, leukocytosis, tachypnea, AKI -Blood culture no growth, MRSA screening negative , surgical wound culture + G-rods -He started on Vanco/Zosyn/clindamycin on admission, d/c vanc  -s/p  I&D on 5/14 , will follow general surgery and urology recommendation  Post op delirium? One episode of confusion on 5/14-5/15 night No confusion currently Monitor   Insulin-dependent type 2 diabetes, present with hypoglycemia,  H/o peripheral neuropathy  Blood glucose 55, initially received a D10 in ED, then briefly on D5 half-normal saline infusion A1c 6.5 Am blood glucose 251 this am, resume lantus   AKI on CKD 3, anemia of chronic  disease Creatinine 2.58 on presentation, baseline creatinine 1.6 Hold lisinopril Renal dosing medication Repeat BMP in the morning  hypertension On Coreg 12.5, Norvasc, lisinopril Continue Coreg with lower dose with holding parameters, hold Norvasc and lisinopril   Prostate cancer status post seeds , management per urology  COVID-19 infection He was tested +2 weeks ago, reports symptom has improved/ resolved, repeat testing on admission remain positive Chest x-ray no acute findings No hypoxia Fever/tachypnea could be explained by perirectal abscess     Body mass index is 32.87 kg/m.[place]        DVT prophylaxis: SCDs Start: 03/31/21 2240   Code Status:full Family Communication: wife at bedside on 5/14 Disposition:   Status is: Inpatient  Dispo: The patient is from: home              Anticipated d/c is to: home              Anticipated d/c date is: TBD, needs surgery clearance                 Consultants:   gen surg  urology  Procedures:    on 5/14: [place], PLACEMENT OF [place]  Antimicrobials:   Anti-infectives (From admission, onward)   Start     Dose/Rate Route Frequency Ordered Stop   04/02/21 2300  vancomycin (VANCOREADY) IVPB 1250 mg/250 mL  Status:  Discontinued        1,250 mg 166.7 mL/hr over 90 Minutes Intravenous Every 48 hours 03/31/21 2244 04/02/21 1045   04/01/21 0800  piperacillin-tazobactam (ZOSYN) IVPB  3.375 g        3.375 g 12.5 mL/hr over 240 Minutes Intravenous Every 8 hours 03/31/21 2243     03/31/21 2245  clindamycin (CLEOCIN) IVPB 600 mg        600 mg 100 mL/hr over 30 Minutes Intravenous Every 8 hours 03/31/21 2231     03/31/21 2245  vancomycin (VANCOREADY) IVPB 2000 mg/400 mL        2,000 mg 200 mL/hr over 120 Minutes Intravenous STAT 03/31/21 2236 04/01/21 0747   03/31/21 2115  piperacillin-tazobactam (ZOSYN) IVPB 3.375 g        3.375 g 100 mL/hr over 30 Minutes Intravenous  Once  03/31/21 2113 03/31/21 2321   03/31/21 2100  piperacillin-tazobactam (ZOSYN) IVPB 3.375 g  Status:  Discontinued        3.375 g 100 mL/hr over 30 Minutes Intravenous  Once 03/31/21 2056 03/31/21 2058   03/31/21 2100  piperacillin-tazobactam (ZOSYN) IVPB 4.5 g  Status:  Discontinued        4.5 g 200 mL/hr over 30 Minutes Intravenous  Once 03/31/21 2058 03/31/21 2112          Objective: Vitals:   04/02/21 0014 04/02/21 0400 04/02/21 0800 04/02/21 0854  BP: (!) 157/57 (!) 124/57  (!) 131/55  Pulse: 63 66  (!) 57  Resp: 12 (!) 22  16  Temp:  97.6 F (36.4 C) 98.5 F (36.9 C)   TempSrc:  Oral Oral   SpO2: 99% 99%  100%  Weight:      Height:        Intake/Output Summary (Last 24 hours) at 04/02/2021 1136 Last data filed at 04/02/2021 0916 Gross per 24 hour  Intake 1714.49 ml  Output --  Net 1714.49 ml   Filed Weights   03/31/21 1521 04/01/21 0500  Weight: 100.7 kg 103.9 kg    Examination:  General exam: calm, NAD Respiratory system: Clear to auscultation. Respiratory effort normal. Cardiovascular system: S1 & S2 heard, RRR. No JVD, no murmur, No pedal edema. Gastrointestinal system: Abdomen is nondistended, soft and nontender.  Normal bowel sounds heard. Central nervous system: Alert and oriented. No focal neurological deficits. Extremities: Symmetric 5 x 5 power. Skin: No rashes, lesions or ulcers Psychiatry: Judgement and insight appear normal. Mood & affect appropriate.     Data Reviewed: I have personally reviewed following labs and imaging studies  CBC: Recent Labs  Lab 03/31/21 1800 04/01/21 0706 04/02/21 0447  WBC 16.1* 11.4* 11.7*  NEUTROABS 12.7*  --  10.1*  HGB 9.8* 8.3* 7.7*  HCT 30.6* 26.0* 24.3*  MCV 86.7 85.2 86.2  PLT 242 266 509    Basic Metabolic Panel: Recent Labs  Lab 03/31/21 1800 04/01/21 0706 04/02/21 0447  NA 136 133* 137  K 4.7 4.1 4.4  CL 102 103 107  CO2 23 22 21*  GLUCOSE 77 133* 251*  BUN 33* 33* 35*  CREATININE  2.58* 2.04* 2.02*  CALCIUM 9.1 8.3* 8.7*    GFR: Estimated Creatinine Clearance: 38.2 mL/min (A) (by C-G formula based on SCr of 2.02 mg/dL (H)).  Liver Function Tests: Recent Labs  Lab 03/31/21 1800 04/01/21 0706  AST 33 23  ALT 26 20  ALKPHOS 134* 104  BILITOT 0.5 0.6  PROT 8.4* 6.6  ALBUMIN 3.7 2.7*    CBG: Recent Labs  Lab 04/01/21 1459 04/01/21 1931 04/01/21 2337 04/02/21 0410 04/02/21 0741  GLUCAP 174* 279* 294* 228* 216*     Recent Results (from the past 240  hour(s))  Blood culture (routine single)     Status: None (Preliminary result)   Collection Time: 03/31/21  6:00 PM   Specimen: BLOOD  Result Value Ref Range Status   Specimen Description   Final    BLOOD BLOOD LEFT HAND Performed at [facility] [address] [place], [place] [address]    Special Requests   Final    BOTTLES DRAWN AEROBIC AND ANAEROBIC Blood Culture adequate volume Performed at [facility] [address] [place], [place] [address]    Culture   Final    NO GROWTH 2 DAYS Performed at [facility] [address] [place], [place] [address]    Report Status PENDING  Incomplete  Resp Panel by RT-PCR (Flu A&B, Covid) Nasopharyngeal Swab     Status: Abnormal   Collection Time: 03/31/21  7:52 PM   Specimen: Nasopharyngeal Swab; Nasopharyngeal(NP) swabs in vial transport medium  Result Value Ref Range Status   SARS Coronavirus 2 by RT PCR POSITIVE (A) NEGATIVE Final    Comment: RESULT CALLED TO, READ BACK BY AND VERIFIED WITH: BANNO,A 03/31/21 @2147  BY SEELMOL (NOTE) SARS-CoV-2 target nucleic acids are DETECTED.  The SARS-CoV-2 RNA is generally detectable in upper respiratory specimens during the acute phase of infection. Positive results are indicative of the presence of the identified virus, but do not rule out bacterial infection or co-infection with other pathogens not detected by the test. Clinical correlation with patient  history and other diagnostic information is necessary to determine patient infection status. The expected result is Negative.  Fact Sheet for Patients: EntrepreneurPulse.com.au  Fact Sheet for Healthcare Providers: IncredibleEmployment.be  This test is not yet approved or cleared by the [place] FDA and  has been authorized for detection and/or diagnosis of SARS-CoV-2 by FDA under an Emergency Use Authorization (EUA).  This EUA will remain in effect (meaning this test can be  used) for the duration of  the COVID-19 declaration under Section 564(b)(1) of the Act, 21 U.S.C. section 360bbb-3(b)(1), unless the authorization is terminated or revoked sooner.     Influenza A by PCR NEGATIVE NEGATIVE Final   Influenza B by PCR NEGATIVE NEGATIVE Final    Comment: (NOTE) The Xpert Xpress SARS-CoV-2/FLU/RSV plus assay is intended as an aid in the diagnosis of influenza from Nasopharyngeal swab specimens and should not be used as a sole basis for treatment. Nasal washings and aspirates are unacceptable for Xpert Xpress SARS-CoV-2/FLU/RSV testing.  Fact Sheet for Patients: EntrepreneurPulse.com.au  Fact Sheet for Healthcare Providers: IncredibleEmployment.be  This test is not yet approved or cleared by the [place] FDA and has been authorized for detection and/or diagnosis of SARS-CoV-2 by FDA under an Emergency Use Authorization (EUA). This EUA will remain in effect (meaning this test can be used) for the duration of the COVID-19 declaration under Section 564(b)(1) of the Act, 21 U.S.C. section 360bbb-3(b)(1), unless the authorization is terminated or revoked.  Performed at [facility], [place] [address] [place], [place] [address]   MRSA PCR Screening     Status: None   Collection Time: 04/01/21  3:45 AM   Specimen: Nasal Mucosa; Nasopharyngeal  Result Value Ref Range Status   MRSA  by PCR NEGATIVE NEGATIVE Final    Comment:        The GeneXpert MRSA Assay (FDA approved for NASAL specimens only), is one component of a comprehensive MRSA colonization surveillance program. It is not intended to diagnose MRSA infection nor  to guide or monitor treatment for MRSA infections. Performed at [facility], [place] [address], [place], [place] [address]   Culture, blood (Routine X 2) w Reflex to ID Panel     Status: None (Preliminary result)   Collection Time: 04/01/21  7:06 AM   Specimen: BLOOD  Result Value Ref Range Status   Specimen Description   Final    BLOOD LEFT HAND Performed at [facility] [address] [place], [place] [address]    Special Requests   Final    BOTTLES DRAWN AEROBIC ONLY Blood Culture adequate volume Performed at [facility] [address], [place], [place] [address]    Culture   Final    NO GROWTH < 24 HOURS Performed at [facility] [address] [place], [place] [address]    Report Status PENDING  Incomplete  Aerobic/Anaerobic Culture w Gram Stain (surgical/deep wound)     Status: None (Preliminary result)   Collection Time: 04/01/21 10:29 AM   Specimen: PATH Other; Tissue  Result Value Ref Range Status   Specimen Description   Final    PERINEAL ABCESS Performed at [facility] [address], [place], [place] [address]    Special Requests   Final    NONE Performed at [facility], [place] [address] [place], [place] [address]    Gram Stain PENDING  Incomplete   Culture   Final    FEW GRAM NEGATIVE RODS IDENTIFICATION AND SUSCEPTIBILITIES TO FOLLOW Performed at [facility], [place] [address], [place],  [address]    Report Status PENDING  Incomplete         Radiology Studies: CT Abdomen Pelvis Wo Contrast  Result Date: 03/31/2021 CLINICAL DATA:  Abdominal pain and fever. Prostate cancer with seeds. Acute  kidney injury. Rectal pain and fissure. EXAM: CT ABDOMEN AND PELVIS WITHOUT CONTRAST TECHNIQUE: Multidetector CT imaging of the abdomen and pelvis was performed following the standard protocol without IV contrast. COMPARISON:  Abdominopelvic CT 09/17/2012 FINDINGS: Lower chest: Bibasilar atelectasis. No pleural fluid. Coronary artery calcification or stents. Small hiatal hernia. Hepatobiliary: No focal hepatic abnormality on this noncontrast exam. Depending gallstones. There may be a also minimal wall calcification of the gallbladder fundus. No pericholecystic fat stranding. No biliary dilatation. Pancreas: Mild fatty atrophy.  No ductal dilatation or inflammation. Spleen: Normal in size without focal abnormality. Adrenals/Urinary Tract: Normal adrenal glands. No hydronephrosis. No renal calculi. Trace bilateral perinephric edema, likely chronic. Decompressed ureters. No bladder wall thickening. Stomach/Bowel: Punctate metallic density in the stomach may be a clip. Small hiatal hernia. No small bowel obstruction, wall thickening, or inflammation. Appendix is not confidently visualized. No appendicitis. Metallic density in the cecum may represent a surgical clip. Moderate volume of colonic stool. Sigmoid colon is redundant. There is sigmoid colonic diverticulosis without diverticulitis. Vascular/Lymphatic: Aortic atherosclerosis. No aortic aneurysm. Prominent periportal nodes are likely reactive. Scattered bilateral inguinal nodes also likely reactive. Reproductive: Brachytherapy seeds in the prostate. Large right hydrocele. Other: There is soft tissue gas involving the left and midline of the perineum, extending to the base of the penis. No minimal adjacent ill-defined fluid. Soft tissue stranding which extends to the left gluteal crease. There is no definitive connection to the anorectum. No intra-abdominopelvic fluid collection. Small fat containing umbilical hernia. Fat containing bilateral inguinal hernias. Skin  thickening and soft tissue edema of the left anterior abdominal wall typical of medication injection sites. Musculoskeletal: Right hip  arthroplasty. No focal bone lesion or acute osseous abnormality. Lumbar degenerative change. IMPRESSION: 1. Soft tissue gas involving the left and midline of the perineum, extending to the posterior base of the penis. Soft tissue stranding which extends to the left gluteal crease. There is no definitive connection to the anorectum. While findings may represent an ill-defined perirectal fistula, the possibility of necrotizing fasciitis is raised given the patchy soft tissue air. 2. Cholelithiasis without CT findings of acute cholecystitis. 3. Colonic diverticulosis without diverticulitis. 4. Small hiatal hernia. 5. Fat containing bilateral inguinal hernias. Aortic Atherosclerosis (ICD10-I70.0). These results were called by telephone at the time of interpretation on 03/31/2021 at 8:51 pm to provider Fredia Sorrow , who verbally acknowledged these results. Electronically Signed   By: Keith Rake M.D.   On: 03/31/2021 20:51   DG Chest Port 1 View  Result Date: 03/31/2021 CLINICAL DATA:  Weakness and fever. Recent COVID-19 positive. History of prostate carcinoma. EXAM: PORTABLE CHEST 1 VIEW COMPARISON:  October 20, 2020 chest radiograph; CT chest January 20, 2021 FINDINGS: There is mild scarring in the left base. Lungs elsewhere are clear. Heart size and pulmonary vascularity are normal. No adenopathy. No bone lesions. IMPRESSION: Mild scarring left base. No edema or airspace opacity. Heart size within normal limits. Electronically Signed   By: Lowella Grip III M.D.   On: 03/31/2021 15:23        Scheduled Meds: . amitriptyline  50 mg Oral QHS  . atorvastatin  20 mg Oral QHS  . buPROPion  150 mg Oral Daily  . carvedilol  3.125 mg Oral BID WC  . Chlorhexidine Gluconate Cloth  6 each Topical Daily  . febuxostat  40 mg Oral QHS  . ferrous sulfate  325 mg Oral Q  breakfast  . gabapentin  600 mg Oral QHS  . insulin aspart  0-9 Units Subcutaneous Q4H  . mouth rinse  15 mL Mouth Rinse BID  . sodium chloride flush  10-40 mL Intracatheter Q12H  . tetrahydrozoline  3 drop Both Eyes Daily   Continuous Infusions: . sodium chloride 75 mL/hr at 04/02/21 0858  . clindamycin (CLEOCIN) IV Stopped (04/02/21 [id number])  . piperacillin-tazobactam (ZOSYN)  IV 3.375 g (04/02/21 0858)     LOS: 2 days   Time spent: 53mins Greater than 50% of this time was spent in counseling, explanation of diagnosis, planning of further management, and coordination of care.   Voice Recognition Viviann Spare dictation system was used to create this note, attempts have been made to correct errors. Please contact the author with questions and/or clarifications.   Florencia Reasons, MD PhD FACP [facility]  Available via Epic secure chat 7am-7pm for nonurgent issues Please page for urgent issues To page the attending provider between 7A-7P or the covering provider during after hours 7P-7A, please log into the web site [email or web address] and access using universal [place] password for that web site. If you do not have the password, please call the hospital operator.    04/02/2021, 11:36 AM

## 2021-04-02 NOTE — Progress Notes (Signed)
Checked to see if pt was ready to go on cpap. Pt says not at this time. Will check in with him later to place him on cpap.

## 2021-04-02 NOTE — Progress Notes (Signed)
Unable to obtain COVID test results. Pt reports that he performed an at-home test about 2 wks ago. Verified with IP and Xu MD that pt is okay to continue standard precautions for pt since symptoms have resolved.

## 2021-04-02 NOTE — Progress Notes (Signed)
1 Day Post-Op   Subjective/Chief Complaint: Resting comfortably   Objective: Vital signs in last 24 hours: Temp:  [97.6 F (36.4 C)-100.6 F (38.1 C)] 97.6 F (36.4 C) (05/15 0400) Pulse Rate:  [62-87] 66 (05/15 0400) Resp:  [12-22] 22 (05/15 0400) BP: (99-157)/(44-72) 124/57 (05/15 0400) SpO2:  [94 %-100 %] 99 % (05/15 0400) Last BM Date: 03/31/21  Intake/Output from previous day: 05/14 0701 - 05/15 0700 In: 1210.1 [P.O.:200; I.V.:846.8; IV Piggyback:163.3] Out: 790 [Urine:775; Blood:15] Intake/Output this shift: No intake/output data recorded.  General appearance: alert and cooperative Resp: clear to auscultation bilaterally Cardio: regular rate and rhythm GI: soft, nontender  Lab Results:  Recent Labs    04/01/21 0706 04/02/21 0447  WBC 11.4* 11.7*  HGB 8.3* 7.7*  HCT 26.0* 24.3*  PLT 266 279   BMET Recent Labs    04/01/21 0706 04/02/21 0447  NA 133* 137  K 4.1 4.4  CL 103 107  CO2 22 21*  GLUCOSE 133* 251*  BUN 33* 35*  CREATININE 2.04* 2.02*  CALCIUM 8.3* 8.7*   PT/INR Recent Labs    03/31/21 1800  LABPROT 14.3  INR 1.1   ABG No results for input(s): PHART, HCO3 in the last 72 hours.  Invalid input(s): PCO2, PO2  Studies/Results: CT Abdomen Pelvis Wo Contrast  Result Date: 03/31/2021 CLINICAL DATA:  Abdominal pain and fever. Prostate cancer with seeds. Acute kidney injury. Rectal pain and fissure. EXAM: CT ABDOMEN AND PELVIS WITHOUT CONTRAST TECHNIQUE: Multidetector CT imaging of the abdomen and pelvis was performed following the standard protocol without IV contrast. COMPARISON:  Abdominopelvic CT 09/17/2012 FINDINGS: Lower chest: Bibasilar atelectasis. No pleural fluid. Coronary artery calcification or stents. Small hiatal hernia. Hepatobiliary: No focal hepatic abnormality on this noncontrast exam. Depending gallstones. There may be a also minimal wall calcification of the gallbladder fundus. No pericholecystic fat stranding. No biliary  dilatation. Pancreas: Mild fatty atrophy.  No ductal dilatation or inflammation. Spleen: Normal in size without focal abnormality. Adrenals/Urinary Tract: Normal adrenal glands. No hydronephrosis. No renal calculi. Trace bilateral perinephric edema, likely chronic. Decompressed ureters. No bladder wall thickening. Stomach/Bowel: Punctate metallic density in the stomach may be a clip. Small hiatal hernia. No small bowel obstruction, wall thickening, or inflammation. Appendix is not confidently visualized. No appendicitis. Metallic density in the cecum may represent a surgical clip. Moderate volume of colonic stool. Sigmoid colon is redundant. There is sigmoid colonic diverticulosis without diverticulitis. Vascular/Lymphatic: Aortic atherosclerosis. No aortic aneurysm. Prominent periportal nodes are likely reactive. Scattered bilateral inguinal nodes also likely reactive. Reproductive: Brachytherapy seeds in the prostate. Large right hydrocele. Other: There is soft tissue gas involving the left and midline of the perineum, extending to the base of the penis. No minimal adjacent ill-defined fluid. Soft tissue stranding which extends to the left gluteal crease. There is no definitive connection to the anorectum. No intra-abdominopelvic fluid collection. Small fat containing umbilical hernia. Fat containing bilateral inguinal hernias. Skin thickening and soft tissue edema of the left anterior abdominal wall typical of medication injection sites. Musculoskeletal: Right hip arthroplasty. No focal bone lesion or acute osseous abnormality. Lumbar degenerative change. IMPRESSION: 1. Soft tissue gas involving the left and midline of the perineum, extending to the posterior base of the penis. Soft tissue stranding which extends to the left gluteal crease. There is no definitive connection to the anorectum. While findings may represent an ill-defined perirectal fistula, the possibility of necrotizing fasciitis is raised given  the patchy soft tissue air. 2. Cholelithiasis without  CT findings of acute cholecystitis. 3. Colonic diverticulosis without diverticulitis. 4. Small hiatal hernia. 5. Fat containing bilateral inguinal hernias. Aortic Atherosclerosis (ICD10-I70.0). These results were called by telephone at the time of interpretation on 03/31/2021 at 8:51 pm to provider Fredia Sorrow , who verbally acknowledged these results. Electronically Signed   By: Keith Rake M.D.   On: 03/31/2021 20:51   DG Chest Port 1 View  Result Date: 03/31/2021 CLINICAL DATA:  Weakness and fever. Recent COVID-19 positive. History of prostate carcinoma. EXAM: PORTABLE CHEST 1 VIEW COMPARISON:  October 20, 2020 chest radiograph; CT chest January 20, 2021 FINDINGS: There is mild scarring in the left base. Lungs elsewhere are clear. Heart size and pulmonary vascularity are normal. No adenopathy. No bone lesions. IMPRESSION: Mild scarring left base. No edema or airspace opacity. Heart size within normal limits. Electronically Signed   By: Lowella Grip III M.D.   On: 03/31/2021 15:23    Anti-infectives: Anti-infectives (From admission, onward)   Start     Dose/Rate Route Frequency Ordered Stop   04/02/21 2300  vancomycin (VANCOREADY) IVPB 1250 mg/250 mL        1,250 mg 166.7 mL/hr over 90 Minutes Intravenous Every 48 hours 03/31/21 2244     04/01/21 0800  piperacillin-tazobactam (ZOSYN) IVPB 3.375 g        3.375 g 12.5 mL/hr over 240 Minutes Intravenous Every 8 hours 03/31/21 2243     03/31/21 2245  clindamycin (CLEOCIN) IVPB 600 mg        600 mg 100 mL/hr over 30 Minutes Intravenous Every 8 hours 03/31/21 2231     03/31/21 2245  vancomycin (VANCOREADY) IVPB 2000 mg/400 mL        2,000 mg 200 mL/hr over 120 Minutes Intravenous STAT 03/31/21 2236 04/01/21 0747   03/31/21 2115  piperacillin-tazobactam (ZOSYN) IVPB 3.375 g        3.375 g 100 mL/hr over 30 Minutes Intravenous  Once 03/31/21 2113 03/31/21 2321   03/31/21 2100   piperacillin-tazobactam (ZOSYN) IVPB 3.375 g  Status:  Discontinued        3.375 g 100 mL/hr over 30 Minutes Intravenous  Once 03/31/21 2056 03/31/21 2058   03/31/21 2100  piperacillin-tazobactam (ZOSYN) IVPB 4.5 g  Status:  Discontinued        4.5 g 200 mL/hr over 30 Minutes Intravenous  Once 03/31/21 2058 03/31/21 2112      Assessment/Plan: s/p Procedure(s): EXAM  UNDER ANESTHESIA; ANOSCOPY; DRAINAGE OF PERIRECTAL ABSCESS; PLACEMENT OF SETON (N/A) Advance diet  Will remove packing tomorrow. Continue seton Will discuss with our colorectal docs how best to manage ulceration in rectum Continue abx  LOS: 2 days    Autumn Messing III 04/02/2021

## 2021-04-02 NOTE — Progress Notes (Signed)
Initial Nutrition Assessment  RD working remotely.   DOCUMENTATION CODES:   Obesity unspecified  INTERVENTION:  - will order Boost Breeze TID, each supplement provides 250 kcal and 9 grams of protein. - will order 30 ml Prosource Plus TID, each supplement provides 100 kcal and 15 grams protein.  - will order 1 tablet multivitamin with minerals/day. - diet advancement as medically feasible.  - complete NFPE when feasible.    NUTRITION DIAGNOSIS:   Increased nutrient needs related to acute illness,chronic illness as evidenced by estimated needs.  GOAL:   Patient will meet greater than or equal to 90% of their needs  MONITOR:   PO intake,Supplement acceptance,Diet advancement,Labs,Weight trends  REASON FOR ASSESSMENT:   Malnutrition Screening Tool  ASSESSMENT:   75 y.o. male with medical history of prostate cancer s/p seeds, stage 3 CKD, previous peri-rectal abscess without fistula, HTN, and type 2 DM. He was diagnosed with COVID 2 weeks PTA; symptoms have resolved. He presented to the ED d/t 5-day hx of worsening, severe rectal pain with associated scrotal pain and swelling and diarrhea.  Diet advanced from NPO to Carb Modified yesterday at 1230 and then downgraded to CLD at 1540. The only documented intake is 50% at 1500 yesterday.   He has not been seen by a [facility] RD at any time in the past. No information documented in the edema section of flow sheet.   Weight yesterday was 229 lb, weight on 5/13 was 222 lb, and PTA the most recently documented weight was 233 lb on 12/15/20. This indicates 11 lb weight loss (4.7% body weight); not significant for time frame.   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 Possible plan for diverting colostomy.  Urology is following.   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   Labs reviewed; CBGs: 228, 216, 217  mg/dl, BUN: 35 mg/dl, creatinine: 2.02 mg/dl, Ca: 8.7 mg/dl, GFR: 34 ml/min.  Medications reviewed; 325 mg ferrous sulfate/day, sliding scale novolog, 20 units lantus/day. IVF; NS @ 75 ml/hr.     NUTRITION - FOCUSED PHYSICAL EXAM:  unable to complete at this time.   Diet Order:   Diet Order            Diet clear liquid Room service appropriate? Yes; Fluid consistency: Thin  Diet effective now                 EDUCATION NEEDS:   Not appropriate for education at this time  Skin:  Skin Assessment: Skin Integrity Issues: Skin Integrity Issues:: Incisions Incisions: rectal (5/14)  Last BM:  5/13  Height:   Ht Readings from Last 1 Encounters:  04/01/21 5\' 10"  (1.778 m)    Weight:   Wt Readings from Last 1 Encounters:  04/01/21 103.9 kg     Estimated Nutritional Needs:  Kcal:  2200-2400 kcal Protein:  110-125 grams Fluid:  >/= 2.2 L/day      Jarome Matin, MS, RD, LDN, CNSC Inpatient Clinical Dietitian RD pager # available in [place]  After hours/weekend pager # available in [facility]

## 2021-04-03 DIAGNOSIS — Z794 Long term (current) use of insulin: Secondary | ICD-10-CM

## 2021-04-03 DIAGNOSIS — A419 Sepsis, unspecified organism: Secondary | ICD-10-CM

## 2021-04-03 DIAGNOSIS — E119 Type 2 diabetes mellitus without complications: Secondary | ICD-10-CM

## 2021-04-03 DIAGNOSIS — N189 Chronic kidney disease, unspecified: Secondary | ICD-10-CM

## 2021-04-03 DIAGNOSIS — E11649 Type 2 diabetes mellitus with hypoglycemia without coma: Secondary | ICD-10-CM

## 2021-04-03 DIAGNOSIS — N493 Fournier gangrene: Secondary | ICD-10-CM | POA: Diagnosis not present

## 2021-04-03 DIAGNOSIS — K611 Rectal abscess: Secondary | ICD-10-CM

## 2021-04-03 DIAGNOSIS — N1831 Chronic kidney disease, stage 3a: Secondary | ICD-10-CM

## 2021-04-03 DIAGNOSIS — D649 Anemia, unspecified: Secondary | ICD-10-CM

## 2021-04-03 DIAGNOSIS — N179 Acute kidney failure, unspecified: Secondary | ICD-10-CM

## 2021-04-03 DIAGNOSIS — I10 Essential (primary) hypertension: Secondary | ICD-10-CM

## 2021-04-03 DIAGNOSIS — R6520 Severe sepsis without septic shock: Secondary | ICD-10-CM

## 2021-04-03 DIAGNOSIS — C61 Malignant neoplasm of prostate: Secondary | ICD-10-CM

## 2021-04-03 LAB — CBC WITH DIFFERENTIAL/PLATELET
Abs Immature Granulocytes: 0.17 10*3/uL — ABNORMAL HIGH (ref 0.00–0.07)
Basophils Absolute: 0 10*3/uL (ref 0.0–0.1)
Basophils Relative: 1 %
Eosinophils Absolute: 0.3 10*3/uL (ref 0.0–0.5)
Eosinophils Relative: 3 %
HCT: 24.8 % — ABNORMAL LOW (ref 39.0–52.0)
Hemoglobin: 7.9 g/dL — ABNORMAL LOW (ref 13.0–17.0)
Immature Granulocytes: 2 %
Lymphocytes Relative: 20 %
Lymphs Abs: 1.7 10*3/uL (ref 0.7–4.0)
MCH: 27.9 pg (ref 26.0–34.0)
MCHC: 31.9 g/dL (ref 30.0–36.0)
MCV: 87.6 fL (ref 80.0–100.0)
Monocytes Absolute: 0.7 10*3/uL (ref 0.1–1.0)
Monocytes Relative: 9 %
Neutro Abs: 5.6 10*3/uL (ref 1.7–7.7)
Neutrophils Relative %: 65 %
Platelets: 287 10*3/uL (ref 150–400)
RBC: 2.83 MIL/uL — ABNORMAL LOW (ref 4.22–5.81)
RDW: 13.4 % (ref 11.5–15.5)
WBC: 8.5 10*3/uL (ref 4.0–10.5)
nRBC: 0 % (ref 0.0–0.2)

## 2021-04-03 LAB — GLUCOSE, CAPILLARY
Glucose-Capillary: 118 mg/dL — ABNORMAL HIGH (ref 70–99)
Glucose-Capillary: 125 mg/dL — ABNORMAL HIGH (ref 70–99)
Glucose-Capillary: 153 mg/dL — ABNORMAL HIGH (ref 70–99)
Glucose-Capillary: 162 mg/dL — ABNORMAL HIGH (ref 70–99)
Glucose-Capillary: 179 mg/dL — ABNORMAL HIGH (ref 70–99)
Glucose-Capillary: 193 mg/dL — ABNORMAL HIGH (ref 70–99)
Glucose-Capillary: 205 mg/dL — ABNORMAL HIGH (ref 70–99)
Glucose-Capillary: 207 mg/dL — ABNORMAL HIGH (ref 70–99)

## 2021-04-03 LAB — BASIC METABOLIC PANEL
Anion gap: 7 (ref 5–15)
BUN: 36 mg/dL — ABNORMAL HIGH (ref 8–23)
CO2: 24 mmol/L (ref 22–32)
Calcium: 8.5 mg/dL — ABNORMAL LOW (ref 8.9–10.3)
Chloride: 107 mmol/L (ref 98–111)
Creatinine, Ser: 1.75 mg/dL — ABNORMAL HIGH (ref 0.61–1.24)
GFR, Estimated: 40 mL/min — ABNORMAL LOW (ref >60)
Glucose, Bld: 206 mg/dL — ABNORMAL HIGH (ref 70–99)
Potassium: 4.3 mmol/L (ref 3.5–5.1)
Sodium: 138 mmol/L (ref 135–145)

## 2021-04-03 LAB — C-REACTIVE PROTEIN: CRP: 12.5 mg/dL — ABNORMAL HIGH (ref <1.0)

## 2021-04-03 LAB — MAGNESIUM: Magnesium: 2.4 mg/dL (ref 1.7–2.4)

## 2021-04-03 MED ORDER — LOPERAMIDE HCL 2 MG PO CAPS
2.0000 mg | ORAL_CAPSULE | Freq: Once | ORAL | Status: AC
  Administered 2021-04-03: 2 mg via ORAL
  Filled 2021-04-03: qty 1

## 2021-04-03 MED ORDER — CALCIUM POLYCARBOPHIL 625 MG PO TABS
625.0000 mg | ORAL_TABLET | Freq: Two times a day (BID) | ORAL | Status: DC
  Filled 2021-04-03: qty 1

## 2021-04-03 MED ORDER — AMLODIPINE BESYLATE 5 MG PO TABS
5.0000 mg | ORAL_TABLET | Freq: Every day | ORAL | Status: DC
  Administered 2021-04-03: 5 mg via ORAL
  Filled 2021-04-03: qty 1

## 2021-04-03 MED ORDER — SODIUM CHLORIDE 0.9 % IV SOLN: INTRAVENOUS | Status: DC

## 2021-04-03 MED ORDER — DOCUSATE SODIUM 100 MG PO CAPS: 100.0000 mg | ORAL_CAPSULE | Freq: Two times a day (BID) | ORAL | Status: DC

## 2021-04-03 NOTE — Progress Notes (Signed)
Pt refused cpap

## 2021-04-03 NOTE — Discharge Instructions (Signed)
Anal Fistulotomy, Care After This sheet gives you information about how to care for yourself after your procedure. Your health care provider may also give you more specific instructions. If you have problems or questions, contact your health care provider. What can I expect after the procedure? After the procedure, it is common to have:  Some pain, discomfort, and swelling.  Increased pain during bowel movements.  Some bleeding from the incision area.  Some leakage of stool. Follow these instructions at home: Medicines  Take over-the-counter and prescription medicines only as told by your health care provider.  If you were prescribed an antibiotic medicine, take it as told by your health care provider. Do not stop taking the antibiotic even if you start to feel better.  Ask your health care provider if the medicine prescribed to you requires you to avoid driving or using heavy machinery. Incision care  Follow instructions from your health care provider about how to take care of your incision. Make sure you: ? Wash your hands with soap and water before and after you remove your gauze (dressing). If soap and water are not available, use hand sanitizer. ? Remove your dressing as told by your health care provider.  In some cases, you may be told not to remove the dressing, but to allow it to come out with your first bowel movement after surgery. ? Leave stitches (sutures), skin glue, or adhesive strips in place. These skin closures may need to stay in place for 2 weeks or longer. Do not remove adhesive strips completely unless your health care provider tells you to do that.  Keep the incision area clean and dry.  Check your incision area every day for signs of infection. Check for: ? More redness, swelling, or pain. ? More fluid or blood. ? Warmth. ? Pus or a bad smell.   Self-care  After a bowel movement, clean the incision area using one of the following methods: ? Gently wipe  with a moist towelette. ? Gently wipe with mild soap and water. ? Take a shower. ? Take a sitz bath. This is a shallow, warm-water bath that attaches to the toilet bowl. You can also sit in a bathtub filled with warm water.  Do not swim or use a hot tub until your health care provider approves.  To reduce discomfort, you may apply ice to the incision area. To do this: ? Put ice in a plastic bag. ? Place a towel between your skin and the bag. ? Leave the ice on for 20 minutes, 2-3 times a day. Activity  Rest as told by your health care provider.  Avoid sitting for a long time without moving. Get up to take short walks every 1-2 hours. This is important to improve blood flow and breathing. Ask for help if you feel weak or unsteady.  Do not drive for 24 hours if you were given a sedative during your procedure.  Do not lift anything that is heavier than 10 lb (4.5 kg), or the limit that you are told, until your health care provider says that it is safe.  Return to your normal activities as told by your health care provider. Ask your health care provider what activities are safe for you.   Eating and drinking  Follow instructions from your health care provider about eating or drinking restrictions.  You may need to take these actions to prevent or treat constipation: ? Drink enough fluid to keep your urine pale yellow. ? Eat  foods that are high in fiber, such as beans, whole grains, and fresh fruits and vegetables. ? Limit foods that are high in fat and processed sugars, such as fried or sweet foods. Lifestyle  Do not use any products that contain nicotine or tobacco, such as cigarettes, e-cigarettes, and chewing tobacco. If you need help quitting, ask your health care provider.  Do not drink alcohol if: ? Your health care provider tells you not to drink. ? You are pregnant, may be pregnant, or are planning to become pregnant.  If you drink alcohol: ? Limit how much you use  to:  0-1 drink a day for women.  0-2 drinks a day for men. ? Be aware of how much alcohol is in your drink. In the [place] one drink equals one 12 oz bottle of beer (355 mL), one 5 oz glass of wine (148 mL), or one 1 oz glass of hard liquor (44 mL). General instructions  If you have bleeding from the incision area, wear a pad to absorb blood. Change it often.  Keep all follow-up visits as told by your health care provider. This is important. Contact a health care provider if:  You have any of these signs of infection: ? More redness, swelling, or pain around your incision area. ? Warmth coming from your incision area, or your incision area is firm. ? Pus or a bad smell coming from your incision area. ? A fever or chills.  You develop swelling or tenderness in your groin area.  You cannot control your bowel movements (incontinence), or you are leaking stool.  You have trouble urinating.  You have pain that does not get better with medicine. Get help right away if:  You have severe pain in your abdomen or incision area.  You have sudden chest pain.  You become weak or you faint.  You have more fluid or blood coming from your incision.  You have bleeding from your incision that soaks 2 or more pads during 24 hours. Summary  After anal fistulotomy, it is common to have increased pain during bowel movements and some bleeding.  If a dressing was placed in your incision during surgery, remove it as told by your health care provider.  It is important to keep the incision area clean and dry after each bowel movement.  If you have bleeding from the incision area, wear a pad to absorb blood. Change it often. This information is not intended to replace advice given to you by your health care provider. Make sure you discuss any questions you have with your health care provider. Document Revised: 04/20/2019 Document Reviewed: 04/20/2019 Elsevier Patient Education  2021 [employer].   How to Take a [employer] A sitz bath is a warm water bath that may be used to care for your rectum, genital area, or the area between your rectum and genitals (perineum). In a sitz bath, the water only comes up to your hips and covers your buttocks. A sitz bath may be done in a bathtub or with a portable sitz bath that fits over the toilet. Your health care provider may recommend a sitz bath to help:  Relieve pain and discomfort after delivering a baby.  Relieve pain and itching from hemorrhoids or anal fissures.  Relieve pain after certain surgeries.  Relax muscles that are sore or tight. How to take a sitz bath Take 3-4 sitz baths a day, or as many as told by your health care provider. Bathtub sitz  bath To take a sitz bath in a bathtub: 1. Partially fill a bathtub with warm water. The water should be deep enough to cover your hips and buttocks when you are sitting in the tub. 2. Follow your health care provider's instructions if you are told to put medicine in the water. 3. Sit in the water. Open the tub drain a little, and leave it open during your bath. 4. Turn on the warm water again, enough to replace the water that is draining out. Keep the water running throughout your bath. This helps keep the water at the right level and temperature. 5. Soak in the water for 15-20 minutes, or as long as told by your health care provider. 6. When you are done, be careful when you stand up. You may feel dizzy. 7. After the sitz bath, pat yourself dry. Do not rub your skin to dry it.   Over-the-toilet sitz bath To take a sitz bath with an over-the-toilet basin: 1. Follow the manufacturer's instructions. 2. Fill the basin with warm water. 3. Follow your health care provider's instructions if you were told to put medicine in the water. 4. Sit on the seat. Make sure the water covers your buttocks and perineum. 5. Soak in the water for 15-20 minutes, or as long as told by your health care  provider. 6. After the sitz bath, pat yourself dry. Do not rub your skin to dry it. 7. Clean and dry the basin between uses. 8. Discard the basin if it cracks, or according to the manufacturer's instructions.   Contact a health care provider if:  Your pain or itching gets worse. Do not continue with sitz baths if your symptoms get worse.  You have new symptoms. Do not continue with sitz baths until you talk with your health care provider. Summary  A sitz bath is a warm water bath in which the water only comes up to your hips and covers your buttocks.  A sitz bath may help relieve pain and discomfort after delivering a baby. It also may help with pain and itching from hemorrhoids or anal fissures, or pain after certain surgeries. It can also help to relax muscles that are sore or tight.  Take 3-4 sitz baths a day, or as many as told by your health care provider. Soak in the water for 15-20 minutes.  Do not continue with sitz baths if your symptoms get worse. This information is not intended to replace advice given to you by your health care provider. Make sure you discuss any questions you have with your health care provider. Document Revised: 07/21/2020 Document Reviewed: 07/21/2020 Elsevier Patient Education  2021 [employer].

## 2021-04-03 NOTE — Progress Notes (Signed)
PROGRESS NOTE    [name]  SAY:[id number] DOB: 1946-01-17 DOA: 03/31/2021 PCP: [facility]    No chief complaint on file.   Brief Narrative:  History of hypertension, diabetes, CVA in 2015,CKD 3A, anemia of chronic disease, gout, OSA, prostate cancer status post seeds placed in December, previous perirectal abscess without fistula, with c/o 5 day h/o rectal pain, associated scrotal pain and swelling , diarrhea ,was at urologist office who noted fever 101.4, sent to ED  WBC 16.1k. Creat 2.5 up from 1.6 baseline, blood glucose 55, Lactate nl, no hypotension CT A/P: soft tissue gas involving L and midline of perineum extending to posterior base of penis. No definate connection to anorectum. Concern for necrotizing fasciitis   Assessment & Plan:   Principal Problem:   Perirectal abscess Active Problems:   Sepsis with acute renal failure without septic shock (HCC)   Fournier's gangrene   DM (diabetes mellitus) (HCC)   CKD (chronic kidney disease)   Anemia   HTN (hypertension)   Malignant neoplasm of prostate (HCC)   Cellulitis of perineum   AKI (acute kidney injury) ([place])   Renal failure (ARF), acute on chronic (HCC)  1 perirectal abscess/necrotizing fasciitis/sepsis, POA -Patient presented with fever, leukocytosis, tachypnea, acute kidney injury noted to have a perirectal abscess. -Patient status post incision and drainage of perirectal abscess with anal fistula and placement of seton per Dr. Marlou Starks 04/01/2021. -MRSA PCR negative. -Blood cultures with no growth to date. -Urine cultures negative. -Wound cultures with few gram-negative rods, few gram-positive cocci, susceptibilities pending. -Continue empiric IV Zosyn, IV clindamycin. -Consulted ID for antibiotic recommendations and duration. -Per general surgery and urology.  2. ??  Postop delirium-resolved -Patient noted to have an episode of confusion of the night of 04/01/2021 which has since  resolved.  3.  Insulin-dependent type 2 diabetes mellitus presented with hypoglycemia/history of peripheral neuropathy -Patient noted on admission to have a blood glucose level of 55 initially on presentation received D10 in the ED and briefly on D5 half-normal saline. -Hemoglobin A1c 6.5 (04/01/2021) -CBG 193 this morning. -Continue current dose of Lantus, SSI  4.  Acute kidney injury on chronic kidney disease stage IIIa -Creatinine noted to be 2.58 on presentation. -Baseline creatinine approximately 1.6. -Likely secondary to prerenal azotemia in the setting of ACE inhibitor. -Urinalysis nitrite negative, leukocytes negative, negative protein. -Renal function improving with gentle hydration creatinine currently at 1.75. -Continue gentle hydration for another 24 hours. -Continue to hold ACE inhibitor.  5.  Anemia -Likely anemia of chronic disease. -Patient with no overt bleeding. -Check an anemia panel. -Transfusion threshold hemoglobin < 7.  6.  Hypertension -Coreg held this morning as heart rate in the 60s. -Continue to hold ACE inhibitor. -Place back on half home dose of Norvasc at 5 mg daily.  7.  History of prostate cancer status post seeds -Per urology.  8.  Recent COVID-19 infection -Patient noted to have tested positive with COVID 2 weeks prior to admission currently asymptomatic. -Repeat testing on admission remains positive. -Patient not hypoxic. -Chest x-ray with no acute findings. -Currently not requiring airborne precautions.  DVT prophylaxis: SCDs Code Status: Full Family Communication: Updated patient.  No family at bedside. Disposition:   Status is: Inpatient    Dispo: The patient is from: Home              Anticipated d/c is to: TBD              Patient currently on IV antibiotics,  being treated for perirectal abscess, not stable for discharge.   Difficult to place patient no       Consultants:   Urology: Dr. Lovena Neighbours 04/01/2021  General  surgery: Dr. Marlou Starks 04/01/2021  Procedures:  CT abdomen and pelvis 03/31/2021  Chest x-ray 03/31/2021  Incision and drainage perirectal abscess with anal fistula, placement of seton per Dr. Marlou Starks 04/01/2021  Antimicrobials:   IV Zosyn 03/31/2021>>>>  IV clindamycin 03/31/2021>>>>  IV vancomycin 5/13/ 2022x1 dose   Subjective: Patient sitting up eating breakfast.  Denies any chest pain or shortness of breath.  No abdominal pain.  Still with some perirectal pain currently controlled on current pain regimen.  Asking when he is going to be able to go home.  Objective: Vitals:   04/03/21 0400 04/03/21 0600 04/03/21 0800 04/03/21 0841  BP: (!) 140/55 (!) 146/51 (!) 142/58   Pulse: 63 62 61   Resp: 18 13 11    Temp:    98.1 F (36.7 C)  TempSrc:    Oral  SpO2: 99% 96% 97%   Weight:      Height:        Intake/Output Summary (Last 24 hours) at 04/03/2021 1006 Last data filed at 04/03/2021 0853 Gross per 24 hour  Intake 1333.78 ml  Output 800 ml  Net 533.78 ml   Filed Weights   03/31/21 1521 04/01/21 0500  Weight: 100.7 kg 103.9 kg    Examination:  General exam: Appears calm and comfortable  Respiratory system: Clear to auscultation. Respiratory effort normal. Cardiovascular system: S1 & S2 heard, RRR. No JVD, murmurs, rubs, gallops or clicks. No pedal edema. Gastrointestinal system: Abdomen is nondistended, soft and nontender. No organomegaly or masses felt. Normal bowel sounds heard. Central nervous system: Alert and oriented. No focal neurological deficits. Extremities: Symmetric 5 x 5 power. Skin: No rashes, lesions or ulcers Psychiatry: Judgement and insight appear normal. Mood & affect appropriate.     Data Reviewed: I have personally reviewed following labs and imaging studies  CBC: Recent Labs  Lab 03/31/21 1800 04/01/21 0706 04/02/21 0447 04/03/21 0840  WBC 16.1* 11.4* 11.7* 8.5  NEUTROABS 12.7*  --  10.1* 5.6  HGB 9.8* 8.3* 7.7* 7.9*  HCT 30.6* 26.0* 24.3*  24.8*  MCV 86.7 85.2 86.2 87.6  PLT 242 266 279 683    Basic Metabolic Panel: Recent Labs  Lab 03/31/21 1800 04/01/21 0706 04/02/21 0447 04/03/21 0428 04/03/21 0840  NA 136 133* 137 138  --   K 4.7 4.1 4.4 4.3  --   CL 102 103 107 107  --   CO2 23 22 21* 24  --   GLUCOSE 77 133* 251* 206*  --   BUN 33* 33* 35* 36*  --   CREATININE 2.58* 2.04* 2.02* 1.75*  --   CALCIUM 9.1 8.3* 8.7* 8.5*  --   MG  --   --   --   --  2.4    GFR: Estimated Creatinine Clearance: 44.1 mL/min (A) (by C-G formula based on SCr of 1.75 mg/dL (H)).  Liver Function Tests: Recent Labs  Lab 03/31/21 1800 04/01/21 0706  AST 33 23  ALT 26 20  ALKPHOS 134* 104  BILITOT 0.5 0.6  PROT 8.4* 6.6  ALBUMIN 3.7 2.7*    CBG: Recent Labs  Lab 04/02/21 1538 04/02/21 2007 04/03/21 0012 04/03/21 0415 04/03/21 0743  GLUCAP 253* 200* 205* 193* 179*     Recent Results (from the past 240 hour(s))  Blood culture (routine single)  Status: None (Preliminary result)   Collection Time: 03/31/21  6:00 PM   Specimen: BLOOD  Result Value Ref Range Status   Specimen Description   Final    BLOOD BLOOD LEFT HAND Performed at [facility] [address] [place], [place] [address]    Special Requests   Final    BOTTLES DRAWN AEROBIC AND ANAEROBIC Blood Culture adequate volume Performed at [facility] [address], [place], [place] [address]    Culture   Final    NO GROWTH 2 DAYS Performed at [facility] [address], [place], [place] [address]    Report Status PENDING  Incomplete  Resp Panel by RT-PCR (Flu A&B, Covid) Nasopharyngeal Swab     Status: Abnormal   Collection Time: 03/31/21  7:52 PM   Specimen: Nasopharyngeal Swab; Nasopharyngeal(NP) swabs in vial transport medium  Result Value Ref Range Status   SARS Coronavirus 2 by RT PCR POSITIVE (A) NEGATIVE Final    Comment: RESULT CALLED TO, READ BACK BY AND VERIFIED WITH: BANNO,A 03/31/21  @2147  BY SEELMOL (NOTE) SARS-CoV-2 target nucleic acids are DETECTED.  The SARS-CoV-2 RNA is generally detectable in upper respiratory specimens during the acute phase of infection. Positive results are indicative of the presence of the identified virus, but do not rule out bacterial infection or co-infection with other pathogens not detected by the test. Clinical correlation with patient history and other diagnostic information is necessary to determine patient infection status. The expected result is Negative.  Fact Sheet for Patients: EntrepreneurPulse.com.au  Fact Sheet for Healthcare Providers: IncredibleEmployment.be  This test is not yet approved or cleared by the [place] FDA and  has been authorized for detection and/or diagnosis of SARS-CoV-2 by FDA under an Emergency Use Authorization (EUA).  This EUA will remain in effect (meaning this test can be  used) for the duration of  the COVID-19 declaration under Section 564(b)(1) of the Act, 21 U.S.C. section 360bbb-3(b)(1), unless the authorization is terminated or revoked sooner.     Influenza A by PCR NEGATIVE NEGATIVE Final   Influenza B by PCR NEGATIVE NEGATIVE Final    Comment: (NOTE) The Xpert Xpress SARS-CoV-2/FLU/RSV plus assay is intended as an aid in the diagnosis of influenza from Nasopharyngeal swab specimens and should not be used as a sole basis for treatment. Nasal washings and aspirates are unacceptable for Xpert Xpress SARS-CoV-2/FLU/RSV testing.  Fact Sheet for Patients: EntrepreneurPulse.com.au  Fact Sheet for Healthcare Providers: IncredibleEmployment.be  This test is not yet approved or cleared by the [place] FDA and has been authorized for detection and/or diagnosis of SARS-CoV-2 by FDA under an Emergency Use Authorization (EUA). This EUA will remain in effect (meaning this test can be used) for the duration of  the COVID-19 declaration under Section 564(b)(1) of the Act, 21 U.S.C. section 360bbb-3(b)(1), unless the authorization is terminated or revoked.  Performed at [facility], [place] [address], [place], [place] [address]   MRSA PCR Screening     Status: None   Collection Time: 04/01/21  3:45 AM   Specimen: Nasal Mucosa; Nasopharyngeal  Result Value Ref Range Status   MRSA by PCR NEGATIVE NEGATIVE Final    Comment:        The GeneXpert MRSA Assay (FDA approved for NASAL specimens only), is one component of a comprehensive MRSA colonization surveillance program. It is not intended to diagnose MRSA infection nor to guide or monitor treatment for MRSA infections. Performed at  [facility], [place] [address], [place], [place] [address]   Culture, blood (Routine X 2) w Reflex to ID Panel     Status: None (Preliminary result)   Collection Time: 04/01/21  7:06 AM   Specimen: BLOOD  Result Value Ref Range Status   Specimen Description   Final    BLOOD LEFT HAND Performed at [facility] [address], [place], [place] [address]    Special Requests   Final    BOTTLES DRAWN AEROBIC ONLY Blood Culture adequate volume Performed at [facility] [address], [place], [place] [address]    Culture   Final    NO GROWTH < 24 HOURS Performed at [facility] [address] [place], [place] [address]    Report Status PENDING  Incomplete  Aerobic/Anaerobic Culture w Gram Stain (surgical/deep wound)     Status: None (Preliminary result)   Collection Time: 04/01/21 10:29 AM   Specimen: PATH Other; Tissue  Result Value Ref Range Status   Specimen Description   Final    PERINEAL ABCESS Performed at [facility] [address] [place], [place] [address]    Special Requests   Final    NONE Performed at [facility], [place] [address], [place], [place] [address]    Gram  Stain   Final    MODERATE WBC PRESENT, PREDOMINANTLY PMN FEW GRAM POSITIVE COCCI FEW GRAM NEGATIVE RODS    Culture   Final    FEW GRAM NEGATIVE RODS IDENTIFICATION AND SUSCEPTIBILITIES TO FOLLOW Performed at [facility], [place] [address], [place], [place] [address]    Report Status PENDING  Incomplete  Culture, Urine     Status: None   Collection Time: 04/01/21  4:03 PM   Specimen: Urine, Clean Catch  Result Value Ref Range Status   Specimen Description   Final    URINE, CLEAN CATCH Performed at [facility], [place] [address], [place], [place] [address]    Special Requests   Final    NONE Performed at [facility], [place] [address] [place], [place] [address]    Culture   Final    NO GROWTH Performed at [facility], [place] [address] [place], [place] [address]    Report Status 04/02/2021 FINAL  Final         Radiology Studies: No results found.      Scheduled Meds: . (feeding supplement) PROSource Plus  30 mL Oral TID BM  . amitriptyline  50 mg Oral QHS  . amLODipine  5 mg Oral Daily  . atorvastatin  20 mg Oral QHS  . buPROPion  150 mg Oral Daily  . carvedilol  3.125 mg Oral BID WC  . Chlorhexidine Gluconate Cloth  6 each Topical Daily  . docusate sodium  100 mg Oral BID  . febuxostat  40 mg Oral QHS  . feeding supplement  1 Container Oral TID BM  . ferrous sulfate  325 mg Oral Q breakfast  . gabapentin  600 mg Oral QHS  . insulin aspart  0-9 Units Subcutaneous Q4H  . insulin glargine  20 Units Subcutaneous QHS  . mouth rinse  15 mL Mouth Rinse BID  . multivitamin with minerals  1 tablet Oral Daily  . sodium chloride flush  10-40 mL Intracatheter Q12H  . tetrahydrozoline  3 drop Both Eyes Daily   Continuous Infusions: . sodium chloride 75 mL/hr  at 04/03/21 0842  . clindamycin (CLEOCIN) IV Stopped (04/03/21 [id number])  . piperacillin-tazobactam (ZOSYN)  IV 3.375 g (04/03/21 0845)     LOS: 3 days    Time spent:  35 minutes    Irine Seal, MD [facility]   To contact the attending provider between 7A-7P or the covering provider during after hours 7P-7A, please log into the web site [email or web address] and access using universal [place] password for that web site. If you do not have the password, please call the hospital operator.  04/03/2021, 10:06 AM

## 2021-04-03 NOTE — Progress Notes (Signed)
Pt having frequent BMs. Loose, but not watery. Stool softener was not given. Pt reports "cannot control stool when I bear down to urinate". MD aware.

## 2021-04-03 NOTE — Progress Notes (Addendum)
Progress Note  2 Days Post-Op  Subjective: Patient reports mild pain in perirectal area. Has not had a BM yet. Per RN outer dressing was changed but packing is still present. Discussed importance of sitz baths with patient and RN today, explained that seton to remain in place.   Objective: Vital signs in last 24 hours: Temp:  [97.5 F (36.4 C)-98.4 F (36.9 C)] 98.1 F (36.7 C) (05/16 0841) Pulse Rate:  [61-70] 61 (05/16 0800) Resp:  [11-22] 11 (05/16 0800) BP: (118-152)/(43-88) 142/58 (05/16 0800) SpO2:  [94 %-100 %] 97 % (05/16 0800) Last BM Date: 03/31/21  Intake/Output from previous day: 05/15 0701 - 05/16 0700 In: 1619.3 [P.O.:640; I.V.:688.1; IV Piggyback:291.2] Out: 800 [Urine:800] Intake/Output this shift: Total I/O In: 218.9 [I.V.:20; IV Piggyback:198.9] Out: -   PE: General: pleasant, WD, overweight male who is laying in bed in NAD Heart: regular, rate, and rhythm.   Lungs: Respiratory effort nonlabored Abd: soft, NT, ND GU: seton in place, packing removed from L buttock with minimal purulent drainage, no significant cellulitis or induration present     Lab Results:  Recent Labs    04/02/21 0447 04/03/21 0840  WBC 11.7* 8.5  HGB 7.7* 7.9*  HCT 24.3* 24.8*  PLT 279 287   BMET Recent Labs    04/02/21 0447 04/03/21 0428  NA 137 138  K 4.4 4.3  CL 107 107  CO2 21* 24  GLUCOSE 251* 206*  BUN 35* 36*  CREATININE 2.02* 1.75*  CALCIUM 8.7* 8.5*   PT/INR Recent Labs    03/31/21 1800  LABPROT 14.3  INR 1.1   CMP     Component Value Date/Time   NA 138 04/03/2021 0428   NA 147 (H) 08/21/2013 1032   K 4.3 04/03/2021 0428   K 3.6 08/21/2013 1032   CL 107 04/03/2021 0428   CL 112 (H) 08/21/2012 1132   CO2 24 04/03/2021 0428   CO2 21 (L) 08/21/2013 1032   GLUCOSE 206 (H) 04/03/2021 0428   GLUCOSE 72 08/21/2013 1032   GLUCOSE 107 (H) 08/21/2012 1132   BUN 36 (H) 04/03/2021 0428   BUN 20.0 08/21/2013 1032   CREATININE 1.75 (H) 04/03/2021  0428   CREATININE 1.8 (H) 08/21/2013 1032   CALCIUM 8.5 (L) 04/03/2021 0428   CALCIUM 9.1 08/21/2013 1032   PROT 6.6 04/01/2021 0706   PROT 6.9 08/21/2013 1032   ALBUMIN 2.7 (L) 04/01/2021 0706   ALBUMIN 3.6 08/21/2013 1032   AST 23 04/01/2021 0706   AST 22 08/21/2013 1032   ALT 20 04/01/2021 0706   ALT 26 08/21/2013 1032   ALKPHOS 104 04/01/2021 0706   ALKPHOS 58 08/21/2013 1032   BILITOT 0.6 04/01/2021 0706   BILITOT 0.22 08/21/2013 1032   GFRNONAA 40 (L) 04/03/2021 0428   GFRAA 54 (L) 01/23/2017 0430   Lipase  No results found for: LIPASE     Studies/Results: No results found.  Anti-infectives: Anti-infectives (From admission, onward)   Start     Dose/Rate Route Frequency Ordered Stop   04/02/21 2300  vancomycin (VANCOREADY) IVPB 1250 mg/250 mL  Status:  Discontinued        1,250 mg 166.7 mL/hr over 90 Minutes Intravenous Every 48 hours 03/31/21 2244 04/02/21 1045   04/01/21 0800  piperacillin-tazobactam (ZOSYN) IVPB 3.375 g        3.375 g 12.5 mL/hr over 240 Minutes Intravenous Every 8 hours 03/31/21 2243     03/31/21 2245  clindamycin (CLEOCIN) IVPB 600 mg  600 mg 100 mL/hr over 30 Minutes Intravenous Every 8 hours 03/31/21 2231     03/31/21 2245  vancomycin (VANCOREADY) IVPB 2000 mg/400 mL        2,000 mg 200 mL/hr over 120 Minutes Intravenous STAT 03/31/21 2236 04/01/21 0747   03/31/21 2115  piperacillin-tazobactam (ZOSYN) IVPB 3.375 g        3.375 g 100 mL/hr over 30 Minutes Intravenous  Once 03/31/21 2113 03/31/21 2321   03/31/21 2100  piperacillin-tazobactam (ZOSYN) IVPB 3.375 g  Status:  Discontinued        3.375 g 100 mL/hr over 30 Minutes Intravenous  Once 03/31/21 2056 03/31/21 2058   03/31/21 2100  piperacillin-tazobactam (ZOSYN) IVPB 4.5 g  Status:  Discontinued        4.5 g 200 mL/hr over 30 Minutes Intravenous  Once 03/31/21 2058 03/31/21 2112       Assessment/Plan IDDM AKI on CKD stage III HTN Prostate cancer s/p seeds Recent COVID  infection 2+ weeks ago with resolution in symptoms   Perirectal abscess and rectal ulceration  S/P EUA, I&D, placement of seton 04/01/21 Dr. Marlou Starks  - POD#2 - seton in place and packing removed - sitz baths 3-4x daily or more often if needed with bowel function  - do not re-pack incision  - cx with few G- rods, WBC down to 8.5, pt on zosyn and clinda - await cx results and continue IV abx for now   FEN: CM diet, IVF VTE: SCDs, ok to have chemical prophylaxis from a surgical standpoint  ID: vanc 5/13>5/15; Zosyn 5/13>>, clinda 5/13>>  LOS: 3 days    Norm Parcel, [facility] Surgery 04/03/2021, 9:31 AM Please see Amion for pager number during day hours 7:00am-4:30pm

## 2021-04-03 NOTE — Consult Note (Signed)
[facility] for Infectious Disease       Reason for Consult: peri rectal abscess    Referring Physician: Dr. Grandville Silos  Principal Problem:   Perirectal abscess Active Problems:   DM (diabetes mellitus) ([place])   CKD (chronic kidney disease)   Anemia   HTN (hypertension)   Malignant neoplasm of prostate (HCC)   Fournier's gangrene   Cellulitis of perineum   AKI (acute kidney injury) ([place])   Sepsis with acute renal failure without septic shock (HCC)   Renal failure (ARF), acute on chronic ([place])   . (feeding supplement) PROSource Plus  30 mL Oral TID BM  . amitriptyline  50 mg Oral QHS  . amLODipine  5 mg Oral Daily  . atorvastatin  20 mg Oral QHS  . buPROPion  150 mg Oral Daily  . carvedilol  3.125 mg Oral BID WC  . Chlorhexidine Gluconate Cloth  6 each Topical Daily  . febuxostat  40 mg Oral QHS  . feeding supplement  1 Container Oral TID BM  . ferrous sulfate  325 mg Oral Q breakfast  . gabapentin  600 mg Oral QHS  . insulin aspart  0-9 Units Subcutaneous Q4H  . insulin glargine  20 Units Subcutaneous QHS  . mouth rinse  15 mL Mouth Rinse BID  . multivitamin with minerals  1 tablet Oral Daily  . polycarbophil  625 mg Oral BID  . sodium chloride flush  10-40 mL Intracatheter Q12H  . tetrahydrozoline  3 drop Both Eyes Daily    Recommendations: Stop clindamycin Continue with piptazo Can transition to oral Augmentin at discharge   Assessment: He has a perirectal abscess with recent brachytherapy and culture with pansensitive E coli, now s/p drainage.     Antibiotics: Vancomycin, piperacillin/tazobactam, clindamycin  HPI: [name] is a 75 y.o. male with grade 2 prostate cancer s/p brachytherapy done in December 2021 presented with a perirectal abscess.  Debridement done by Dr. Marlou Starks on 5/14 and found an abscess space left of the rectum and cultures grew as above.  No necrotizing tissue reported.  Started on broad spectrum antibiotics.  WBC 16.1 on admission,  Tmax 100.6.    Review of Systems:  Constitutional: negative for fevers and chills Gastrointestinal: positive for loose stools Integument/breast: negative for rash All other systems reviewed and are negative    Past Medical History:  Diagnosis Date  . Abrasion of face    11-15-2020  per pt stated has small area under one ear,  no drainage , no dressing, putting neosporin on it  . Anemia associated with chronic renal failure   . Anxiety   . Arthritis   . Chronic constipation   . Chronic gout   . Chronic gout    per pt last episode approx. 2005  ([place] )  . Chronic pruritic rash in adult    intermittant on back  . CKD (chronic kidney disease), stage III ([place])    followed by nephrologist @ [place]  . Diverticulosis of colon   . DOE (dyspnea on exertion)    long distance walking and up flight of stairs but states ok after rest couple minutes  . Dyspnea   . History of agent Orange exposure   . History of basal cell carcinoma excision    back, stomach, face  . 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 History  of DVT of lower extremity   . History of kidney stones   . Hyperplasia of prostate with lower urinary tract symptoms (LUTS)   . Hypertension    follow[place] [facility]cp [facility]   . OSA on CPAP    severe per study in epic 08-31-2015,  states uses nightly  . Peripheral neuropathy   . Prostate cancer [facility]) urologist--- dr Tresa Moore;  oncologist--- dr Tammi Klippel   first dx 02/ 2014 active survillance;  repeat bx 08-29-2020 ,  Stage T1c, Gleason 3+4  . Type 2 diabetes mellitus treated with insulin ([place])    followed by pcp @ [place] [facility]   (11-15-2020  per pt check blood sugar twice daily,  fasting sugar --- 110--120)  . Wears glasses   . Wears hearing aid in both ears    HOH even when he wears aids    Social History   Tobacco Use  . Smoking status: Never  Smoker  . Smokeless tobacco: Never Used  Vaping Use  . Vaping Use: Never used  Substance Use Topics  . Alcohol use: No  . Drug use: Never    Family History  Problem Relation Age of Onset  . Dementia Mother   . Hypertension Mother   . Diabetes Mother   . Heart attack Father 42  . Kidney cancer Brother        tumor removed  . Hypertension Brother   . Liver cancer Maternal Grandfather   . Testicular cancer Brother   . Cancer Paternal Aunt   . Colon cancer Neg Hx   . Colon polyps Neg Hx   . Esophageal cancer Neg Hx   . Stomach cancer Neg Hx   . Rectal cancer Neg Hx     No Known Allergies  Physical Exam: Constitutional: well developed and well nourished  Vitals:   04/03/21 1218 04/03/21 1224  BP:  (!) 161/91  Pulse:  70  Resp:  20  Temp: 97.6 F (36.4 C)   SpO2:  96%   EYES: anicteric Cardiovascular: Cor RRR Respiratory: clear; Musculoskeletal: no pedal edema noted Skin: negatives: no rash Neuro: non-focal  Lab Results  Component Value Date   WBC 8.5 04/03/2021   HGB 7.9 (L) 04/03/2021   HCT 24.8 (L) 04/03/2021   MCV 87.6 04/03/2021   PLT 287 04/03/2021    Lab Results  Component Value Date   CREATININE 1.75 (H) 04/03/2021   BUN 36 (H) 04/03/2021   NA 138 04/03/2021   K 4.3 04/03/2021   CL 107 04/03/2021   CO2 24 04/03/2021    Lab Results  Component Value Date   ALT 20 04/01/2021   AST 23 04/01/2021   ALKPHOS 104 04/01/2021     Microbiology: Recent Results (from the past 240 hour(s))  Blood culture (routine single)     Status: None (Preliminary result)   Collection Time: 03/31/21  6:00 PM   Specimen: BLOOD  Result Value Ref Range Status   Specimen Description   Final    BLOOD BLOOD LEFT HAND Performed at [facility], [place] [address] [place], [place] [address]    Special Requests   Final    BOTTLES DRAWN AEROBIC AND ANAEROBIC Blood Culture adequate volume Performed at [place] [address], [place], [place] [address]    Culture   Final    NO GROWTH 2 DAYS Performed at [place] [address] [place], [place] [address]    Report Status PENDING  Incomplete  Resp Panel by RT-PCR (Flu A&B, Covid) Nasopharyngeal Swab     Status: Abnormal   Collection Time: 03/31/21  7:52 PM   Specimen: Nasopharyngeal Swab; Nasopharyngeal(NP) swabs in vial transport medium  Result Value Ref Range Status   SARS Coronavirus 2 by RT PCR POSITIVE (A) NEGATIVE Final    Comment: RESULT CALLED TO, READ BACK BY AND VERIFIED WITH: BANNO,A 03/31/21 @2147  BY SEELMOL (NOTE) SARS-CoV-2 target nucleic acids are DETECTED.  The SARS-CoV-2 RNA is generally detectable in upper respiratory specimens during the acute phase of infection. Positive results are indicative of the presence of the identified virus, but do not rule out bacterial infection or co-infection with other pathogens not detected by the test. Clinical correlation with patient history and other diagnostic information is necessary to determine patient infection status. The expected result is Negative.  Fact Sheet for Patients: EntrepreneurPulse.com.au  Fact Sheet for Healthcare Providers: IncredibleEmployment.be  This test is not yet approved or cleared by the [place] FDA and  has been authorized for detection and/or diagnosis of SARS-CoV-2 by FDA under an Emergency Use Authorization (EUA).  This EUA will remain in effect (meaning this test can be  used) for the duration of  the COVID-19 declaration under Section 564(b)(1) of the Act, 21 U.S.C. section 360bbb-3(b)(1), unless the authorization is terminated or revoked sooner.     Influenza A by PCR NEGATIVE NEGATIVE Final   Influenza B by PCR NEGATIVE NEGATIVE Final    Comment: (NOTE) The Xpert Xpress SARS-CoV-2/FLU/RSV plus assay is intended as an aid in the diagnosis of influenza from Nasopharyngeal swab specimens and should not be  used as a sole basis for treatment. Nasal washings and aspirates are unacceptable for Xpert Xpress SARS-CoV-2/FLU/RSV testing.  Fact Sheet for Patients: EntrepreneurPulse.com.au  Fact Sheet for Healthcare Providers: IncredibleEmployment.be  This test is not yet approved or cleared by the [place] FDA and has been authorized for detection and/or diagnosis of SARS-CoV-2 by FDA under an Emergency Use Authorization (EUA). This EUA will remain in effect (meaning this test can be used) for the duration of the COVID-19 declaration under Section 564(b)(1) of the Act, 21 U.S.C. section 360bbb-3(b)(1), unless the authorization is terminated or revoked.  Performed at [facility], [place] [address] [place], [place] [address]   MRSA PCR Screening     Status: None   Collection Time: 04/01/21  3:45 AM   Specimen: Nasal Mucosa; Nasopharyngeal  Result Value Ref Range Status   MRSA by PCR NEGATIVE NEGATIVE Final    Comment:        The GeneXpert MRSA Assay (FDA approved for NASAL specimens only), is one component of a comprehensive MRSA colonization surveillance program. It is not intended to diagnose MRSA infection nor to guide or monitor treatment for MRSA infections. Performed at [facility], [place] [address], [place], [place] [address]   Culture, blood (Routine X 2) w Reflex to ID Panel     Status: None (Preliminary result)   Collection Time: 04/01/21  7:06 AM   Specimen: BLOOD  Result Value Ref Range Status   Specimen Description   Final    BLOOD LEFT HAND Performed at [place] [address], [place], [place] [address]    Special Requests   Final    BOTTLES DRAWN AEROBIC ONLY Blood Culture adequate volume Performed at [place] [address] [place], [place] [address]    Culture   Final    NO GROWTH <  24 HOURS Performed at [facility], [place]  [address], [place], [place] [address]    Report Status PENDING  Incomplete  Aerobic/Anaerobic Culture w Gram Stain (surgical/deep wound)     Status: None (Preliminary result)   Collection Time: 04/01/21 10:29 AM   Specimen: PATH Other; Tissue  Result Value Ref Range Status   Specimen Description   Final    PERINEAL ABCESS Performed at [place] [address], [place], [place] [address]    Special Requests   Final    NONE Performed at [facility], [place] [address], [place], [place] [address]    Gram Stain   Final    MODERATE WBC PRESENT, PREDOMINANTLY PMN FEW GRAM POSITIVE COCCI FEW GRAM NEGATIVE RODS Performed at [facility], [place] [address], [place], [place] [address]    Culture FEW ESCHERICHIA COLI  Final   Report Status PENDING  Incomplete   Organism ID, Bacteria ESCHERICHIA COLI  Final      Susceptibility   Escherichia coli - MIC*    AMPICILLIN <=2 SENSITIVE Sensitive     CEFAZOLIN <=4 SENSITIVE Sensitive     CEFEPIME <=0.12 SENSITIVE Sensitive     CEFTAZIDIME <=1 SENSITIVE Sensitive     CEFTRIAXONE <=0.25 SENSITIVE Sensitive     CIPROFLOXACIN <=0.25 SENSITIVE Sensitive     GENTAMICIN <=1 SENSITIVE Sensitive     IMIPENEM <=0.25 SENSITIVE Sensitive     TRIMETH/SULFA <=20 SENSITIVE Sensitive     AMPICILLIN/SULBACTAM <=2 SENSITIVE Sensitive     PIP/TAZO <=4 SENSITIVE Sensitive     * FEW ESCHERICHIA COLI  Culture, Urine     Status: None   Collection Time: 04/01/21  4:03 PM   Specimen: Urine, Clean Catch  Result Value Ref Range Status   Specimen Description   Final    URINE, CLEAN CATCH Performed at [facility], [place] [address], [place], [place] [address]    Special Requests   Final    NONE Performed at [facility], [place] [address] [place], [place] [address]    Culture   Final    NO GROWTH Performed at [facility], [place] [address] [place], [place] [address]    Report Status  04/02/2021 FINAL  Final    Thayer Headings, [place] for Infectious Disease [facility] [email or web address] 04/03/2021, 2:06 PM

## 2021-04-03 NOTE — Progress Notes (Signed)
Pharmacy Antibiotic Note  [name] is a 75 y.o. male admitted on 03/31/2021 with Fournier's gangrene.  PMH significant for prostate cancer, recent Covid. Pharmacy has been consulted for Zosyn dosing. 04/03/2021  Afebrile, WBC 8.5, SCr 1.75 S/p I&D 5/14 w/ drain placed; 5/14 Cx: few pan sensitive E coli, GPC not ID'd yet   Plan: Continue Zosyn 3.375gm IV q8h (each dose infused over 4 hours)  Clindamycin per MD- usual duration is 3 days for Fournier's gangrene> rec to DC tonight   Height: 5\' 10"  (177.8 cm) Weight: 103.9 kg (229 lb 0.9 oz) IBW/kg (Calculated) : 73  Temp (24hrs), Avg:97.7 F (36.5 C), Min:97.5 F (36.4 C), Max:98.1 F (36.7 C)  Recent Labs  Lab 03/31/21 1800 03/31/21 2242 04/01/21 0706 04/02/21 0447 04/03/21 0428 04/03/21 0840  WBC 16.1*  --  11.4* 11.7*  --  8.5  CREATININE 2.58*  --  2.04* 2.02* 1.75*  --   LATICACIDVEN 1.4 1.5  --   --   --   --     Estimated Creatinine Clearance: 44.1 mL/min (A) (by C-G formula based on SCr of 1.75 mg/dL (H)).    No Known Allergies  Antimicrobials this admission:  5/13 Clinda >>   5/13 Zosyn >>   5/13 Vanc x 1 dose Dose adjustments this admission:    Microbiology results:  5/13 BCx2: ngtd 5/13 Covid: positive - + 2 weeks ago 5/14 UCx: ngF 5/14 MRSA PCR: neg 5/14 perineal abscess: few GPC, few GNR; few pan sens E coli;    Thank you for allowing pharmacy to be a part of this patient's care.  Eudelia Bunch, Pharm.D 04/03/2021 1:35 PM

## 2021-04-03 NOTE — Progress Notes (Signed)
Attempted to perform sitz bath again. Pt continues continues to c/o inability to tolerate pressure from [facility]. Cleansed incision with approx 200 cc of sterile water by syringe.

## 2021-04-03 NOTE — Progress Notes (Signed)
Attempted to perform sitz bath. Pt unable to tolerate sitting on BSC due to pain. Pt described pain as 7/10 "burning" pain. Cleansed incision with approx 150 cc of sterile water by syringe. Will attempt again later.

## 2021-04-03 NOTE — TOC Initial Note (Signed)
Transition of Care [facility]) - Initial/Assessment Note    Patient Details  Name: [name] MRN: [id number] Date of Birth: [birth year]  [facility] CM/SW Contact:    Leeroy Cha, RN Phone Number: 04/03/2021, 8:02 AM  Clinical Narrative:                 PRE-OPERATIVE DIAGNOSIS:  PERINEAL ABSCESS   POST-OPERATIVE DIAGNOSIS:  PERINEAL ABSCESS, ANAL FISTULA  PROCEDURE:  Procedure(s): EXAM UNDER ANESTHESIA, INCISION AND DRAINAGE PERIRECTAL ABSCESS WITH ANAL FISTULA, PLACEMENT OF SETON PLAN: following for progression and hhc needs  Expected Discharge Plan: Home/Self Care Barriers to Discharge: Continued Medical Work up   Patient Goals and CMS Choice Patient states their goals for this hospitalization and ongoing recovery are:: to go home CMS Medicare.gov Compare Post Acute Care list provided to:: Patient Choice offered to / list presented to : Patient  Expected Discharge Plan and Services Expected Discharge Plan: Home/Self Care   Discharge Planning Services: CM Consult   Living arrangements for the past 2 months: Single Family Home                                      Prior Living Arrangements/Services Living arrangements for the past 2 months: Single Family Home Lives with:: Spouse Patient language and need for interpreter reviewed:: Yes Do you feel safe going back to the place where you live?: Yes      Need for Family Participation in Patient Care: Yes (Comment) (wife) Care giver support system in place?: Yes (comment)   Criminal Activity/Legal Involvement Pertinent to Current Situation/Hospitalization: No - Comment as needed  Activities of Daily Living Home Assistive Devices/Equipment: Eyeglasses,CPAP ADL Screening (condition at time of admission) Patient's cognitive ability adequate to safely complete daily activities?: Yes Is the patient deaf or have difficulty hearing?: Yes Does the patient have difficulty seeing, even when wearing  glasses/contacts?: Yes Does the patient have difficulty concentrating, remembering, or making decisions?: No Patient able to express need for assistance with ADLs?: Yes Does the patient have difficulty dressing or bathing?: No Independently performs ADLs?: No Communication: Independent Dressing (OT): Independent Grooming: Independent Feeding: Independent Bathing: Independent Toileting: Needs assistance Is this a change from baseline?: Pre-admission baseline In/Out Bed: Needs assistance Is this a change from baseline?: Pre-admission baseline Walks in Home: Needs assistance Is this a change from baseline?: Pre-admission baseline Does the patient have difficulty walking or climbing stairs?: Yes Weakness of Legs: Both Weakness of Arms/Hands: Both  Permission Sought/Granted                  Emotional Assessment Appearance:: Appears stated age Attitude/Demeanor/Rapport: Engaged Affect (typically observed): Calm Orientation: : Oriented to Place,Oriented to Self,Oriented to  Time,Oriented to Situation Alcohol / Substance Use: Not Applicable Psych Involvement: No (comment)  Admission diagnosis:  Rectal pain [K62.89] Fournier's gangrene [N49.3] Hypoglycemia [E16.2] AKI (acute kidney injury) ([place]) [N17.9] Patient Active Problem List   Diagnosis Date Noted  . Fournier's gangrene 03/31/2021  . Cellulitis of perineum 03/31/2021  . AKI (acute kidney injury) ([place]) 03/31/2021  . Malignant neoplasm of prostate ([place]) 10/04/2020  . 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 Cryptogenic stroke ([place]) 08/03/2014  . Gout 03/13/2014  . CVA (cerebral infarction) 03/12/2014  . Fever 09/18/2012  . DM (diabetes mellitus) ([place]) 09/18/2012  . CKD (chronic kidney disease) 09/18/2012  . Anemia 09/18/2012  . HTN (hypertension) 09/18/2012  .  Hyperlipidemia 09/18/2012  . Anemia associated with chronic renal failure 10/18/2011   PCP:  Clinic, [place]:   [id number] [address], [place] [place] [address] Phone: [phone] Fax: [phone]     Social Determinants of Health (SDOH) Interventions    Readmission Risk Interventions No flowsheet data found.

## 2021-04-03 NOTE — Progress Notes (Signed)
Pt refused CPAP qhs.  Pt's wife brought in his nasal pillows to use with our machine but when I approached the Pt to put his CPAP on at our agreed upon time, he stated that he did not want to sleep with it tonight.  Pt encouraged to contact RT should he change his mind.  Machine remains in room.

## 2021-04-04 DIAGNOSIS — E11649 Type 2 diabetes mellitus with hypoglycemia without coma: Secondary | ICD-10-CM | POA: Diagnosis not present

## 2021-04-04 DIAGNOSIS — K60329 Anal fistula, complex, unspecified: Secondary | ICD-10-CM

## 2021-04-04 DIAGNOSIS — R197 Diarrhea, unspecified: Secondary | ICD-10-CM

## 2021-04-04 DIAGNOSIS — N179 Acute kidney failure, unspecified: Secondary | ICD-10-CM | POA: Diagnosis not present

## 2021-04-04 DIAGNOSIS — K603 Anal fistula: Secondary | ICD-10-CM

## 2021-04-04 DIAGNOSIS — N493 Fournier gangrene: Secondary | ICD-10-CM | POA: Diagnosis not present

## 2021-04-04 DIAGNOSIS — N1831 Chronic kidney disease, stage 3a: Secondary | ICD-10-CM | POA: Diagnosis not present

## 2021-04-04 LAB — BASIC METABOLIC PANEL
Anion gap: 8 (ref 5–15)
BUN: 26 mg/dL — ABNORMAL HIGH (ref 8–23)
CO2: 25 mmol/L (ref 22–32)
Calcium: 8.4 mg/dL — ABNORMAL LOW (ref 8.9–10.3)
Chloride: 107 mmol/L (ref 98–111)
Creatinine, Ser: 1.4 mg/dL — ABNORMAL HIGH (ref 0.61–1.24)
GFR, Estimated: 52 mL/min — ABNORMAL LOW (ref >60)
Glucose, Bld: 118 mg/dL — ABNORMAL HIGH (ref 70–99)
Potassium: 4.1 mmol/L (ref 3.5–5.1)
Sodium: 140 mmol/L (ref 135–145)

## 2021-04-04 LAB — CBC WITH DIFFERENTIAL/PLATELET
Abs Immature Granulocytes: 0.26 10*3/uL — ABNORMAL HIGH (ref 0.00–0.07)
Basophils Absolute: 0 10*3/uL (ref 0.0–0.1)
Basophils Relative: 1 %
Eosinophils Absolute: 0.3 10*3/uL (ref 0.0–0.5)
Eosinophils Relative: 5 %
HCT: 27 % — ABNORMAL LOW (ref 39.0–52.0)
Hemoglobin: 8.5 g/dL — ABNORMAL LOW (ref 13.0–17.0)
Immature Granulocytes: 4 %
Lymphocytes Relative: 18 %
Lymphs Abs: 1.3 10*3/uL (ref 0.7–4.0)
MCH: 27.6 pg (ref 26.0–34.0)
MCHC: 31.5 g/dL (ref 30.0–36.0)
MCV: 87.7 fL (ref 80.0–100.0)
Monocytes Absolute: 0.7 10*3/uL (ref 0.1–1.0)
Monocytes Relative: 9 %
Neutro Abs: 4.6 10*3/uL (ref 1.7–7.7)
Neutrophils Relative %: 63 %
Platelets: 305 10*3/uL (ref 150–400)
RBC: 3.08 MIL/uL — ABNORMAL LOW (ref 4.22–5.81)
RDW: 13.3 % (ref 11.5–15.5)
WBC: 7.2 10*3/uL (ref 4.0–10.5)
nRBC: 0 % (ref 0.0–0.2)

## 2021-04-04 LAB — AEROBIC/ANAEROBIC CULTURE W GRAM STAIN (SURGICAL/DEEP WOUND)

## 2021-04-04 LAB — GLUCOSE, CAPILLARY
Glucose-Capillary: 107 mg/dL — ABNORMAL HIGH (ref 70–99)
Glucose-Capillary: 111 mg/dL — ABNORMAL HIGH (ref 70–99)
Glucose-Capillary: 122 mg/dL — ABNORMAL HIGH (ref 70–99)
Glucose-Capillary: 128 mg/dL — ABNORMAL HIGH (ref 70–99)
Glucose-Capillary: 133 mg/dL — ABNORMAL HIGH (ref 70–99)
Glucose-Capillary: 173 mg/dL — ABNORMAL HIGH (ref 70–99)

## 2021-04-04 LAB — IRON AND TIBC
Iron: 22 ug/dL — ABNORMAL LOW (ref 45–182)
Saturation Ratios: 12 % — ABNORMAL LOW (ref 17.9–39.5)
TIBC: 180 ug/dL — ABNORMAL LOW (ref 250–450)
UIBC: 158 ug/dL

## 2021-04-04 LAB — FERRITIN: Ferritin: 523 ng/mL — ABNORMAL HIGH (ref 24–336)

## 2021-04-04 LAB — FOLATE: Folate: 7.8 ng/mL (ref >5.9)

## 2021-04-04 LAB — VITAMIN B12: Vitamin B-12: 661 pg/mL (ref 180–914)

## 2021-04-04 MED ORDER — CALCIUM POLYCARBOPHIL 625 MG PO TABS
625.0000 mg | ORAL_TABLET | Freq: Every day | ORAL | Status: DC
  Administered 2021-04-04: 625 mg via ORAL
  Filled 2021-04-04: qty 1

## 2021-04-04 MED ORDER — AMLODIPINE BESYLATE 10 MG PO TABS
10.0000 mg | ORAL_TABLET | Freq: Every day | ORAL | Status: DC
  Administered 2021-04-04 – 2021-04-11 (×8): 10 mg via ORAL
  Filled 2021-04-04 (×8): qty 1

## 2021-04-04 MED ORDER — VITAMIN B-12 1000 MCG PO TABS
1000.0000 ug | ORAL_TABLET | Freq: Every day | ORAL | Status: DC
  Administered 2021-04-04 – 2021-04-11 (×8): 1000 ug via ORAL
  Filled 2021-04-04 (×8): qty 1

## 2021-04-04 MED ORDER — INSULIN GLARGINE 100 UNIT/ML ~~LOC~~ SOLN
15.0000 [IU] | Freq: Every day | SUBCUTANEOUS | Status: DC
  Administered 2021-04-04 – 2021-04-10 (×7): 15 [IU] via SUBCUTANEOUS
  Filled 2021-04-04 (×8): qty 0.15

## 2021-04-04 MED ORDER — CALCIUM POLYCARBOPHIL 625 MG PO TABS
625.0000 mg | ORAL_TABLET | Freq: Two times a day (BID) | ORAL | Status: DC
  Administered 2021-04-04 – 2021-04-11 (×15): 625 mg via ORAL
  Filled 2021-04-04 (×16): qty 1

## 2021-04-04 MED ORDER — HYDROCODONE-ACETAMINOPHEN 5-325 MG PO TABS: 1.0000 | ORAL_TABLET | Freq: Four times a day (QID) | ORAL | 0 refills | Status: DC | PRN

## 2021-04-04 MED ORDER — ACETAMINOPHEN 500 MG PO TABS
500.0000 mg | ORAL_TABLET | Freq: Four times a day (QID) | ORAL | Status: DC
  Administered 2021-04-04 – 2021-04-07 (×10): 500 mg via ORAL
  Filled 2021-04-04 (×9): qty 1

## 2021-04-04 MED ORDER — TAMSULOSIN HCL 0.4 MG PO CAPS
0.4000 mg | ORAL_CAPSULE | Freq: Every day | ORAL | Status: DC
  Administered 2021-04-04 – 2021-04-10 (×7): 0.4 mg via ORAL
  Filled 2021-04-04 (×7): qty 1

## 2021-04-04 MED ORDER — CALCIUM POLYCARBOPHIL 625 MG PO TABS: 625.0000 mg | ORAL_TABLET | Freq: Every day | ORAL | 0 refills | Status: AC

## 2021-04-04 MED ORDER — DIPHENOXYLATE-ATROPINE 2.5-0.025 MG PO TABS
1.0000 | ORAL_TABLET | Freq: Two times a day (BID) | ORAL | Status: DC | PRN
  Administered 2021-04-04 – 2021-04-05 (×2): 1 via ORAL
  Filled 2021-04-04 (×2): qty 1

## 2021-04-04 NOTE — Progress Notes (Addendum)
Progress Note  3 Days Post-Op  Subjective: Patient reports some incontinence of stool with urination. Stool is loose. Patient has some pain with BMs and having a hard time with sitz baths here from discomfort. We discussed that sitz baths at home tend to be easier or showering might be better for him.   Objective: Vital signs in last 24 hours: Temp:  [89.2 F (31.8 C)-98.2 F (36.8 C)] 98.2 F (36.8 C) (05/17 0800) Pulse Rate:  [68-73] 73 (05/17 0800) Resp:  [14-21] 17 (05/17 0800) BP: (142-189)/(35-91) 142/35 (05/17 0800) SpO2:  [93 %-100 %] 97 % (05/17 0800) Last BM Date: 03/31/21  Intake/Output from previous day: 05/16 0701 - 05/17 0700 In: 2020.7 [P.O.:500; I.V.:1172.1; IV Piggyback:348.6] Out: 1750 [Urine:1750] Intake/Output this shift: No intake/output data recorded.  PE: General: pleasant, WD, overweight male who is laying in bed in NAD Heart: regular, rate, and rhythm.   Lungs: Respiratory effort nonlabored Abd: soft, NT, ND GU: seton in place, L buttock wound stable, no significant cellulitis or induration present    Lab Results:  Recent Labs    04/03/21 0840 04/04/21 0408  WBC 8.5 7.2  HGB 7.9* 8.5*  HCT 24.8* 27.0*  PLT 287 305   BMET Recent Labs    04/03/21 0428 04/04/21 0408  NA 138 140  K 4.3 4.1  CL 107 107  CO2 24 25  GLUCOSE 206* 118*  BUN 36* 26*  CREATININE 1.75* 1.40*  CALCIUM 8.5* 8.4*   PT/INR No results for input(s): LABPROT, INR in the last 72 hours. CMP     Component Value Date/Time   NA 140 04/04/2021 0408   NA 147 (H) 08/21/2013 1032   K 4.1 04/04/2021 0408   K 3.6 08/21/2013 1032   CL 107 04/04/2021 0408   CL 112 (H) 08/21/2012 1132   CO2 25 04/04/2021 0408   CO2 21 (L) 08/21/2013 1032   GLUCOSE 118 (H) 04/04/2021 0408   GLUCOSE 72 08/21/2013 1032   GLUCOSE 107 (H) 08/21/2012 1132   BUN 26 (H) 04/04/2021 0408   BUN 20.0 08/21/2013 1032   CREATININE 1.40 (H) 04/04/2021 0408   CREATININE 1.8 (H) 08/21/2013 1032    CALCIUM 8.4 (L) 04/04/2021 0408   CALCIUM 9.1 08/21/2013 1032   PROT 6.6 04/01/2021 0706   PROT 6.9 08/21/2013 1032   ALBUMIN 2.7 (L) 04/01/2021 0706   ALBUMIN 3.6 08/21/2013 1032   AST 23 04/01/2021 0706   AST 22 08/21/2013 1032   ALT 20 04/01/2021 0706   ALT 26 08/21/2013 1032   ALKPHOS 104 04/01/2021 0706   ALKPHOS 58 08/21/2013 1032   BILITOT 0.6 04/01/2021 0706   BILITOT 0.22 08/21/2013 1032   GFRNONAA 52 (L) 04/04/2021 0408   GFRAA 54 (L) 01/23/2017 0430   Lipase  No results found for: LIPASE     Studies/Results: No results found.  Anti-infectives: Anti-infectives (From admission, onward)   Start     Dose/Rate Route Frequency Ordered Stop   04/02/21 2300  vancomycin (VANCOREADY) IVPB 1250 mg/250 mL  Status:  Discontinued        1,250 mg 166.7 mL/hr over 90 Minutes Intravenous Every 48 hours 03/31/21 2244 04/02/21 1045   04/01/21 0800  piperacillin-tazobactam (ZOSYN) IVPB 3.375 g        3.375 g 12.5 mL/hr over 240 Minutes Intravenous Every 8 hours 03/31/21 2243     03/31/21 2245  clindamycin (CLEOCIN) IVPB 600 mg  Status:  Discontinued  600 mg 100 mL/hr over 30 Minutes Intravenous Every 8 hours 03/31/21 2231 04/03/21 1412   03/31/21 2245  vancomycin (VANCOREADY) IVPB 2000 mg/400 mL        2,000 mg 200 mL/hr over 120 Minutes Intravenous STAT 03/31/21 2236 04/01/21 0747   03/31/21 2115  piperacillin-tazobactam (ZOSYN) IVPB 3.375 g        3.375 g 100 mL/hr over 30 Minutes Intravenous  Once 03/31/21 2113 03/31/21 2321   03/31/21 2100  piperacillin-tazobactam (ZOSYN) IVPB 3.375 g  Status:  Discontinued        3.375 g 100 mL/hr over 30 Minutes Intravenous  Once 03/31/21 2056 03/31/21 2058   03/31/21 2100  piperacillin-tazobactam (ZOSYN) IVPB 4.5 g  Status:  Discontinued        4.5 g 200 mL/hr over 30 Minutes Intravenous  Once 03/31/21 2058 03/31/21 2112       Assessment/Plan IDDM AKI on CKD stage III HTN Prostate cancer s/p seeds Recent COVID  infection 2+ weeks ago with resolution in symptoms   Perirectal abscess and rectal ulceration  S/P EUA, I&D, placement of seton 04/01/21 Dr. Marlou Starks  - POD#3 - seton in place without significant cellulitis  - sitz baths 3-4x daily or more often if needed with bowel function  - do not re-pack incision  - cx with pan sensitive E. Coli - ID recommending augmentin on discharge, I would recommend a total of 5 days abx - stable for discharge from a surgical standpoint   FEN: CM diet, IVF VTE: SCDs, ok to have chemical prophylaxis from a surgical standpoint  ID: vanc 5/13>5/15; Zosyn 5/13>>, clinda 5/13>5/16  LOS: 4 days    Norm Parcel, [facility] 04/04/2021, 8:59 AM Please see Amion for pager number during day hours 7:00am-4:30pm

## 2021-04-04 NOTE — Progress Notes (Signed)
Occupational Therapy Evaluation  Patient reports he and spouse currently living with his son, can stay on the main level with no steps to enter. At baseline patient states bringing walking stick with him, otherwise does not use AD and has assist with lower body dressing/socks. Currently patient min A for trunk support out of bed, min G for safety with walker to ambulate in room and transfer to recliner chair and max A for managing socks. Recommend continued acute OT services to maximize patient balance, activity tolerance, safety in order to facilitate D/C to venue listed below.    04/04/21 1020  OT Visit Information  Last OT Received On 04/04/21  Assistance Needed +1  PT/OT/SLP Co-Evaluation/Treatment Yes  Reason for Co-Treatment For patient/therapist safety  OT goals addressed during session ADL's and self-care  History of Present Illness Patient admitted 03/31/2021 for perirectal abcess.  S/PEXAM UNDER ANESTHESIA, INCISION AND DRAINAGE PERIRECTAL ABSCESS WITH ANAL FISTULA, PLACEMENT OF SETON on %/14. PMH: TKA, THA, stroke, peripheral neuropathy.  Precautions  Precautions Fall  Precaution Comments prerirectal drain, urgency for BM  Restrictions  Weight Bearing Restrictions No  Home Living  Family/patient expects to be discharged to: Private residence  Living Arrangements Spouse/significant other  Available Help at Discharge Available 24 hours/day  Type of [place] (son's house)  Home Access Level entry  [place] Two level;Able to live on main level with bedroom/bathroom  Bathroom Shower/Tub Tub/shower unit  [place] - 2 wheels;Walker - 4 wheels;Cane - quad;Database administrator aid  Additional Comments staying with son, on first level  Prior Function  Level of Independence Needs assistance  Gait / Transfers Assistance Needed reports independent, brings walking stick with him  ADL's / Homemaking Assistance Needed reports spouse assist with  doff/don socks or uses sock aid  Communication  Communication No difficulties  Pain Assessment  Pain Assessment Faces  Faces Pain Scale 4  Pain Location rerirectal area  Pain Descriptors / Indicators Discomfort  Pain Intervention(s) Monitored during session  Cognition  Arousal/Alertness Awake/alert  Behavior During Therapy WFL for tasks assessed/performed  Overall Cognitive Status Within Functional Limits for tasks assessed  Upper Extremity Assessment  Upper Extremity Assessment Overall WFL for tasks assessed  Lower Extremity Assessment  Lower Extremity Assessment Defer to PT evaluation  Cervical / Trunk Assessment  Cervical / Trunk Assessment Normal  ADL  Overall ADL's  Needs assistance/impaired  Eating/Feeding Independent;Sitting  Grooming Set up;Sitting  Upper Body Bathing Set up;Sitting  Lower Body Bathing Moderate assistance;Sitting/lateral leans;Sit to/from stand  Upper Body Dressing  Set up;Sitting  Lower Body Dressing Sitting/lateral leans;Sit to/from stand;Maximal assistance  Lower Body Dressing Details (indicate cue type and reason) patient able to doff L sock, needing assist to doff R and don B socks, reports difficulty with this at baseline  Toilet Transfer Min guard;Cueing for sequencing;RW  Toilet Transfer Details (indicate cue type and reason) min G for safety  Toileting- Clothing Manipulation and Hygiene Moderate assistance;Sit to/from stand;Sitting/lateral lean  Functional mobility during ADLs Min guard;Rolling walker;Cueing for safety;Cueing for sequencing  General ADL Comments patient needing increased assistance with self care tasks due to decreased activity tolerance, balance  Bed Mobility  Overal bed mobility Needs Assistance  Bed Mobility Supine to Sit  Supine to sit Min assist;HOB elevated  General bed mobility comments gentle assist to right trunk  Transfers  Overall transfer level Needs assistance  Equipment used Rolling walker (2 wheeled)  Transfers  Sit to/from Stand  Sit to Stand  Min guard  General transfer comment close guarding to stand from bed using RW  Balance  Overall balance assessment Needs assistance  Sitting-balance support No upper extremity supported;Feet supported  Sitting balance-Leahy Scale Good  Standing balance support During functional activity;Bilateral upper extremity supported  Standing balance-Leahy Scale Fair  Standing balance comment static stand without UE support  OT - End of Session  Equipment Utilized During Treatment Rolling walker  Activity Tolerance Patient tolerated treatment well  Patient left in chair;with call bell/phone within reach;with chair alarm set  Nurse Communication Mobility status  OT Assessment  OT Recommendation/Assessment Patient needs continued OT Services  OT Visit Diagnosis Other abnormalities of gait and mobility (R26.89);Pain  Pain - part of body  (rectum)  OT Problem List Pain;Decreased activity tolerance;Impaired balance (sitting and/or standing);Decreased safety awareness  OT Plan  OT Frequency (ACUTE ONLY) Min 2X/week  OT Treatment/Interventions (ACUTE ONLY) Self-care/ADL training;DME and/or AE instruction;Therapeutic activities;Balance training;Patient/family education  AM-PAC OT "6 Clicks" Daily Activity Outcome Measure (Version 2)  Help from another person eating meals? 4  Help from another person taking care of personal grooming? 3  Help from another person toileting, which includes using toliet, bedpan, or urinal? 2  Help from another person bathing (including washing, rinsing, drying)? 2  Help from another person to put on and taking off regular upper body clothing? 3  Help from another person to put on and taking off regular lower body clothing? 2  6 Click Score 16  OT Recommendation  Follow Up Recommendations No OT follow up;Supervision - Intermittent  OT Equipment Tub/shower seat  Individuals Consulted  Consulted and Agree with Results and Recommendations  Patient  Acute Rehab OT Goals  Patient Stated Goal to go home  OT Goal Formulation With patient  Time For Goal Achievement 04/18/21  Potential to Achieve Goals Good  OT Time Calculation  OT Start Time (ACUTE ONLY) 4696  OT Stop Time (ACUTE ONLY) 0850  OT Time Calculation (min) 29 min  OT General Charges  $OT Visit 1 Visit  OT Evaluation  $OT Eval Low Complexity 1 Low  Written Expression  Dominant Hand Right   Delbert Phenix OT OT pager: [phone]

## 2021-04-04 NOTE — Progress Notes (Signed)
PROGRESS NOTE    [name]  FBP:[id number] DOB: 10-08-1946 DOA: 03/31/2021 PCP: [facility]    No chief complaint on file.   Brief Narrative:  History of hypertension, diabetes, CVA in 2015,CKD 3A, anemia of chronic disease, gout, OSA, prostate cancer status post seeds placed in December, previous perirectal abscess without fistula, with c/o 5 day h/o rectal pain, associated scrotal pain and swelling , diarrhea ,was at urologist office who noted fever 101.4, sent to ED  WBC 16.1k. Creat 2.5 up from 1.6 baseline, blood glucose 55, Lactate nl, no hypotension CT A/P: soft tissue gas involving L and midline of perineum extending to posterior base of penis. No definate connection to anorectum. Concern for necrotizing fasciitis   Assessment & Plan:   Principal Problem:   Perirectal abscess Active Problems:   Sepsis with acute renal failure without septic shock (HCC)   Fournier's gangrene   DM (diabetes mellitus) (HCC)   CKD (chronic kidney disease)   Anemia   HTN (hypertension)   Malignant neoplasm of prostate (HCC)   Cellulitis of perineum   AKI (acute kidney injury) (Drain)   Renal failure (ARF), acute on chronic (HCC)  1 perirectal abscess/necrotizing fasciitis/sepsis, POA -Patient presented with fever, leukocytosis, tachypnea, acute kidney injury noted to have a perirectal abscess. -Patient status post incision and drainage of perirectal abscess with anal fistula and placement of seton per Dr. Marlou Starks 04/01/2021. -MRSA PCR negative. -Blood cultures with no growth to date. -Urine cultures negative. -Wound cultures with few gram-negative rods, few gram-positive cocci, susceptibilities pending. -Patient seen in consultation by ID who recommended discontinuation of IV clindamycin, continuation of IV Zosyn while in-house with transition to Augmentin x5 more days on discharge.   -Appreciate ID input and recommendations  -Per general surgery and urology.    2. ??   Postop delirium-resolved -Patient noted to have an episode of confusion on the night of 04/01/2021 which has since resolved.  3.  Insulin-dependent type 2 diabetes mellitus presented with hypoglycemia/history of peripheral neuropathy -Patient noted on admission to have a blood glucose level of 55 initially on presentation received D10 in the ED and briefly on D5 half-normal saline. -Hemoglobin A1c 6.5 (04/01/2021) -CBG on 107 this morning  -Decrease Lantus dose to 15 units daily.  Continue SSI.  4.  Acute kidney injury on chronic kidney disease stage IIIa -Creatinine noted to be 2.58 on presentation. -Baseline creatinine approximately 1.6. -Likely secondary to prerenal azotemia in the setting of ACE inhibitor. -Urinalysis nitrite negative, leukocytes negative, negative protein. -Renal function improving daily and creatinine down to 1.40.  -Continue to hold ACE inhibitor. -Outpatient follow-up.  5.  Anemia -Likely anemia of chronic disease. -Anemia panel with iron level of 22, TIBC of 180, ferritin of 523, folate of 7.8, vitamin B12 of 661.   -Hemoglobin currently stable at 8.5.   -Transfusion threshold hemoglobin  < 7.   6.  Hypertension -Continue Coreg. -Increase back to home regimen of Norvasc 10 mg daily.  -Continue to hold ACE inhibitor secondary to acute kidney injury.  -Outpatient follow-up.  7.  History of prostate cancer status post seeds -Per urology.  8.  Recent COVID-19 infection -Patient noted to have tested positive with COVID 2 weeks prior to admission currently asymptomatic. -Repeat testing on admission remains positive. -Patient not hypoxic. -Chest x-ray with no acute findings. -Currently not requiring airborne precautions.   DVT prophylaxis: SCDs Code Status: Full Family Communication: Updated patient.  No family at bedside. Disposition:   Status  is: Inpatient    Dispo: The patient is from: Home              Anticipated d/c is to: Home with home  health if patient and family agree.              Patient currently on IV antibiotics, being treated for perirectal abscess, hopefully discharge tomorrow.    Difficult to place patient no       Consultants:   Urology: Dr. Lovena Neighbours 04/01/2021  General surgery: Dr. Marlou Starks 04/01/2021  Infectious disease: Dr.Comer 04/03/2021  Procedures:  CT abdomen and pelvis 03/31/2021  Chest x-ray 03/31/2021  Incision and drainage perirectal abscess with anal fistula, placement of seton per Dr. Marlou Starks 04/01/2021  Antimicrobials:   IV Zosyn 03/31/2021>>>>  IV clindamycin 03/31/2021>>>> 04/03/2021  IV vancomycin 5/13/ 2022x1 dose   Subjective: Patient sitting up in chair.  Feeling much better.  Denies any chest pain.  No shortness of breath.  States he is having loose stool which she describes more as soft/mushy.  1 loose stool this morning.  No abdominal pain.  Some perirectal pain however improving.  Per RN wife hesitant for patient to be discharged today stating patient having diarrhea and also patient very weak.  Objective: Vitals:   04/04/21 0400 04/04/21 0417 04/04/21 0600 04/04/21 0800  BP:  (!) 159/50 (!) 189/63 (!) 142/35  Pulse:  71 73 73  Resp:  17 18 17   Temp: (!) 89.2 F (31.8 C)   98.2 F (36.8 C)  TempSrc: Oral   Oral  SpO2:  95% 97% 97%  Weight:      Height:        Intake/Output Summary (Last 24 hours) at 04/04/2021 0918 Last data filed at 04/04/2021 0400 Gross per 24 hour  Intake 1801.81 ml  Output 1750 ml  Net 51.81 ml   Filed Weights   03/31/21 1521 04/01/21 0500  Weight: 100.7 kg 103.9 kg    Examination:  General exam: : NAD Respiratory system: CTA B anterior lung fields.  No wheezes, no rhonchi.  Speaking in full sentences.  Normal respiratory effort. Cardiovascular system: Regular rate and rhythm no murmurs rubs or gallops.  No JVD.  No lower extremity edema.  Gastrointestinal system: Abdomen soft, nontender, nondistended, positive bowel sounds.  No rebound.  No  guarding. Central nervous system: Alert and oriented. No focal neurological deficits. Extremities: Symmetric 5 x 5 power. Skin: No rashes, lesions or ulcers Psychiatry: Judgement and insight appear normal. Mood & affect appropriate.    Data Reviewed: I have personally reviewed following labs and imaging studies  CBC: Recent Labs  Lab 03/31/21 1800 04/01/21 0706 04/02/21 0447 04/03/21 0840 04/04/21 0408  WBC 16.1* 11.4* 11.7* 8.5 7.2  NEUTROABS 12.7*  --  10.1* 5.6 4.6  HGB 9.8* 8.3* 7.7* 7.9* 8.5*  HCT 30.6* 26.0* 24.3* 24.8* 27.0*  MCV 86.7 85.2 86.2 87.6 87.7  PLT 242 266 279 287 801    Basic Metabolic Panel: Recent Labs  Lab 03/31/21 1800 04/01/21 0706 04/02/21 0447 04/03/21 0428 04/03/21 0840 04/04/21 0408  NA 136 133* 137 138  --  140  K 4.7 4.1 4.4 4.3  --  4.1  CL 102 103 107 107  --  107  CO2 23 22 21* 24  --  25  GLUCOSE 77 133* 251* 206*  --  118*  BUN 33* 33* 35* 36*  --  26*  CREATININE 2.58* 2.04* 2.02* 1.75*  --  1.40*  CALCIUM 9.1  8.3* 8.7* 8.5*  --  8.4*  MG  --   --   --   --  2.4  --     GFR: Estimated Creatinine Clearance: 55.1 mL/min (A) (by C-G formula based on SCr of 1.4 mg/dL (H)).  Liver Function Tests: Recent Labs  Lab 03/31/21 1800 04/01/21 0706  AST 33 23  ALT 26 20  ALKPHOS 134* 104  BILITOT 0.5 0.6  PROT 8.4* 6.6  ALBUMIN 3.7 2.7*    CBG: Recent Labs  Lab 04/03/21 1640 04/03/21 1940 04/03/21 2309 04/04/21 0410 04/04/21 0736  GLUCAP 125* 162* 118* 111* 107*     Recent Results (from the past 240 hour(s))  Blood culture (routine single)     Status: None (Preliminary result)   Collection Time: 03/31/21  6:00 PM   Specimen: BLOOD  Result Value Ref Range Status   Specimen Description   Final    BLOOD BLOOD LEFT HAND Performed at [facility], [place] [address], [place], [place] [address]    Special Requests   Final    BOTTLES DRAWN AEROBIC AND ANAEROBIC Blood Culture adequate volume Performed  at [facility] [address], [place], [place] [address]    Culture   Final    NO GROWTH 4 DAYS Performed at [facility], [place] [address], [place], [place] [address]    Report Status PENDING  Incomplete  Resp Panel by RT-PCR (Flu A&B, Covid) Nasopharyngeal Swab     Status: Abnormal   Collection Time: 03/31/21  7:52 PM   Specimen: Nasopharyngeal Swab; Nasopharyngeal(NP) swabs in vial transport medium  Result Value Ref Range Status   SARS Coronavirus 2 by RT PCR POSITIVE (A) NEGATIVE Final    Comment: RESULT CALLED TO, READ BACK BY AND VERIFIED WITH: BANNO,A 03/31/21 @2147  BY SEELMOL (NOTE) SARS-CoV-2 target nucleic acids are DETECTED.  The SARS-CoV-2 RNA is generally detectable in upper respiratory specimens during the acute phase of infection. Positive results are indicative of the presence of the identified virus, but do not rule out bacterial infection or co-infection with other pathogens not detected by the test. Clinical correlation with patient history and other diagnostic information is necessary to determine patient infection status. The expected result is Negative.  Fact Sheet for Patients: EntrepreneurPulse.com.au  Fact Sheet for Healthcare Providers: IncredibleEmployment.be  This test is not yet approved or cleared by the [place] FDA and  has been authorized for detection and/or diagnosis of SARS-CoV-2 by FDA under an Emergency Use Authorization (EUA).  This EUA will remain in effect (meaning this test can be  used) for the duration of  the COVID-19 declaration under Section 564(b)(1) of the Act, 21 U.S.C. section 360bbb-3(b)(1), unless the authorization is terminated or revoked sooner.     Influenza A by PCR NEGATIVE NEGATIVE Final   Influenza B by PCR NEGATIVE NEGATIVE Final    Comment: (NOTE) The Xpert Xpress SARS-CoV-2/FLU/RSV plus assay is intended as an aid in the diagnosis of influenza  from Nasopharyngeal swab specimens and should not be used as a sole basis for treatment. Nasal washings and aspirates are unacceptable for Xpert Xpress SARS-CoV-2/FLU/RSV testing.  Fact Sheet for Patients: EntrepreneurPulse.com.au  Fact Sheet for Healthcare Providers: IncredibleEmployment.be  This test is not yet approved or cleared by the [place] FDA and has been authorized for detection and/or diagnosis of SARS-CoV-2 by FDA under an Emergency Use Authorization (EUA). This EUA will remain in effect (meaning this test can be used) for  the duration of the COVID-19 declaration under Section 564(b)(1) of the Act, 21 U.S.C. section 360bbb-3(b)(1), unless the authorization is terminated or revoked.  Performed at [facility], [place] [address], [place], [place] [address]   MRSA PCR Screening     Status: None   Collection Time: 04/01/21  3:45 AM   Specimen: Nasal Mucosa; Nasopharyngeal  Result Value Ref Range Status   MRSA by PCR NEGATIVE NEGATIVE Final    Comment:        The GeneXpert MRSA Assay (FDA approved for NASAL specimens only), is one component of a comprehensive MRSA colonization surveillance program. It is not intended to diagnose MRSA infection nor to guide or monitor treatment for MRSA infections. Performed at [facility], [place] [address] [place], [place] [address]   Culture, blood (Routine X 2) w Reflex to ID Panel     Status: None (Preliminary result)   Collection Time: 04/01/21  7:06 AM   Specimen: BLOOD  Result Value Ref Range Status   Specimen Description   Final    BLOOD LEFT HAND Performed at [facility] [address], [place], [place] [address]    Special Requests   Final    BOTTLES DRAWN AEROBIC ONLY Blood Culture adequate volume Performed at [facility] [address] [place], [place] [address]    Culture   Final    NO GROWTH 3  DAYS Performed at [facility], [place] [address], [place], [place] [address]    Report Status PENDING  Incomplete  Aerobic/Anaerobic Culture w Gram Stain (surgical/deep wound)     Status: None (Preliminary result)   Collection Time: 04/01/21 10:29 AM   Specimen: PATH Other; Tissue  Result Value Ref Range Status   Specimen Description   Final    PERINEAL ABCESS Performed at [facility] [address] [place], [place] [address]    Special Requests   Final    NONE Performed at [facility], [place] [address], [place], [place] [address]    Gram Stain   Final    MODERATE WBC PRESENT, PREDOMINANTLY PMN FEW GRAM POSITIVE COCCI FEW GRAM NEGATIVE RODS    Culture   Final    FEW ESCHERICHIA COLI HOLDING FOR POSSIBLE ANAEROBE Performed at [facility], [place] [address] [place], [place] [address]    Report Status PENDING  Incomplete   Organism ID, Bacteria ESCHERICHIA COLI  Final      Susceptibility   Escherichia coli - MIC*    AMPICILLIN <=2 SENSITIVE Sensitive     CEFAZOLIN <=4 SENSITIVE Sensitive     CEFEPIME <=0.12 SENSITIVE Sensitive     CEFTAZIDIME <=1 SENSITIVE Sensitive     CEFTRIAXONE <=0.25 SENSITIVE Sensitive     CIPROFLOXACIN <=0.25 SENSITIVE Sensitive     GENTAMICIN <=1 SENSITIVE Sensitive     IMIPENEM <=0.25 SENSITIVE Sensitive     TRIMETH/SULFA <=20 SENSITIVE Sensitive     AMPICILLIN/SULBACTAM <=2 SENSITIVE Sensitive     PIP/TAZO <=4 SENSITIVE Sensitive     * FEW ESCHERICHIA COLI  Culture, Urine     Status: None   Collection Time: 04/01/21  4:03 PM   Specimen: Urine, Clean Catch  Result Value Ref Range Status   Specimen Description   Final    URINE, CLEAN CATCH Performed at [facility],  [address], [place],  [address]    Special Requests   Final    NONE Performed  at [facility], [place] [address], [place], [place] [address]    Culture   Final    NO GROWTH Performed  at [facility], [place] [address], [place],  [address]    Report Status 04/02/2021 FINAL  Final         Radiology Studies: No results found.      Scheduled Meds: . (feeding supplement) PROSource Plus  30 mL Oral TID BM  . amitriptyline  50 mg Oral QHS  . amLODipine  10 mg Oral Daily  . atorvastatin  20 mg Oral QHS  . buPROPion  150 mg Oral Daily  . carvedilol  3.125 mg Oral BID WC  . Chlorhexidine Gluconate Cloth  6 each Topical Daily  . febuxostat  40 mg Oral QHS  . feeding supplement  1 Container Oral TID BM  . ferrous sulfate  325 mg Oral Q breakfast  . gabapentin  600 mg Oral QHS  . insulin aspart  0-9 Units Subcutaneous Q4H  . insulin glargine  15 Units Subcutaneous QHS  . mouth rinse  15 mL Mouth Rinse BID  . multivitamin with minerals  1 tablet Oral Daily  . polycarbophil  625 mg Oral Daily  . sodium chloride flush  10-40 mL Intracatheter Q12H  . tamsulosin  0.4 mg Oral QPC supper  . tetrahydrozoline  3 drop Both Eyes Daily  . vitamin B-12  1,000 mcg Oral Daily   Continuous Infusions: . piperacillin-tazobactam (ZOSYN)  IV 3.375 g (04/04/21 0743)     LOS: 4 days    Time spent: 35 minutes    Irine Seal, MD [facility]   To contact the attending provider between 7A-7P or the covering provider during after hours 7P-7A, please log into the web site [email or web address] and access using universal [place] password for that web site. If you do not have the password, please call the hospital operator.  04/04/2021, 9:18 AM

## 2021-04-04 NOTE — Progress Notes (Signed)
[facility]ious [facility]   Reason for visit: Follow up on perirectal abscess  Interval History: continues with diarrhea, WBC wnl, afebrile. Concern of contamination of wound with stool.    day 5 piperacillin/tazobactam  Physical Exam: Constitutional:  Vitals:   04/04/21 1000 04/04/21 1200  BP: (!) 156/58   Pulse: 74   Resp: (!) 21   Temp:  98 F (36.7 C)  SpO2: 93%    patient appears in NAD Respiratory: Normal respiratory effort; CTA B Cardiovascular: RRR GI: soft, nt, nd  Review of Systems: Constitutional: negative for fevers and chills Integument/breast: negative for rash  Lab Results  Component Value Date   WBC 7.2 04/04/2021   HGB 8.5 (L) 04/04/2021   HCT 27.0 (L) 04/04/2021   MCV 87.7 04/04/2021   PLT 305 04/04/2021    Lab Results  Component Value Date   CREATININE 1.40 (H) 04/04/2021   BUN 26 (H) 04/04/2021   NA 140 04/04/2021   K 4.1 04/04/2021   CL 107 04/04/2021   CO2 25 04/04/2021    Lab Results  Component Value Date   ALT 20 04/01/2021   AST 23 04/01/2021   ALKPHOS 104 04/01/2021     Microbiology: Recent Results (from the past 240 hour(s))  Blood culture (routine single)     Status: None (Preliminary result)   Collection Time: 03/31/21  6:00 PM   Specimen: BLOOD  Result Value Ref Range Status   Specimen Description   Final    BLOOD BLOOD LEFT HAND Performed at [facility], [place] [address], [place], [place] [address]    Special Requests   Final    BOTTLES DRAWN AEROBIC AND ANAEROBIC Blood Culture adequate volume Performed at [place] [address], [place], [place] [address]    Culture   Final    NO GROWTH 4 DAYS Performed at [facility], [place] [address] [place], [place] [address]    Report Status PENDING  Incomplete  Resp Panel by RT-PCR (Flu A&B, Covid) Nasopharyngeal Swab     Status: Abnormal   Collection Time: 03/31/21  7:52 PM   Specimen: Nasopharyngeal Swab;  Nasopharyngeal(NP) swabs in vial transport medium  Result Value Ref Range Status   SARS Coronavirus 2 by RT PCR POSITIVE (A) NEGATIVE Final    Comment: RESULT CALLED TO, READ BACK BY AND VERIFIED WITH: BANNO,A 03/31/21 @2147  BY SEELMOL (NOTE) SARS-CoV-2 target nucleic acids are DETECTED.  The SARS-CoV-2 RNA is generally detectable in upper respiratory specimens during the acute phase of infection. Positive results are indicative of the presence of the identified virus, but do not rule out bacterial infection or co-infection with other pathogens not detected by the test. Clinical correlation with patient history and other diagnostic information is necessary to determine patient infection status. The expected result is Negative.  Fact Sheet for Patients: EntrepreneurPulse.com.au  Fact Sheet for Healthcare Providers: IncredibleEmployment.be  This test is not yet approved or cleared by the [place] FDA and  has been authorized for detection and/or diagnosis of SARS-CoV-2 by FDA under an Emergency Use Authorization (EUA).  This EUA will remain in effect (meaning this test can be  used) for the duration of  the COVID-19 declaration under Section 564(b)(1) of the Act, 21 U.S.C. section 360bbb-3(b)(1), unless the authorization is terminated or revoked sooner.     Influenza A by PCR NEGATIVE NEGATIVE Final   Influenza B by PCR NEGATIVE NEGATIVE Final    Comment: (  NOTE) The Xpert Xpress SARS-CoV-2/FLU/RSV plus assay is intended as an aid in the diagnosis of influenza from Nasopharyngeal swab specimens and should not be used as a sole basis for treatment. Nasal washings and aspirates are unacceptable for Xpert Xpress SARS-CoV-2/FLU/RSV testing.  Fact Sheet for Patients: EntrepreneurPulse.com.au  Fact Sheet for Healthcare Providers: IncredibleEmployment.be  This test is not yet approved or cleared by the  [place] FDA and has been authorized for detection and/or diagnosis of SARS-CoV-2 by FDA under an Emergency Use Authorization (EUA). This EUA will remain in effect (meaning this test can be used) for the duration of the COVID-19 declaration under Section 564(b)(1) of the Act, 21 U.S.C. section 360bbb-3(b)(1), unless the authorization is terminated or revoked.  Performed at [facility], [place] [address] [place], [place] [address]   MRSA PCR Screening     Status: None   Collection Time: 04/01/21  3:45 AM   Specimen: Nasal Mucosa; Nasopharyngeal  Result Value Ref Range Status   MRSA by PCR NEGATIVE NEGATIVE Final    Comment:        The GeneXpert MRSA Assay (FDA approved for NASAL specimens only), is one component of a comprehensive MRSA colonization surveillance program. It is not intended to diagnose MRSA infection nor to guide or monitor treatment for MRSA infections. Performed at [facility], [place] [address] [place], [place] [address]   Culture, blood (Routine X 2) w Reflex to ID Panel     Status: None (Preliminary result)   Collection Time: 04/01/21  7:06 AM   Specimen: BLOOD  Result Value Ref Range Status   Specimen Description   Final    BLOOD LEFT HAND Performed at [place] [address] [place], [place] [address]    Special Requests   Final    BOTTLES DRAWN AEROBIC ONLY Blood Culture adequate volume Performed at [place] [address], [place], [place] [address]    Culture   Final    NO GROWTH 3 DAYS Performed at [facility], [place] [address] [place], [place] [address]    Report Status PENDING  Incomplete  Aerobic/Anaerobic Culture w Gram Stain (surgical/deep wound)     Status: None   Collection Time: 04/01/21 10:29 AM   Specimen: PATH Other; Tissue  Result Value Ref Range Status   Specimen Description   Final    PERINEAL ABCESS Performed at [place] [address], [place], [place] [address]    Special Requests   Final    NONE Performed at [facility], [place] [address] [place], [place] [address]    Gram Stain   Final    MODERATE WBC PRESENT, PREDOMINANTLY PMN FEW GRAM POSITIVE COCCI FEW GRAM NEGATIVE RODS    Culture   Final    FEW ESCHERICHIA COLI FEW BACTEROIDES FRAGILIS BETA LACTAMASE POSITIVE Performed at [facility], [place] [address], [place],  [address]    Report Status 04/04/2021 FINAL  Final   Organism ID, Bacteria ESCHERICHIA COLI  Final      Susceptibility   Escherichia coli - MIC*    AMPICILLIN <=2 SENSITIVE Sensitive     CEFAZOLIN <=4 SENSITIVE Sensitive     CEFEPIME <=0.12 SENSITIVE Sensitive     CEFTAZIDIME <=1 SENSITIVE Sensitive     CEFTRIAXONE <=0.25 SENSITIVE Sensitive     CIPROFLOXACIN <=0.25 SENSITIVE Sensitive     GENTAMICIN <=1 SENSITIVE Sensitive     IMIPENEM <=  0.25 SENSITIVE Sensitive     TRIMETH/SULFA <=20 SENSITIVE Sensitive     AMPICILLIN/SULBACTAM <=2 SENSITIVE Sensitive     PIP/TAZO <=4 SENSITIVE Sensitive     * FEW ESCHERICHIA COLI  Culture, Urine     Status: None   Collection Time: 04/01/21  4:03 PM   Specimen: Urine, Clean Catch  Result Value Ref Range Status   Specimen Description   Final    URINE, CLEAN CATCH Performed at [facility], [place] [address] [place], [place] [address]    Special Requests   Final    NONE Performed at [facility], [place] [address] [place], [place] [address]    Culture   Final    NO GROWTH Performed at [facility], [place] [address], [place], [place] [address]    Report Status 04/02/2021 FINAL  Final    Impression/Plan:  1. Perirectal abscess - no new growth in cultures, still with E coli.  On piptazo and no new issues.   Will continue with IV piptazo and at the time of discharge, he can continue with oral Augmentin for an additional 5 days.    2.  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    I will sign off, call with questions

## 2021-04-04 NOTE — TOC Progression Note (Signed)
Transition of Care [facility]) - Progression Note    Patient Details  Name: NIVAN MELENDREZ MRN: [id number] Date of Birth: 02-09-1946  Transition of Care [facility]) CM/SW Contact  Leeroy Cha, RN Phone Number: 04/04/2021, 11:40 AM  Clinical Narrative:    Spoke with wife patient does not want pt coming to the house but wants to go outside pt center.  RN and md notified.   Expected Discharge Plan: Home/Self Care Barriers to Discharge: Continued Medical Work up  Expected Discharge Plan and Services Expected Discharge Plan: Home/Self Care   Discharge Planning Services: CM Consult   Living arrangements for the past 2 months: Single Family Home                                       Social Determinants of Health (SDOH) Interventions    Readmission Risk Interventions No flowsheet data found.

## 2021-04-04 NOTE — Evaluation (Addendum)
Physical Therapy Evaluation Patient Details Name: [name] MRN: [id number] DOB: [birth year] Today's Date: 04/04/2021   History of Present Illness  Patient admitted 03/31/2021 for perirectal abcess.  S/PEXAM UNDER ANESTHESIA, INCISION AND DRAINAGE PERIRECTAL ABSCESS WITH ANAL FISTULA, PLACEMENT OF SETON on %/14. PMH: TKA, THA, stroke, peripheral neuropathy.  Clinical Impression  The patient is eager to beigin ambulating. Ambulated x 20' in room using RW. Patient should progress to Dc home, plans to return staying with son and wife there also.  VSS for activity, mild dizziness first time up which did not worsen. Pt admitted with above diagnosis.  Pt currently with functional limitations due to the deficits listed below (see PT Problem List). Pt will benefit from skilled PT to increase their independence and safety with mobility to allow discharge to the venue listed below.         Follow Up Recommendations HHPT   Equipment Recommendations  None recommended by PT    Recommendations for Other Services       Precautions / Restrictions Precautions Precautions: Fall Precaution Comments: prerirectal drain, urgency for BM      Mobility  Bed Mobility Overal bed mobility: Needs Assistance Bed Mobility: Supine to Sit     Supine to sit: Min assist;HOB elevated     General bed mobility comments: gentle assist to right trunk    Transfers Overall transfer level: Needs assistance Equipment used: Rolling walker (2 wheeled) Transfers: Sit to/from Stand Sit to Stand: Min guard         General transfer comment: close guarding to stand from bed using RW  Ambulation/Gait Ambulation/Gait assistance: Min guard Gait Distance (Feet): 20 Feet Assistive device: Rolling walker (2 wheeled) Gait Pattern/deviations: Step-through pattern     General Gait Details: able to maneuvre around objuects in room and turn around  [employer] Mobility    Modified Rankin  (Stroke Patients Only)       Balance Overall balance assessment: Needs assistance Sitting-balance support: No upper extremity supported;Feet supported Sitting balance-Leahy Scale: Good     Standing balance support: During functional activity;Bilateral upper extremity supported Standing balance-Leahy Scale: Fair                               Pertinent Vitals/Pain Pain Assessment: (P) Faces Faces Pain Scale: Hurts little more Pain Location: rerirectal area Pain Descriptors / Indicators: Discomfort Pain Intervention(s): Monitored during session;Premedicated before session    [place] expects to be discharged to:: Private residence Living Arrangements: Spouse/significant other Available Help at Discharge: Available 24 hours/day Type of Home: House (son) Home Access: Level entry     Home Layout: Two level;Able to live on main level with bedroom/bathroom Home Equipment: Kasandra Knudsen - single point;Walker - 4 wheels Additional Comments: staying with son, on first level    Prior Function Level of Independence: Independent               Hand Dominance        Extremity/Trunk Assessment        Lower Extremity Assessment Lower Extremity Assessment: Generalized weakness    Cervical / Trunk Assessment Cervical / Trunk Assessment: Normal  Communication   Communication: No difficulties  Cognition Arousal/Alertness: Awake/alert Behavior During Therapy: WFL for tasks assessed/performed Overall Cognitive Status: Within Functional Limits for tasks assessed  General Comments      Exercises     Assessment/Plan    PT Assessment Patient needs continued PT services  PT Problem List Decreased strength;Decreased range of motion;Decreased knowledge of precautions;Decreased mobility;Decreased knowledge of use of DME;Decreased activity tolerance;Decreased safety awareness       PT Treatment  Interventions DME instruction;Therapeutic activities;Gait training;Therapeutic exercise;Patient/family education;Functional mobility training    PT Goals (Current goals can be found in the Care Plan section)  Acute Rehab PT Goals Patient Stated Goal: to go home PT Goal Formulation: With patient Time For Goal Achievement: 04/18/21 Potential to Achieve Goals: Good    Frequency Min 3X/week   Barriers to discharge        Co-evaluation               AM-PAC PT "6 Clicks" Mobility  Outcome Measure Help needed turning from your back to your side while in a flat bed without using bedrails?: A Little Help needed moving from lying on your back to sitting on the side of a flat bed without using bedrails?: A Little Help needed moving to and from a bed to a chair (including a wheelchair)?: A Little Help needed standing up from a chair using your arms (e.g., wheelchair or bedside chair)?: A Little Help needed to walk in hospital room?: A Little Help needed climbing 3-5 steps with a railing? : A Lot 6 Click Score: 17    End of Session   Activity Tolerance: Patient tolerated treatment well Patient left: in chair;with call bell/phone within reach;with chair alarm set Nurse Communication: Mobility status PT Visit Diagnosis: Unsteadiness on feet (R26.81);Difficulty in walking, not elsewhere classified (R26.2);Muscle weakness (generalized) (M62.81)    Time: 4825-0037 PT Time Calculation (min) (ACUTE ONLY): 25 min   Charges:   PT Evaluation $PT Eval Low Complexity: [place] PT Acute Rehabilitation Services Pager [phone] Office [phone]   Claretha Cooper 04/04/2021, 10:21 AM

## 2021-04-04 NOTE — Progress Notes (Signed)
Physical Therapy addendum to Evaluation from today- Patient will benefit from HHPT as DC is soon and patient  Does demonstrate decreased tolerance to safe ambulation. Patient has a Careers information officer at home if needed.  Vivian Pager [phone] Office [phone]

## 2021-04-05 DIAGNOSIS — N493 Fournier gangrene: Secondary | ICD-10-CM | POA: Diagnosis not present

## 2021-04-05 LAB — CBC WITH DIFFERENTIAL/PLATELET
Abs Immature Granulocytes: 0.38 10*3/uL — ABNORMAL HIGH (ref 0.00–0.07)
Basophils Absolute: 0.1 10*3/uL (ref 0.0–0.1)
Basophils Relative: 1 %
Eosinophils Absolute: 0.4 10*3/uL (ref 0.0–0.5)
Eosinophils Relative: 6 %
HCT: 30.1 % — ABNORMAL LOW (ref 39.0–52.0)
Hemoglobin: 9.2 g/dL — ABNORMAL LOW (ref 13.0–17.0)
Immature Granulocytes: 5 %
Lymphocytes Relative: 23 %
Lymphs Abs: 1.6 10*3/uL (ref 0.7–4.0)
MCH: 26.9 pg (ref 26.0–34.0)
MCHC: 30.6 g/dL (ref 30.0–36.0)
MCV: 88 fL (ref 80.0–100.0)
Monocytes Absolute: 0.6 10*3/uL (ref 0.1–1.0)
Monocytes Relative: 8 %
Neutro Abs: 4 10*3/uL (ref 1.7–7.7)
Neutrophils Relative %: 57 %
Platelets: 307 10*3/uL (ref 150–400)
RBC: 3.42 MIL/uL — ABNORMAL LOW (ref 4.22–5.81)
RDW: 13.3 % (ref 11.5–15.5)
WBC: 7.1 10*3/uL (ref 4.0–10.5)
nRBC: 0 % (ref 0.0–0.2)

## 2021-04-05 LAB — BASIC METABOLIC PANEL
Anion gap: 8 (ref 5–15)
BUN: 20 mg/dL (ref 8–23)
CO2: 24 mmol/L (ref 22–32)
Calcium: 8.8 mg/dL — ABNORMAL LOW (ref 8.9–10.3)
Chloride: 107 mmol/L (ref 98–111)
Creatinine, Ser: 1.24 mg/dL (ref 0.61–1.24)
GFR, Estimated: >60 mL/min (ref >60)
Glucose, Bld: 110 mg/dL — ABNORMAL HIGH (ref 70–99)
Potassium: 3.8 mmol/L (ref 3.5–5.1)
Sodium: 139 mmol/L (ref 135–145)

## 2021-04-05 LAB — GLUCOSE, CAPILLARY
Glucose-Capillary: 107 mg/dL — ABNORMAL HIGH (ref 70–99)
Glucose-Capillary: 96 mg/dL (ref 70–99)
Glucose-Capillary: 137 mg/dL — ABNORMAL HIGH (ref 70–99)
Glucose-Capillary: 125 mg/dL — ABNORMAL HIGH (ref 70–99)
Glucose-Capillary: 170 mg/dL — ABNORMAL HIGH (ref 70–99)

## 2021-04-05 LAB — C DIFFICILE (CDIFF) QUICK SCRN (NO PCR REFLEX)
C Diff antigen: NEGATIVE
C Diff interpretation: NOT DETECTED
C Diff toxin: NEGATIVE

## 2021-04-05 LAB — CULTURE, BLOOD (SINGLE)
Culture: NO GROWTH
Special Requests: ADEQUATE

## 2021-04-05 MED ORDER — ENOXAPARIN SODIUM 40 MG/0.4ML IJ SOSY
40.0000 mg | PREFILLED_SYRINGE | INTRAMUSCULAR | Status: DC
  Administered 2021-04-05 – 2021-04-10 (×6): 40 mg via SUBCUTANEOUS
  Filled 2021-04-05 (×6): qty 0.4

## 2021-04-05 MED ORDER — DIPHENOXYLATE-ATROPINE 2.5-0.025 MG PO TABS
1.0000 | ORAL_TABLET | Freq: Two times a day (BID) | ORAL | Status: DC
  Administered 2021-04-05 – 2021-04-06 (×2): 1 via ORAL
  Filled 2021-04-05 (×2): qty 1

## 2021-04-05 MED ORDER — DIPHENOXYLATE-ATROPINE 2.5-0.025 MG PO TABS: 1.0000 | ORAL_TABLET | Freq: Four times a day (QID) | ORAL | Status: DC | PRN

## 2021-04-05 MED ORDER — FERROUS SULFATE 325 (65 FE) MG PO TABS
325.0000 mg | ORAL_TABLET | Freq: Two times a day (BID) | ORAL | Status: DC
  Administered 2021-04-05 – 2021-04-06 (×2): 325 mg via ORAL
  Filled 2021-04-05 (×2): qty 1

## 2021-04-05 MED ORDER — PIPERACILLIN-TAZOBACTAM 3.375 G IVPB
3.3750 g | Freq: Three times a day (TID) | INTRAVENOUS | Status: AC
  Administered 2021-04-05: 3.375 g via INTRAVENOUS
  Filled 2021-04-05: qty 50

## 2021-04-05 NOTE — Progress Notes (Signed)
Progress Note  4 Days Post-Op  Subjective: Patient reports slightly increased pain after just getting cleaned up. Still having incontinence of stool with urination. Wife at bedside and expressed concern about patient coming home with incontinence issues and her ability to help care for him at home. She would like him to go to a facility for rehab. Patient expressed that he does not want to go to a SNF and would like to see if incontinence improves today with bowel regimen.   Objective: Vital signs in last 24 hours: Temp:  [97.4 F (36.3 C)-98.5 F (36.9 C)] 97.4 F (36.3 C) (05/18 0506) Pulse Rate:  [66-86] 66 (05/18 0506) Resp:  [15-20] 15 (05/18 0506) BP: (153-180)/(61-90) 174/72 (05/18 0506) SpO2:  [99 %-100 %] 100 % (05/18 0506) Last BM Date: 04/04/21  Intake/Output from previous day: 05/17 0701 - 05/18 0700 In: [place] [P.O.:1640; IV Piggyback:142] Out: 5462 [Urine:1490] Intake/Output this shift: No intake/output data recorded.  PE: General: pleasant, WD,overweightmale who is laying in bed in NAD Heart: regular, rate, and rhythm.  Lungs: Respiratory effort nonlabored Abd: soft, NT, ND GU: seton in place, L buttock wound stable, no significant cellulitis or induration present   Lab Results:  Recent Labs    04/04/21 0408 04/05/21 0415  WBC 7.2 7.1  HGB 8.5* 9.2*  HCT 27.0* 30.1*  PLT 305 307   BMET Recent Labs    04/04/21 0408 04/05/21 0415  NA 140 139  K 4.1 3.8  CL 107 107  CO2 25 24  GLUCOSE 118* 110*  BUN 26* 20  CREATININE 1.40* 1.24  CALCIUM 8.4* 8.8*   PT/INR No results for input(s): LABPROT, INR in the last 72 hours. CMP     Component Value Date/Time   NA 139 04/05/2021 0415   NA 147 (H) 08/21/2013 1032   K 3.8 04/05/2021 0415   K 3.6 08/21/2013 1032   CL 107 04/05/2021 0415   CL 112 (H) 08/21/2012 1132   CO2 24 04/05/2021 0415   CO2 21 (L) 08/21/2013 1032   GLUCOSE 110 (H) 04/05/2021 0415   GLUCOSE 72 08/21/2013 1032   GLUCOSE  107 (H) 08/21/2012 1132   BUN 20 04/05/2021 0415   BUN 20.0 08/21/2013 1032   CREATININE 1.24 04/05/2021 0415   CREATININE 1.8 (H) 08/21/2013 1032   CALCIUM 8.8 (L) 04/05/2021 0415   CALCIUM 9.1 08/21/2013 1032   PROT 6.6 04/01/2021 0706   PROT 6.9 08/21/2013 1032   ALBUMIN 2.7 (L) 04/01/2021 0706   ALBUMIN 3.6 08/21/2013 1032   AST 23 04/01/2021 0706   AST 22 08/21/2013 1032   ALT 20 04/01/2021 0706   ALT 26 08/21/2013 1032   ALKPHOS 104 04/01/2021 0706   ALKPHOS 58 08/21/2013 1032   BILITOT 0.6 04/01/2021 0706   BILITOT 0.22 08/21/2013 1032   GFRNONAA >60 04/05/2021 0415   GFRAA 54 (L) 01/23/2017 0430   Lipase  No results found for: LIPASE     Studies/Results: No results found.  Anti-infectives: Anti-infectives (From admission, onward)   Start     Dose/Rate Route Frequency Ordered Stop   04/02/21 2300  vancomycin (VANCOREADY) IVPB 1250 mg/250 mL  Status:  Discontinued        1,250 mg 166.7 mL/hr over 90 Minutes Intravenous Every 48 hours 03/31/21 2244 04/02/21 1045   04/01/21 0800  piperacillin-tazobactam (ZOSYN) IVPB 3.375 g        3.375 g 12.5 mL/hr over 240 Minutes Intravenous Every 8 hours 03/31/21 2243  03/31/21 2245  clindamycin (CLEOCIN) IVPB 600 mg  Status:  Discontinued        600 mg 100 mL/hr over 30 Minutes Intravenous Every 8 hours 03/31/21 2231 04/03/21 1412   03/31/21 2245  vancomycin (VANCOREADY) IVPB 2000 mg/400 mL        2,000 mg 200 mL/hr over 120 Minutes Intravenous STAT 03/31/21 2236 04/01/21 0747   03/31/21 2115  piperacillin-tazobactam (ZOSYN) IVPB 3.375 g        3.375 g 100 mL/hr over 30 Minutes Intravenous  Once 03/31/21 2113 03/31/21 2321   03/31/21 2100  piperacillin-tazobactam (ZOSYN) IVPB 3.375 g  Status:  Discontinued        3.375 g 100 mL/hr over 30 Minutes Intravenous  Once 03/31/21 2056 03/31/21 2058   03/31/21 2100  piperacillin-tazobactam (ZOSYN) IVPB 4.5 g  Status:  Discontinued        4.5 g 200 mL/hr over 30 Minutes  Intravenous  Once 03/31/21 2058 03/31/21 2112       Assessment/Plan IDDM AKI on CKD stage III HTN Prostate cancer s/p seeds Recent COVID infection 2+ weeks ago with resolution in symptoms   Perirectal abscess and rectal ulceration  S/P EUA, I&D, placement of seton 04/01/21 Dr. Marlou Starks  - POD#5 -seton in place without significant cellulitis  - sitz baths/shower 3-4x daily or more often if needed with bowel function  - do not re-pack incision  - cx with pan sensitive E. Coli - ID recommending augmentin on discharge, I would recommend a total of 5 days abx - pt continues to have significant stool incontinence and pain - increased fiber and scheduling lomotil to see if this helps. Pt's wife concerned about discharge home with incontinence and weakness, will ask TOC to explore SNF options for rehab although pt would prefer to go home.  - monitor today and see if changes in bowel regimen help with stool incontinence   - At some point patient would benefit from attempted fistula repair in 6-12 weeks at the soonest.  Need time for the infection to resolve, wound closed wound, fistula to mature.  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

## 2021-04-05 NOTE — Progress Notes (Signed)
PROGRESS NOTE  [name] [id number] DOB: 08-06-1946 DOA: 03/31/2021 PCP: [facility]  HPI/Recap of past 24 hours: History of hypertension, diabetes, CVA in 2015,CKD 3A, anemia of chronic disease, gout, OSA, prostate cancer status post seeds placed in December, previous perirectal abscess without fistula, with c/o 5 day h/o rectal pain, associated scrotal pain and swelling , diarrhea ,was at urologist office who noted fever 101.4, sent to ED.  WBC 16.1k. Creat 2.5 up from 1.6 baseline, blood glucose 55,Lactate nl, no hypotension CT A/P: soft tissue gas involving L and midline of perineum extending to posterior base of penis. No definate connection to anorectum. Concern for necrotizing fasciitis.  04/05/21: Patient was seen and examined at his bedside.  Reports his chronic pain is mild.  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 -Patient status post incision and drainage of perirectal abscess with anal fistula and placement of seton per Dr. Marlou Starks 04/01/2021. -MRSA PCR negative. -Blood cultures with no growth to date. -Urine cultures negative. -Wound cultures with few gram-negative rods, few gram-positive cocci, susceptibilities pending. -Patient seen in consultation by ID who recommended discontinuation of IV clindamycin, continuation of IV Zosyn while in-house with transition to Augmentin x5 more days on  discharge.   -Appreciate ID input and recommendations  -Per general surgery and urology.    2.resolved postop delirium -Patient noted to have an episode of confusion on the night of 04/01/2021 which has since resolved. Patient is alert and noted x3 at the time of this visit.  3.  Insulin-dependent type 2 diabetes mellitus presented with hypoglycemia/history of peripheral neuropathy -Patient noted on admission to have a blood glucose level of 55 initially on presentation received D10 in the ED and briefly on D5 half-normal saline. -Hemoglobin A1c 6.5 (04/01/2021) -CBG on 107 this morning  -Decrease Lantus dose to 15 units daily.  Continue SSI.  4.    Resolving acute kidney injury on chronic kidney disease stage IIIa -Creatinine noted to be 2.58 on presentation. Creatinine downtrending 1.24 from 1.40.  GFR greater than 60. He appears to be at his baseline creatinine 1.2 with GFR greater than 60. -Likely secondary to prerenal azotemia in the setting of ACE inhibitor. -Urinalysis nitrite negative, leukocytes negative, negative protein. -Renal function improving daily and creatinine down to 1.40.  -Continue to hold ACE inhibitor. -Outpatient follow-up.  5.  Anemia of chronic disease. -Likely anemia of chronic disease. -Hemoglobin uptrending 9.2 from 8.5. -Transfusion threshold hemoglobin  < 7.   6.  Hypertension BP is currently not at goal, elevated. -Continue Coreg. -Increase back to home regimen of Norvasc 10 mg daily.  -Continue to hold ACE inhibitor secondary to acute kidney injury.  -Outpatient follow-up.  7.  History of prostate cancer status post seeds -Per urology.  8.  Recent COVID-19 infection -Patient noted to have tested positive with COVID 2 weeks prior to admission currently asymptomatic. -Repeat testing on admission remains positive. -Patient not hypoxic. -Chest x-ray with  no acute findings. -Currently not requiring airborne precautions.   DVT  prophylaxis: SCDs/subcu Lovenox daily. Code Status: Full Family Communication:  None at bedside.  Will call wife and update. Disposition:   Status is: Inpatient    Dispo: The patient is from: Home  Anticipated d/c is to: Home with home health if patient and family agree.  Patient currently on IV antibiotics, being treated for perirectal abscess, hopefully discharge tomorrow.               Difficult to place patient no       Consultants:   Urology: Dr. Lovena Neighbours 04/01/2021  General surgery: Dr. Marlou Starks 04/01/2021  Infectious disease: Dr.Comer 04/03/2021  Procedures:  CT abdomen and pelvis 03/31/2021  Chest x-ray 03/31/2021  Incision and drainage perirectal abscess with anal fistula, placement of seton per Dr. Marlou Starks 04/01/2021  Antimicrobials:   IV Zosyn 03/31/2021>>>>  IV clindamycin 03/31/2021>>>> 04/03/2021  IV vancomycin 5/13/ 2022x1 dose     Objective: Vitals:   04/04/21 1726 04/04/21 2202 04/05/21 0156 04/05/21 0506  BP: (!) 180/76 (!) 176/90 (!) 158/71 (!) 174/72  Pulse: 72 86 70 66  Resp: 18 18 17 15   Temp: (!) 97.5 F (36.4 C) 98 F (36.7 C) 98.5 F (36.9 C) (!) 97.4 F (36.3 C)  TempSrc: Oral Oral Oral Oral  SpO2: 99% 100% 99% 100%  Weight:      Height:        Intake/Output Summary (Last 24 hours) at 04/05/2021 1330 Last data filed at 04/05/2021 1000 Gross per 24 hour  Intake 1822.02 ml  Output 1890 ml  Net -67.98 ml   Filed Weights   03/31/21 1521 04/01/21 0500  Weight: 100.7 kg 103.9 kg    Exam:  . General: 75 y.o. year-old male well developed well nourished in no acute distress.  Alert and oriented x3. . Cardiovascular: Regular rate and rhythm with no rubs or gallops.  No thyromegaly or JVD noted.   Marland Kitchen Respiratory: Clear to auscultation with no wheezes or rales. Good inspiratory effort. . Abdomen: Soft nontender nondistended with normal bowel sounds x4 quadrants. . Musculoskeletal: No lower extremity edema. 2/4  pulses in all 4 extremities. . Skin: Scrotum mildly erythematous and edematous. Marland Kitchen Psychiatry: Mood is appropriate for condition and setting   Data Reviewed: CBC: Recent Labs  Lab 03/31/21 1800 04/01/21 0706 04/02/21 0447 04/03/21 0840 04/04/21 0408 04/05/21 0415  WBC 16.1* 11.4* 11.7* 8.5 7.2 7.1  NEUTROABS 12.7*  --  10.1* 5.6 4.6 4.0  HGB 9.8* 8.3* 7.7* 7.9* 8.5* 9.2*  HCT 30.6* 26.0* 24.3* 24.8* 27.0* 30.1*  MCV 86.7 85.2 86.2 87.6 87.7 88.0  PLT 242 266 279 287 305 623   Basic Metabolic Panel: Recent Labs  Lab 04/01/21 0706 04/02/21 0447 04/03/21 0428 04/03/21 0840 04/04/21 0408 04/05/21 0415  NA 133* 137 138  --  140 139  K 4.1 4.4 4.3  --  4.1 3.8  CL 103 107 107  --  107 107  CO2 22 21* 24  --  25 24  GLUCOSE 133* 251* 206*  --  118* 110*  BUN 33* 35* 36*  --  26* 20  CREATININE 2.04* 2.02* 1.75*  --  1.40* 1.24  CALCIUM 8.3* 8.7* 8.5*  --  8.4* 8.8*  MG  --   --   --  2.4  --   --    GFR: Estimated Creatinine Clearance: 62.2 mL/min (by C-G formula based on SCr of 1.24 mg/dL). Liver Function Tests:  Recent Labs  Lab 03/31/21 1800 04/01/21 0706  AST 33 23  ALT 26 20  ALKPHOS 134* 104  BILITOT 0.5 0.6  PROT 8.4* 6.6  ALBUMIN 3.7 2.7*   No results for input(s): LIPASE, AMYLASE in the last 168 hours. No results for input(s): AMMONIA in the last 168 hours. Coagulation Profile: Recent Labs  Lab 03/31/21 1800  INR 1.1   Cardiac Enzymes: No results for input(s): CKTOTAL, CKMB, CKMBINDEX, TROPONINI in the last 168 hours. BNP (last 3 results) No results for input(s): PROBNP in the last 8760 hours. HbA1C: No results for input(s): HGBA1C in the last 72 hours. CBG: Recent Labs  Lab 04/04/21 1937 04/04/21 2358 04/05/21 0402 04/05/21 0743 04/05/21 1136  GLUCAP 173* 133* 107* 96 137*   Lipid Profile: No results for input(s): CHOL, HDL, LDLCALC, TRIG, CHOLHDL, LDLDIRECT in the last 72 hours. Thyroid Function Tests: No results for input(s): TSH,  T4TOTAL, FREET4, T3FREE, THYROIDAB in the last 72 hours. Anemia Panel: Recent Labs    04/04/21 0408  VITAMINB12 661  FOLATE 7.8  FERRITIN 523*  TIBC 180*  IRON 22*   Urine analysis:    Component Value Date/Time   COLORURINE YELLOW 04/01/2021 1603   APPEARANCEUR CLEAR 04/01/2021 1603   LABSPEC 1.014 04/01/2021 1603   PHURINE 5.0 04/01/2021 1603   GLUCOSEU NEGATIVE 04/01/2021 1603   HGBUR NEGATIVE 04/01/2021 1603   [place] 04/01/2021 1603   KETONESUR NEGATIVE 04/01/2021 1603   PROTEINUR NEGATIVE 04/01/2021 1603   UROBILINOGEN 0.2 09/17/2012 1837   NITRITE NEGATIVE 04/01/2021 1603   LEUKOCYTESUR NEGATIVE 04/01/2021 1603   Sepsis Labs: @LABRCNTIP (procalcitonin:4,lacticidven:4)  ) Recent Results (from the past 240 hour(s))  Blood culture (routine single)     Status: None   Collection Time: 03/31/21  6:00 PM   Specimen: BLOOD  Result Value Ref Range Status   Specimen Description   Final    BLOOD BLOOD LEFT HAND Performed at [facility], [place] [address], [place], [place] [address]    Special Requests   Final    BOTTLES DRAWN AEROBIC AND ANAEROBIC Blood Culture adequate volume Performed at [facility] [address] [place], [place] [address]    Culture   Final    NO GROWTH 5 DAYS Performed at [facility], [place] [address], [place], [place] [address]    Report Status 04/05/2021 FINAL  Final  Resp Panel by RT-PCR (Flu A&B, Covid) Nasopharyngeal Swab     Status: Abnormal   Collection Time: 03/31/21  7:52 PM   Specimen: Nasopharyngeal Swab; Nasopharyngeal(NP) swabs in vial transport medium  Result Value Ref Range Status   SARS Coronavirus 2 by RT PCR POSITIVE (A) NEGATIVE Final    Comment: RESULT CALLED TO, READ BACK BY AND VERIFIED WITH: BANNO,A 03/31/21 @2147  BY SEELMOL (NOTE) SARS-CoV-2 target nucleic acids are DETECTED.  The SARS-CoV-2 RNA is generally detectable in upper respiratory specimens during the  acute phase of infection. Positive results are indicative of the presence of the identified virus, but do not rule out bacterial infection or co-infection with other pathogens not detected by the test. Clinical correlation with patient history and other diagnostic information is necessary to determine patient infection status. The expected result is Negative.  Fact Sheet for Patients: EntrepreneurPulse.com.au  Fact Sheet for Healthcare Providers: IncredibleEmployment.be  This test is not yet approved or cleared by the [place] FDA and  has been authorized for detection and/or diagnosis of SARS-CoV-2 by FDA under an Emergency  Use Authorization (EUA).  This EUA will remain in effect (meaning this test can be  used) for the duration of  the COVID-19 declaration under Section 564(b)(1) of the Act, 21 U.S.C. section 360bbb-3(b)(1), unless the authorization is terminated or revoked sooner.     Influenza A by PCR NEGATIVE NEGATIVE Final   Influenza B by PCR NEGATIVE NEGATIVE Final    Comment: (NOTE) The Xpert Xpress SARS-CoV-2/FLU/RSV plus assay is intended as an aid in the diagnosis of influenza from Nasopharyngeal swab specimens and should not be used as a sole basis for treatment. Nasal washings and aspirates are unacceptable for Xpert Xpress SARS-CoV-2/FLU/RSV testing.  Fact Sheet for Patients: EntrepreneurPulse.com.au  Fact Sheet for Healthcare Providers: IncredibleEmployment.be  This test is not yet approved or cleared by the [place] FDA and has been authorized for detection and/or diagnosis of SARS-CoV-2 by FDA under an Emergency Use Authorization (EUA). This EUA will remain in effect (meaning this test can be used) for the duration of the COVID-19 declaration under Section 564(b)(1) of the Act, 21 U.S.C. section 360bbb-3(b)(1), unless the authorization is terminated or revoked.  Performed at  [facility], [place] [address] [place], [place] [address]   MRSA PCR Screening     Status: None   Collection Time: 04/01/21  3:45 AM   Specimen: Nasal Mucosa; Nasopharyngeal  Result Value Ref Range Status   MRSA by PCR NEGATIVE NEGATIVE Final    Comment:        The GeneXpert MRSA Assay (FDA approved for NASAL specimens only), is one component of a comprehensive MRSA colonization surveillance program. It is not intended to diagnose MRSA infection nor to guide or monitor treatment for MRSA infections. Performed at [facility], [place] [address] [place], [place] [address]   Culture, blood (Routine X 2) w Reflex to ID Panel     Status: None (Preliminary result)   Collection Time: 04/01/21  7:06 AM   Specimen: BLOOD  Result Value Ref Range Status   Specimen Description   Final    BLOOD LEFT HAND Performed at [place] [address], [place], [place] [address]    Special Requests   Final    BOTTLES DRAWN AEROBIC ONLY Blood Culture adequate volume Performed at [place] [address], [place], [place] [address]    Culture   Final    NO GROWTH 4 DAYS Performed at [facility], [place] [address] [place], [place] [address]    Report Status PENDING  Incomplete  Aerobic/Anaerobic Culture w Gram Stain (surgical/deep wound)     Status: None   Collection Time: 04/01/21 10:29 AM   Specimen: PATH Other; Tissue  Result Value Ref Range Status   Specimen Description   Final    PERINEAL ABCESS Performed at [place] [address], [place], [place] [address]    Special Requests   Final    NONE Performed at [facility], [place] [address] [place], [place] [address]    Gram Stain   Final    MODERATE WBC PRESENT, PREDOMINANTLY PMN FEW GRAM POSITIVE COCCI FEW GRAM NEGATIVE RODS    Culture   Final    FEW ESCHERICHIA COLI FEW BACTEROIDES FRAGILIS BETA LACTAMASE  POSITIVE Performed at [facility], [place] [address], [place],  [address]    Report Status 04/04/2021 FINAL  Final   Organism ID, Bacteria ESCHERICHIA COLI  Final  Susceptibility   Escherichia coli - MIC*    AMPICILLIN <=2 SENSITIVE Sensitive     CEFAZOLIN <=4 SENSITIVE Sensitive     CEFEPIME <=0.12 SENSITIVE Sensitive     CEFTAZIDIME <=1 SENSITIVE Sensitive     CEFTRIAXONE <=0.25 SENSITIVE Sensitive     CIPROFLOXACIN <=0.25 SENSITIVE Sensitive     GENTAMICIN <=1 SENSITIVE Sensitive     IMIPENEM <=0.25 SENSITIVE Sensitive     TRIMETH/SULFA <=20 SENSITIVE Sensitive     AMPICILLIN/SULBACTAM <=2 SENSITIVE Sensitive     PIP/TAZO <=4 SENSITIVE Sensitive     * FEW ESCHERICHIA COLI  Culture, Urine     Status: None   Collection Time: 04/01/21  4:03 PM   Specimen: Urine, Clean Catch  Result Value Ref Range Status   Specimen Description   Final    URINE, CLEAN CATCH Performed at [facility], [place] [address], [place], [place] [address]    Special Requests   Final    NONE Performed at [facility], [place] [address], [place], [place] [address]    Culture   Final    NO GROWTH Performed at [facility], [place] [address] [place], [place] [address]    Report Status 04/02/2021 FINAL  Final      Studies: No results found.  Scheduled Meds: . (feeding supplement) PROSource Plus  30 mL Oral TID BM  . acetaminophen  500 mg Oral Q6H  . amitriptyline  50 mg Oral QHS  . amLODipine  10 mg Oral Daily  . atorvastatin  20 mg Oral QHS  . buPROPion  150 mg Oral Daily  . carvedilol  3.125 mg Oral BID WC  . Chlorhexidine Gluconate Cloth  6 each Topical Daily  . diphenoxylate-atropine  1 tablet Oral BID  . febuxostat  40 mg Oral QHS  . ferrous sulfate  325 mg Oral BID WC  . gabapentin  600 mg Oral QHS  . insulin aspart  0-9 Units Subcutaneous Q4H  . insulin glargine  15 Units Subcutaneous QHS  . mouth rinse  15 mL Mouth Rinse BID  .  multivitamin with minerals  1 tablet Oral Daily  . polycarbophil  625 mg Oral BID  . sodium chloride flush  10-40 mL Intracatheter Q12H  . tamsulosin  0.4 mg Oral QPC supper  . tetrahydrozoline  3 drop Both Eyes Daily  . vitamin B-12  1,000 mcg Oral Daily    Continuous Infusions: . piperacillin-tazobactam (ZOSYN)  IV 3.375 g (04/05/21 0918)     LOS: 5 days     Kayleen Memos, MD [facility] Pager [phone]  If 7PM-7AM, please contact night-coverage [email or web address] Password [email or web address] 04/05/2021, 1:30 PM

## 2021-04-05 NOTE — TOC Progression Note (Signed)
Transition of Care [facility]) - Progression Note    Patient Details  Name: EYAN HAGOOD MRN: [id number] Date of Birth: 21-Aug-1946  Transition of Care [facility]) CM/SW Contact  Lennart Pall, LCSW Phone Number: 04/05/2021, 1:45 PM  Clinical Narrative:    Met with pt and wife today to discuss dc planning needs.  Both very pleasant and engaged as we talked about wife's concerns of managing pt's current care needs in the home.  Wife feels that short term SNF rehab is needed, however, pt hopeful he might progress enough here to dc directly home.  Pt is agreeable to SNF if he does not progress as hoped. We talked through the process of SN[facility] placement under [facility] and in a [place] contracted bed.  Explained that finding a SNF bed this way could take a prolonged period of time and they should anticipate the facility would not be in this area.  Have, also, explained option to go to SNF under his [facility] Medicare coverage which is quicker, likely a local facility, but there would be some out of pocket costs.  At this point, they would like for me to pursue SNF under both avenues but they would also like for me to contact the [place] and alert the [facility] MD to their plan. Have left a VM for the SW at the [facility].  Pt's primary MD at [facility] is Dr. Bernell List.  Will begin working case up for SNF and keep pt/wife informed of any conversations, info gathered from [place].   Expected Discharge Plan: Home/Self Care Barriers to Discharge: Continued Medical Work up  Expected Discharge Plan and Services Expected Discharge Plan: Home/Self Care   Discharge Planning Services: CM Consult   Living arrangements for the past 2 months: Single Family Home                                       Social Determinants of Health (SDOH) Interventions    Readmission Risk Interventions No flowsheet data found.

## 2021-04-06 DIAGNOSIS — N493 Fournier gangrene: Secondary | ICD-10-CM | POA: Diagnosis not present

## 2021-04-06 LAB — GLUCOSE, CAPILLARY
Glucose-Capillary: 103 mg/dL — ABNORMAL HIGH (ref 70–99)
Glucose-Capillary: 107 mg/dL — ABNORMAL HIGH (ref 70–99)
Glucose-Capillary: 109 mg/dL — ABNORMAL HIGH (ref 70–99)
Glucose-Capillary: 115 mg/dL — ABNORMAL HIGH (ref 70–99)
Glucose-Capillary: 129 mg/dL — ABNORMAL HIGH (ref 70–99)
Glucose-Capillary: 89 mg/dL (ref 70–99)

## 2021-04-06 LAB — CULTURE, BLOOD (ROUTINE X 2)
Culture: NO GROWTH
Special Requests: ADEQUATE

## 2021-04-06 MED ORDER — AMOXICILLIN-POT CLAVULANATE 875-125 MG PO TABS
1.0000 | ORAL_TABLET | Freq: Two times a day (BID) | ORAL | Status: DC
  Administered 2021-04-06: 1 via ORAL
  Filled 2021-04-06: qty 1

## 2021-04-06 MED ORDER — FERROUS SULFATE 325 (65 FE) MG PO TABS
325.0000 mg | ORAL_TABLET | Freq: Three times a day (TID) | ORAL | Status: DC
  Administered 2021-04-06 – 2021-04-11 (×15): 325 mg via ORAL
  Filled 2021-04-06 (×15): qty 1

## 2021-04-06 MED ORDER — DIPHENOXYLATE-ATROPINE 2.5-0.025 MG PO TABS
2.0000 | ORAL_TABLET | Freq: Two times a day (BID) | ORAL | Status: DC
  Administered 2021-04-06 – 2021-04-11 (×10): 2 via ORAL
  Filled 2021-04-06 (×10): qty 2

## 2021-04-06 NOTE — TOC Progression Note (Signed)
Transition of Care [facility]) - Progression Note    Patient Details  Name: [name] MRN: [id number] Date of Birth: [birth year]  Transition of Care [facility]) CM/SW Contact  Lennart Pall, LCSW Phone Number: 04/06/2021, 3:26 PM  Clinical Narrative:    Met again with pt and wife today to discuss dc readiness.  Pt feels he is making progress and remains hopeful to avoid SNF.  Wife remains very concerned about his bowel incontinence and pain management needs.  PT and OT have both worked with him today and confirm good progress with ADL and mobility and are recommending [place] follow up.   Pt and wife report that their home is under remodeling construction and cannot live there in it's current state.  Their only option is to stay in a motel but they are requesting that I submit request to [facility] for short term SNF coverage.  MD aware and in agreement with this.  All required documentation has been faxed and await approval/ denial.   Expected Discharge Plan: Home/Self Care Barriers to Discharge: Other (must enter comment) (await [facility] decision on short term SNF coverage; pt and wife without a home to return to (remodeling))  Expected Discharge Plan and Services Expected Discharge Plan: Home/Self Care   Discharge Planning Services: CM Consult   Living arrangements for the past 2 months: Single Family Home Expected Discharge Date: 04/06/21                                     Social Determinants of Health (SDOH) Interventions    Readmission Risk Interventions No flowsheet data found.

## 2021-04-06 NOTE — Progress Notes (Addendum)
Occupational Therapy Treatment Patient Details Name: [name] MRN: [id number] DOB: [birth year] Today's Date: 04/06/2021    History of present illness Patient admitted 03/31/2021 for perirectal abcess.  S/PEXAM UNDER ANESTHESIA, INCISION AND DRAINAGE PERIRECTAL ABSCESS WITH ANAL FISTULA, PLACEMENT OF SETON on %/14. PMH: TKA, THA, stroke, peripheral neuropathy.   OT comments  Treatment focused on activity tolerance and ADLs. Patient stood for a total of 10 minutes to perform grooming tasks at sink and washing hair over sink. Patient able to manage socks and don brief without physical assistance. Patient mildly unsteady with initial stand but does well with RW. Patient progressing well with goals. Therapist continues to recommend current POC - home with wife as patient doing well with ADLs. Recommended patient use walker at home initially.   Follow Up Recommendations  No OT follow up;Supervision - Intermittent    Equipment Recommendations       Recommendations for Other Services      Precautions / Restrictions Precautions Precautions: Fall Restrictions Weight Bearing Restrictions: No       Mobility Bed Mobility Overal bed mobility: Modified Independent                  Transfers Overall transfer level: Needs assistance Equipment used: Rolling walker (2 wheeled) Transfers: Sit to/from Stand Sit to Stand: Min guard         General transfer comment: min guard for ambulation in room with RW. Slightly unsteady with initial stand. reports a fall in his past. Does well with RW.    Balance Overall balance assessment: Mild deficits observed, not formally tested                                         ADL either performed or assessed with clinical judgement   ADL       Grooming: Standing;Wash/dry hands;Wash/dry face;Brushing hair               Lower Body Dressing: Set up;Sit to/from stand;Min guard Lower Body Dressing Details (indicate cue  type and reason): able to pull up socks. donned brief with increased time but no physical assistance. use of walker with standing.               General ADL Comments: stood at sink to wash hair with therapist managing water and soap due to difficulty from environment. Patient stood for a total of 10 minutes during ADLs.     Vision Baseline Vision/History: Wears glasses Wears Glasses: Reading only     Perception     Praxis      Cognition Arousal/Alertness: Awake/alert Behavior During Therapy: WFL for tasks assessed/performed Overall Cognitive Status: Within Functional Limits for tasks assessed                                          Exercises     Shoulder Instructions       General Comments      Pertinent Vitals/ Pain       Pain Assessment: Faces Faces Pain Scale: Hurts a little bit Pain Location: rerirectal area Pain Descriptors / Indicators: Discomfort Pain Intervention(s): Repositioned  Home Living  Prior Functioning/Environment              Frequency  Min 2X/week        Progress Toward Goals  OT Goals(current goals can now be found in the care plan section)  Progress towards OT goals: Progressing toward goals  Acute Rehab OT Goals Patient Stated Goal: to go home OT Goal Formulation: With patient Time For Goal Achievement: 04/18/21 Potential to Achieve Goals: Good  Plan Discharge plan remains appropriate    Co-evaluation          OT goals addressed during session: ADL's and self-care      AM-PAC OT "6 Clicks" Daily Activity     Outcome Measure   Help from another person eating meals?: None Help from another person taking care of personal grooming?: A Little Help from another person toileting, which includes using toliet, bedpan, or urinal?: A Little Help from another person bathing (including washing, rinsing, drying)?: A Little Help from another person  to put on and taking off regular upper body clothing?: A Little Help from another person to put on and taking off regular lower body clothing?: A Little 6 Click Score: 19    End of Session Equipment Utilized During Treatment: Rolling walker  OT Visit Diagnosis: Other abnormalities of gait and mobility (R26.89);Pain   Activity Tolerance Patient tolerated treatment well   Patient Left in chair;with call bell/phone within reach   Nurse Communication Mobility status        Time: 7893-8101 OT Time Calculation (min): 34 min  Charges: OT General Charges $OT Visit: 1 Visit OT Treatments $Self Care/Home Management : 23-37 mins  David Rodriquez, OTR/L [place]  Office [phone] Pager: [place] 04/06/2021, 9:51 AM

## 2021-04-06 NOTE — Progress Notes (Signed)
PROGRESS NOTE  RENOLD SUITE [id number] DOB: 1946-07-14 DOA: 03/31/2021 PCP: [facility]  HPI/Recap of past 24 hours: History of hypertension, type 2 diabetes, CVA in 2015, CKD 3A, anemia of chronic disease, gout, OSA, prostate cancer status post seeds placement in December 2021, previous perirectal abscess without fistula, with c/o 5 day h/o rectal pain, associated with scrotal pain and swelling, diarrhea.  Was at urologist office who noted fever 101.4, sent to ED.  Work-up in the ED revealed seen on CT A/P: soft tissue gas involving L and midline of perineum extending to posterior base of penis. No definate connection to anorectum. Concern for necrotizing fasciitis.  General surgery was consulted.  Status post I&D perirectal abscess with anal fistula and placement of seton by general surgery Dr. Marlou Starks  04/06/21: Patient was seen and examined at bedside.  Still has significant pain perirectally requiring IV pain medications.  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 -Post incision and drainage of perirectal abscess with anal fistula and placement of seton per Dr. Marlou Starks 04/01/2021. -MRSA PCR negative. -Blood cultures with no growth to date. -Urine cultures negative. -Wound cultures with few gram-negative rods, few gram-positive cocci, susceptibilities, pansensitive. -Patient seen in consultation by ID who  recommended discontinuation of IV clindamycin, continuation of IV Zosyn while in-house with transition to Augmentin x5 more days on discharge.   -Start Augmentin x5 days as recommended by ID. -Appreciate ID input and recommendations  -Appreciate general surgery and urology consultative services. Continue pain control, IV Dilaudid for severe pain, Vicodin for moderate pain.  Resolved postop delirium Currently alert and oriented x3. Reorient if needed  Insulin-dependent type 2 diabetes mellitus presented with hypoglycemia/history of peripheral neuropathy -Patient noted on admission to have a blood glucose level of 55 initially on presentation received D10 in the ED and briefly on D5 half-normal saline. -Hemoglobin A1c 6.5 (04/01/2021) -CBG on 107 this morning  -Decrease Lantus dose to 15 units daily.  Continue SSI.  Resolving acute kidney injury on chronic kidney disease stage IIIa -Creatinine noted to be 2.58 on presentation. Creatinine downtrending 1.24 from 1.40.  GFR greater than 60. He appears to be at his baseline creatinine 1.2 with GFR greater than 60. -Likely secondary to prerenal azotemia in the setting of ACE inhibitor. -Urinalysis nitrite negative, leukocytes negative, negative protein. -Renal function improving daily and creatinine down to 1.40.  -Continue to hold ACE inhibitor. -Outpatient follow-up.  Anemia of chronic disease. -Likely anemia of chronic disease. -Hemoglobin uptrending 9.2 from 8.5. -Transfusion threshold hemoglobin  < 7.   Hypertension BP is currently not at goal, elevated. -Continue Coreg. -Continue Norvasc 10 mg daily.  -Continue to hold ACE inhibitor secondary to acute kidney injury.  -Outpatient follow-up.  History of prostate cancer status post seeds -Management Per urology.  Recent COVID-19 infection -Patient noted to have tested positive with COVID 2 weeks prior to admission currently asymptomatic. -Repeat testing on admission  remains positive. -Patient not hypoxic. -Chest  x-ray with no acute findings. -Currently not requiring airborne precautions.   DVT prophylaxis: SCDs/subcu Lovenox daily. Code Status: Full Family Communication:  Updated his wife via phone on 04/06/2021. Disposition:   Status is: Inpatient    Dispo: The patient is from: Home  Anticipated d/c is to:  Home with home health PT.  Patient currently being treated for perirectal abscess, hopefully discharge in the next 24 to 48 hours.               Difficult to place patient not applicable.       Consultants:   Urology: Dr. Lovena Neighbours 04/01/2021  General surgery: Dr. Marlou Starks 04/01/2021  Infectious disease: Dr.Comer 04/03/2021  Procedures:  CT abdomen and pelvis 03/31/2021  Chest x-ray 03/31/2021  Incision and drainage perirectal abscess with anal fistula, placement of seton per Dr. Marlou Starks 04/01/2021  Antimicrobials:   IV Zosyn 03/31/2021>>>> 04/06/2021  IV clindamycin 03/31/2021>>>> 04/03/2021  IV vancomycin 5/13/ 2022x1 dose  Augmentin 04/06/2021 x5 days     Objective: Vitals:   04/05/21 1948 04/05/21 2126 04/06/21 0601 04/06/21 1344  BP:  (!) 154/72 (!) 158/54 (!) 149/67  Pulse: 67 68 68 76  Resp: 18 15 15 18   Temp:  98.6 F (37 C) 98.4 F (36.9 C) 98.6 F (37 C)  TempSrc:  Oral Oral Oral  SpO2: 99% 98% 97% 99%  Weight:      Height:        Intake/Output Summary (Last 24 hours) at 04/06/2021 1624 Last data filed at 04/06/2021 1400 Gross per 24 hour  Intake 890.01 ml  Output 1875 ml  Net -984.99 ml   Filed Weights   03/31/21 1521 04/01/21 0500  Weight: 100.7 kg 103.9 kg    Exam:  . General: 75 y.o. year-old male well-appearing no acute distress.  He is alert and oriented x3.   . Cardiovascular: Regular rate and rhythm no rubs or gallops.  Marland Kitchen Respiratory: Clear to auscultation no wheezes or rales.   . Abdomen: Soft nontender normal bowel sounds present.  . Musculoskeletal: No  lower extremity edema bilaterally. . Skin: Scrotum edematous and erythematous.   Marland Kitchen Psychiatry: Mood is appropriate for condition and setting.   Data Reviewed: CBC: Recent Labs  Lab 03/31/21 1800 04/01/21 0706 04/02/21 0447 04/03/21 0840 04/04/21 0408 04/05/21 0415  WBC 16.1* 11.4* 11.7* 8.5 7.2 7.1  NEUTROABS 12.7*  --  10.1* 5.6 4.6 4.0  HGB 9.8* 8.3* 7.7* 7.9* 8.5* 9.2*  HCT 30.6* 26.0* 24.3* 24.8* 27.0* 30.1*  MCV 86.7 85.2 86.2 87.6 87.7 88.0  PLT 242 266 279 287 305 [id number]   Basic Metabolic Panel: Recent Labs  Lab 04/01/21 0706 04/02/21 0447 04/03/21 0428 04/03/21 0840 04/04/21 0408 04/05/21 0415  NA 133* 137 138  --  140 139  K 4.1 4.4 4.3  --  4.1 3.8  CL 103 107 107  --  107 107  CO2 22 21* 24  --  25 24  GLUCOSE 133* 251* 206*  --  118* 110*  BUN 33* 35* 36*  --  26* 20  CREATININE 2.04* 2.02* 1.75*  --  1.40* 1.24  CALCIUM 8.3* 8.7* 8.5*  --  8.4* 8.8*  MG  --   --   --  2.4  --   --    GFR: Estimated Creatinine Clearance: 62.2 mL/min (by C-G formula based on SCr of 1.24 mg/dL). Liver Function Tests: Recent Labs  Lab 03/31/21 1800 04/01/21 0706  AST 33 23  ALT 26 20  ALKPHOS 134* 104  BILITOT 0.5 0.6  PROT 8.4* 6.6  ALBUMIN 3.7 2.7*   No results for input(s): LIPASE, AMYLASE in the last 168 hours. No results for input(s): AMMONIA in the last 168 hours. Coagulation Profile: Recent Labs  Lab 03/31/21 1800  INR 1.1   Cardiac Enzymes: No results for input(s): CKTOTAL, CKMB, CKMBINDEX, TROPONINI in the last 168 hours. BNP (last 3 results) No results for input(s): PROBNP in the last 8760 hours. HbA1C: No results for input(s): HGBA1C in the last 72 hours. CBG: Recent Labs  Lab 04/05/21 1956 04/06/21 0006 04/06/21 0405 04/06/21 0729 04/06/21 1147  GLUCAP 170* 103* 89 107* 115*   Lipid Profile: No results for input(s): CHOL, HDL, LDLCALC, TRIG, CHOLHDL, LDLDIRECT in the last 72 hours. Thyroid Function Tests: No results for input(s): TSH,  T4TOTAL, FREET4, T3FREE, THYROIDAB in the last 72 hours. Anemia Panel: Recent Labs    04/04/21 0408  VITAMINB12 661  FOLATE 7.8  FERRITIN 523*  TIBC 180*  IRON 22*   Urine analysis:    Component Value Date/Time   COLORURINE YELLOW 04/01/2021 1603   APPEARANCEUR CLEAR 04/01/2021 1603   LABSPEC 1.014 04/01/2021 1603   PHURINE 5.0 04/01/2021 1603   GLUCOSEU NEGATIVE 04/01/2021 1603   HGBUR NEGATIVE 04/01/2021 1603   [place] 04/01/2021 1603   KETONESUR NEGATIVE 04/01/2021 1603   PROTEINUR NEGATIVE 04/01/2021 1603   UROBILINOGEN 0.2 09/17/2012 1837   NITRITE NEGATIVE 04/01/2021 1603   LEUKOCYTESUR NEGATIVE 04/01/2021 1603   Sepsis Labs: @LABRCNTIP (procalcitonin:4,lacticidven:4)  ) Recent Results (from the past 240 hour(s))  Blood culture (routine single)     Status: None   Collection Time: 03/31/21  6:00 PM   Specimen: BLOOD  Result Value Ref Range Status   Specimen Description   Final    BLOOD BLOOD LEFT HAND Performed at [facility], [place] [address] [place], [place] [address]    Special Requests   Final    BOTTLES DRAWN AEROBIC AND ANAEROBIC Blood Culture adequate volume Performed at [facility] [address] [place], [place] [address]    Culture   Final    NO GROWTH 5 DAYS Performed at [facility], [place] [address], [place], [place] [address]    Report Status 04/05/2021 FINAL  Final  Resp Panel by RT-PCR (Flu A&B, Covid) Nasopharyngeal Swab     Status: Abnormal   Collection Time: 03/31/21  7:52 PM   Specimen: Nasopharyngeal Swab; Nasopharyngeal(NP) swabs in vial transport medium  Result Value Ref Range Status   SARS Coronavirus 2 by RT PCR POSITIVE (A) NEGATIVE Final    Comment: RESULT CALLED TO, READ BACK BY AND VERIFIED WITH: BANNO,A 03/31/21 @2147  BY SEELMOL (NOTE) SARS-CoV-2 target nucleic acids are DETECTED.  The SARS-CoV-2 RNA is generally detectable in upper respiratory specimens during the  acute phase of infection. Positive results are indicative of the presence of the identified virus, but do not rule out bacterial infection or co-infection with other pathogens not detected by the test. Clinical correlation with patient history and other diagnostic information is necessary to determine patient infection status. The expected result is Negative.  Fact Sheet for Patients: EntrepreneurPulse.com.au  Fact Sheet for Healthcare Providers: IncredibleEmployment.be  This test is not yet approved or cleared by the [place] FDA and  has been authorized for detection and/or diagnosis of SARS-CoV-2 by FDA under an Emergency Use Authorization (EUA).  This EUA will remain in effect (meaning this test can be  used)  for the duration of  the COVID-19 declaration under Section 564(b)(1) of the Act, 21 U.S.C. section 360bbb-3(b)(1), unless the authorization is terminated or revoked sooner.     Influenza A by PCR NEGATIVE NEGATIVE Final   Influenza B by PCR NEGATIVE NEGATIVE Final    Comment: (NOTE) The Xpert Xpress SARS-CoV-2/FLU/RSV plus assay is intended as an aid in the diagnosis of influenza from Nasopharyngeal swab specimens and should not be used as a sole basis for treatment. Nasal washings and aspirates are unacceptable for Xpert Xpress SARS-CoV-2/FLU/RSV testing.  Fact Sheet for Patients: EntrepreneurPulse.com.au  Fact Sheet for Healthcare Providers: IncredibleEmployment.be  This test is not yet approved or cleared by the [place] FDA and has been authorized for detection and/or diagnosis of SARS-CoV-2 by FDA under an Emergency Use Authorization (EUA). This EUA will remain in effect (meaning this test can be used) for the duration of the COVID-19 declaration under Section 564(b)(1) of the Act, 21 U.S.C. section 360bbb-3(b)(1), unless the authorization is terminated or revoked.  Performed at  [facility], [place] [address] [place], [place] [address]   MRSA PCR Screening     Status: None   Collection Time: 04/01/21  3:45 AM   Specimen: Nasal Mucosa; Nasopharyngeal  Result Value Ref Range Status   MRSA by PCR NEGATIVE NEGATIVE Final    Comment:        The GeneXpert MRSA Assay (FDA approved for NASAL specimens only), is one component of a comprehensive MRSA colonization surveillance program. It is not intended to diagnose MRSA infection nor to guide or monitor treatment for MRSA infections. Performed at [facility], [place] [address] [place], [place] [address]   Culture, blood (Routine X 2) w Reflex to ID Panel     Status: None   Collection Time: 04/01/21  7:06 AM   Specimen: BLOOD  Result Value Ref Range Status   Specimen Description   Final    BLOOD LEFT HAND Performed at [place] [address], [place], [place] [address]    Special Requests   Final    BOTTLES DRAWN AEROBIC ONLY Blood Culture adequate volume Performed at [facility] [address], [place], [place] [address]    Culture   Final    NO GROWTH 5 DAYS Performed at [facility], [place] [address], [place], [place] [address]    Report Status 04/06/2021 FINAL  Final  Aerobic/Anaerobic Culture w Gram Stain (surgical/deep wound)     Status: None   Collection Time: 04/01/21 10:29 AM   Specimen: PATH Other; Tissue  Result Value Ref Range Status   Specimen Description   Final    PERINEAL ABCESS Performed at [facility] [address] [place], [place] [address]    Special Requests   Final    NONE Performed at [facility], [place] [address], [place], [place] [address]    Gram Stain   Final    MODERATE WBC PRESENT, PREDOMINANTLY PMN FEW GRAM POSITIVE COCCI FEW GRAM NEGATIVE RODS    Culture   Final    FEW ESCHERICHIA COLI FEW BACTEROIDES FRAGILIS BETA LACTAMASE  POSITIVE Performed at [facility], [place] [address], [place], [place] [address]    Report Status 04/04/2021 FINAL  Final   Organism ID, Bacteria ESCHERICHIA COLI  Final      Susceptibility   Escherichia coli - MIC*    AMPICILLIN <=2 SENSITIVE Sensitive  CEFAZOLIN <=4 SENSITIVE Sensitive     CEFEPIME <=0.12 SENSITIVE Sensitive     CEFTAZIDIME <=1 SENSITIVE Sensitive     CEFTRIAXONE <=0.25 SENSITIVE Sensitive     CIPROFLOXACIN <=0.25 SENSITIVE Sensitive     GENTAMICIN <=1 SENSITIVE Sensitive     IMIPENEM <=0.25 SENSITIVE Sensitive     TRIMETH/SULFA <=20 SENSITIVE Sensitive     AMPICILLIN/SULBACTAM <=2 SENSITIVE Sensitive     PIP/TAZO <=4 SENSITIVE Sensitive     * FEW ESCHERICHIA COLI  Culture, Urine     Status: None   Collection Time: 04/01/21  4:03 PM   Specimen: Urine, Clean Catch  Result Value Ref Range Status   Specimen Description   Final    URINE, CLEAN CATCH Performed at [facility], [place] [address] [place], [place] [address]    Special Requests   Final    NONE Performed at [facility], [place] [address], [place], [place] [address]    Culture   Final    NO GROWTH Performed at [facility], [place] [address], [place], [place] [address]    Report Status 04/02/2021 FINAL  Final  C Difficile Quick Screen (NO PCR Reflex)     Status: None   Collection Time: 04/05/21  1:25 PM   Specimen: STOOL  Result Value Ref Range Status   C Diff antigen NEGATIVE NEGATIVE Final   C Diff toxin NEGATIVE NEGATIVE Final   C Diff interpretation No C. difficile detected.  Final    Comment: Performed at [facility], [place] [address], [place], [place] [address]      Studies: No results found.  Scheduled Meds: . (feeding supplement) PROSource Plus  30 mL Oral TID BM  . acetaminophen  500 mg Oral Q6H  . amitriptyline  50 mg Oral QHS  . amLODipine  10 mg Oral Daily  . atorvastatin  20 mg Oral QHS  . buPROPion  150 mg Oral  Daily  . carvedilol  3.125 mg Oral BID WC  . Chlorhexidine Gluconate Cloth  6 each Topical Daily  . diphenoxylate-atropine  2 tablet Oral BID  . enoxaparin (LOVENOX) injection  40 mg Subcutaneous Q24H  . febuxostat  40 mg Oral QHS  . ferrous sulfate  325 mg Oral TID WC  . gabapentin  600 mg Oral QHS  . insulin aspart  0-9 Units Subcutaneous Q4H  . insulin glargine  15 Units Subcutaneous QHS  . mouth rinse  15 mL Mouth Rinse BID  . multivitamin with minerals  1 tablet Oral Daily  . polycarbophil  625 mg Oral BID  . sodium chloride flush  10-40 mL Intracatheter Q12H  . tamsulosin  0.4 mg Oral QPC supper  . tetrahydrozoline  3 drop Both Eyes Daily  . vitamin B-12  1,000 mcg Oral Daily    Continuous Infusions:    LOS: 6 days     Kayleen Memos, MD [facility] Pager [phone]  If 7PM-7AM, please contact night-coverage [email or web address] Password [facility] 04/06/2021, 4:24 PM

## 2021-04-06 NOTE — Progress Notes (Signed)
Physical Therapy Treatment Patient Details Name: [name] MRN: [id number] DOB: 1945-11-29 Today's Date: 04/06/2021    History of Present Illness Patient admitted 03/31/2021 for perirectal abcess.  S/PEXAM UNDER ANESTHESIA, INCISION AND DRAINAGE PERIRECTAL ABSCESS WITH ANAL FISTULA, PLACEMENT OF SETON on %/14. PMH: TKA, THA, stroke, peripheral neuropathy.    PT Comments    Pt ambulated in hallway and used urinal in standing upon returning to room.  Pt utilized RW for ambulation for a little more support.  Spouse present and concerned about d/c home.  Discussed pt mobilizing well and tolerated distance.  Pt agreeable to use RW for safety upon d/c.  Spouse reports fall prior to admission however states it was a "collapse" and did not sound related to balance or mechanical in nature.   Follow Up Recommendations  Home health PT     Equipment Recommendations  None recommended by PT    Recommendations for Other Services       Precautions / Restrictions Precautions Precautions: Fall Precaution Comments: prerirectal drain, urgency for BM    Mobility  Bed Mobility               General bed mobility comments: pt in recliner    Transfers Overall transfer level: Needs assistance Equipment used: Rolling walker (2 wheeled) Transfers: Sit to/from Stand           General transfer comment: verbal cues for hand placement; pt does well self assisting  Ambulation/Gait Ambulation/Gait assistance: Min guard Gait Distance (Feet): 400 Feet Assistive device: Rolling walker (2 wheeled) Gait Pattern/deviations: Step-through pattern     General Gait Details: maintained RW for support/stability, no LOB, fatigued end of ambulation   Stairs             Wheelchair Mobility    Modified Rankin (Stroke Patients Only)       Balance           Standing balance support: No upper extremity supported Standing balance-Leahy Scale: Fair Standing balance comment: static  stand without UE support             High level balance activites: Backward walking;Turns;Sudden stops;Head turns High Level Balance Comments: performed above balance activities with RW and no difficulty            Cognition Arousal/Alertness: Awake/alert Behavior During Therapy: WFL for tasks assessed/performed Overall Cognitive Status: Within Functional Limits for tasks assessed                                        Exercises      General Comments        Pertinent Vitals/Pain Pain Assessment: Faces Faces Pain Scale: Hurts a little bit Pain Location: perirectal area Pain Descriptors / Indicators: Discomfort Pain Intervention(s): Repositioned    Home Living                      Prior Function            PT Goals (current goals can now be found in the care plan section) Progress towards PT goals: Progressing toward goals    Frequency    Min 3X/week      PT Plan Current plan remains appropriate    Co-evaluation              AM-PAC PT "6 Clicks" Mobility   Outcome Measure  Help needed turning  from your back to your side while in a flat bed without using bedrails?: A Little Help needed moving from lying on your back to sitting on the side of a flat bed without using bedrails?: A Little Help needed moving to and from a bed to a chair (including a wheelchair)?: A Little Help needed standing up from a chair using your arms (e.g., wheelchair or bedside chair)?: A Little Help needed to walk in hospital room?: A Little Help needed climbing 3-5 steps with a railing? : A Lot 6 Click Score: 17    End of Session Equipment Utilized During Treatment: Gait belt Activity Tolerance: Patient tolerated treatment well Patient left: in chair;with call bell/phone within reach;with family/visitor present   PT Visit Diagnosis: Unsteadiness on feet (R26.81);Difficulty in walking, not elsewhere classified (R26.2);Muscle weakness  (generalized) (M62.81)     Time: 3845-3646 PT Time Calculation (min) (ACUTE ONLY): 21 min  Charges:  $Gait Training: 8-22 mins                     [name] PT, DPT Acute Rehabilitation Services Pager: [phone] Office: [phone]   [place] Ram E 04/06/2021, 2:33 PM

## 2021-04-06 NOTE — Progress Notes (Signed)
Progress Note  5 Days Post-Op  Subjective: Patient with stable pain at incision, overall well controlled. He reports improvement in diarrhea and incontinence overnight. He denies nausea.   Objective: Vital signs in last 24 hours: Temp:  [98.3 F (36.8 C)-98.6 F (37 C)] 98.4 F (36.9 C) (05/19 0601) Pulse Rate:  [67-70] 68 (05/19 0601) Resp:  [15-18] 15 (05/19 0601) BP: (147-158)/(54-72) 158/54 (05/19 0601) SpO2:  [97 %-99 %] 97 % (05/19 0601) Last BM Date: 04/05/21  Intake/Output from previous day: 05/18 0701 - 05/19 0700 In: 1060 [P.O.:960; IV Piggyback:100] Out: 2225 [Urine:2225] Intake/Output this shift: No intake/output data recorded.  PE: General: pleasant, WD,overweightmale who is laying in bed in NAD Heart: regular, rate, and rhythm.  Lungs: Respiratory effort nonlabored Abd: soft, NT, ND GU: seton in place,L buttock wound stable, no significant cellulitis or induration present   Lab Results:  Recent Labs    04/04/21 0408 04/05/21 0415  WBC 7.2 7.1  HGB 8.5* 9.2*  HCT 27.0* 30.1*  PLT 305 307   BMET Recent Labs    04/04/21 0408 04/05/21 0415  NA 140 139  K 4.1 3.8  CL 107 107  CO2 25 24  GLUCOSE 118* 110*  BUN 26* 20  CREATININE 1.40* 1.24  CALCIUM 8.4* 8.8*   PT/INR No results for input(s): LABPROT, INR in the last 72 hours. CMP     Component Value Date/Time   NA 139 04/05/2021 0415   NA 147 (H) 08/21/2013 1032   K 3.8 04/05/2021 0415   K 3.6 08/21/2013 1032   CL 107 04/05/2021 0415   CL 112 (H) 08/21/2012 1132   CO2 24 04/05/2021 0415   CO2 21 (L) 08/21/2013 1032   GLUCOSE 110 (H) 04/05/2021 0415   GLUCOSE 72 08/21/2013 1032   GLUCOSE 107 (H) 08/21/2012 1132   BUN 20 04/05/2021 0415   BUN 20.0 08/21/2013 1032   CREATININE 1.24 04/05/2021 0415   CREATININE 1.8 (H) 08/21/2013 1032   CALCIUM 8.8 (L) 04/05/2021 0415   CALCIUM 9.1 08/21/2013 1032   PROT 6.6 04/01/2021 0706   PROT 6.9 08/21/2013 1032   ALBUMIN 2.7 (L)  04/01/2021 0706   ALBUMIN 3.6 08/21/2013 1032   AST 23 04/01/2021 0706   AST 22 08/21/2013 1032   ALT 20 04/01/2021 0706   ALT 26 08/21/2013 1032   ALKPHOS 104 04/01/2021 0706   ALKPHOS 58 08/21/2013 1032   BILITOT 0.6 04/01/2021 0706   BILITOT 0.22 08/21/2013 1032   GFRNONAA >60 04/05/2021 0415   GFRAA 54 (L) 01/23/2017 0430   Lipase  No results found for: LIPASE     Studies/Results: No results found.  Anti-infectives: Anti-infectives (From admission, onward)   Start     Dose/Rate Route Frequency Ordered Stop   04/05/21 1600  piperacillin-tazobactam (ZOSYN) IVPB 3.375 g        3.375 g 12.5 mL/hr over 240 Minutes Intravenous Every 8 hours 04/05/21 1340 04/05/21 2039   04/02/21 2300  vancomycin (VANCOREADY) IVPB 1250 mg/250 mL  Status:  Discontinued        1,250 mg 166.7 mL/hr over 90 Minutes Intravenous Every 48 hours 03/31/21 2244 04/02/21 1045   04/01/21 0800  piperacillin-tazobactam (ZOSYN) IVPB 3.375 g  Status:  Discontinued        3.375 g 12.5 mL/hr over 240 Minutes Intravenous Every 8 hours 03/31/21 2243 04/05/21 1340   03/31/21 2245  clindamycin (CLEOCIN) IVPB 600 mg  Status:  Discontinued  600 mg 100 mL/hr over 30 Minutes Intravenous Every 8 hours 03/31/21 2231 04/03/21 1412   03/31/21 2245  vancomycin (VANCOREADY) IVPB 2000 mg/400 mL        2,000 mg 200 mL/hr over 120 Minutes Intravenous STAT 03/31/21 2236 04/01/21 0747   03/31/21 2115  piperacillin-tazobactam (ZOSYN) IVPB 3.375 g        3.375 g 100 mL/hr over 30 Minutes Intravenous  Once 03/31/21 2113 03/31/21 2321   03/31/21 2100  piperacillin-tazobactam (ZOSYN) IVPB 3.375 g  Status:  Discontinued        3.375 g 100 mL/hr over 30 Minutes Intravenous  Once 03/31/21 2056 03/31/21 2058   03/31/21 2100  piperacillin-tazobactam (ZOSYN) IVPB 4.5 g  Status:  Discontinued        4.5 g 200 mL/hr over 30 Minutes Intravenous  Once 03/31/21 2058 03/31/21 2112       Assessment/Plan IDDM AKI on CKD stage  III HTN Prostate cancer s/p seeds Recent COVID infection 2+ weeks ago with resolution in symptoms   Perirectal abscess and rectal ulceration  S/P EUA, I&D, placement of seton 04/01/21 Dr. Marlou Starks  - POD#6 -seton in placewithout significant cellulitis - sitz baths/shower 3-4x daily or more often if needed with bowel function  - do not re-pack incision  - cx withpan sensitive E. Coli - ID recommending augmentin on discharge, I would recommend a total of 5 days abx, OK to stop abx from a surgical standpoint since patient has source control - pt with improvement in diarrhea/incontinence since yesterday    - At some point patient would benefit from attempted fistula repair in 6-12 weeks at the soonest. Need time for the infection to resolve, wound closed wound, fistula to mature. 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 He is very high risk for recurrence given his radiated rectum and morbid obesity and diabetes - stable for discharge from a surgical perspective when dispo figured out    FEN: CM diet VTE: SCDs, ok to have chemical prophylaxis from a surgical standpoint ID: vanc 5/13>5/15; Zosyn 5/13>5/19, clinda 5/13>5/16  LOS: 6 days    Norm Parcel, [facility] 04/06/2021, 8:59 AM Please see Amion for pager number during day hours 7:00am-4:30pm

## 2021-04-07 DIAGNOSIS — N493 Fournier gangrene: Secondary | ICD-10-CM | POA: Diagnosis not present

## 2021-04-07 LAB — GLUCOSE, CAPILLARY
Glucose-Capillary: 101 mg/dL — ABNORMAL HIGH (ref 70–99)
Glucose-Capillary: 118 mg/dL — ABNORMAL HIGH (ref 70–99)
Glucose-Capillary: 135 mg/dL — ABNORMAL HIGH (ref 70–99)
Glucose-Capillary: 172 mg/dL — ABNORMAL HIGH (ref 70–99)
Glucose-Capillary: 181 mg/dL — ABNORMAL HIGH (ref 70–99)
Glucose-Capillary: 79 mg/dL (ref 70–99)

## 2021-04-07 MED ORDER — ENSURE MAX PROTEIN PO LIQD
11.0000 [oz_av] | Freq: Two times a day (BID) | ORAL | Status: DC
  Administered 2021-04-08 – 2021-04-10 (×3): 11 [oz_av] via ORAL

## 2021-04-07 MED ORDER — ACETAMINOPHEN 325 MG PO TABS
650.0000 mg | ORAL_TABLET | Freq: Four times a day (QID) | ORAL | Status: DC
  Administered 2021-04-07 – 2021-04-11 (×15): 650 mg via ORAL
  Filled 2021-04-07 (×16): qty 2

## 2021-04-07 MED ORDER — OXYCODONE HCL 5 MG PO TABS
5.0000 mg | ORAL_TABLET | ORAL | Status: DC | PRN
  Administered 2021-04-07 – 2021-04-10 (×6): 5 mg via ORAL
  Administered 2021-04-10 – 2021-04-11 (×2): 10 mg via ORAL
  Filled 2021-04-07 (×6): qty 1
  Filled 2021-04-07 (×2): qty 2

## 2021-04-07 MED ORDER — HYDROMORPHONE HCL 1 MG/ML IJ SOLN: 0.5000 mg | INTRAMUSCULAR | Status: DC | PRN

## 2021-04-07 NOTE — Progress Notes (Signed)
Progress Note  6 Days Post-Op  Subjective: Patient reports he did have 2 episodes of diarrhea overnight  Objective: Vital signs in last 24 hours: Temp:  [98.1 F (36.7 C)-98.6 F (37 C)] 98.3 F (36.8 C) (05/20 0555) Pulse Rate:  [63-76] 63 (05/20 0555) Resp:  [15-18] 18 (05/20 0555) BP: (123-149)/(58-67) 147/64 (05/20 0555) SpO2:  [98 %-100 %] 98 % (05/20 0555) Last BM Date: 04/06/21  Intake/Output from previous day: 05/19 0701 - 05/20 0700 In: 720 [P.O.:720] Out: 1150 [Urine:1150] Intake/Output this shift: Total I/O In: 240 [P.O.:240] Out: 0   PE: General: pleasant, WD,overweightmale who is laying in bed in NAD Heart: regular, rate, and rhythm.  Lungs: Respiratory effort nonlabored Abd: soft, NT, ND GU: seton in place,L buttock wound stable, no significant cellulitis or induration present   Lab Results:  Recent Labs    04/05/21 0415  WBC 7.1  HGB 9.2*  HCT 30.1*  PLT 307   BMET Recent Labs    04/05/21 0415  NA 139  K 3.8  CL 107  CO2 24  GLUCOSE 110*  BUN 20  CREATININE 1.24  CALCIUM 8.8*   PT/INR No results for input(s): LABPROT, INR in the last 72 hours. CMP     Component Value Date/Time   NA 139 04/05/2021 0415   NA 147 (H) 08/21/2013 1032   K 3.8 04/05/2021 0415   K 3.6 08/21/2013 1032   CL 107 04/05/2021 0415   CL 112 (H) 08/21/2012 1132   CO2 24 04/05/2021 0415   CO2 21 (L) 08/21/2013 1032   GLUCOSE 110 (H) 04/05/2021 0415   GLUCOSE 72 08/21/2013 1032   GLUCOSE 107 (H) 08/21/2012 1132   BUN 20 04/05/2021 0415   BUN 20.0 08/21/2013 1032   CREATININE 1.24 04/05/2021 0415   CREATININE 1.8 (H) 08/21/2013 1032   CALCIUM 8.8 (L) 04/05/2021 0415   CALCIUM 9.1 08/21/2013 1032   PROT 6.6 04/01/2021 0706   PROT 6.9 08/21/2013 1032   ALBUMIN 2.7 (L) 04/01/2021 0706   ALBUMIN 3.6 08/21/2013 1032   AST 23 04/01/2021 0706   AST 22 08/21/2013 1032   ALT 20 04/01/2021 0706   ALT 26 08/21/2013 1032   ALKPHOS 104 04/01/2021 0706    ALKPHOS 58 08/21/2013 1032   BILITOT 0.6 04/01/2021 0706   BILITOT 0.22 08/21/2013 1032   GFRNONAA >60 04/05/2021 0415   GFRAA 54 (L) 01/23/2017 0430   Lipase  No results found for: LIPASE     Studies/Results: No results found.  Anti-infectives: Anti-infectives (From admission, onward)   Start     Dose/Rate Route Frequency Ordered Stop   04/06/21 2200  amoxicillin-clavulanate (AUGMENTIN) 875-125 MG per tablet 1 tablet  Status:  Discontinued        1 tablet Oral Every 12 hours 04/06/21 1657 04/07/21 0711   04/05/21 1600  piperacillin-tazobactam (ZOSYN) IVPB 3.375 g        3.375 g 12.5 mL/hr over 240 Minutes Intravenous Every 8 hours 04/05/21 1340 04/05/21 2039   04/02/21 2300  vancomycin (VANCOREADY) IVPB 1250 mg/250 mL  Status:  Discontinued        1,250 mg 166.7 mL/hr over 90 Minutes Intravenous Every 48 hours 03/31/21 2244 04/02/21 1045   04/01/21 0800  piperacillin-tazobactam (ZOSYN) IVPB 3.375 g  Status:  Discontinued        3.375 g 12.5 mL/hr over 240 Minutes Intravenous Every 8 hours 03/31/21 2243 04/05/21 1340   03/31/21 2245  clindamycin (CLEOCIN) IVPB 600 mg  Status:  Discontinued        600 mg 100 mL/hr over 30 Minutes Intravenous Every 8 hours 03/31/21 2231 04/03/21 1412   03/31/21 2245  vancomycin (VANCOREADY) IVPB 2000 mg/400 mL        2,000 mg 200 mL/hr over 120 Minutes Intravenous STAT 03/31/21 2236 04/01/21 0747   03/31/21 2115  piperacillin-tazobactam (ZOSYN) IVPB 3.375 g        3.375 g 100 mL/hr over 30 Minutes Intravenous  Once 03/31/21 2113 03/31/21 2321   03/31/21 2100  piperacillin-tazobactam (ZOSYN) IVPB 3.375 g  Status:  Discontinued        3.375 g 100 mL/hr over 30 Minutes Intravenous  Once 03/31/21 2056 03/31/21 2058   03/31/21 2100  piperacillin-tazobactam (ZOSYN) IVPB 4.5 g  Status:  Discontinued        4.5 g 200 mL/hr over 30 Minutes Intravenous  Once 03/31/21 2058 03/31/21 2112       Assessment/Plan IDDM AKI on CKD stage  III HTN Prostate cancer s/p seeds Recent COVID infection 2+ weeks ago with resolution in symptoms   Perirectal abscess and rectal ulceration  S/P EUA, I&D, placement of seton 04/01/21 Dr. Marlou Starks  - POD#7 -seton in placewithout significant cellulitis - sitz baths/shower3-4x daily or more often if needed with bowel function  - do not re-pack incision  - cx withpan sensitive E. Coli - ID recommending augmentin on discharge, I would recommend a total of 5 days abx, recommend to stop abx from a surgical standpoint since patient has source control - At some point patient would benefit from attempted fistula repair in 6-12 weeks at the soonest. Need time for the infection to resolve, wound closed wound, fistula to mature. 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 He is very high risk for recurrence given his radiated rectum and morbid obesity and diabetes - stable for discharge from a surgical perspective when dispo figured out  - will see only as needed over the weekend, please page on call provider if issues/questions    FEN: CM diet VTE: SCDs, ok to have chemical prophylaxis from a surgical standpoint ID: vanc 5/13>5/15; Zosyn 5/13>5/19, clinda 5/13>5/16  LOS: 7 days    Norm Parcel, [facility] 04/07/2021, 10:21 AM Please see Amion for pager number during day hours 7:00am-4:30pm

## 2021-04-07 NOTE — Progress Notes (Signed)
PROGRESS NOTE  [name] IHK:[id number] DOB: 06/15/1946 DOA: 03/31/2021 PCP: [facility], [facility]  HPI/Recap of past 24 hours: History of hypertension, type 2 diabetes, CVA in 2015, CKD 3A, anemia of chronic disease, gout, OSA, prostate cancer status post seeds placement in December 2021, previous perirectal abscess without fistula, with c/o 5 day h/o rectal pain, associated with scrotal pain and swelling, diarrhea.  Was at urologist office who noted fever 101.4, sent to ED.  Work-up in the ED revealed seen on CT A/P: soft tissue gas involving L and midline of perineum extending to posterior base of penis. No definate connection to anorectum. Concern for necrotizing fasciitis.  General surgery was consulted.  Status post I&D perirectal abscess with anal fistula and placement of seton by general surgery Dr. Marlou Starks  04/07/21: No new complaints.  Pain is controlled.  TOC assisting with placement.  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 -Post incision and drainage of perirectal abscess with anal fistula and placement of seton per Dr. Marlou Starks 04/01/2021. -MRSA PCR negative. -Blood cultures with no growth to date. -Urine cultures negative. -Wound cultures with few gram-negative rods, few gram-positive cocci, susceptibilities, pansensitive. -Patient seen in consultation by ID who recommended discontinuation of IV clindamycin,  continuation of IV Zosyn while in-house with transition to Augmentin x5 more days on discharge.   -Start Augmentin x5 days as recommended by ID. -Appreciate ID input and recommendations  -Appreciate general surgery and urology consultative services. Continue pain control, IV Dilaudid for severe pain, Vicodin for moderate pain.  Resolved postop delirium Currently alert and oriented x3. Reorient if needed  Insulin-dependent type 2 diabetes mellitus presented with hypoglycemia/history of peripheral neuropathy -Patient noted on admission to have a blood glucose level of 55 initially on presentation received D10 in the ED and briefly on D5 half-normal saline. -Hemoglobin A1c 6.5 (04/01/2021) -CBG on 107 this morning  -Decrease Lantus dose to 15 units daily.  Continue SSI.  Resolving acute kidney injury on chronic kidney disease stage IIIa -Creatinine noted to be 2.58 on presentation. Creatinine downtrending 1.24 from 1.40.  GFR greater than 60. He appears to be at his baseline creatinine 1.2 with GFR greater than 60. -Likely secondary to prerenal azotemia in the setting of ACE inhibitor. -Urinalysis nitrite negative, leukocytes negative, negative protein. -Renal function improving daily and creatinine down to 1.40.  -Continue to hold ACE inhibitor. -Outpatient follow-up.  Anemia of chronic disease. -Likely anemia of chronic disease. -Hemoglobin uptrending 9.2 from 8.5. -Transfusion threshold hemoglobin  < 7.   Hypertension BP is currently not at goal, elevated. -Continue Coreg. -Continue Norvasc 10 mg daily.  -Continue to hold ACE inhibitor secondary to acute kidney injury.  -Outpatient follow-up.  History of prostate cancer status post seeds -Management Per urology.  Recent COVID-19 infection -Patient noted to have tested positive with COVID 2 weeks prior to admission currently asymptomatic. -Repeat testing on admission remains positive. -Patient not hypoxic. -Chest  x-ray with no acute findings. -  Currently not requiring airborne precautions.   DVT prophylaxis: SCDs/subcu Lovenox daily. Code Status: Full Family Communication:  Updated his wife via phone on 04/06/2021. Disposition:   Status is: Inpatient    Dispo: The patient is from: Home  Anticipated d/c is to:  Home with home health PT.  Patient currently being treated for perirectal abscess, hopefully discharge in the next 24 to 48 hours.               Difficult to place patient not applicable.       Consultants:   Urology: Dr. Lovena Neighbours 04/01/2021  General surgery: Dr. Marlou Starks 04/01/2021  Infectious disease: Dr.Comer 04/03/2021  Procedures:  CT abdomen and pelvis 03/31/2021  Chest x-ray 03/31/2021  Incision and drainage perirectal abscess with anal fistula, placement of seton per Dr. Marlou Starks 04/01/2021  Antimicrobials:   IV Zosyn 03/31/2021>>>> 04/06/2021  IV clindamycin 03/31/2021>>>> 04/03/2021  IV vancomycin 5/13/ 2022x1 dose  Augmentin 04/06/2021 x5 days     Objective: Vitals:   04/06/21 1344 04/06/21 2111 04/07/21 0555 04/07/21 1341  BP: (!) 149/67 (!) 123/58 (!) 147/64 (!) 157/69  Pulse: 76 73 63 76  Resp: 18 15 18 18   Temp: 98.6 F (37 C) 98.1 F (36.7 C) 98.3 F (36.8 C) 98.3 F (36.8 C)  TempSrc: Oral Oral Oral Oral  SpO2: 99% 100% 98% 95%  Weight:      Height:        Intake/Output Summary (Last 24 hours) at 04/07/2021 1809 Last data filed at 04/07/2021 1400 Gross per 24 hour  Intake 720 ml  Output 875 ml  Net -155 ml   Filed Weights   03/31/21 1521 04/01/21 0500  Weight: 100.7 kg 103.9 kg    Exam: No changes  . General: 75 y.o. year-old male well-appearing no acute distress.  He is alert and oriented x3.   . Cardiovascular: Regular rate and rhythm no rubs or gallops.  Marland Kitchen Respiratory: Clear to auscultation no wheezes or rales.   . Abdomen: Soft nontender normal bowel sounds present.  . Musculoskeletal: No lower  extremity edema bilaterally. . Skin: Scrotum edematous and erythematous.   Marland Kitchen Psychiatry: Mood is appropriate for condition and setting.   Data Reviewed: CBC: Recent Labs  Lab 04/01/21 0706 04/02/21 0447 04/03/21 0840 04/04/21 0408 04/05/21 0415  WBC 11.4* 11.7* 8.5 7.2 7.1  NEUTROABS  --  10.1* 5.6 4.6 4.0  HGB 8.3* 7.7* 7.9* 8.5* 9.2*  HCT 26.0* 24.3* 24.8* 27.0* 30.1*  MCV 85.2 86.2 87.6 87.7 88.0  PLT 266 279 287 305 226   Basic Metabolic Panel: Recent Labs  Lab 04/01/21 0706 04/02/21 0447 04/03/21 0428 04/03/21 0840 04/04/21 0408 04/05/21 0415  NA 133* 137 138  --  140 139  K 4.1 4.4 4.3  --  4.1 3.8  CL 103 107 107  --  107 107  CO2 22 21* 24  --  25 24  GLUCOSE 133* 251* 206*  --  118* 110*  BUN 33* 35* 36*  --  26* 20  CREATININE 2.04* 2.02* 1.75*  --  1.40* 1.24  CALCIUM 8.3* 8.7* 8.5*  --  8.4* 8.8*  MG  --   --   --  2.4  --   --    GFR: Estimated Creatinine Clearance: 62.2 mL/min (by C-G formula based on SCr of 1.24 mg/dL). Liver Function Tests: Recent Labs  Lab 04/01/21 0706  AST 23  ALT 20  ALKPHOS 104  BILITOT 0.6  PROT 6.6  ALBUMIN  2.7*   No results for input(s): LIPASE, AMYLASE in the last 168 hours. No results for input(s): AMMONIA in the last 168 hours. Coagulation Profile: No results for input(s): INR, PROTIME in the last 168 hours. Cardiac Enzymes: No results for input(s): CKTOTAL, CKMB, CKMBINDEX, TROPONINI in the last 168 hours. BNP (last 3 results) No results for input(s): PROBNP in the last 8760 hours. HbA1C: No results for input(s): HGBA1C in the last 72 hours. CBG: Recent Labs  Lab 04/07/21 0015 04/07/21 0406 04/07/21 0727 04/07/21 1146 04/07/21 1553  GLUCAP 181* 135* 101* 79 118*   Lipid Profile: No results for input(s): CHOL, HDL, LDLCALC, TRIG, CHOLHDL, LDLDIRECT in the last 72 hours. Thyroid Function Tests: No results for input(s): TSH, T4TOTAL, FREET4, T3FREE, THYROIDAB in the last 72 hours. Anemia Panel: No  results for input(s): VITAMINB12, FOLATE, FERRITIN, TIBC, IRON, RETICCTPCT in the last 72 hours. Urine analysis:    Component Value Date/Time   COLORURINE YELLOW 04/01/2021 1603   APPEARANCEUR CLEAR 04/01/2021 1603   LABSPEC 1.014 04/01/2021 1603   PHURINE 5.0 04/01/2021 1603   GLUCOSEU NEGATIVE 04/01/2021 1603   HGBUR NEGATIVE 04/01/2021 1603   BILIRUBINUR NEGATIVE 04/01/2021 1603   KETONESUR NEGATIVE 04/01/2021 1603   PROTEINUR NEGATIVE 04/01/2021 1603   UROBILINOGEN 0.2 09/17/2012 1837   NITRITE NEGATIVE 04/01/2021 1603   LEUKOCYTESUR NEGATIVE 04/01/2021 1603   Sepsis Labs: @LABRCNTIP (procalcitonin:4,lacticidven:4)  ) Recent Results (from the past 240 hour(s))  Blood culture (routine single)     Status: None   Collection Time: 03/31/21  6:00 PM   Specimen: BLOOD  Result Value Ref Range Status   Specimen Description   Final    BLOOD BLOOD LEFT HAND Performed at [facility], [place] [address], [place], [place] [address]    Special Requests   Final    BOTTLES DRAWN AEROBIC AND ANAEROBIC Blood Culture adequate volume Performed at [place] [address], [place], [place] [address]    Culture   Final    NO GROWTH 5 DAYS Performed at [facility], [place] [address], [place], [place] [address]    Report Status 04/05/2021 FINAL  Final  Resp Panel by RT-PCR (Flu A&B, Covid) Nasopharyngeal Swab     Status: Abnormal   Collection Time: 03/31/21  7:52 PM   Specimen: Nasopharyngeal Swab; Nasopharyngeal(NP) swabs in vial transport medium  Result Value Ref Range Status   SARS Coronavirus 2 by RT PCR POSITIVE (A) NEGATIVE Final    Comment: RESULT CALLED TO, READ BACK BY AND VERIFIED WITH: BANNO,A 03/31/21 @2147  BY SEELMOL (NOTE) SARS-CoV-2 target nucleic acids are DETECTED.  The SARS-CoV-2 RNA is generally detectable in upper respiratory specimens during the acute phase of infection. Positive results are indicative of the presence of  the identified virus, but do not rule out bacterial infection or co-infection with other pathogens not detected by the test. Clinical correlation with patient history and other diagnostic information is necessary to determine patient infection status. The expected result is Negative.  Fact Sheet for Patients: EntrepreneurPulse.com.au  Fact Sheet for Healthcare Providers: IncredibleEmployment.be  This test is not yet approved or cleared by the [place] FDA and  has been authorized for detection and/or diagnosis of SARS-CoV-2 by FDA under an Emergency Use Authorization (EUA).  This EUA will remain in effect (meaning this test can be  used) for the duration of  the COVID-19 declaration under Section 564(b)(1) of the Act, 21 U.S.C. section 360bbb-3(b)(1), unless the authorization is terminated  or revoked sooner.     Influenza A by PCR NEGATIVE NEGATIVE Final   Influenza B by PCR NEGATIVE NEGATIVE Final    Comment: (NOTE) The Xpert Xpress SARS-CoV-2/FLU/RSV plus assay is intended as an aid in the diagnosis of influenza from Nasopharyngeal swab specimens and should not be used as a sole basis for treatment. Nasal washings and aspirates are unacceptable for Xpert Xpress SARS-CoV-2/FLU/RSV testing.  Fact Sheet for Patients: EntrepreneurPulse.com.au  Fact Sheet for Healthcare Providers: IncredibleEmployment.be  This test is not yet approved or cleared by the [place] FDA and has been authorized for detection and/or diagnosis of SARS-CoV-2 by FDA under an Emergency Use Authorization (EUA). This EUA will remain in effect (meaning this test can be used) for the duration of the COVID-19 declaration under Section 564(b)(1) of the Act, 21 U.S.C. section 360bbb-3(b)(1), unless the authorization is terminated or revoked.  Performed at [facility], [place] [address] [place], [place]  [address]   MRSA PCR Screening     Status: None   Collection Time: 04/01/21  3:45 AM   Specimen: Nasal Mucosa; Nasopharyngeal  Result Value Ref Range Status   MRSA by PCR NEGATIVE NEGATIVE Final    Comment:        The GeneXpert MRSA Assay (FDA approved for NASAL specimens only), is one component of a comprehensive MRSA colonization surveillance program. It is not intended to diagnose MRSA infection nor to guide or monitor treatment for MRSA infections. Performed at [facility], [place] [address] [place], [place] [address]   Culture, blood (Routine X 2) w Reflex to ID Panel     Status: None   Collection Time: 04/01/21  7:06 AM   Specimen: BLOOD  Result Value Ref Range Status   Specimen Description   Final    BLOOD LEFT HAND Performed at [facility] [address], [place], [place] [address]    Special Requests   Final    BOTTLES DRAWN AEROBIC ONLY Blood Culture adequate volume Performed at [place] [address] [place], [place] [address]    Culture   Final    NO GROWTH 5 DAYS Performed at [facility], [place] [address], [place], [place] [address]    Report Status 04/06/2021 FINAL  Final  Aerobic/Anaerobic Culture w Gram Stain (surgical/deep wound)     Status: None   Collection Time: 04/01/21 10:29 AM   Specimen: PATH Other; Tissue  Result Value Ref Range Status   Specimen Description   Final    PERINEAL ABCESS Performed at [place] [address], [place], [place] [address]    Special Requests   Final    NONE Performed at [facility], [place] [address] [place], [place] [address]    Gram Stain   Final    MODERATE WBC PRESENT, PREDOMINANTLY PMN FEW GRAM POSITIVE COCCI FEW GRAM NEGATIVE RODS    Culture   Final    FEW ESCHERICHIA COLI FEW BACTEROIDES FRAGILIS BETA LACTAMASE POSITIVE Performed at [facility], [place] [address], [place], [place] [address]     Report Status 04/04/2021 FINAL  Final   Organism ID, Bacteria ESCHERICHIA COLI  Final      Susceptibility   Escherichia coli - MIC*    AMPICILLIN <=2 SENSITIVE Sensitive     CEFAZOLIN <=4 SENSITIVE Sensitive     CEFEPIME <=0.12 SENSITIVE Sensitive     CEFTAZIDIME <=1 SENSITIVE Sensitive  CEFTRIAXONE <=0.25 SENSITIVE Sensitive     CIPROFLOXACIN <=0.25 SENSITIVE Sensitive     GENTAMICIN <=1 SENSITIVE Sensitive     IMIPENEM <=0.25 SENSITIVE Sensitive     TRIMETH/SULFA <=20 SENSITIVE Sensitive     AMPICILLIN/SULBACTAM <=2 SENSITIVE Sensitive     PIP/TAZO <=4 SENSITIVE Sensitive     * FEW ESCHERICHIA COLI  Culture, Urine     Status: None   Collection Time: 04/01/21  4:03 PM   Specimen: Urine, Clean Catch  Result Value Ref Range Status   Specimen Description   Final    URINE, CLEAN CATCH Performed at [facility], [place] [address] [place], [place] [address]    Special Requests   Final    NONE Performed at [facility], [place] [address], [place], [place] [address]    Culture   Final    NO GROWTH Performed at [facility], [place] [address], [place], [place] [address]    Report Status 04/02/2021 FINAL  Final  C Difficile Quick Screen (NO PCR Reflex)     Status: None   Collection Time: 04/05/21  1:25 PM   Specimen: STOOL  Result Value Ref Range Status   C Diff antigen NEGATIVE NEGATIVE Final   C Diff toxin NEGATIVE NEGATIVE Final   C Diff interpretation No C. difficile detected.  Final    Comment: Performed at [facility], [place] [address] [place], [place] [address]      Studies: No results found.  Scheduled Meds: . (feeding supplement) PROSource Plus  30 mL Oral TID BM  . acetaminophen  650 mg Oral Q6H  . amitriptyline  50 mg Oral QHS  . amLODipine  10 mg Oral Daily  . atorvastatin  20 mg Oral QHS  . buPROPion  150 mg Oral Daily  . carvedilol  3.125 mg Oral BID WC  . Chlorhexidine Gluconate Cloth  6 each  Topical Daily  . diphenoxylate-atropine  2 tablet Oral BID  . enoxaparin (LOVENOX) injection  40 mg Subcutaneous Q24H  . febuxostat  40 mg Oral QHS  . ferrous sulfate  325 mg Oral TID WC  . gabapentin  600 mg Oral QHS  . insulin aspart  0-9 Units Subcutaneous Q4H  . insulin glargine  15 Units Subcutaneous QHS  . mouth rinse  15 mL Mouth Rinse BID  . multivitamin with minerals  1 tablet Oral Daily  . polycarbophil  625 mg Oral BID  . Ensure Max Protein  11 oz Oral BID  . sodium chloride flush  10-40 mL Intracatheter Q12H  . tamsulosin  0.4 mg Oral QPC supper  . tetrahydrozoline  3 drop Both Eyes Daily  . vitamin B-12  1,000 mcg Oral Daily    Continuous Infusions:    LOS: 7 days     Kayleen Memos, MD [facility] Pager [phone]  If 7PM-7AM, please contact night-coverage [email or web address] Password [facility] 04/07/2021, 6:09 PM

## 2021-04-07 NOTE — TOC Progression Note (Addendum)
Transition of Care [facility]) - Progression Note    Patient Details  Name: KENDAN CORNFORTH MRN: [id number] Date of Birth: 1946-09-02  Transition of [facility]) CM/SW Contact  Lennart Pall, [place] Phone Number: 04/07/2021, 3:01 PM  Clinical Narrative:    Have received short term rehab (30 day) approval from the [place] under the [employer] payment program.  Have alerted pt and wife of approval and have sent referral to all [place] approved facilities in the immediate and surrounding areas.  MD aware.  Pt and wife with concerns about where the bed offers may come from (locations).  Explained to them both that once we have valid bed offers they will need to choose a facility or make arrangements to dc from the hospital to a place of their choosing as he has been deemed medically ready for dc.  They state understanding. TOC will follow up in the morning.    Expected Discharge Plan: Home/Self Care Barriers to Discharge: Other (must enter comment) (await [facility] decision on short term SNF coverage; pt and wife without a home to return to (remodeling))  Expected Discharge Plan and Services Expected Discharge Plan: Home/Self Care   Discharge Planning Services: CM Consult   Living arrangements for the past 2 months: Single Family Home Expected Discharge Date: 04/06/21                                     Social Determinants of Health (SDOH) Interventions    Readmission Risk Interventions No flowsheet data found.

## 2021-04-07 NOTE — Progress Notes (Addendum)
Nutrition Follow-up  DOCUMENTATION CODES:   Obesity unspecified  INTERVENTION:   -Provided "Diarrhea Nutrition Therapy" handout for patient to review  -Ensure MAX Protein po BID, each supplement provides 150 kcal and 30 grams of protein  -Prosource Plus PO BID, each provides 100 kcals and 15g protein  -Reviewed protein options with patient  NUTRITION DIAGNOSIS:   Increased nutrient needs related to acute illness,chronic illness as evidenced by estimated needs.  Ongoing.  GOAL:   Patient will meet greater than or equal to 90% of their needs  Not meeting.  MONITOR:   PO intake,Supplement acceptance,Diet advancement,Labs,Weight trends  REASON FOR ASSESSMENT:   Consult Assessment of nutrition requirement/status,Diet education  ASSESSMENT:   75 y.o. male with medical history of prostate cancer s/p seeds, stage 3 CKD, previous peri-rectal abscess without fistula, HTN, and type 2 DM. He was diagnosed with COVID 2 weeks PTA; symptoms have resolved. He presented to the ED d/t 5-day hx of worsening, severe rectal pain with associated scrotal pain and swelling and diarrhea.  5/13: admitted 5/14: s/p I&D of perirectal abscess and placement of seton  Patient in room, wife not at bedside. Pt reports poor appetite, states he eats a few bites then instantly is having diarrhea. Surgery consulted RD to review chronic diarrhea diet with pt and to provide appropriate protein supplements for diabetes.  Pt reports in the past he likes the [place] protein shakes, these would be appropriate as they are low carbohydrate. Reviewed other options as well and will order Ensure Max for pt to try.  Reviewed diet handout with patient and foods that help bulk up stool and foods that may increase stools. Pt expressed appreciation for the information.  Admission weight: 222 lbs. No recent weights since admission.  Medications: Ferrous sulfate, Multivitamin with minerals daily, Vitamin B-12  Labs  reviewed: CBGs: 89-181  Diet Order:   Diet Order            Diet Carb Modified Fluid consistency: Thin; Room service appropriate? Yes  Diet effective now                 EDUCATION NEEDS:   Not appropriate for education at this time  Skin:  Skin Assessment: Skin Integrity Issues: Skin Integrity Issues:: Incisions Incisions: rectal (5/14)  Last BM:  5/13  Height:   Ht Readings from Last 1 Encounters:  04/01/21 5\' 10"  (1.778 m)    Weight:   Wt Readings from Last 1 Encounters:  04/01/21 103.9 kg    BMI:  Body mass index is 32.87 kg/m.  Estimated Nutritional Needs:   Kcal:  2200-2400 kcal  Protein:  110-125 grams  Fluid:  >/= 2.2 L/day  Clayton Bibles, MS, RD, LDN Inpatient Clinical Dietitian Contact information available via Amion

## 2021-04-07 NOTE — NC FL2 (Signed)
[place] LEVEL OF CARE SCREENING TOOL     IDENTIFICATION  Patient Name: [name] Birthdate: [birth year] Sex: male Admission Date (Current Location): 03/31/2021  [facility] and [place] Number:  Herbalist and Address:  [facility],  [place], [place]      Provider Number: [id number]  Attending Physician Name and Address:  Kayleen Memos, DO  Relative Name and Phone Number:  wife, Radwan Cowley    Current Level of Care: Hospital Recommended Level of Care: [place] Prior Approval Number:    Date Approved/Denied:   PASRR Number: [id number]  Discharge Plan: Home    Current Diagnoses: Patient Active Problem List   Diagnosis Date Noted  . 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 Renal failure (ARF), acute on chronic ([place]) 04/03/2021  . Perirectal abscess   . Sepsis with acute renal failure without septic shock ([place])   . Fournier's gangrene 03/31/2021  . Cellulitis of perineum 03/31/2021  . AKI (acute kidney injury) ([place]) 03/31/2021  . Malignant neoplasm of prostate ([place]) 10/04/2020  . 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 Cryptogenic stroke ([place]) 08/03/2014  . Gout 03/13/2014  . CVA (cerebral infarction) 03/12/2014  . Fever 09/18/2012  . DM (diabetes mellitus) ([place]) 09/18/2012  . CKD (chronic kidney disease) 09/18/2012  . Anemia 09/18/2012  . HTN (hypertension) 09/18/2012  . Hyperlipidemia 09/18/2012  . Anemia associated with chronic renal failure 10/18/2011    Orientation RESPIRATION BLADDER Height & Weight     Self,Time,Situation,Place  Normal Continent Weight: 229 lb 0.9 oz (103.9 kg) Height:  5\' 10"  (177.8 cm)  BEHAVIORAL SYMPTOMS/MOOD NEUROLOGICAL BOWEL NUTRITION STATUS      Incontinent    AMBULATORY STATUS COMMUNICATION OF NEEDS Skin   Limited Assist   Surgical wounds                       Personal Care Assistance Level of Assistance  Dressing,Bathing Bathing  Assistance: Limited assistance   Dressing Assistance: Limited assistance     Functional Limitations Info             SPECIAL CARE FACTORS FREQUENCY  PT (By licensed PT),OT (By licensed OT)     PT Frequency: 5x/wk OT Frequency: 5x/wk            Contractures Contractures Info: Not present    Additional Factors Info  Code Status,Allergies Code Status Info: Full Allergies Info: see MAR           Current Medications (04/07/2021):  This is the current hospital active medication list Current Facility-Administered Medications  Medication Dose Route Frequency Provider Last Rate Last Admin  . (feeding supplement) PROSource Plus liquid 30 mL  30 mL Oral TID BM Florencia Reasons, MD   30 mL at 04/06/21 1643  . acetaminophen (TYLENOL) tablet 650 mg  650 mg Oral Q6H Norm Parcel, PA-C   650 mg at 04/07/21 1254  . amitriptyline (ELAVIL) tablet 50 mg  50 mg Oral QHS Autumn Messing III, MD   50 mg at 04/06/21 2148  . amLODipine (NORVASC) tablet 10 mg  10 mg Oral Daily Eugenie Filler, MD   10 mg at 04/07/21 0919  . atorvastatin (LIPITOR) tablet 20 mg  20 mg Oral QHS Autumn Messing III, MD   20 mg at 04/06/21 2147  . buPROPion (WELLBUTRIN XL) 24 hr tablet 150 mg  150 mg Oral Daily Jovita Kussmaul, MD  150 mg at 04/07/21 0919  . carvedilol (COREG) tablet 3.125 mg  3.125 mg Oral BID WC Florencia Reasons, MD   3.125 mg at 04/07/21 0919  . Chlorhexidine Gluconate Cloth 2 % PADS 6 each  6 each Topical Daily Jovita Kussmaul, MD   6 each at 04/07/21 [phone]  . diphenoxylate-atropine (LOMOTIL) 2.5-0.025 MG per tablet 1-2 tablet  1-2 tablet Oral QID PRN Michael Boston, MD      . diphenoxylate-atropine (LOMOTIL) 2.5-0.025 MG per tablet 2 tablet  2 tablet Oral BID Michael Boston, MD   2 tablet at 04/07/21 0920  . enoxaparin (LOVENOX) injection 40 mg  40 mg Subcutaneous Q24H Hall, Carole N, DO   40 mg at 04/06/21 1429  . febuxostat (ULORIC) tablet 40 mg  40 mg Oral QHS Autumn Messing III, MD   40 mg at 04/06/21 2150  . ferrous  sulfate tablet 325 mg  325 mg Oral TID WC Michael Boston, MD   325 mg at 04/07/21 1254  . gabapentin (NEURONTIN) capsule 600 mg  600 mg Oral QHS Autumn Messing III, MD   600 mg at 04/06/21 2147  . HYDROmorphone (DILAUDID) injection 0.5 mg  0.5 mg Intravenous Q4H PRN Norm Parcel, PA-C      . insulin aspart (novoLOG) injection 0-9 Units  0-9 Units Subcutaneous Q4H Autumn Messing III, MD   1 Units at 04/07/21 [phone]  . insulin glargine (LANTUS) injection 15 Units  15 Units Subcutaneous QHS Eugenie Filler, MD   15 Units at 04/06/21 2146  . MEDLINE mouth rinse  15 mL Mouth Rinse BID Autumn Messing III, MD   15 mL at 04/07/21 8786  . multivitamin with minerals tablet 1 tablet  1 tablet Oral Daily Florencia Reasons, MD   1 tablet at 04/07/21 0919  . ondansetron (ZOFRAN) tablet 4 mg  4 mg Oral Q6H PRN Autumn Messing III, MD       Or  . ondansetron [facility]) injection 4 mg  4 mg Intravenous Q6H PRN Autumn Messing III, MD      . oxyCODONE (Oxy IR/ROXICODONE) immediate release tablet 5-10 mg  5-10 mg Oral Q4H PRN Norm Parcel, PA-C      . polycarbophil (FIBERCON) tablet 625 mg  625 mg Oral BID Michael Boston, MD   625 mg at 04/07/21 0919  . protein supplement (ENSURE MAX) liquid  11 oz Oral BID Irene Pap N, DO      . sodium chloride flush (NS) 0.9 % injection 10-40 mL  10-40 mL Intracatheter Q12H Florencia Reasons, MD   10 mL at 04/05/21 0914  . sodium chloride flush (NS) 0.9 % injection 10-40 mL  10-40 mL Intracatheter PRN Florencia Reasons, MD      . tamsulosin [facility]) capsule 0.4 mg  0.4 mg Oral QPC supper Eugenie Filler, MD   0.4 mg at 04/06/21 1643  . tetrahydrozoline 0.05 % ophthalmic solution 3 drop  3 drop Both Eyes Daily Autumn Messing III, MD   3 drop at 04/07/21 7672  . vitamin B-12 (CYANOCOBALAMIN) tablet 1,000 mcg  1,000 mcg Oral Daily Eugenie Filler, MD   1,000 mcg at 04/07/21 0920     Discharge Medications: Please see discharge summary for a list of discharge medications.  Relevant Imaging Results:  Relevant Lab  Results:   Additional Information SS# [id number]  Lennart Pall, LCSW

## 2021-04-08 DIAGNOSIS — N493 Fournier gangrene: Secondary | ICD-10-CM | POA: Diagnosis not present

## 2021-04-08 LAB — GLUCOSE, CAPILLARY
Glucose-Capillary: 104 mg/dL — ABNORMAL HIGH (ref 70–99)
Glucose-Capillary: 112 mg/dL — ABNORMAL HIGH (ref 70–99)
Glucose-Capillary: 181 mg/dL — ABNORMAL HIGH (ref 70–99)
Glucose-Capillary: 181 mg/dL — ABNORMAL HIGH (ref 70–99)
Glucose-Capillary: 196 mg/dL — ABNORMAL HIGH (ref 70–99)
Glucose-Capillary: 94 mg/dL (ref 70–99)

## 2021-04-08 MED ORDER — INSULIN ASPART 100 UNIT/ML IJ SOLN
0.0000 [IU] | Freq: Three times a day (TID) | INTRAMUSCULAR | Status: DC
Start: 2021-04-09 — End: 2021-04-11
  Administered 2021-04-09 (×3): 1 [IU] via SUBCUTANEOUS
  Administered 2021-04-10: 2 [IU] via SUBCUTANEOUS
  Administered 2021-04-10: 1 [IU] via SUBCUTANEOUS
  Administered 2021-04-10: 2 [IU] via SUBCUTANEOUS
  Administered 2021-04-11: 1 [IU] via SUBCUTANEOUS

## 2021-04-08 NOTE — TOC Progression Note (Addendum)
Transition of Care [facility] - Progression Note    Patient Details  Name: PARSA RICKETT MRN: [id number] Date of Birth: 26-Jun-1946  Transition of Care [facility]) CM/SW Contact  Elliot Gurney [place], [place] Phone Number: [phone] 04/08/2021, 9:56 AM  Clinical Narrative:    Phone call to patient's spouse to discuss additional bed offers: [place], [place]. Patient's spouse continues to be indecisive and would like to discuss options with patient. Phone call to patient, bed offers provided, he is considering home with [facility] as well. Patient's spouse on her way to the hospital to discuss both options  with patient. They agree to make a decision today.  11:00AM: Met with patient and spouse at bedside to discuss discharge plan. Patient hesitant to agree to SNF placement, however spouse states that she is unable to care for patient at home due to frequent falls and small living quarters. Patient and spouse agreeable to SNF and have chosen [facility]. Phone call to Maudie Mercury at [facility] who has extended a bed offer for Monday. Phone call to the [facility] [phone] and [facility] Based Care department to request authorization. Voicemail message left requesting a return call.  Transitions of Care to continue to follow   [facility], LCSW Transition of Care [phone]    Expected Discharge Plan: Home/Self Care Barriers to Discharge: Other (must enter comment) (await [facility] decision on short term SNF coverage; pt and wife without a home to return to (remodeling))  Expected Discharge Plan and Services Expected Discharge Plan: Home/Self Care   Discharge Planning Services: CM Consult   Living arrangements for the past 2 months: Single Family Home Expected Discharge Date: 04/06/21                                     Social Determinants of Health (SDOH) Interventions    Readmission Risk Interventions No flowsheet data  found.

## 2021-04-08 NOTE — Progress Notes (Signed)
PROGRESS NOTE  [name] IWP:[id number] DOB: Jan 09, 1946 DOA: 03/31/2021 PCP: [facility]  HPI/Recap of past 24 hours: History of hypertension, type 2 diabetes, CVA in 2015, CKD 3A, anemia of chronic disease, gout, OSA, prostate cancer status post seeds placement in December 2021, previous perirectal abscess without fistula, with c/o 5 day h/o rectal pain, associated with scrotal pain and swelling, diarrhea.  Was at urologist office who noted fever 101.4, sent to ED.  Work-up in the ED revealed seen on CT A/P: soft tissue gas involving L and midline of perineum extending to posterior base of penis. No definate connection to anorectum. Concern for necrotizing fasciitis.  General surgery was consulted.  Status post I&D perirectal abscess with anal fistula and placement of seton by general surgery Dr. Marlou Starks  04/08/21: No new complaints.  Awaiting placement to rehab.  Assessment/Plan: Principal Problem:   Fournier's gangrene Active Problems:   DM (diabetes mellitus) (HCC)   CKD (chronic kidney disease)   Anemia   HTN (hypertension)   Malignant neoplasm of prostate (HCC)   Cellulitis of perineum   AKI (acute kidney injury) ([place])   Perirectal abscess   Sepsis with acute renal failure without septic shock (HCC)   Renal failure (ARF), acute on chronic (HCC)   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  Perirectal abscess/necrotizing fasciitis/sepsis, POA -Patient presented with fever, leukocytosis, tachypnea, acute kidney injury noted to have a perirectal abscess. -Post incision and drainage of perirectal abscess with anal fistula and placement of seton per Dr. Marlou Starks 04/01/2021. -MRSA PCR negative. -Blood cultures with no growth to date. -Urine cultures negative. -Wound cultures with few gram-negative rods, few gram-positive cocci, susceptibilities, pansensitive. -Patient seen in consultation by ID who recommended discontinuation of IV clindamycin, continuation of IV Zosyn  while in-house with transition to Augmentin x5 more days on discharge.   -Start Augmentin x5 days as recommended by ID. -Appreciate ID input and recommendations  -Appreciate general surgery and urology consultative services. Continue pain control, IV Dilaudid for severe pain, Vicodin for moderate pain.  Resolved postop delirium Currently alert and oriented x3. Reorient if needed  Insulin-dependent type 2 diabetes mellitus presented with hypoglycemia/history of peripheral neuropathy -Patient noted on admission to have a blood glucose level of 55 initially on presentation received D10 in the ED and briefly on D5 half-normal saline. -Hemoglobin A1c 6.5 (04/01/2021) -CBG on 107 this morning  -Decrease Lantus dose to 15 units daily.  Continue SSI.  Resolving acute kidney injury on chronic kidney disease stage IIIa -Creatinine noted to be 2.58 on presentation. Creatinine downtrending 1.24 from 1.40.  GFR greater than 60. He appears to be at his baseline creatinine 1.2 with GFR greater than 60. -Likely secondary to prerenal azotemia in the setting of ACE inhibitor. -Urinalysis nitrite negative, leukocytes negative, negative protein. -Renal function improving daily and creatinine down to 1.40.  -Continue to hold ACE inhibitor. -Outpatient follow-up.  Anemia of chronic disease. -Likely anemia of chronic disease. -Hemoglobin uptrending 9.2 from 8.5. -Transfusion threshold hemoglobin  < 7.   Hypertension BP is currently not at goal, elevated. -Continue Coreg. -Continue Norvasc 10 mg daily.  -Continue to hold ACE inhibitor secondary to acute kidney injury.  -Outpatient follow-up.  History of prostate cancer status post seeds -Management Per urology.  Recent COVID-19 infection -Patient noted to have tested positive with COVID 2 weeks prior to admission currently asymptomatic. -Repeat testing on admission remains positive. -Patient not hypoxic. -Chest x-ray with no acute  findings. -Currently not requiring airborne  precautions.   DVT prophylaxis: SCDs/subcu Lovenox daily. Code Status: Full Family Communication:  Updated his wife via phone on 04/06/2021. Disposition:   Status is: Inpatient    Dispo: The patient is from: Home  Anticipated d/c is to:  Home with home health PT.  Patient currently being treated for perirectal abscess, hopefully discharge in the next 24 to 48 hours.               Difficult to place patient not applicable.       Consultants:   Urology: Dr. Lovena Neighbours 04/01/2021  General surgery: Dr. Marlou Starks 04/01/2021  Infectious disease: Dr.Comer 04/03/2021  Procedures:  CT abdomen and pelvis 03/31/2021  Chest x-ray 03/31/2021  Incision and drainage perirectal abscess with anal fistula, placement of seton per Dr. Marlou Starks 04/01/2021  Antimicrobials:   IV Zosyn 03/31/2021>>>> 04/06/2021  IV clindamycin 03/31/2021>>>> 04/03/2021  IV vancomycin 5/13/ 2022x1 dose  Augmentin 04/06/2021 x5 days     Objective: Vitals:   04/07/21 1341 04/07/21 2021 04/08/21 0603 04/08/21 1348  BP: (!) 157/69 (!) 146/73 (!) 165/66 (!) 166/71  Pulse: 76 76 66 75  Resp: 18 18 18 16   Temp: 98.3 F (36.8 C) 98.2 F (36.8 C) 98 F (36.7 C) 98.6 F (37 C)  TempSrc: Oral Oral Oral Oral  SpO2: 95% 97% 98% 99%  Weight:      Height:        Intake/Output Summary (Last 24 hours) at 04/08/2021 1830 Last data filed at 04/08/2021 1400 Gross per 24 hour  Intake 1080 ml  Output 2150 ml  Net -1070 ml   Filed Weights   03/31/21 1521 04/01/21 0500  Weight: 100.7 kg 103.9 kg    Exam: No changes  . General: 75 y.o. year-old male well-appearing no acute distress.  He is alert and oriented x3.   . Cardiovascular: Regular rate and rhythm no rubs or gallops.  Mar[place] Kitchen Respiratory: Clear to auscultation no wheezes or rales.   . Abdomen: Soft nontender normal bowel sounds present.  . Musculoskeletal: No lower extremity edema  bilaterally. . Skin: Scrotum edematous and erythematous.   Marland Kitchen Psychiatry: Mood is appropriate for condition and setting.   Data Reviewed: CBC: Recent Labs  Lab 04/02/21 0447 04/03/21 0840 04/04/21 0408 04/05/21 0415  WBC 11.7* 8.5 7.2 7.1  NEUTROABS 10.1* 5.6 4.6 4.0  HGB 7.7* 7.9* 8.5* 9.2*  HCT 24.3* 24.8* 27.0* 30.1*  MCV 86.2 87.6 87.7 88.0  PLT 279 287 305 [id number]   Basic Metabolic Panel: Recent Labs  Lab 04/02/21 0447 04/03/21 0428 04/03/21 0840 04/04/21 0408 04/05/21 0415  NA 137 138  --  140 139  K 4.4 4.3  --  4.1 3.8  CL 107 107  --  107 107  CO2 21* 24  --  25 24  GLUCOSE 251* 206*  --  118* 110*  BUN 35* 36*  --  26* 20  CREATININE 2.02* 1.75*  --  1.40* 1.24  CALCIUM 8.7* 8.5*  --  8.4* 8.8*  MG  --   --  2.4  --   --    GFR: Estimated Creatinine Clearance: 62.2 mL/min (by C-G formula based on SCr of 1.24 mg/dL). Liver Function Tests: No results for input(s): AST, ALT, ALKPHOS, BILITOT, PROT, ALBUMIN in the last 168 hours. No results for input(s): LIPASE, AMYLASE in the last 168 hours. No results for input(s): AMMONIA in the last 168 hours. Coagulation Profile: No results for input(s): INR, PROTIME in the last 168 hours. Cardiac  Enzymes: No results for input(s): CKTOTAL, CKMB, CKMBINDEX, TROPONINI in the last 168 hours. BNP (last 3 results) No results for input(s): PROBNP in the last 8760 hours. HbA1C: No results for input(s): HGBA1C in the last 72 hours. CBG: Recent Labs  Lab 04/07/21 2358 04/08/21 0404 04/08/21 0744 04/08/21 1205 04/08/21 1619  GLUCAP 181* 94 104* 181* 196*   Lipid Profile: No results for input(s): CHOL, HDL, LDLCALC, TRIG, CHOLHDL, LDLDIRECT in the last 72 hours. Thyroid Function Tests: No results for input(s): TSH, T4TOTAL, FREET4, T3FREE, THYROIDAB in the last 72 hours. Anemia Panel: No results for input(s): VITAMINB12, FOLATE, FERRITIN, TIBC, IRON, RETICCTPCT in the last 72 hours. Urine analysis:    Component Value  Date/Time   COLORURINE YELLOW 04/01/2021 1603   APPEARANCEUR CLEAR 04/01/2021 1603   LABSPEC 1.014 04/01/2021 1603   PHURINE 5.0 04/01/2021 1603   GLUCOSEU NEGATIVE 04/01/2021 1603   HGBUR NEGATIVE 04/01/2021 1603   [place] 04/01/2021 1603   KETONESUR NEGATIVE 04/01/2021 1603   PROTEINUR NEGATIVE 04/01/2021 1603   UROBILINOGEN 0.2 09/17/2012 1837   NITRITE NEGATIVE 04/01/2021 1603   LEUKOCYTESUR NEGATIVE 04/01/2021 1603   Sepsis Labs: @LABRCNTIP (procalcitonin:4,lacticidven:4)  ) Recent Results (from the past 240 hour(s))  Blood culture (routine single)     Status: None   Collection Time: 03/31/21  6:00 PM   Specimen: BLOOD  Result Value Ref Range Status   Specimen Description   Final    BLOOD BLOOD LEFT HAND Performed at [facility] [place] [address] [place], [place] [address]    Special Requests   Final    BOTTLES DRAWN AEROBIC AND ANAEROBIC Blood Culture adequate volume Performed at [place] [address] [place], [place] [address]    Culture   Final    NO GROWTH 5 DAYS Performed at [facility], [place] [address] [place], [place] [address]    Report Status 04/05/2021 FINAL  Final  Resp Panel by RT-PCR (Flu A&B, Covid) Nasopharyngeal Swab     Status: Abnormal   Collection Time: 03/31/21  7:52 PM   Specimen: Nasopharyngeal Swab; Nasopharyngeal(NP) swabs in vial transport medium  Result Value Ref Range Status   SARS Coronavirus 2 by RT PCR POSITIVE (A) NEGATIVE Final    Comment: RESULT CALLED TO, READ BACK BY AND VERIFIED WITH: BANNO,A 03/31/21 @2147  BY SEELMOL (NOTE) SARS-CoV-2 target nucleic acids are DETECTED.  The SARS-CoV-2 RNA is generally detectable in upper respiratory specimens during the acute phase of infection. Positive results are indicative of the presence of the identified virus, but do not rule out bacterial infection or co-infection with other pathogens not detected by the test. Clinical  correlation with patient history and other diagnostic information is necessary to determine patient infection status. The expected result is Negative.  Fact Sheet for Patients: EntrepreneurPulse.com.au  Fact Sheet for Healthcare Providers: IncredibleEmployment.be  This test is not yet approved or cleared by the [place] FDA and  has been authorized for detection and/or diagnosis of SARS-CoV-2 by FDA under an Emergency Use Authorization (EUA).  This EUA will remain in effect (meaning this test can be  used) for the duration of  the COVID-19 declaration under Section 564(b)(1) of the Act, 21 U.S.C. section 360bbb-3(b)(1), unless the authorization is terminated or revoked sooner.     Influenza A by PCR NEGATIVE NEGATIVE Final   Influenza B by PCR NEGATIVE NEGATIVE Final    Comment: (NOTE) The Xpert Xpress SARS-CoV-2/FLU/RSV plus assay is intended as an  aid in the diagnosis of influenza from Nasopharyngeal swab specimens and should not be used as a sole basis for treatment. Nasal washings and aspirates are unacceptable for Xpert Xpress SARS-CoV-2/FLU/RSV testing.  Fact Sheet for Patients: EntrepreneurPulse.com.au  Fact Sheet for Healthcare Providers: IncredibleEmployment.be  This test is not yet approved or cleared by the [place] FDA and has been authorized for detection and/or diagnosis of SARS-CoV-2 by FDA under an Emergency Use Authorization (EUA). This EUA will remain in effect (meaning this test can be used) for the duration of the COVID-19 declaration under Section 564(b)(1) of the Act, 21 U.S.C. section 360bbb-3(b)(1), unless the authorization is terminated or revoked.  Performed at [facility], [place] [address] [place], [place] [address]   MRSA PCR Screening     Status: None   Collection Time: 04/01/21  3:45 AM   Specimen: Nasal Mucosa; Nasopharyngeal  Result Value  Ref Range Status   MRSA by PCR NEGATIVE NEGATIVE Final    Comment:        The GeneXpert MRSA Assay (FDA approved for NASAL specimens only), is one component of a comprehensive MRSA colonization surveillance program. It is not intended to diagnose MRSA infection nor to guide or monitor treatment for MRSA infections. Performed at [facility], [place] [address] [place], [place] [address]   Culture, blood (Routine X 2) w Reflex to ID Panel     Status: None   Collection Time: 04/01/21  7:06 AM   Specimen: BLOOD  Result Value Ref Range Status   Specimen Description   Final    BLOOD LEFT HAND Performed at [place] [address] [place], [place] [address]    Special Requests   Final    BOTTLES DRAWN AEROBIC ONLY Blood Culture adequate volume Performed at [place] [address] [place], [place] [address]    Culture   Final    NO GROWTH 5 DAYS Performed at [facility], [place] [address] [place], [place] [address]    Report Status 04/06/2021 FINAL  Final  Aerobic/Anaerobic Culture w Gram Stain (surgical/deep wound)     Status: None   Collection Time: 04/01/21 10:29 AM   Specimen: PATH Other; Tissue  Result Value Ref Range Status   Specimen Description   Final    PERINEAL ABCESS Performed at [place] [address] [place], [place] [address]    Special Requests   Final    NONE Performed at [facility], [place] [address] [place], [place] [address]    Gram Stain   Final    MODERATE WBC PRESENT, PREDOMINANTLY PMN FEW GRAM POSITIVE COCCI FEW GRAM NEGATIVE RODS    Culture   Final    FEW ESCHERICHIA COLI FEW BACTEROIDES FRAGILIS BETA LACTAMASE POSITIVE Performed at [facility], [place] [address] [place],  [address]    Report Status 04/04/2021 FINAL  Final   Organism ID, Bacteria ESCHERICHIA COLI  Final      Susceptibility   Escherichia coli - MIC*     AMPICILLIN <=2 SENSITIVE Sensitive     CEFAZOLIN <=4 SENSITIVE Sensitive     CEFEPIME <=0.12 SENSITIVE Sensitive     CEFTAZIDIME <=1 SENSITIVE Sensitive     CEFTRIAXONE <=0.25 SENSITIVE Sensitive     CIPROFLOXACIN <=0.25 SENSITIVE Sensitive     GENTAMICIN <=1 SENSITIVE Sensitive     IMIPENEM <=0.25 SENSITIVE Sensitive     TRIMETH/SULFA <=20 SENSITIVE Sensitive  AMPICILLIN/SULBACTAM <=2 SENSITIVE Sensitive     PIP/TAZO <=4 SENSITIVE Sensitive     * FEW ESCHERICHIA COLI  Culture, Urine     Status: None   Collection Time: 04/01/21  4:03 PM   Specimen: Urine, Clean Catch  Result Value Ref Range Status   Specimen Description   Final    URINE, CLEAN CATCH Performed at [facility], [place] [address], [place], [place] [address]    Special Requests   Final    NONE Performed at [facility], [place] [address] [place], [place] [address]    Culture   Final    NO GROWTH Performed at [facility], [place] [address], [place], [place] [address]    Report Status 04/02/2021 FINAL  Final  C Difficile Quick Screen (NO PCR Reflex)     Status: None   Collection Time: 04/05/21  1:25 PM   Specimen: STOOL  Result Value Ref Range Status   C Diff antigen NEGATIVE NEGATIVE Final   C Diff toxin NEGATIVE NEGATIVE Final   C Diff interpretation No C. difficile detected.  Final    Comment: Performed at [facility], [place] [address] [place],  [address]      Studies: No results found.  Scheduled Meds: . (feeding supplement) PROSource Plus  30 mL Oral TID BM  . acetaminophen  650 mg Oral Q6H  . amitriptyline  50 mg Oral QHS  . amLODipine  10 mg Oral Daily  . atorvastatin  20 mg Oral QHS  . buPROPion  150 mg Oral Daily  . carvedilol  3.125 mg Oral BID WC  . Chlorhexidine Gluconate Cloth  6 each Topical Daily  . diphenoxylate-atropine  2 tablet Oral BID  . enoxaparin (LOVENOX) injection  40 mg Subcutaneous Q24H  . febuxostat  40 mg  Oral QHS  . ferrous sulfate  325 mg Oral TID WC  . gabapentin  600 mg Oral QHS  . insulin aspart  0-9 Units Subcutaneous Q4H  . insulin glargine  15 Units Subcutaneous QHS  . mouth rinse  15 mL Mouth Rinse BID  . multivitamin with minerals  1 tablet Oral Daily  . polycarbophil  625 mg Oral BID  . Ensure Max Protein  11 oz Oral BID  . sodium chloride flush  10-40 mL Intracatheter Q12H  . tamsulosin  0.4 mg Oral QPC supper  . tetrahydrozoline  3 drop Both Eyes Daily  . vitamin B-12  1,000 mcg Oral Daily    Continuous Infusions:    LOS: 8 days     Kayleen Memos, MD [facility] Pager [phone]  If 7PM-7AM, please contact night-coverage [email or web address] Password [facility] 04/08/2021, 6:30 PM

## 2021-04-09 DIAGNOSIS — N493 Fournier gangrene: Secondary | ICD-10-CM | POA: Diagnosis not present

## 2021-04-09 LAB — GLUCOSE, CAPILLARY
Glucose-Capillary: 141 mg/dL — ABNORMAL HIGH (ref 70–99)
Glucose-Capillary: 148 mg/dL — ABNORMAL HIGH (ref 70–99)
Glucose-Capillary: 141 mg/dL — ABNORMAL HIGH (ref 70–99)
Glucose-Capillary: 112 mg/dL — ABNORMAL HIGH (ref 70–99)

## 2021-04-09 NOTE — Progress Notes (Signed)
PROGRESS NOTE  [name] EAV:[id number] DOB: 07-04-1946 DOA: 03/31/2021 PCP: [facility]  HPI/Recap of past 24 hours: History of hypertension, type 2 diabetes, CVA in 2015, CKD 3A, anemia of chronic disease, gout, OSA, prostate cancer status post seeds placement in December 2021, previous perirectal abscess without fistula, with c/o 5 day h/o rectal pain, associated with scrotal pain and swelling, diarrhea.  Was at urologist office who noted fever 101.4, sent to ED.  Work-up in the ED revealed seen on CT A/P: soft tissue gas involving L and midline of perineum extending to posterior base of penis. No definate connection to anorectum. Concern for necrotizing fasciitis.  General surgery was consulted.  Status post I&D perirectal abscess with anal fistula and placement of seton by general surgery Dr. Carolynne Edouard  04/09/21:   [name] was seen this morning.  He has no new complaints.  His pain level is about a 3 out of 10 and feels that it is well controlled.  Currently awaiting SNF placement possibly on 04/10/2021.  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 -Post incision and drainage of perirectal abscess with anal fistula and placement of seton per Dr. Carolynne Edouard 04/01/2021. -MRSA PCR negative. -Blood cultures with no growth to date. -Urine cultures negative. -Wound cultures with few gram-negative rods, few gram-positive cocci,  susceptibilities, pansensitive. -Patient seen in consultation by ID who recommended discontinuation of IV clindamycin, continuation of IV Zosyn while in-house with transition to Augmentin x5 more days on discharge.   -Start Augmentin x5 days as recommended by ID. -Appreciate ID input and recommendations  -Appreciate general surgery and urology consultative services. Continue pain control, IV Dilaudid for severe pain, Vicodin for moderate pain.  Resolved postop delirium Currently alert and oriented x3. Reorient if needed  Insulin-dependent type 2 diabetes mellitus presented with hypoglycemia/history of peripheral neuropathy -Patient noted on admission to have a blood glucose level of 55 initially on presentation received D10 in the ED and briefly on D5 half-normal saline. -Hemoglobin A1c 6.5 (04/01/2021) -CBG on 107 this morning  -Decrease Lantus dose to 15 units daily.  Continue SSI.  Resolving acute kidney injury on chronic kidney disease stage IIIa -Creatinine noted to be 2.58 on presentation. Creatinine downtrending 1.24 from 1.40.  GFR greater than 60. He appears to be at his baseline creatinine 1.2 with GFR greater than 60. -Likely secondary to prerenal azotemia in the setting of ACE inhibitor. -Urinalysis nitrite negative, leukocytes negative, negative protein. -Renal function improving daily and creatinine down to 1.40.  -Continue to hold ACE inhibitor. -Outpatient follow-up.  Anemia of chronic disease. -Likely anemia of chronic disease. -Hemoglobin uptrending 9.2 from 8.5. -Transfusion threshold hemoglobin  < 7.   Hypertension BP is currently not at goal, elevated. -Continue Coreg. -Continue Norvasc 10 mg daily.  -Continue to hold ACE inhibitor secondary to acute kidney injury.  -Outpatient follow-up.  History of prostate cancer status post seeds -Management Per urology.  Recent COVID-19 infection -Patient noted to  have tested positive with COVID 2 weeks prior  to admission currently asymptomatic. -Repeat testing on admission remains positive. -Patient not hypoxic. -Chest x-ray with no acute findings. -Currently not requiring airborne precautions.   DVT prophylaxis: SCDs/subcu Lovenox daily. Code Status: Full Family Communication:  Updated his wife via phone on 04/06/2021. Disposition:   Status is: Inpatient    Dispo: The patient is from: Home  Anticipated d/c is to:  Home with home health PT.  Patient currently being treated for perirectal abscess, hopefully discharge in the next 24 to 48 hours.               Difficult to place patient not applicable.       Consultants:   Urology: Dr. Lovena Neighbours 04/01/2021  General surgery: Dr. Marlou Starks 04/01/2021  Infectious disease: Dr.Comer 04/03/2021  Procedures:  CT abdomen and pelvis 03/31/2021  Chest x-ray 03/31/2021  Incision and drainage perirectal abscess with anal fistula, placement of seton per Dr. Marlou Starks 04/01/2021  Antimicrobials:   IV Zosyn 03/31/2021>>>> 04/06/2021  IV clindamycin 03/31/2021>>>> 04/03/2021  IV vancomycin 5/13/ 2022x1 dose  Augmentin 04/06/2021 x5 days     Objective: Vitals:   04/08/21 0603 04/08/21 1348 04/08/21 2018 04/09/21 0600  BP: (!) 165/66 (!) 166/71 (!) 148/68 (!) 154/78  Pulse: 66 75 68 67  Resp: 18 16 16 18   Temp: 98 F (36.7 C) 98.6 F (37 C) 98.7 F (37.1 C) 98.1 F (36.7 C)  TempSrc: Oral Oral Oral Oral  SpO2: 98% 99% 99% 97%  Weight:      Height:        Intake/Output Summary (Last 24 hours) at 04/09/2021 0925 Last data filed at 04/09/2021 0500 Gross per 24 hour  Intake 640 ml  Output 1500 ml  Net -860 ml   Filed Weights   03/31/21 1521 04/01/21 0500  Weight: 100.7 kg 103.9 kg    Exam: No changes.  . General: 75 y.o. year-old male well-appearing no acute distress.  He is alert and oriented x3.   . Cardiovascular: Regular rate and rhythm no rubs or gallops.  Marland Kitchen Respiratory: Clear to auscultation  no wheezes or rales.   . Abdomen: Soft nontender normal bowel sounds present.  . Musculoskeletal: No lower extremity edema bilaterally. . Skin: Scrotum edematous and erythematous.   Marland Kitchen Psychiatry: Mood is appropriate for condition and setting.   Data Reviewed: CBC: Recent Labs  Lab 04/03/21 0840 04/04/21 0408 04/05/21 0415  WBC 8.5 7.2 7.1  NEUTROABS 5.6 4.6 4.0  HGB 7.9* 8.5* 9.2*  HCT 24.8* 27.0* 30.1*  MCV 87.6 87.7 88.0  PLT 287 305 [id number]   Basic Metabolic Panel: Recent Labs  Lab 04/03/21 0428 04/03/21 0840 04/04/21 0408 04/05/21 0415  NA 138  --  140 139  K 4.3  --  4.1 3.8  CL 107  --  107 107  CO2 24  --  25 24  GLUCOSE 206*  --  118* 110*  BUN 36*  --  26* 20  CREATININE 1.75*  --  1.40* 1.24  CALCIUM 8.5*  --  8.4* 8.8*  MG  --  2.4  --   --    GFR: Estimated Creatinine Clearance: 62.2 mL/min (by C-G formula based on SCr of 1.24 mg/dL). Liver Function Tests: No results for input(s): AST, ALT, ALKPHOS, BILITOT, PROT, ALBUMIN in the last 168 hours. No results for input(s): LIPASE, AMYLASE in the last 168 hours. No results for input(s): AMMONIA in the last 168 hours. Coagulation Profile: No  results for input(s): INR, PROTIME in the last 168 hours. Cardiac Enzymes: No results for input(s): CKTOTAL, CKMB, CKMBINDEX, TROPONINI in the last 168 hours. BNP (last 3 results) No results for input(s): PROBNP in the last 8760 hours. HbA1C: No results for input(s): HGBA1C in the last 72 hours. CBG: Recent Labs  Lab 04/08/21 0744 04/08/21 1205 04/08/21 1619 04/08/21 1937 04/09/21 0727  GLUCAP 104* 181* 196* 112* 141*   Lipid Profile: No results for input(s): CHOL, HDL, LDLCALC, TRIG, CHOLHDL, LDLDIRECT in the last 72 hours. Thyroid Function Tests: No results for input(s): TSH, T4TOTAL, FREET4, T3FREE, THYROIDAB in the last 72 hours. Anemia Panel: No results for input(s): VITAMINB12, FOLATE, FERRITIN, TIBC, IRON, RETICCTPCT in the last 72 hours. Urine  analysis:    Component Value Date/Time   COLORURINE YELLOW 04/01/2021 1603   APPEARANCEUR CLEAR 04/01/2021 1603   LABSPEC 1.014 04/01/2021 1603   PHURINE 5.0 04/01/2021 1603   GLUCOSEU NEGATIVE 04/01/2021 1603   HGBUR NEGATIVE 04/01/2021 1603   Novinger 04/01/2021 1603   KETONESUR NEGATIVE 04/01/2021 1603   PROTEINUR NEGATIVE 04/01/2021 1603   UROBILINOGEN 0.2 09/17/2012 1837   NITRITE NEGATIVE 04/01/2021 1603   LEUKOCYTESUR NEGATIVE 04/01/2021 1603   Sepsis Labs: @LABRCNTIP (procalcitonin:4,lacticidven:4)  ) Recent Results (from the past 240 hour(s))  Blood culture (routine single)     Status: None   Collection Time: 03/31/21  6:00 PM   Specimen: BLOOD  Result Value Ref Range Status   Specimen Description   Final    BLOOD BLOOD LEFT HAND Performed at [facility], [place] [address] [place], [place] [address]    Special Requests   Final    BOTTLES DRAWN AEROBIC AND ANAEROBIC Blood Culture adequate volume Performed at [facility] [address], [place], [place] [address]    Culture   Final    NO GROWTH 5 DAYS Performed at [facility], [place] [address] [place], [place] [address]    Report Status 04/05/2021 FINAL  Final  Resp Panel by RT-PCR (Flu A&B, Covid) Nasopharyngeal Swab     Status: Abnormal   Collection Time: 03/31/21  7:52 PM   Specimen: Nasopharyngeal Swab; Nasopharyngeal(NP) swabs in vial transport medium  Result Value Ref Range Status   SARS Coronavirus 2 by RT PCR POSITIVE (A) NEGATIVE Final    Comment: RESULT CALLED TO, READ BACK BY AND VERIFIED WITH: BANNO,A 03/31/21 @2147  BY SEELMOL (NOTE) SARS-CoV-2 target nucleic acids are DETECTED.  The SARS-CoV-2 RNA is generally detectable in upper respiratory specimens during the acute phase of infection. Positive results are indicative of the presence of the identified virus, but do not rule out bacterial infection or co-infection with other pathogens  not detected by the test. Clinical correlation with patient history and other diagnostic information is necessary to determine patient infection status. The expected result is Negative.  Fact Sheet for Patients: EntrepreneurPulse.com.au  Fact Sheet for Healthcare Providers: IncredibleEmployment.be  This test is not yet approved or cleared by the [place] FDA and  has been authorized for detection and/or diagnosis of SARS-CoV-2 by FDA under an Emergency Use Authorization (EUA).  This EUA will remain in effect (meaning this test can be  used) for the duration of  the COVID-19 declaration under Section 564(b)(1) of the Act, 21 U.S.C. section 360bbb-3(b)(1), unless the authorization is terminated or revoked sooner.     Influenza A by PCR NEGATIVE NEGATIVE Final   Influenza B by PCR NEGATIVE NEGATIVE Final    Comment: (  NOTE) The Xpert Xpress SARS-CoV-2/FLU/RSV plus assay is intended as an aid in the diagnosis of influenza from Nasopharyngeal swab specimens and should not be used as a sole basis for treatment. Nasal washings and aspirates are unacceptable for Xpert Xpress SARS-CoV-2/FLU/RSV testing.  Fact Sheet for Patients: EntrepreneurPulse.com.au  Fact Sheet for Healthcare Providers: IncredibleEmployment.be  This test is not yet approved or cleared by the [place] FDA and has been authorized for detection and/or diagnosis of SARS-CoV-2 by FDA under an Emergency Use Authorization (EUA). This EUA will remain in effect (meaning this test can be used) for the duration of the COVID-19 declaration under Section 564(b)(1) of the Act, 21 U.S.C. section 360bbb-3(b)(1), unless the authorization is terminated or revoked.  Performed at [facility], [place] [address], [place], [place] [address]   MRSA PCR Screening     Status: None   Collection Time: 04/01/21  3:45 AM   Specimen: Nasal  Mucosa; Nasopharyngeal  Result Value Ref Range Status   MRSA by PCR NEGATIVE NEGATIVE Final    Comment:        The GeneXpert MRSA Assay (FDA approved for NASAL specimens only), is one component of a comprehensive MRSA colonization surveillance program. It is not intended to diagnose MRSA infection nor to guide or monitor treatment for MRSA infections. Performed at [facility], [place] [address] [place], [place] [address]   Culture, blood (Routine X 2) w Reflex to ID Panel     Status: None   Collection Time: 04/01/21  7:06 AM   Specimen: BLOOD  Result Value Ref Range Status   Specimen Description   Final    BLOOD LEFT HAND Performed at [facility] [address], [place], [place] [address]    Special Requests   Final    BOTTLES DRAWN AEROBIC ONLY Blood Culture adequate volume Performed at [facility] [address], [place], [place] [address]    Culture   Final    NO GROWTH 5 DAYS Performed at [facility], [place] [address], [place], [place] [address]    Report Status 04/06/2021 FINAL  Final  Aerobic/Anaerobic Culture w Gram Stain (surgical/deep wound)     Status: None   Collection Time: 04/01/21 10:29 AM   Specimen: PATH Other; Tissue  Result Value Ref Range Status   Specimen Description   Final    PERINEAL ABCESS Performed at [facility] [address] [place], [place] [address]    Special Requests   Final    NONE Performed at [facility], [place] [address], [place], [place] [address]    Gram Stain   Final    MODERATE WBC PRESENT, PREDOMINANTLY PMN FEW GRAM POSITIVE COCCI FEW GRAM NEGATIVE RODS    Culture   Final    FEW ESCHERICHIA COLI FEW BACTEROIDES FRAGILIS BETA LACTAMASE POSITIVE Performed at [facility], [place] [address], [place], [place] [address]    Report Status 04/04/2021 FINAL  Final   Organism ID, Bacteria ESCHERICHIA COLI  Final       Susceptibility   Escherichia coli - MIC*    AMPICILLIN <=2 SENSITIVE Sensitive     CEFAZOLIN <=4 SENSITIVE Sensitive     CEFEPIME <=0.12 SENSITIVE Sensitive     CEFTAZIDIME <=1 SENSITIVE Sensitive     CEFTRIAXONE <=0.25 SENSITIVE Sensitive     CIPROFLOXACIN <=0.25 SENSITIVE Sensitive     GENTAMICIN <=1 SENSITIVE Sensitive     IMIPENEM <=0.25  SENSITIVE Sensitive     TRIMETH/SULFA <=20 SENSITIVE Sensitive     AMPICILLIN/SULBACTAM <=2 SENSITIVE Sensitive     PIP/TAZO <=4 SENSITIVE Sensitive     * FEW ESCHERICHIA COLI  Culture, Urine     Status: None   Collection Time: 04/01/21  4:03 PM   Specimen: Urine, Clean Catch  Result Value Ref Range Status   Specimen Description   Final    URINE, CLEAN CATCH Performed at [facility], [place] [address] [place], [place] [address]    Special Requests   Final    NONE Performed at [facility], [place] [address], [place], [place] [address]    Culture   Final    NO GROWTH Performed at [facility], [place] [address] [place], [place] [address]    Report Status 04/02/2021 FINAL  Final  C Difficile Quick Screen (NO PCR Reflex)     Status: None   Collection Time: 04/05/21  1:25 PM   Specimen: STOOL  Result Value Ref Range Status   C Diff antigen NEGATIVE NEGATIVE Final   C Diff toxin NEGATIVE NEGATIVE Final   C Diff interpretation No C. difficile detected.  Final    Comment: Performed at [facility], [place] [address] [place], [place] [address]      Studies: No results found.  Scheduled Meds: . (feeding supplement) PROSource Plus  30 mL Oral TID BM  . acetaminophen  650 mg Oral Q6H  . amitriptyline  50 mg Oral QHS  . amLODipine  10 mg Oral Daily  . atorvastatin  20 mg Oral QHS  . buPROPion  150 mg Oral Daily  . carvedilol  3.125 mg Oral BID WC  . Chlorhexidine Gluconate Cloth  6 each Topical Daily  . diphenoxylate-atropine  2 tablet Oral BID  . enoxaparin (LOVENOX) injection  40  mg Subcutaneous Q24H  . febuxostat  40 mg Oral QHS  . ferrous sulfate  325 mg Oral TID WC  . gabapentin  600 mg Oral QHS  . insulin aspart  0-9 Units Subcutaneous TID WC  . insulin glargine  15 Units Subcutaneous QHS  . mouth rinse  15 mL Mouth Rinse BID  . multivitamin with minerals  1 tablet Oral Daily  . polycarbophil  625 mg Oral BID  . Ensure Max Protein  11 oz Oral BID  . sodium chloride flush  10-40 mL Intracatheter Q12H  . tamsulosin  0.4 mg Oral QPC supper  . tetrahydrozoline  3 drop Both Eyes Daily  . vitamin B-12  1,000 mcg Oral Daily    Continuous Infusions:    LOS: 9 days     Kayleen Memos, MD [facility] Pager [phone]  If 7PM-7AM, please contact night-coverage [email or web address] Password [facility] 04/09/2021, 9:25 AM

## 2021-04-10 DIAGNOSIS — N493 Fournier gangrene: Secondary | ICD-10-CM | POA: Diagnosis not present

## 2021-04-10 LAB — GLUCOSE, CAPILLARY
Glucose-Capillary: 124 mg/dL — ABNORMAL HIGH (ref 70–99)
Glucose-Capillary: 169 mg/dL — ABNORMAL HIGH (ref 70–99)
Glucose-Capillary: 175 mg/dL — ABNORMAL HIGH (ref 70–99)
Glucose-Capillary: 135 mg/dL — ABNORMAL HIGH (ref 70–99)

## 2021-04-10 MED ORDER — CARVEDILOL 6.25 MG PO TABS
6.2500 mg | ORAL_TABLET | Freq: Two times a day (BID) | ORAL | Status: DC
  Administered 2021-04-10 (×2): 6.25 mg via ORAL
  Filled 2021-04-10 (×2): qty 1

## 2021-04-10 NOTE — TOC Progression Note (Addendum)
Transition of Care [facility]) - Progression Note    Patient Details  Name: [name] MRN: [id number] Date of Birth: 1946/07/04  Transition [facility] CM/SW [place], [place] Phone Number: 04/10/2021, 2:01 PM  Clinical Narrative:   Confirmed with Maudie Mercury that she has a bed at [facility] in [place] today.  She and I spoke about the need to have confirmation from the [place] re: authorization for payment.  I have left multiple messages for [name] [phone], as has Maudie Mercury.  Have gotten no return call.  TOC will continue to follow during the course of hospitalization.  Addendum:  [id number] [phone], number given as an alternative on [name] voice mail message.  Left message.     Expected Discharge Plan: Home/Self Care Barriers to Discharge: Other (must enter comment) (await [facility] decision on short [facility] SNF coverage; pt and wife without a home to return to (remodeling))  Expected Discharge Plan and Services Expected Discharge Plan: Home/Self Care   Discharge Planning Services: CM Consult   Living arrangements for the past 2 months: Single Family Home Expected Discharge Date: 04/06/21                                     Social Determinants of Health (SDOH) Interventions    Readmission Risk Interventions No flowsheet data found.

## 2021-04-10 NOTE — Progress Notes (Signed)
PROGRESS NOTE  [name] KNL:[id number] DOB: [birth year] DOA: 03/31/2021 PCP: [facility]  HPI/Recap of past 24 hours: History of hypertension, type 2 diabetes, CVA in 2015, CKD 3A, anemia of chronic disease, gout, OSA, prostate cancer status post seeds placement in December 2021, previous perirectal abscess without fistula, with c/o 5 day h/o rectal pain, associated with scrotal pain and swelling, diarrhea.  Was at urologist office who noted fever 101.4, sent to ED.  Work-up in the ED revealed seen on CT A/P: soft tissue gas involving L and midline of perineum extending to posterior base of penis. No definate connection to anorectum. Concern for necrotizing fasciitis.  General surgery was consulted.  Status post I&D perirectal abscess with anal fistula and placement of seton by general surgery Dr. Marlou Starks  04/10/21:   [name] was seen this morning.  No new complaints.  Awaiting placement.  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 -Post incision and drainage of perirectal abscess with anal fistula and placement of seton per Dr. Marlou Starks 04/01/2021. -MRSA PCR negative. -Blood cultures with no growth to date. -Urine cultures negative. -Wound cultures with few gram-negative rods, few gram-positive cocci, susceptibilities, pansensitive. -Patient seen in consultation by ID who recommended discontinuation of IV clindamycin,  continuation of IV Zosyn while in-house with transition to Augmentin x5 more days on discharge.   -Start Augmentin x5 days as recommended by ID. -Appreciate ID input and recommendations  -Appreciate general surgery and urology consultative services. Continue pain control, IV Dilaudid for severe pain, Vicodin for moderate pain.  Resolved postop delirium Currently alert and oriented x3. Reorient if needed  Insulin-dependent type 2 diabetes mellitus presented with hypoglycemia/history of peripheral neuropathy -Patient noted on admission to have a blood glucose level of 55 initially on presentation received D10 in the ED and briefly on D5 half-normal saline. -Hemoglobin A1c 6.5 (04/01/2021) -CBG on 107 this morning  -Decrease Lantus dose to 15 units daily.  Continue SSI.  Resolving acute kidney injury on chronic kidney disease stage IIIa -Creatinine noted to be 2.58 on presentation. Creatinine downtrending 1.24 from 1.40.  GFR greater than 60. He appears to be at his baseline creatinine 1.2 with GFR greater than 60. -Likely secondary to prerenal azotemia in the setting of ACE inhibitor. -Urinalysis nitrite negative, leukocytes negative, negative protein. -Renal function improving daily and creatinine down to 1.40.  -Continue to hold ACE inhibitor. -Outpatient follow-up.  Anemia of chronic disease. -Likely anemia of chronic disease. -Hemoglobin uptrending 9.2 from 8.5. -Transfusion threshold hemoglobin  < 7.   Hypertension BP is currently not at goal, elevated. -Continue Coreg. -Continue Norvasc 10 mg daily.  -Continue to hold ACE inhibitor secondary to acute kidney injury.  -Outpatient follow-up.  History of prostate cancer status post seeds -Management Per urology.  Recent COVID-19 infection -Patient noted to have tested positive with COVID 2 weeks prior to admission currently asymptomatic. -Repeat testing on admission remains positive. -Patient not hypoxic. -Chest  x-ray with  no acute findings. -Currently not requiring airborne precautions.   DVT prophylaxis: SCDs/subcu Lovenox daily. Code Status: Full Family Communication:  Updated his wife via phone on 04/06/2021. Disposition:   Status is: Inpatient    Dispo: The patient is from: Home  Anticipated d/c is to:  Home with home health PT.  Patient currently being treated for perirectal abscess, hopefully discharge in the next 24 to 48 hours.               Difficult to place patient not applicable.       Consultants:   Urology: Dr. Lovena Neighbours 04/01/2021  General surgery: Dr. Marlou Starks 04/01/2021  Infectious disease: Dr.Comer 04/03/2021  Procedures:  CT abdomen and pelvis 03/31/2021  Chest x-ray 03/31/2021  Incision and drainage perirectal abscess with anal fistula, placement of seton per Dr. Marlou Starks 04/01/2021  Antimicrobials:   IV Zosyn 03/31/2021>>>> 04/06/2021  IV clindamycin 03/31/2021>>>> 04/03/2021  IV vancomycin 5/13/ 2022x1 dose  Augmentin 04/06/2021 x5 days     Objective: Vitals:   04/09/21 1300 04/09/21 2159 04/10/21 0625 04/10/21 1403  BP: (!) 159/76 (!) 173/73 (!) 157/72 (!) 151/68  Pulse: 76 77 76 75  Resp: 17 15 18 18   Temp: 98.8 F (37.1 C) 98.3 F (36.8 C) 98.3 F (36.8 C) 98.4 F (36.9 C)  TempSrc: Oral Oral Oral Oral  SpO2: 99% 98% 97% 100%  Weight:      Height:        Intake/Output Summary (Last 24 hours) at 04/10/2021 1432 Last data filed at 04/10/2021 1000 Gross per 24 hour  Intake 600 ml  Output 1025 ml  Net -425 ml   Filed Weights   03/31/21 1521 04/01/21 0500  Weight: 100.7 kg 103.9 kg    Exam: No changes.  . General: 75 y.o. year-old male well-appearing no acute distress.  He is alert and oriented x3.   . Cardiovascular: Regular rate and rhythm no rubs or gallops.  Marland Kitchen Respiratory: Clear to auscultation no wheezes or rales.   . Abdomen: Soft nontender normal bowel sounds present.  . Musculoskeletal: No lower  extremity edema bilaterally. . Skin: Scrotum edematous and erythematous.   Marland Kitchen Psychiatry: Mood is appropriate for condition and setting.   Data Reviewed: CBC: Recent Labs  Lab 04/04/21 0408 04/05/21 0415  WBC 7.2 7.1  NEUTROABS 4.6 4.0  HGB 8.5* 9.2*  HCT 27.0* 30.1*  MCV 87.7 88.0  PLT 305 782   Basic Metabolic Panel: Recent Labs  Lab 04/04/21 0408 04/05/21 0415  NA 140 139  K 4.1 3.8  CL 107 107  CO2 25 24  GLUCOSE 118* 110*  BUN 26* 20  CREATININE 1.40* 1.24  CALCIUM 8.4* 8.8*   GFR: Estimated Creatinine Clearance: 62.2 mL/min (by C-G formula based on SCr of 1.24 mg/dL). Liver Function Tests: No results for input(s): AST, ALT, ALKPHOS, BILITOT, PROT, ALBUMIN in the last 168 hours. No results for input(s): LIPASE, AMYLASE in the last 168 hours. No results for input(s): AMMONIA in the last 168 hours. Coagulation Profile: No results for input(s): INR, PROTIME in the last 168 hours. Cardiac Enzymes: No results for input(s): CKTOTAL, CKMB, CKMBINDEX, TROPONINI in the last 168 hours. BNP (last 3 results) No results for input(s): PROBNP in the last 8760 hours. HbA1C: No results for input(s): HGBA1C in the last 72 hours. CBG: Recent Labs  Lab 04/09/21 1137 04/09/21 1614 04/09/21 2200 04/10/21 0730 04/10/21 1141  GLUCAP 148* 141* 112* 124* 169*   Lipid Profile: No results for input(s): CHOL,  HDL, LDLCALC, TRIG, CHOLHDL, LDLDIRECT in the last 72 hours. Thyroid Function Tests: No results for input(s): TSH, T4TOTAL, FREET4, T3FREE, THYROIDAB in the last 72 hours. Anemia Panel: No results for input(s): VITAMINB12, FOLATE, FERRITIN, TIBC, IRON, RETICCTPCT in the last 72 hours. Urine analysis:    Component Value Date/Time   COLORURINE YELLOW 04/01/2021 1603   APPEARANCEUR CLEAR 04/01/2021 1603   LABSPEC 1.014 04/01/2021 1603   PHURINE 5.0 04/01/2021 1603   GLUCOSEU NEGATIVE 04/01/2021 1603   HGBUR NEGATIVE 04/01/2021 1603   [place] 04/01/2021  1603   KETONESUR NEGATIVE 04/01/2021 1603   PROTEINUR NEGATIVE 04/01/2021 1603   UROBILINOGEN 0.2 09/17/2012 1837   NITRITE NEGATIVE 04/01/2021 1603   LEUKOCYTESUR NEGATIVE 04/01/2021 1603   Sepsis Labs: @LABRCNTIP (procalcitonin:4,lacticidven:4)  ) Recent Results (from the past 240 hour(s))  Blood culture (routine single)     Status: None   Collection Time: 03/31/21  6:00 PM   Specimen: BLOOD  Result Value Ref Range Status   Specimen Description   Final    BLOOD BLOOD LEFT HAND Performed at [facility], [place] [address], [place], [place] [address]    Special Requests   Final    BOTTLES DRAWN AEROBIC AND ANAEROBIC Blood Culture adequate volume Performed at [place] [address] [place], [place] [address]    Culture   Final    NO GROWTH 5 DAYS Performed at [facility], [place] [address], [place],  [address]    Report Status 04/05/2021 FINAL  Final  Resp Panel by RT-PCR (Flu A&B, Covid) Nasopharyngeal Swab     Status: Abnormal   Collection Time: 03/31/21  7:52 PM   Specimen: Nasopharyngeal Swab; Nasopharyngeal(NP) swabs in vial transport medium  Result Value Ref Range Status   SARS Coronavirus 2 by RT PCR POSITIVE (A) NEGATIVE Final    Comment: RESULT CALLED TO, READ BACK BY AND VERIFIED WITH: BANNO,A 03/31/21 @2147  BY SEELMOL (NOTE) SARS-CoV-2 target nucleic acids are DETECTED.  The SARS-CoV-2 RNA is generally detectable in upper respiratory specimens during the acute phase of infection. Positive results are indicative of the presence of the identified virus, but do not rule out bacterial infection or co-infection with other pathogens not detected by the test. Clinical correlation with patient history and other diagnostic information is necessary to determine patient infection status. The expected result is Negative.  Fact Sheet for Patients: EntrepreneurPulse.com.au  Fact Sheet for Healthcare  Providers: IncredibleEmployment.be  This test is not yet approved or cleared by the [place] FDA and  has been authorized for detection and/or diagnosis of SARS-CoV-2 by FDA under an Emergency Use Authorization (EUA).  This EUA will remain in effect (meaning this test can be  used) for the duration of  the COVID-19 declaration under Section 564(b)(1) of the Act, 21 U.S.C. section 360bbb-3(b)(1), unless the authorization is terminated or revoked sooner.     Influenza A by PCR NEGATIVE NEGATIVE Final   Influenza B by PCR NEGATIVE NEGATIVE Final    Comment: (NOTE) The Xpert Xpress SARS-CoV-2/FLU/RSV plus assay is intended as an aid in the diagnosis of influenza from Nasopharyngeal swab specimens and should not be used as a sole basis for treatment. Nasal washings and aspirates are unacceptable for Xpert Xpress SARS-CoV-2/FLU/RSV testing.  Fact Sheet for Patients: EntrepreneurPulse.com.au  Fact Sheet for Healthcare Providers: IncredibleEmployment.be  This test is not yet approved or cleared by the [place] FDA and has been authorized for detection and/or diagnosis of SARS-CoV-2 by  FDA under an Emergency Use Authorization (EUA). This EUA will remain in effect (meaning this test can be used) for the duration of the COVID-19 declaration under Section 564(b)(1) of the Act, 21 U.S.C. section 360bbb-3(b)(1), unless the authorization is terminated or revoked.  Performed at [facility], [place] [address], [place], [place] [address]   MRSA PCR Screening     Status: None   Collection Time: 04/01/21  3:45 AM   Specimen: Nasal Mucosa; Nasopharyngeal  Result Value Ref Range Status   MRSA by PCR NEGATIVE NEGATIVE Final    Comment:        The GeneXpert MRSA Assay (FDA approved for NASAL specimens only), is one component of a comprehensive MRSA colonization surveillance program. It is not intended to diagnose  MRSA infection nor to guide or monitor treatment for MRSA infections. Performed at [facility], [place] [address] [place], [place] [address]   Culture, blood (Routine X 2) w Reflex to ID Panel     Status: None   Collection Time: 04/01/21  7:06 AM   Specimen: BLOOD  Result Value Ref Range Status   Specimen Description   Final    BLOOD LEFT HAND Performed at [place] [address], [place], [place] [address]    Special Requests   Final    BOTTLES DRAWN AEROBIC ONLY Blood Culture adequate volume Performed at [place] [address], [place], [place] [address]    Culture   Final    NO GROWTH 5 DAYS Performed at [facility], [place] [address] [place], [place] [address]    Report Status 04/06/2021 FINAL  Final  Aerobic/Anaerobic Culture w Gram Stain (surgical/deep wound)     Status: None   Collection Time: 04/01/21 10:29 AM   Specimen: PATH Other; Tissue  Result Value Ref Range Status   Specimen Description   Final    PERINEAL ABCESS Performed at [place] [address] [place], [place] [address]    Special Requests   Final    NONE Performed at [facility], [place] [address] [place], [place] [address]    Gram Stain   Final    MODERATE WBC PRESENT, PREDOMINANTLY PMN FEW GRAM POSITIVE COCCI FEW GRAM NEGATIVE RODS    Culture   Final    FEW ESCHERICHIA COLI FEW BACTEROIDES FRAGILIS BETA LACTAMASE POSITIVE Performed at [facility], [place] [address] [place], [place] [address]    Report Status 04/04/2021 FINAL  Final   Organism ID, Bacteria ESCHERICHIA COLI  Final      Susceptibility   Escherichia coli - MIC*    AMPICILLIN <=2 SENSITIVE Sensitive     CEFAZOLIN <=4 SENSITIVE Sensitive     CEFEPIME <=0.12 SENSITIVE Sensitive     CEFTAZIDIME <=1 SENSITIVE Sensitive     CEFTRIAXONE <=0.25 SENSITIVE Sensitive     CIPROFLOXACIN <=0.25 SENSITIVE Sensitive      GENTAMICIN <=1 SENSITIVE Sensitive     IMIPENEM <=0.25 SENSITIVE Sensitive     TRIMETH/SULFA <=20 SENSITIVE Sensitive     AMPICILLIN/SULBACTAM <=2 SENSITIVE Sensitive     PIP/TAZO <=4 SENSITIVE Sensitive     * FEW ESCHERICHIA COLI  Culture, Urine     Status: None   Collection Time: 04/01/21  4:03 PM   Specimen: Urine, Clean Catch  Result Value Ref Range Status   Specimen Description   Final    URINE, CLEAN CATCH Performed at [facility], [place] [address], [place], [place] [address]    Special Requests   Final    NONE Performed at [facility], [place] [address] [place], [place] [address]    Culture   Final    NO GROWTH Performed at [facility], [place] [address] [place], [place] [address]    Report Status 04/02/2021 FINAL  Final  C Difficile Quick Screen (NO PCR Reflex)     Status: None   Collection Time: 04/05/21  1:25 PM   Specimen: STOOL  Result Value Ref Range Status   C Diff antigen NEGATIVE NEGATIVE Final   C Diff toxin NEGATIVE NEGATIVE Final   C Diff interpretation No C. difficile detected.  Final    Comment: Performed at [facility], [place] [address] [place], [place] [address]      Studies: No results found.  Scheduled Meds: . (feeding supplement) PROSource Plus  30 mL Oral TID BM  . acetaminophen  650 mg Oral Q6H  . amitriptyline  50 mg Oral QHS  . amLODipine  10 mg Oral Daily  . atorvastatin  20 mg Oral QHS  . buPROPion  150 mg Oral Daily  . carvedilol  6.25 mg Oral BID WC  . Chlorhexidine Gluconate Cloth  6 each Topical Daily  . diphenoxylate-atropine  2 tablet Oral BID  . enoxaparin (LOVENOX) injection  40 mg Subcutaneous Q24H  . febuxostat  40 mg Oral QHS  . ferrous sulfate  325 mg Oral TID WC  . gabapentin  600 mg Oral QHS  . insulin aspart  0-9 Units Subcutaneous TID WC  . insulin glargine  15 Units Subcutaneous QHS  . mouth rinse  15 mL Mouth Rinse BID  . multivitamin with minerals  1  tablet Oral Daily  . polycarbophil  625 mg Oral BID  . Ensure Max Protein  11 oz Oral BID  . sodium chloride flush  10-40 mL Intracatheter Q12H  . tamsulosin  0.4 mg Oral QPC supper  . tetrahydrozoline  3 drop Both Eyes Daily  . vitamin B-12  1,000 mcg Oral Daily    Continuous Infusions:    LOS: 10 days     Kayleen Memos, MD [facility] Pager [phone]  If 7PM-7AM, please contact night-coverage [email or web address] Password [facility] 04/10/2021, 2:32 PM

## 2021-04-10 NOTE — Progress Notes (Signed)
[facility] Progress Note  9 Days Post-Op  Subjective: CC-  Feeling better each day. Still having loose stools (he reports ~6 yesterday), but this is improving. Denies n/v. Tolerating diet. Hoping to go to SNF today.  Objective: Vital signs in last 24 hours: Temp:  [98.3 F (36.8 C)-98.8 F (37.1 C)] 98.3 F (36.8 C) (05/23 0625) Pulse Rate:  [76-77] 76 (05/23 0625) Resp:  [15-18] 18 (05/23 0625) BP: (157-173)/(72-76) 157/72 (05/23 0625) SpO2:  [97 %-99 %] 97 % (05/23 0625) Last BM Date: 04/09/21  Intake/Output from previous day: 05/22 0701 - 05/23 0700 In: 480 [P.O.:480] Out: 1400 [Urine:1400] Intake/Output this shift: Total I/O In: 120 [P.O.:120] Out: 300 [Urine:300]  PE: Gen:  Alert, NAD, pleasant Lungs: Respiratory effort nonlabored Abd: soft, NT, ND GU: seton in place,L buttock wound stable, no significant cellulitis or induration present  Lab Results:  No results for input(s): WBC, HGB, HCT, PLT in the last 72 hours. BMET No results for input(s): NA, K, CL, CO2, GLUCOSE, BUN, CREATININE, CALCIUM in the last 72 hours. PT/INR No results for input(s): LABPROT, INR in the last 72 hours. CMP     Component Value Date/Time   NA 139 04/05/2021 0415   NA 147 (H) 08/21/2013 1032   K 3.8 04/05/2021 0415   K 3.6 08/21/2013 1032   CL 107 04/05/2021 0415   CL 112 (H) 08/21/2012 1132   CO2 24 04/05/2021 0415   CO2 21 (L) 08/21/2013 1032   GLUCOSE 110 (H) 04/05/2021 0415   GLUCOSE 72 08/21/2013 1032   GLUCOSE 107 (H) 08/21/2012 1132   BUN 20 04/05/2021 0415   BUN 20.0 08/21/2013 1032   CREATININE 1.24 04/05/2021 0415   CREATININE 1.8 (H) 08/21/2013 1032   CALCIUM 8.8 (L) 04/05/2021 0415   CALCIUM 9.1 08/21/2013 1032   PROT 6.6 04/01/2021 0706   PROT 6.9 08/21/2013 1032   ALBUMIN 2.7 (L) 04/01/2021 0706   ALBUMIN 3.6 08/21/2013 1032   AST 23 04/01/2021 0706   AST 22 08/21/2013 1032   ALT 20 04/01/2021 0706   ALT 26 08/21/2013 1032   ALKPHOS  104 04/01/2021 0706   ALKPHOS 58 08/21/2013 1032   BILITOT 0.6 04/01/2021 0706   BILITOT 0.22 08/21/2013 1032   GFRNONAA >60 04/05/2021 0415   GFRAA 54 (L) 01/23/2017 0430   Lipase  No results found for: LIPASE     Studies/Results: No results found.  Anti-infectives: Anti-infectives (From admission, onward)   Start     Dose/Rate Route Frequency Ordered Stop   04/06/21 2200  amoxicillin-clavulanate (AUGMENTIN) 875-125 MG per tablet 1 tablet  Status:  Discontinued        1 tablet Oral Every 12 hours 04/06/21 1657 04/07/21 0711   04/05/21 1600  piperacillin-tazobactam (ZOSYN) IVPB 3.375 g        3.375 g 12.5 mL/hr over 240 Minutes Intravenous Every 8 hours 04/05/21 1340 04/05/21 2039   04/02/21 2300  vancomycin (VANCOREADY) IVPB 1250 mg/250 mL  Status:  Discontinued        1,250 mg 166.7 mL/hr over 90 Minutes Intravenous Every 48 hours 03/31/21 2244 04/02/21 1045   04/01/21 0800  piperacillin-tazobactam (ZOSYN) IVPB 3.375 g  Status:  Discontinued        3.375 g 12.5 mL/hr over 240 Minutes Intravenous Every 8 hours 03/31/21 2243 04/05/21 1340   03/31/21 2245  clindamycin (CLEOCIN) IVPB 600 mg  Status:  Discontinued        600 mg 100 mL/hr over 30  Minutes Intravenous Every 8 hours 03/31/21 2231 04/03/21 1412   03/31/21 2245  vancomycin (VANCOREADY) IVPB 2000 mg/400 mL        2,000 mg 200 mL/hr over 120 Minutes Intravenous STAT 03/31/21 2236 04/01/21 0747   03/31/21 2115  piperacillin-tazobactam (ZOSYN) IVPB 3.375 g        3.375 g 100 mL/hr over 30 Minutes Intravenous  Once 03/31/21 2113 03/31/21 2321   03/31/21 2100  piperacillin-tazobactam (ZOSYN) IVPB 3.375 g  Status:  Discontinued        3.375 g 100 mL/hr over 30 Minutes Intravenous  Once 03/31/21 2056 03/31/21 2058   03/31/21 2100  piperacillin-tazobactam (ZOSYN) IVPB 4.5 g  Status:  Discontinued        4.5 g 200 mL/hr over 30 Minutes Intravenous  Once 03/31/21 2058 03/31/21 2112       Assessment/Plan IDDM AKI on CKD  stage III HTN Prostate cancer s/p seeds Recent COVID infection 2+ weeks ago with resolution in symptoms   Perirectal abscess and rectal ulceration  S/P EUA, I&D, placement of seton 04/01/21 Dr. Marlou Starks  - POD#9 -seton in placewithout significant cellulitis - sitz baths/shower3-4x daily or more often if needed with bowel function. Continue antidiarrheals  - do not re-pack incision  - completed postop antibiotics - At some point patient would benefit from attempted fistula repair in 6-12 weeks at the soonest. Need time for the infection to resolve, wound closed wound, fistula to mature. 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 He is very high risk for recurrence given his radiated rectum and morbid obesity and diabetes - Ok for discharge from surgical standpoint. Discharge instructions and follow up info on AVS. Likely going to SNF today. We will sign off, please call with questions or concerns.   FEN: CM diet VTE: SCDs, lovenox ID: none currently   LOS: 10 days    Wellington [place], [facility] 04/10/2021, 10:23 AM Please see Amion for pager number during day hours 7:00am-4:30pm

## 2021-04-11 DIAGNOSIS — N493 Fournier gangrene: Secondary | ICD-10-CM | POA: Diagnosis not present

## 2021-04-11 LAB — GLUCOSE, CAPILLARY
Glucose-Capillary: 123 mg/dL — ABNORMAL HIGH (ref 70–99)
Glucose-Capillary: 162 mg/dL — ABNORMAL HIGH (ref 70–99)

## 2021-04-11 MED ORDER — CARVEDILOL 12.5 MG PO TABS
12.5000 mg | ORAL_TABLET | Freq: Two times a day (BID) | ORAL | Status: DC
  Administered 2021-04-11: 12.5 mg via ORAL
  Filled 2021-04-11: qty 1

## 2021-04-11 MED ORDER — OXYCODONE HCL 5 MG PO TABS: 5.0000 mg | ORAL_TABLET | Freq: Two times a day (BID) | ORAL | 0 refills | Status: AC | PRN

## 2021-04-11 MED ORDER — ENSURE MAX PROTEIN PO LIQD: 11.0000 [oz_av] | Freq: Two times a day (BID) | ORAL | 0 refills | Status: AC

## 2021-04-11 MED ORDER — ADULT MULTIVITAMIN W/MINERALS CH: 1.0000 | ORAL_TABLET | Freq: Every day | ORAL | 0 refills | Status: AC

## 2021-04-11 MED ORDER — INSULIN GLARGINE 100 UNIT/ML ~~LOC~~ SOLN: 15.0000 [IU] | Freq: Every day | SUBCUTANEOUS | 0 refills | Status: AC

## 2021-04-11 MED ORDER — PROSOURCE PLUS PO LIQD: 30.0000 mL | Freq: Three times a day (TID) | ORAL | 0 refills | Status: AC

## 2021-04-11 NOTE — TOC Transition Note (Signed)
Transition of Care [facility]) - CM/SW Discharge Note   Patient Details  Name: [name] MRN: [id number] Date of Birth: 12/19/1945  [facility] CM/SW Contact:  Lennart Pall, LCSW Phone Number: 04/11/2021, 1:04 PM   Clinical Narrative:    Spoke with pt and wife this morning to inform that SNF has received VA authorization.  They both report they feel he has continued to make good progress and they would now like to change dc plan to home dc with [place] services.  Have alerted MD/RN.  Pt medically ready for hospital dc.  Wife confirms that they will return to son's home [facility]).  No HH agency pref.  Referral placed with [facility].  No further TOC needs.   Final next level of care: [place] Barriers to Discharge: Barriers Resolved   Patient Goals and CMS Choice Patient states their goals for this hospitalization and ongoing recovery are:: to go home CMS Medicare.gov Compare Post Acute Care list provided to:: Patient Choice offered to / list presented to : Patient  Discharge Placement                       Discharge Plan and Services   Discharge Planning Services: CM Consult            DME Arranged: N/A DME Agency: NA       HH Arranged: PT,OT [place] Agency: [place] ([place]) Date [place]: 04/11/21 Time [place]: 0093 Representative spoke with at [place]: Ramond Marrow B.  Social Determinants of Health (SDOH) Interventions     Readmission Risk Interventions Readmission Risk Prevention Plan 04/11/2021  Transportation Screening Complete  PCP or Specialist Appt within 5-7 Days Complete  Home Care Screening Complete  Some recent data might be hidden

## 2021-04-11 NOTE — Progress Notes (Signed)
Physical Therapy Treatment Patient Details Name: [name] MRN: [id number] DOB: 04-Apr-1946 Today's Date: 04/11/2021    History of Present Illness Patient admitted 03/31/2021 for perirectal abcess.  S/PEXAM UNDER ANESTHESIA, INCISION AND DRAINAGE PERIRECTAL ABSCESS WITH ANAL FISTULA, PLACEMENT OF SETON on %/14. PMH: TKA, THA, stroke, peripheral neuropathy.    PT Comments    Pt progressing however continues to present with decr balance and endurance; would benefit from OPPT  Follow Up Recommendations  OPPT     Equipment Recommendations  None recommended by PT    Recommendations for Other Services       Precautions / Restrictions Precautions Precautions: Fall Precaution Comments: prerirectal drain Restrictions Weight Bearing Restrictions: No    Mobility  Bed Mobility Overal bed mobility: Modified Independent                  Transfers Overall transfer level: Needs assistance Equipment used: None Transfers: Sit to/from Stand Sit to Stand: Supervision         General transfer comment: for safety, no physical assist  Ambulation/Gait Ambulation/Gait assistance: Supervision;Min guard;Min assist Gait Distance (Feet): 300 Feet Assistive device: IV Pole;None Gait Pattern/deviations: Step-through pattern;Decreased stride length     General Gait Details: IV pole push, pt uses a cane at home. pt fatigues easily, supervision for safety to  min-guard with incr distance. pt with LOB x3 with balance challenges and min to min/guard for recovery   Stairs             Wheelchair Mobility    Modified Rankin (Stroke Patients Only)       Balance Overall balance assessment: Needs assistance Sitting-balance support: No upper extremity supported;Feet supported Sitting balance-Leahy Scale: Good     Standing balance support: Single extremity supported Standing balance-Leahy Scale: Fair               High level balance activites: Side stepping;Sudden  stops;Head turns High Level Balance Comments: pt with LOB with challenges while using IV pole (simulating use of cane as pt uses this  at baseline); min to min-guard to recover safely            Cognition Arousal/Alertness: Awake/alert Behavior During Therapy: WFL for tasks assessed/performed Overall Cognitive Status: Within Functional Limits for tasks assessed                                        Exercises      General Comments        Pertinent Vitals/Pain Pain Assessment: No/denies pain    Home Living                      Prior Function            PT Goals (current goals can now be found in the care plan section) Acute Rehab PT Goals Patient Stated Goal: to go home PT Goal Formulation: With patient Time For Goal Achievement: 04/18/21 Potential to Achieve Goals: Good Progress towards PT goals: Progressing toward goals    Frequency    Min 3X/week      PT Plan Current plan remains appropriate    Co-evaluation              AM-PAC PT "6 Clicks" Mobility   Outcome Measure  Help needed turning from your back to your side while in a flat bed without using bedrails?: A Little  Help needed moving from lying on your back to sitting on the side of a flat bed without using bedrails?: A Little Help needed moving to and from a bed to a chair (including a wheelchair)?: A Little Help needed standing up from a chair using your arms (e.g., wheelchair or bedside chair)?: A Little Help needed to walk in hospital room?: A Little Help needed climbing 3-5 steps with a railing? : A Lot 6 Click Score: 17    End of Session Equipment Utilized During Treatment: Gait belt Activity Tolerance: Patient tolerated treatment well Patient left: in bed;with call bell/phone within reach;with family/visitor present   PT Visit Diagnosis: Unsteadiness on feet (R26.81);Difficulty in walking, not elsewhere classified (R26.2);Muscle weakness (generalized)  (M62.81)     Time: 0923-3007 PT Time Calculation (min) (ACUTE ONLY): 21 min  Charges:  $Gait Training: 8-22 mins                     Baxter Flattery, PT  Acute Rehab Dept ([place]) [phone] Pager [phone]  04/11/2021    [facility] 04/11/2021, 1:27 PM

## 2021-04-11 NOTE — Discharge Summary (Addendum)
Discharge Summary  [name]:[id number] DOB: 1946/06/28  PCP: [facility]  Admit date: 03/31/2021 Discharge date: 04/11/2021  Time spent: 35 minutes.  Recommendations for Outpatient Follow-up:  1. Follow-up with general surgery in 1 to 2 weeks. 2. Follow-up with your primary care provider in 1 to 2 weeks. 3. Take your medications as prescribed. 4. Continue PT OT with assistance and fall precautions.  Discharge Diagnoses:  Active Hospital Problems   Diagnosis Date Noted  . Fournier's gangrene 03/31/2021  . 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 Renal failure (ARF), acute on chronic ([place]) 04/03/2021  . Perirectal abscess   . Sepsis with acute renal failure without septic shock ([place])   . Cellulitis of perineum 03/31/2021  . AKI (acute kidney injury) ([place]) 03/31/2021  . Malignant neoplasm of prostate ([place]) 10/04/2020  . CKD (chronic kidney disease) 09/18/2012  . DM (diabetes mellitus) ([place]) 09/18/2012  . HTN (hypertension) 09/18/2012  . Anemia 09/18/2012    Resolved Hospital Problems  No resolved problems to display.    Discharge Condition: Stable.  Diet recommendation: Resume previous diet.  Vitals:   04/10/21 2040 04/11/21 0618  BP: (!) 156/78 (!) 141/61  Pulse: 62 62  Resp: 18 18  Temp: 97.7 F (36.5 C) 97.9 F (36.6 C)  SpO2: 98% 98%    History of present illness:  History of hypertension, type 2 diabetes, CVA in 2015, CKD 3A, anemia of chronic disease, gout, OSA, prostate cancer status post seeds placement in December 2021, previous perirectal abscess without fistula, with c/o 5 day h/o rectal pain, associated with scrotal pain and swelling, diarrhea.  Was at urologist office who noted fever 101.4, sent to ED.  Work-up in the ED revealed seen on CT A/P: soft tissue gas involving L and midline of perineum extending to posterior base of penis. No definate connection to anorectum. Concern for necrotizing fasciitis.  General  surgery was consulted.  Status post I&D perirectal abscess with anal fistula and placement of seton by general surgery Dr. Marlou Starks.  04/11/21:    Patient was seen and examined at his bedside.  He has no new complaint.  He is eager to go home.   Hospital Course:  Principal Problem:   Fournier's gangrene Active Problems:   DM (diabetes mellitus) ([place])   CKD (chronic kidney disease)   Anemia   HTN (hypertension)   Malignant neoplasm of prostate ([facility])   Cellulitis of perineum   AKI (acute kidney injury) ([place])   Perirectal abscess   Sepsis with acute renal failure without septic shock ([facility])   Renal failure (ARF), acute on chronic ([facility])   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  Perirectal abscess/necrotizing fasciitis/sepsis, POA -Patient presented with fever, leukocytosis, tachypnea, acute kidney injury noted to have a perirectal abscess. -Post incision and drainage of perirectal abscess with anal fistula and placement of seton per Dr. Marlou Starks 04/01/2021. -MRSA PCR negative. -Blood cultures with no growth FINAL. -Urine cultures negative. -Wound cultures with E-coli, bacteroides fragilis, beta lactamase positive, pansensitive. -Patient seen in consultation by ID who recommended discontinuation of IV clindamycin, continuation of IV Zosyn while in-house  Completed antibiotics as recommended by general surgery. Pain control.  Resolved postop delirium Currently alert and oriented x3.  Insulin-dependent type 2 diabetes mellitus presented with hypoglycemia/history of peripheral neuropathy -Patient noted on admission to have a blood glucose level of 55 initially on presentation received D10 in the ED and briefly on D5 half-normal saline. -Hemoglobin A1c 6.5 (04/01/2021) -CBG on  107 this morning -Decrease Lantus dose to 15 units daily. Follow up with your PCP  Resolved acute kidney injury on chronic kidney disease stage IIIa -Creatinine noted to be 2.58 on presentation. Creatinine 1.24  from 1.40.  GFR greater than 60. He appears to be at his baseline creatinine 1.2 with GFR greater than 60. -Likely secondary to prerenal azotemia in the setting of ACE inhibitor. -Urinalysis nitrite negative, leukocytes negative, negative protein. -Renal function improving daily and creatinine down to 1.24. -Continue to hold ACE inhibitor. Continue to avoid nephrotoxic agents. -Outpatient follow-up with PCP.  Anemia of chronic disease. -Likely anemia of chronic disease. -Hemoglobin uptrending 9.2K from 8.5K.  Hypertension BP is currently stable Continue home Coreg and Norvasc. Continue to hold off home lisinopril. Follow-up with your primary care provider outpatient.  History of prostate cancer status post seeds -Management Per urology.  Recent COVID-19 infection -Patient noted to have tested positive with COVID 2 weeks prior to admission currently asymptomatic. -Repeat testing on admission remains positive. -Patient not hypoxic. -Chest x-ray with no acute findings. -Currently not requiring airborne precautions.   Code Status:Full   Consultants:  Urology: Dr. Lovena Neighbours 04/01/2021  General surgery: Dr. Marlou Starks 04/01/2021  Infectious disease: Dr.Comer5/16/2022  Procedures:  CT abdomen and pelvis 03/31/2021  Chest x-ray 03/31/2021  Incision and drainage perirectal abscess with anal fistula, placement of seton per Dr. Marlou Starks 04/01/2021  Antimicrobials:   IV Zosyn 03/31/2021>>>> 04/06/2021  IV clindamycin 03/31/2021>>>>04/03/2021  IV vancomycin 5/13/ 2022x1 dose  Augmentin 04/06/2021 x5 days, discontinued early, no need to continue antibiotics per surgery.  Discharge Exam: BP (!) 141/61 (BP Location: Left Arm)   Pulse 62   Temp 97.9 F (36.6 C)   Resp 18   Ht 5\' 10"  (1.778 m)   Wt 103.9 kg   SpO2 98%   BMI 32.87 kg/m  . General: 75 y.o. year-old male well developed well nourished in no acute distress.  Alert and oriented x3. . Cardiovascular: Regular rate  and rhythm with no rubs or gallops.  No thyromegaly or JVD noted.   Marland Kitchen Respiratory: Clear to auscultation with no wheezes or rales. Good inspiratory effort. . Abdomen: Soft nontender nondistended with normal bowel sounds x4 quadrants. . Musculoskeletal: No lower extremity edema.  Marland Kitchen Psychiatry: Mood is appropriate for condition and setting  Discharge Instructions You were cared for by a hospitalist during your hospital stay. If you have any questions about your discharge medications or the care you received while you were in the hospital after you are discharged, you can call the unit and asked to speak with the hospitalist on call if the hospitalist that took care of you is not available. Once you are discharged, your primary care physician will handle any further medical issues. Please note that NO REFILLS for any discharge medications will be authorized once you are discharged, as it is imperative that you return to your primary care physician (or establish a relationship with a primary care physician if you do not have one) for your aftercare needs so that they can reassess your need for medications and monitor your lab values.   Allergies as of 04/11/2021   No Known Allergies     Medication List    STOP taking these medications   Cholecalciferol 50 MCG (2000 UT) Tabs   lisinopril 10 MG tablet Commonly known as: ZESTRIL     TAKE these medications   acetaminophen 500 MG tablet Commonly known as: TYLENOL Take 1,000 mg by mouth every 6 (six)  hours as needed for moderate pain.   amitriptyline 50 MG tablet Commonly known as: ELAVIL Take 50 mg by mouth at bedtime.   amLODipine 10 MG tablet Commonly known as: NORVASC Take 10 mg by mouth at bedtime.   aspirin EC 81 MG tablet Take 162 mg by mouth daily. Swallow whole.   atorvastatin 20 MG tablet Commonly known as: LIPITOR Take 20 mg by mouth at bedtime.   buPROPion 150 MG 24 hr tablet Commonly known as: WELLBUTRIN XL Take 150 mg  by mouth daily.   camphor-menthol lotion Commonly known as: SARNA Apply 1 application topically daily.   carvedilol 12.5 MG tablet Commonly known as: COREG Take 12.5 mg by mouth daily.   colchicine 0.6 MG tablet Take 0.6 mg by mouth daily as needed (gout).   Ensure Max Protein Liqd Take 330 mLs (11 oz total) by mouth 2 (two) times daily for 7 days.   (feeding supplement) PROSource Plus liquid Take 30 mLs by mouth 3 (three) times daily between meals.   EYE DROPS OP Place 3 drops into both eyes daily.   febuxostat 40 MG tablet Commonly known as: ULORIC Take 40 mg by mouth at bedtime.   ferrous sulfate 325 (65 FE) MG tablet Commonly known as: FerrouSul Take 1 tablet (325 mg total) by mouth 3 (three) times daily with meals. What changed: when to take this   gabapentin 300 MG capsule Commonly known as: NEURONTIN Take 600 mg by mouth at bedtime.   hydrocortisone 2.5 % cream Apply 1 application topically at bedtime.   hydrocortisone 25 MG suppository Commonly known as: ANUSOL-HC Place 1 suppository (25 mg total) rectally 2 (two) times daily.   insulin glargine 100 UNIT/ML injection Commonly known as: LANTUS Inject 0.15 mLs (15 Units total) into the skin at bedtime. What changed: how much to take   multivitamin with minerals Tabs tablet Take 1 tablet by mouth daily. Start taking on: Apr 12, 2021   neomycin-bacitracin-polymyxin ointment Commonly known as: NEOSPORIN Apply 1 application topically daily as needed for wound care.   oxyCODONE 5 MG immediate release tablet Commonly known as: Oxy IR/ROXICODONE Take 1 tablet (5 mg total) by mouth 2 (two) times daily as needed for up to 5 days for severe pain.   polycarbophil 625 MG tablet Commonly known as: FIBERCON Take 1 tablet (625 mg total) by mouth daily.   tamsulosin 0.4 MG Caps capsule Commonly known as: FLOMAX Take 1 capsule (0.4 mg total) by mouth daily after supper.   vitamin B-12 1000 MCG tablet Commonly  known as: CYANOCOBALAMIN Take 1,000 mcg by mouth daily.   VITAMIN B-6 PO Take 2 tablets by mouth daily.      No Known Allergies  Follow-up Information    [facility] [place]. Go on 04/18/2021.   Specialty: General Surgery Why: 11:15 AM for wound check. Please arrive 30 min prior to appointment time for check in. Bring photo ID and insurance information with you.  Contact information: [place] [address] [phone]        [facility] [place] [facility] Call in 1 day(s).   Why: Please call for a post hospital follow-up appointment. Contact information: [place] [address] [phone]        [facility] Follow up.   Why: to provide home health physical and occupational therapy Contact information: [phone]               The results of significant diagnostics  from this hospitalization (including imaging, microbiology, ancillary and laboratory) are listed below for reference.    Significant Diagnostic Studies: CT Abdomen Pelvis Wo Contrast  Result Date: 03/31/2021 CLINICAL DATA:  Abdominal pain and fever. Prostate cancer with seeds. Acute kidney injury. Rectal pain and fissure. EXAM: CT ABDOMEN AND PELVIS WITHOUT CONTRAST TECHNIQUE: Multidetector CT imaging of the abdomen and pelvis was performed following the standard protocol without IV contrast. COMPARISON:  Abdominopelvic CT 09/17/2012 FINDINGS: Lower chest: Bibasilar atelectasis. No pleural fluid. Coronary artery calcification or stents. Small hiatal hernia. Hepatobiliary: No focal hepatic abnormality on this noncontrast exam. Depending gallstones. There may be a also minimal wall calcification of the gallbladder fundus. No pericholecystic fat stranding. No biliary dilatation. Pancreas: Mild fatty atrophy.  No ductal dilatation or inflammation. Spleen: Normal in size without focal abnormality. Adrenals/Urinary Tract: Normal adrenal glands. No  hydronephrosis. No renal calculi. Trace bilateral perinephric edema, likely chronic. Decompressed ureters. No bladder wall thickening. Stomach/Bowel: Punctate metallic density in the stomach may be a clip. Small hiatal hernia. No small bowel obstruction, wall thickening, or inflammation. Appendix is not confidently visualized. No appendicitis. Metallic density in the cecum may represent a surgical clip. Moderate volume of colonic stool. Sigmoid colon is redundant. There is sigmoid colonic diverticulosis without diverticulitis. Vascular/Lymphatic: Aortic atherosclerosis. No aortic aneurysm. Prominent periportal nodes are likely reactive. Scattered bilateral inguinal nodes also likely reactive. Reproductive: Brachytherapy seeds in the prostate. Large right hydrocele. Other: There is soft tissue gas involving the left and midline of the perineum, extending to the base of the penis. No minimal adjacent ill-defined fluid. Soft tissue stranding which extends to the left gluteal crease. There is no definitive connection to the anorectum. No intra-abdominopelvic fluid collection. Small fat containing umbilical hernia. Fat containing bilateral inguinal hernias. Skin thickening and soft tissue edema of the left anterior abdominal wall typical of medication injection sites. Musculoskeletal: Right hip arthroplasty. No focal bone lesion or acute osseous abnormality. Lumbar degenerative change. IMPRESSION: 1. Soft tissue gas involving the left and midline of the perineum, extending to the posterior base of the penis. Soft tissue stranding which extends to the left gluteal crease. There is no definitive connection to the anorectum. While findings may represent an ill-defined perirectal fistula, the possibility of necrotizing fasciitis is raised given the patchy soft tissue air. 2. Cholelithiasis without CT findings of acute cholecystitis. 3. Colonic diverticulosis without diverticulitis. 4. Small hiatal hernia. 5. Fat containing  bilateral inguinal hernias. Aortic Atherosclerosis (ICD10-I70.0). These results were called by telephone at the time of interpretation on 03/31/2021 at 8:51 pm to provider Fredia Sorrow , who verbally acknowledged these results. Electronically Signed   By: Keith Rake M.D.   On: 03/31/2021 20:51   DG Chest Port 1 View  Result Date: 03/31/2021 CLINICAL DATA:  Weakness and fever. Recent COVID-19 positive. History of prostate carcinoma. EXAM: PORTABLE CHEST 1 VIEW COMPARISON:  October 20, 2020 chest radiograph; CT chest January 20, 2021 FINDINGS: There is mild scarring in the left base. Lungs elsewhere are clear. Heart size and pulmonary vascularity are normal. No adenopathy. No bone lesions. IMPRESSION: Mild scarring left base. No edema or airspace opacity. Heart size within normal limits. Electronically Signed   By: Lowella Grip III M.D.   On: 03/31/2021 15:23    Microbiology: Recent Results (from the past 240 hour(s))  Culture, Urine     Status: None   Collection Time: 04/01/21  4:03 PM   Specimen: Urine, Clean Catch  Result Value Ref Range Status  Specimen Description   Final    URINE, CLEAN CATCH Performed at [facility], [place] [address] [place], [place] [address]    Special Requests   Final    NONE Performed at [facility], [place] [address], [place], [place] [address]    Culture   Final    NO GROWTH Performed at [facility], [place] [address], [place], [place] [address]    Report Status 04/02/2021 FINAL  Final  C Difficile Quick Screen (NO PCR Reflex)     Status: None   Collection Time: 04/05/21  1:25 PM   Specimen: STOOL  Result Value Ref Range Status   C Diff antigen NEGATIVE NEGATIVE Final   C Diff toxin NEGATIVE NEGATIVE Final   C Diff interpretation No C. difficile detected.  Final    Comment: Performed at [facility], [place] [address] [place], [place] [address]     Labs: Basic Metabolic Panel: Recent  Labs  Lab 04/05/21 0415  NA 139  K 3.8  CL 107  CO2 24  GLUCOSE 110*  BUN 20  CREATININE 1.24  CALCIUM 8.8*   Liver Function Tests: No results for input(s): AST, ALT, ALKPHOS, BILITOT, PROT, ALBUMIN in the last 168 hours. No results for input(s): LIPASE, AMYLASE in the last 168 hours. No results for input(s): AMMONIA in the last 168 hours. CBC: Recent Labs  Lab 04/05/21 0415  WBC 7.1  NEUTROABS 4.0  HGB 9.2*  HCT 30.1*  MCV 88.0  PLT 307   Cardiac Enzymes: No results for input(s): CKTOTAL, CKMB, CKMBINDEX, TROPONINI in the last 168 hours. BNP: BNP (last 3 results) No results for input(s): BNP in the last 8760 hours.  ProBNP (last 3 results) No results for input(s): PROBNP in the last 8760 hours.  CBG: Recent Labs  Lab 04/10/21 1141 04/10/21 1646 04/10/21 2147 04/11/21 0736 04/11/21 1157  GLUCAP 169* 175* 135* 123* 162*       Signed:  Kayleen Memos, MD [facility] 04/11/2021, 1:41 PM

## 2023-02-08 ENCOUNTER — Encounter: Payer: Self-pay | Admitting: Gastroenterology

## 2023-04-08 ENCOUNTER — Encounter: Payer: Self-pay | Admitting: Gastroenterology

## 2023-05-17 ENCOUNTER — Other Ambulatory Visit: Payer: Self-pay

## 2023-05-17 ENCOUNTER — Encounter (HOSPITAL_COMMUNITY): Payer: Self-pay

## 2023-05-17 ENCOUNTER — Emergency Department (HOSPITAL_COMMUNITY)
Admission: EM | Admit: 2023-05-17 | Discharge: 2023-05-17 | Disposition: A | Discharge status: Home / Self Care | Payer: Non-veteran care | Attending: Emergency Medicine | Admitting: Emergency Medicine

## 2023-05-17 DIAGNOSIS — Z7984 Long term (current) use of oral hypoglycemic drugs: Secondary | ICD-10-CM | POA: Insufficient documentation

## 2023-05-17 DIAGNOSIS — I129 Hypertensive chronic kidney disease with stage 1 through stage 4 chronic kidney disease, or unspecified chronic kidney disease: Secondary | ICD-10-CM | POA: Diagnosis not present

## 2023-05-17 DIAGNOSIS — N189 Chronic kidney disease, unspecified: Secondary | ICD-10-CM | POA: Insufficient documentation

## 2023-05-17 DIAGNOSIS — Z8546 Personal history of malignant neoplasm of prostate: Secondary | ICD-10-CM | POA: Insufficient documentation

## 2023-05-17 DIAGNOSIS — Z794 Long term (current) use of insulin: Secondary | ICD-10-CM | POA: Insufficient documentation

## 2023-05-17 DIAGNOSIS — Z79899 Other long term (current) drug therapy: Secondary | ICD-10-CM | POA: Insufficient documentation

## 2023-05-17 DIAGNOSIS — Z7982 Long term (current) use of aspirin: Secondary | ICD-10-CM | POA: Insufficient documentation

## 2023-05-17 DIAGNOSIS — E1122 Type 2 diabetes mellitus with diabetic chronic kidney disease: Secondary | ICD-10-CM | POA: Insufficient documentation

## 2023-05-17 LAB — URINALYSIS, ROUTINE W REFLEX MICROSCOPIC
Bilirubin Urine: NEGATIVE
Glucose, UA: NEGATIVE mg/dL
Hgb urine dipstick: NEGATIVE
Ketones, ur: NEGATIVE mg/dL
Leukocytes,Ua: NEGATIVE
Nitrite: NEGATIVE
Protein, ur: NEGATIVE mg/dL
Specific Gravity, Urine: 1.017 (ref 1.005–1.030)
pH: 5 (ref 5.0–8.0)

## 2023-05-17 LAB — BASIC METABOLIC PANEL
Anion gap: 9 (ref 5–15)
BUN: 29 mg/dL — ABNORMAL HIGH (ref 8–23)
CO2: 19 mmol/L — ABNORMAL LOW (ref 22–32)
Calcium: 8.5 mg/dL — ABNORMAL LOW (ref 8.9–10.3)
Chloride: 110 mmol/L (ref 98–111)
Creatinine, Ser: 2.08 mg/dL — ABNORMAL HIGH (ref 0.61–1.24)
GFR, Estimated: 32 mL/min — ABNORMAL LOW (ref >60)
Glucose, Bld: 136 mg/dL — ABNORMAL HIGH (ref 70–99)
Potassium: 4 mmol/L (ref 3.5–5.1)
Sodium: 138 mmol/L (ref 135–145)

## 2023-05-17 LAB — CBC
HCT: 32.5 % — ABNORMAL LOW (ref 39.0–52.0)
Hemoglobin: 10.2 g/dL — ABNORMAL LOW (ref 13.0–17.0)
MCH: 27.4 pg (ref 26.0–34.0)
MCHC: 31.4 g/dL (ref 30.0–36.0)
MCV: 87.4 fL (ref 80.0–100.0)
Platelets: 200 10*3/uL (ref 150–400)
RBC: 3.72 MIL/uL — ABNORMAL LOW (ref 4.22–5.81)
RDW: 13.2 % (ref 11.5–15.5)
WBC: 8.6 10*3/uL (ref 4.0–10.5)
nRBC: 0 % (ref 0.0–0.2)

## 2023-05-17 LAB — CBG MONITORING, ED: Glucose-Capillary: 145 mg/dL — ABNORMAL HIGH (ref 70–99)

## 2023-05-17 MED ORDER — SODIUM CHLORIDE 0.9 % IV BOLUS
1000.0000 mL | Freq: Once | INTRAVENOUS | Status: AC
  Administered 2023-05-17: 1000 mL via INTRAVENOUS

## 2023-05-17 NOTE — ED Provider Notes (Signed)
[place] EMERGENCY DEPARTMENT AT [facility] Provider Note   CSN: [id number] Arrival date & time: 05/17/23  1617     History  Chief Complaint  Patient presents with   Abnormal Lab    [name] is a 77 y.o. male.  The history is provided by the patient, the spouse and medical records. No language interpreter was used.  Abnormal Lab    77 year old male significant Struve diabetes, chronic kidney disease, malignant neoplasm of the prostate, hypertension, presenting to the ED with concerns of abnormal labs.  Patient states he was seen by his PCP at the [place] several days for a 66-month checkup.  Blood work was done.  He was contacted today notifying that his kidney function is abnormal and he needs to come to the ER for further evaluation and management.  Patient overall without any acute complaint.  His wife did report patient seems to have exertional fatigue for the past 3 to 4 months.  Does endorse some chest discomfort with exertion.  Denies any significant cardiac history.  No recent chest pain.  No recent change in manage of medication.  States that he has been eating and drinking fine.  Does not endorse any back pain abdominal pain or difficulty with urination.  Home Medications Prior to Admission medications   Medication Sig Start Date End Date Taking? Authorizing Provider  acetaminophen (TYLENOL) 500 MG tablet Take 1,000 mg by mouth every 6 (six) hours as needed for moderate pain.    [provider]  amitriptyline (ELAVIL) 50 MG tablet Take 50 mg by mouth at bedtime.  10/06/11   [provider]  amLODipine (NORVASC) 10 MG tablet Take 10 mg by mouth at bedtime.    [provider]  aspirin EC 81 MG tablet Take 162 mg by mouth daily. Swallow whole.    [provider]  atorvastatin (LIPITOR) 20 MG tablet Take 20 mg by mouth at bedtime.    [provider]  buPROPion (WELLBUTRIN XL) 150 MG 24 hr tablet Take 150 mg by mouth daily.     [provider]  camphor-menthol Wynelle Fanny) lotion Apply 1 application topically daily. 09/05/20   [provider]  carvedilol (COREG) 12.5 MG tablet Take 12.5 mg by mouth daily.    [provider]  colchicine 0.6 MG tablet Take 0.6 mg by mouth daily as needed (gout).    [provider]  febuxostat (ULORIC) 40 MG tablet Take 40 mg by mouth at bedtime.    [provider]  ferrous sulfate (FERROUSUL) 325 (65 FE) MG tablet Take 1 tablet (325 mg total) by mouth 3 (three) times daily with meals. Patient taking differently: Take 325 mg by mouth daily. 01/22/17   Lanney Gins, PA-C  gabapentin (NEURONTIN) 300 MG capsule Take 600 mg by mouth at bedtime.    [provider]  hydrocortisone (ANUSOL-HC) 25 MG suppository Place 1 suppository (25 mg total) rectally 2 (two) times daily. 12/15/20   Bruning, Ashlyn, PA-C  hydrocortisone 2.5 % cream Apply 1 application topically at bedtime.    [provider]  insulin glargine (LANTUS) 100 UNIT/ML injection Inject 0.15 mLs (15 Units total) into the skin at bedtime. 04/11/21   Darlin Drop, DO  neomycin-bacitracin-polymyxin (NEOSPORIN) ointment Apply 1 application topically daily as needed for wound care.    [provider]  polycarbophil (FIBERCON) 625 MG tablet Take 1 tablet (625 mg total) by mouth daily. 04/05/21   Juliet Rude, PA-C  Pyridoxine  HCl (VITAMIN B-6 PO) Take 2 tablets by mouth daily.     [provider]  tamsulosin (FLOMAX) 0.4 MG CAPS capsule Take 1 capsule (0.4 mg total) by mouth daily after supper. 12/15/20   Bruning, Ashlyn, PA-C  Tetrahydrozoline HCl (EYE DROPS OP) Place 3 drops into both eyes daily.    [provider]  vitamin B-12 (CYANOCOBALAMIN) 1000 MCG tablet Take 1,000 mcg by mouth daily.    [provider]      Allergies    Patient has no known allergies.    Review of Systems   Review of Systems  All other systems reviewed and are  negative.   Physical Exam Updated Vital Signs BP 138/73   Pulse (!) 57   Temp 97.8 F (36.6 C) (Oral)   Resp 16   Ht 5\' 10"  (1.778 m)   Wt 102.1 kg   SpO2 99%   BMI 32.28 kg/m  Physical Exam Vitals and nursing note reviewed.  Constitutional:      General: He is not in acute distress.    Appearance: He is well-developed. He is obese.  HENT:     Head: Atraumatic.  Eyes:     Conjunctiva/sclera: Conjunctivae normal.  Cardiovascular:     Rate and Rhythm: Normal rate and regular rhythm.     Pulses: Normal pulses.     Heart sounds: Normal heart sounds.  Pulmonary:     Effort: Pulmonary effort is normal.     Breath sounds: Normal breath sounds. No wheezing, rhonchi or rales.  Abdominal:     Palpations: Abdomen is soft.     Tenderness: There is no abdominal tenderness. There is no right CVA tenderness or left CVA tenderness.  Musculoskeletal:        General: Normal range of motion.     Cervical back: Neck supple.  Skin:    Capillary Refill: Capillary refill takes less than 2 seconds.     Findings: No rash.  Neurological:     Mental Status: He is alert. Mental status is at baseline.     ED Results / Procedures / Treatments   Labs (all labs ordered are listed, but only abnormal results are displayed) Labs Reviewed  BASIC METABOLIC PANEL - Abnormal; Notable for the following components:      Result Value   CO2 19 (*)    Glucose, Bld 136 (*)    BUN 29 (*)    Creatinine, Ser 2.08 (*)    Calcium 8.5 (*)    GFR, Estimated 32 (*)    All other components within normal limits  CBC - Abnormal; Notable for the following components:   RBC 3.72 (*)    Hemoglobin 10.2 (*)    HCT 32.5 (*)    All other components within normal limits  URINALYSIS, ROUTINE W REFLEX MICROSCOPIC - Abnormal; Notable for the following components:   APPearance HAZY (*)    All other components within normal limits  CBG MONITORING, ED - Abnormal; Notable for the following components:    Glucose-Capillary 145 (*)    All other components within normal limits    EKG EKG Interpretation Date/Time:  Friday May 17 2023 16:53:15 EDT Ventricular Rate:  68 PR Interval:  200 QRS Duration:  147 QT Interval:  427 QTC Calculation: 455 R Axis:   -34  Text Interpretation: Sinus rhythm Right bundle branch block No significant change since last tracing Confirmed by Gwyneth Sprout ([id number]) on 05/17/2023 7:32:33 PM  Radiology No results found.  Procedures Procedures    Medications Ordered in ED Medications - No data to display  ED Course/ Medical Decision Making/ A&P                             Medical Decision Making Amount and/or Complexity of Data Reviewed Labs: ordered.   BP 138/73   Pulse (!) 57   Temp 97.8 F (36.6 C) (Oral)   Resp 16   Ht 5\' 10"  (1.778 m)   Wt 102.1 kg   SpO2 99%   BMI 32.28 kg/m   52:6 PM 77 year old male significant hx of diabetes, chronic kidney disease, malignant neoplasm of the prostate, hypertension, presenting to the ED with concerns of abnormal labs.  Patient states he was seen by his PCP at the [place] several days for a 40-month checkup.  Blood work was done.  He was contacted today notifying that his kidney function is abnormal and he needs to come to the ER for further evaluation and management.  Patient overall without any acute complaint.  His wife did report patient seems to have exertional fatigue for the past 3 to 4 months.  Does endorse some chest discomfort with exertion.  Denies any significant cardiac history.  No recent chest pain.  No recent change in manage of medication.  States that he has been eating and drinking fine.  Does not endorse any back pain abdominal pain or difficulty with urination.  On exam this is a well-appearing elderly male laying in bed appears to be in no acute discomfort.  Heart with normal rate and rhythm, lungs are clear to auscultation abdomen is soft nontender normal skin turgor no significant signs  of dehydration.  Vital signs notable for mild bradycardia with heart rate of 57.  Patient is afebrile no hypoxia.  Normal blood pressure.  -Labs ordered, independently viewed and interpreted by me.  Labs remarkable for Cr. 2.08.  it was 1.24 approximately 2 yrs ago.  -The patient was maintained on a cardiac monitor.  I personally viewed and interpreted the cardiac monitored which showed an underlying rhythm of: sinus brady -Imaging including kidney US considered but not performed as his CKD is not far from baseline -This patient presents to the ED for concern of abnormal labs, this involves an extensive number of treatment options, and is a complaint that carries with it a high risk of complications and morbidity.  The differential diagnosis includes labs error, aki, ckd, drug induced, dehydration -Co morbidities that complicate the patient evaluation includes CVA, OSA, kidney stone, anemia, prostate CA -Treatment includes IVF -Reevaluation of the patient after these medicines showed that the patient improved -PCP office notes or outside notes reviewed -Discussion with specialist attending DR. Plunkett -Escalation to admission/observation considered: patients feels much better, is comfortable with discharge, and will follow up with PCP -Prescription medication considered, patient comfortable with home med -Social Determinant of Health considered  9:47 PM EMR review, although his creatinine is abnormal it is not far from his baseline from prior value.  Encourage patient to follow-up with PCP for kidney recheck and possibly get readjustments of some of his medication.  Otherwise patient is stable for discharge.  Patient voiced understanding and agrees with plan.         Final Clinical Impression(s) / ED Diagnoses Final diagnoses:  Chronic kidney disease, unspecified CKD stage    Rx / DC Orders ED Discharge Orders     None  Fayrene Helper, PA-C 05/17/23 2149    Gwyneth Sprout, MD 05/17/23 [phone]

## 2023-05-17 NOTE — Discharge Instructions (Signed)
Although your kidney function is abnormal, it is not significantly different from your previous kidney function value.  Please follow-up with your doctor for recheck next week as some medications may need to be readjusted.

## 2023-05-17 NOTE — ED Triage Notes (Signed)
Pt arrived via POV from home, stated [facility] and [facility] functions came back abnormal and he should seek medical attention. Pt denies n/d/v and fever. Pt stated he has had increased weakness and dizziness. Pt denies chest pain.

## 2023-11-25 NOTE — Progress Notes (Signed)
 Cardiology Office Note:  .   Date:  11/28/2023  ID:  [name], DOB [birth year], MRN [id number] PCP: [facility]   HeartCare Providers Cardiologist:  None { History of Present Illness: .   [name] is a 78 y.o. male with history of CAD, DM, HTN, HLD who presents for the evaluation of CAD/abnormal nuclear stress test at the request of [facility].  History of Present Illness   [name], a 78 year old male with a history of CAD, diabetes, hypertension, and CKD IIIb, presents for evaluation of CAD following an abnormal nuclear stress test conducted in August of the previous year. The test showed apical lateral and inferolateral ischemia. The patient reports profound shortness of breath with any activity, which has been worsening over time. He describes the shortness of breath as so severe that even walking to the fridge leaves him winded. He also reports that his legs swell if he does not keep them propped up, particularly during trips. He has been managing this with furosemide as needed. Symptoms ongoing for years but worsened over the past 6 months. No CP or tightness reported.    No prior history of MI. Reports TIA. Diabetes but reports A1c controlled. Most recent eGFR 32. In addition to his cardiac and renal issues, the patient has a complex urological history. He has a history of prostate cancer, which was complicated by a prostate abscess. He currently has a prostate drain. He reports that his urological issues are stable at present.  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 He is married and has two sons. He does not smoke or drink alcohol.       +abnormal nuclear stress test     Problem List CAD -07/03/2023 ([facility]) apical lateral/inferolateral ischemia  HTN HLD DM Prostate CA -c/b peri-rectal abscess  6. CKD IIIb 7. RBBB    ROS: All other ROS reviewed and negative. Pertinent positives noted in the HPI.      Studies Reviewed: .       NM Stress 07/03/2023 Moderate apical lateral/inferolateral ischemia  LVEF 53% Moderate-risk  Cr 2.14 eGFR 31  HGB 10.7   EKG 05/17/2023: NSR with RBBB, no acute changes Physical Exam:   VS:  BP 120/62 (BP Location: Left Arm, Patient Position: Sitting, Cuff Size: Normal)   Pulse 76   Ht 5' 10 (1.778 m)   Wt 226 lb (102.5 kg)   BMI 32.43 kg/m    Wt Readings from Last 3 Encounters:  11/28/23 226 lb (102.5 kg)  05/17/23 225 lb (102.1 kg)  04/01/21 229 lb 0.9 oz (103.9 kg)    GEN: Well nourished, well developed in no acute distress NECK: No JVD; No carotid bruits CARDIAC: RRR, no murmurs, rubs, gallops RESPIRATORY:  Clear to auscultation without rales, wheezing or rhonchi  ABDOMEN: Soft, non-tender, non-distended EXTREMITIES:  No edema; No deformity  ASSESSMENT AND PLAN: .   Assessment and Plan    Coronary Artery Disease (CAD) Abnormal nuclear stress test  Shortness of breath -EKG 04/2023 with RBBB and no significant ischemic changes.   -NM Stress test at the [facility] in August 2024 with apical lateral and inferolateral ischemia.  -He tells me echo was normal but I do not have access to this.  -Profound shortness of breath with any activity, possibly representing unstable angina. No chest pain reported. - Proceed with left and right heart catheterization on December 19, 2023. - Follow-up appointment 2 weeks  post-catheterization. - pre-procedure labs ordered, including BMP, CBC, and BNP. We discussed procedure would be delayed if kidney function has declined.   Chronic Kidney Disease (CKD) Stage 3B Most recent eGFR was 32 in July 2024. Discussed potential risk to kidney function from heart catheterization procedure. - Check CBC, BMP, and BNP prior to catheterization to assess current kidney function.  Prostate Cancer with Complications History of prostate cancer treated with radiation seeds, complicated by abscess and ongoing drain. Stable at  present. - Continue current management as directed by urologist.  Diabetes Mellitus Patient reports controlled diabetes with a recent A1c of around 7. - Continue current management.  Hypertension Managed with medication. - Continue current management.  Hyperlipidemia Managed with atorvastatin . - Continue atorvastatin .          Informed Consent   Shared Decision Making/Informed Consent The risks [stroke (1 in 1000), death (1 in 1000), kidney failure [usually temporary] (1 in 500), bleeding (1 in 200), allergic reaction [possibly serious] (1 in 200)], benefits (diagnostic support and management of coronary artery disease) and alternatives of a cardiac catheterization were discussed in detail with [name] and he is willing to proceed.      Follow-up: Return in about 6 weeks (around 01/09/2024).   Signed, Darryle DASEN. Barbaraann, MD, [facility]  [address], [address] [place], [place] [address] [phone]  6:01 PM

## 2023-11-28 ENCOUNTER — Encounter: Payer: Self-pay | Admitting: Cardiovascular Disease

## 2023-11-28 ENCOUNTER — Ambulatory Visit
Payer: No Typology Code available for payment source | Attending: Cardiovascular Disease | Admitting: Cardiovascular Disease

## 2023-11-28 VITALS — BP 120/62 | HR 76 | Ht 70.0 in | Wt 226.0 lb

## 2023-11-28 DIAGNOSIS — R9439 Abnormal result of other cardiovascular function study: Secondary | ICD-10-CM

## 2023-11-28 DIAGNOSIS — I25118 Atherosclerotic heart disease of native coronary artery with other forms of angina pectoris: Secondary | ICD-10-CM | POA: Diagnosis not present

## 2023-11-28 DIAGNOSIS — R0602 Shortness of breath: Secondary | ICD-10-CM | POA: Diagnosis not present

## 2023-11-28 NOTE — Patient Instructions (Addendum)
 Medication Instructions:  Your physician recommends that you continue on your current medications as directed. Please refer to the Current Medication list given to you today.    *If you need a refill on your cardiac medications before your next appointment, please call your pharmacy*   Lab Work: CBC BMP Brain Natriuretic peptide    If you have labs (blood work) drawn today and your tests are completely normal, you will receive your results only by: MyChart Message (if you have MyChart) OR A paper copy in the mail If you have any lab test that is abnormal or we need to change your treatment, we will call you to review the results.   Testing/Procedures:  [place] [facility] [place] [address] [place] [address] Dept: [phone] Loc: [phone]  [name]  11/28/2023  You are scheduled for a Cardiac Catheterization on Thursday, January 30 with Dr. Lurena Red.  1. Please arrive at the [facility] (Main Entrance A) at [facility]: [address] [place], [place] [address] at 7:00 AM (This time is 2 hour(s) before your procedure to ensure your preparation).   Free valet parking service is available. You will check in at ADMITTING. The support person will be asked to wait in the waiting room.  It is OK to have someone drop you off and come back when you are ready to be discharged.    Special note: Every effort is made to have your procedure done on time. Please understand that emergencies sometimes delay scheduled procedures.  2. Diet: Do not eat solid foods after midnight.  The patient may have clear liquids until 5am upon the day of the procedure.  3. Labs: You will need to have blood drawn on Thursday, January 9 at [facility] [address], [place]  Open: 8am - 5pm (Lunch 12:30 - 1:30)   Phone: [phone]. You do not need to be fasting.  4.  Medication instructions in preparation for your procedure:   Contrast Allergy: No    Take only 17 units of insulin  the night before your procedure. Do not take any insulin  on the day of the procedure.   On the morning of your procedure, take your Aspirin  81 mg and any morning medicines NOT listed above.  You may use sips of water .  5. Plan to go home the same day, you will only stay overnight if medically necessary. 6. Bring a current list of your medications and current insurance cards. 7. You MUST have a responsible person to drive you home. 8. Someone MUST be with you the first 24 hours after you arrive home or your discharge will be delayed. 9. Please wear clothes that are easy to get on and off and wear slip-on shoes.  Thank you for allowing us  to care for you!   -- [facility]    Follow-Up: At [facility], you and your health needs are our priority.  As part of our continuing mission to provide you with exceptional heart care, we have created designated Provider Care Teams.  These Care Teams include your primary Cardiologist (physician) and Advanced Practice Providers (APPs -  Physician Assistants and Nurse Practitioners) who all work together to provide you with the care you need, when you need it.  We recommend signing up for the patient portal called MyChart.  Sign up information is provided on this After Visit  Summary.  MyChart is used to connect with patients for Virtual Visits (Telemedicine).  Patients are able to view lab/test results, encounter notes, upcoming appointments, etc.  Non-urgent messages can be sent to your provider as well.   To learn more about what you can do with MyChart, go to [email or web address].    Your next appointment:   2 week(s)  The format for your next appointment:   In Person  Provider:   None    Other Instructions

## 2023-11-29 ENCOUNTER — Telehealth: Payer: Self-pay | Admitting: Cardiovascular Disease

## 2023-11-29 LAB — CBC
Hematocrit: 35.9 % — ABNORMAL LOW (ref 37.5–51.0)
Hemoglobin: 11.9 g/dL — ABNORMAL LOW (ref 13.0–17.7)
MCH: 27.9 pg (ref 26.6–33.0)
MCHC: 33.1 g/dL (ref 31.5–35.7)
MCV: 84 fL (ref 79–97)
Platelets: 232 10*3/uL (ref 150–450)
RBC: 4.26 x10E6/uL (ref 4.14–5.80)
RDW: 13.7 % (ref 11.6–15.4)
WBC: 9 10*3/uL (ref 3.4–10.8)

## 2023-11-29 LAB — BASIC METABOLIC PANEL
BUN/Creatinine Ratio: 11 (ref 10–24)
BUN: 18 mg/dL (ref 8–27)
CO2: 18 mmol/L — ABNORMAL LOW (ref 20–29)
Calcium: 9.4 mg/dL (ref 8.6–10.2)
Chloride: 112 mmol/L — ABNORMAL HIGH (ref 96–106)
Creatinine, Ser: 1.64 mg/dL — ABNORMAL HIGH (ref 0.76–1.27)
Glucose: 136 mg/dL — ABNORMAL HIGH (ref 70–99)
Potassium: 4.6 mmol/L (ref 3.5–5.2)
Sodium: 149 mmol/L — ABNORMAL HIGH (ref 134–144)
eGFR: 43 mL/min/{1.73_m2} — ABNORMAL LOW (ref >59)

## 2023-11-29 LAB — BRAIN NATRIURETIC PEPTIDE: BNP: 39.4 pg/mL (ref 0.0–100.0)

## 2023-11-29 NOTE — Telephone Encounter (Signed)
 Patient's wife states they were advised that Dr. Barbaraann forgot to do an EK while patient was in office and he needs to come back to hae it done. Patient's wife would like to know when this can be arranged. She states they will be out of town next week and she would like a call back this afternoon if at all possible.

## 2023-12-03 NOTE — Telephone Encounter (Signed)
 Patient identification verified by 2 forms. Shirl Ellen, RN     Called patient and wife but did not get an answer. LVTCB.  2nd attempt to get patient scheduled for pre-cath EKG. Previous note shows patient will be out of town this week. Will f/u again on Friday 1/17.

## 2023-12-09 ENCOUNTER — Telehealth: Payer: Self-pay

## 2023-12-09 NOTE — Telephone Encounter (Addendum)
Patient identification verified by 2 forms. Shade Flood, RN     Called and spoke to patient's wife. Scheduled pt for nurse visit on 1/22 to get pre-cath EKG done.  Rescheduled pt for 2w f/u appt on 2/6 at 11am so that he is seen post-cath as provider requested .   Spouse will inform mr Hacker. No further questions at this time.    UPDATE: patient wife called and cancelled nurse visit due to patient being sick. She will call and reschedule cath and nurse visit.

## 2023-12-09 NOTE — Telephone Encounter (Signed)
-----   Message from Lenna Gilford [place] sent at 11/28/2023  6:01 PM EST ----- Regarding: Pre cath EKG Ta:  We need him to come back for a pre procedure EKG. Cath is 1/30 so we have time.   Thanks!  -[employer]

## 2023-12-11 ENCOUNTER — Ambulatory Visit: Payer: No Typology Code available for payment source | Attending: Cardiology

## 2023-12-16 ENCOUNTER — Telehealth: Payer: Self-pay | Admitting: Cardiovascular Disease

## 2023-12-16 NOTE — Telephone Encounter (Signed)
Spoke with Tacey Ruiz. She reports his blood sugars are uncontrolled (>300). She said he wants to cancel heart catheterization.   Patient has an appointment with endocrinology in about 2 weeks.   Advised will send to MD/RN for review FIRST, before cancelling procedure

## 2023-12-16 NOTE — Telephone Encounter (Signed)
Pt spouse called in stating pt is having troubles with blood sugar right now and needs to cancel heart cath

## 2023-12-17 ENCOUNTER — Ambulatory Visit: Payer: No Typology Code available for payment source | Admitting: Cardiovascular Disease

## 2023-12-17 NOTE — Telephone Encounter (Signed)
Left message to call back - left heart cath has been cancelled w/cath lab

## 2023-12-17 NOTE — Telephone Encounter (Signed)
Sande Rives, MD  Lindell Spar, RN Cc: Jonah Blue, RN Caller: Unspecified (Yesterday,  4:15 PM) OK. Have him reschedule to see me when he is ready for the cath.  Gerri Spore T. Flora Lipps, MD, [facility]  [facility] [address], [address] [place], [place] [address] [phone] 6:16 PM

## 2023-12-19 ENCOUNTER — Ambulatory Visit (HOSPITAL_COMMUNITY): Admit: 2023-12-19 | Payer: No Typology Code available for payment source | Admitting: Internal Medicine

## 2023-12-19 ENCOUNTER — Encounter (HOSPITAL_COMMUNITY): Payer: Self-pay

## 2023-12-19 SURGERY — RIGHT/LEFT HEART CATH AND CORONARY ANGIOGRAPHY
Anesthesia: LOCAL

## 2023-12-26 ENCOUNTER — Ambulatory Visit: Payer: No Typology Code available for payment source | Admitting: Cardiovascular Disease

## 2024-04-23 ENCOUNTER — Emergency Department (HOSPITAL_COMMUNITY)

## 2024-04-23 ENCOUNTER — Inpatient Hospital Stay (HOSPITAL_COMMUNITY)
Admission: EM | Admit: 2024-04-23 | Discharge: 2024-04-27 | DRG: 684 | Disposition: A | Discharge status: Home Health | Attending: Family Medicine | Admitting: Family Medicine

## 2024-04-23 ENCOUNTER — Encounter (HOSPITAL_COMMUNITY): Payer: Self-pay | Admitting: Emergency Medicine

## 2024-04-23 ENCOUNTER — Other Ambulatory Visit: Payer: Self-pay

## 2024-04-23 DIAGNOSIS — Z794 Long term (current) use of insulin: Secondary | ICD-10-CM | POA: Diagnosis not present

## 2024-04-23 DIAGNOSIS — I69328 Other speech and language deficits following cerebral infarction: Secondary | ICD-10-CM | POA: Diagnosis not present

## 2024-04-23 DIAGNOSIS — D631 Anemia in chronic kidney disease: Secondary | ICD-10-CM | POA: Diagnosis present

## 2024-04-23 DIAGNOSIS — E1142 Type 2 diabetes mellitus with diabetic polyneuropathy: Secondary | ICD-10-CM | POA: Diagnosis present

## 2024-04-23 DIAGNOSIS — M1A9XX0 Chronic gout, unspecified, without tophus (tophi): Secondary | ICD-10-CM | POA: Diagnosis present

## 2024-04-23 DIAGNOSIS — Z6832 Body mass index (BMI) 32.0-32.9, adult: Secondary | ICD-10-CM | POA: Diagnosis not present

## 2024-04-23 DIAGNOSIS — Z8249 Family history of ischemic heart disease and other diseases of the circulatory system: Secondary | ICD-10-CM

## 2024-04-23 DIAGNOSIS — Z8546 Personal history of malignant neoplasm of prostate: Secondary | ICD-10-CM

## 2024-04-23 DIAGNOSIS — N400 Enlarged prostate without lower urinary tract symptoms: Secondary | ICD-10-CM | POA: Diagnosis present

## 2024-04-23 DIAGNOSIS — E860 Dehydration: Secondary | ICD-10-CM | POA: Diagnosis present

## 2024-04-23 DIAGNOSIS — N1831 Chronic kidney disease, stage 3a: Secondary | ICD-10-CM | POA: Diagnosis present

## 2024-04-23 DIAGNOSIS — I129 Hypertensive chronic kidney disease with stage 1 through stage 4 chronic kidney disease, or unspecified chronic kidney disease: Secondary | ICD-10-CM | POA: Diagnosis present

## 2024-04-23 DIAGNOSIS — Z77028 Contact with and (suspected) exposure to other hazardous aromatic compounds: Secondary | ICD-10-CM | POA: Diagnosis present

## 2024-04-23 DIAGNOSIS — E11649 Type 2 diabetes mellitus with hypoglycemia without coma: Secondary | ICD-10-CM

## 2024-04-23 DIAGNOSIS — E669 Obesity, unspecified: Secondary | ICD-10-CM | POA: Diagnosis present

## 2024-04-23 DIAGNOSIS — R55 Syncope and collapse: Secondary | ICD-10-CM | POA: Diagnosis present

## 2024-04-23 DIAGNOSIS — E785 Hyperlipidemia, unspecified: Secondary | ICD-10-CM | POA: Diagnosis present

## 2024-04-23 DIAGNOSIS — Z7982 Long term (current) use of aspirin: Secondary | ICD-10-CM

## 2024-04-23 DIAGNOSIS — Z96651 Presence of right artificial knee joint: Secondary | ICD-10-CM | POA: Diagnosis present

## 2024-04-23 DIAGNOSIS — E876 Hypokalemia: Secondary | ICD-10-CM | POA: Diagnosis present

## 2024-04-23 DIAGNOSIS — R9431 Abnormal electrocardiogram [ECG] [EKG]: Secondary | ICD-10-CM | POA: Diagnosis present

## 2024-04-23 DIAGNOSIS — S300XXA Contusion of lower back and pelvis, initial encounter: Secondary | ICD-10-CM | POA: Diagnosis present

## 2024-04-23 DIAGNOSIS — Z8673 Personal history of transient ischemic attack (TIA), and cerebral infarction without residual deficits: Secondary | ICD-10-CM

## 2024-04-23 DIAGNOSIS — I10 Essential (primary) hypertension: Secondary | ICD-10-CM | POA: Diagnosis not present

## 2024-04-23 DIAGNOSIS — W1839XA Other fall on same level, initial encounter: Secondary | ICD-10-CM | POA: Diagnosis present

## 2024-04-23 DIAGNOSIS — G4733 Obstructive sleep apnea (adult) (pediatric): Secondary | ICD-10-CM | POA: Diagnosis present

## 2024-04-23 DIAGNOSIS — D649 Anemia, unspecified: Secondary | ICD-10-CM | POA: Diagnosis present

## 2024-04-23 DIAGNOSIS — I2510 Atherosclerotic heart disease of native coronary artery without angina pectoris: Secondary | ICD-10-CM | POA: Diagnosis present

## 2024-04-23 DIAGNOSIS — Z85828 Personal history of other malignant neoplasm of skin: Secondary | ICD-10-CM

## 2024-04-23 DIAGNOSIS — M25562 Pain in left knee: Secondary | ICD-10-CM | POA: Diagnosis present

## 2024-04-23 DIAGNOSIS — E1122 Type 2 diabetes mellitus with diabetic chronic kidney disease: Secondary | ICD-10-CM | POA: Diagnosis present

## 2024-04-23 DIAGNOSIS — N189 Chronic kidney disease, unspecified: Secondary | ICD-10-CM | POA: Diagnosis present

## 2024-04-23 DIAGNOSIS — Z96641 Presence of right artificial hip joint: Secondary | ICD-10-CM | POA: Diagnosis present

## 2024-04-23 DIAGNOSIS — Z86718 Personal history of other venous thrombosis and embolism: Secondary | ICD-10-CM

## 2024-04-23 DIAGNOSIS — N179 Acute kidney failure, unspecified: Principal | ICD-10-CM | POA: Diagnosis present

## 2024-04-23 DIAGNOSIS — Z79899 Other long term (current) drug therapy: Secondary | ICD-10-CM

## 2024-04-23 DIAGNOSIS — Z923 Personal history of irradiation: Secondary | ICD-10-CM

## 2024-04-23 DIAGNOSIS — E119 Type 2 diabetes mellitus without complications: Secondary | ICD-10-CM

## 2024-04-23 LAB — COMPREHENSIVE METABOLIC PANEL WITH GFR
ALT: 14 U/L (ref 0–44)
AST: 21 U/L (ref 15–41)
Albumin: 3.3 g/dL — ABNORMAL LOW (ref 3.5–5.0)
Alkaline Phosphatase: 111 U/L (ref 38–126)
Anion gap: 11 (ref 5–15)
BUN: 47 mg/dL — ABNORMAL HIGH (ref 8–23)
CO2: 21 mmol/L — ABNORMAL LOW (ref 22–32)
Calcium: 8.5 mg/dL — ABNORMAL LOW (ref 8.9–10.3)
Chloride: 107 mmol/L (ref 98–111)
Creatinine, Ser: 3.73 mg/dL — ABNORMAL HIGH (ref 0.61–1.24)
GFR, Estimated: 16 mL/min — ABNORMAL LOW (ref >60)
Glucose, Bld: 162 mg/dL — ABNORMAL HIGH (ref 70–99)
Potassium: 3.1 mmol/L — ABNORMAL LOW (ref 3.5–5.1)
Sodium: 139 mmol/L (ref 135–145)
Total Bilirubin: 0.7 mg/dL (ref 0.0–1.2)
Total Protein: 6.5 g/dL (ref 6.5–8.1)

## 2024-04-23 LAB — URINALYSIS, ROUTINE W REFLEX MICROSCOPIC
Bilirubin Urine: NEGATIVE
Glucose, UA: NEGATIVE mg/dL
Hgb urine dipstick: NEGATIVE
Ketones, ur: NEGATIVE mg/dL
Leukocytes,Ua: NEGATIVE
Nitrite: NEGATIVE
Protein, ur: NEGATIVE mg/dL
Specific Gravity, Urine: 1.024 (ref 1.005–1.030)
pH: 5 (ref 5.0–8.0)

## 2024-04-23 LAB — CBC WITH DIFFERENTIAL/PLATELET
Abs Immature Granulocytes: 0.07 10*3/uL (ref 0.00–0.07)
Basophils Absolute: 0 10*3/uL (ref 0.0–0.1)
Basophils Relative: 0 %
Eosinophils Absolute: 0.3 10*3/uL (ref 0.0–0.5)
Eosinophils Relative: 3 %
HCT: 31.2 % — ABNORMAL LOW (ref 39.0–52.0)
Hemoglobin: 9.9 g/dL — ABNORMAL LOW (ref 13.0–17.0)
Immature Granulocytes: 1 %
Lymphocytes Relative: 17 %
Lymphs Abs: 1.8 10*3/uL (ref 0.7–4.0)
MCH: 28 pg (ref 26.0–34.0)
MCHC: 31.7 g/dL (ref 30.0–36.0)
MCV: 88.4 fL (ref 80.0–100.0)
Monocytes Absolute: 0.7 10*3/uL (ref 0.1–1.0)
Monocytes Relative: 7 %
Neutro Abs: 7.3 10*3/uL (ref 1.7–7.7)
Neutrophils Relative %: 72 %
Platelets: 153 10*3/uL (ref 150–400)
RBC: 3.53 MIL/uL — ABNORMAL LOW (ref 4.22–5.81)
RDW: 13.2 % (ref 11.5–15.5)
WBC: 10.2 10*3/uL (ref 4.0–10.5)
nRBC: 0 % (ref 0.0–0.2)

## 2024-04-23 LAB — OSMOLALITY, URINE: Osmolality, Ur: 439 mosm/kg (ref 300–900)

## 2024-04-23 LAB — SODIUM, URINE, RANDOM: Sodium, Ur: 25 mmol/L

## 2024-04-23 LAB — CK: Total CK: 522 U/L — ABNORMAL HIGH (ref 49–397)

## 2024-04-23 LAB — TROPONIN I (HIGH SENSITIVITY)
Troponin I (High Sensitivity): 12 ng/L (ref <18)
Troponin I (High Sensitivity): 12 ng/L (ref <18)
Troponin I (High Sensitivity): 12 ng/L (ref <18)

## 2024-04-23 LAB — CREATININE, URINE, RANDOM: Creatinine, Urine: 362 mg/dL

## 2024-04-23 LAB — PHOSPHORUS: Phosphorus: 3.3 mg/dL (ref 2.5–4.6)

## 2024-04-23 LAB — LACTIC ACID, PLASMA: Lactic Acid, Venous: 2.4 mmol/L (ref 0.5–1.9)

## 2024-04-23 LAB — MAGNESIUM: Magnesium: 1.7 mg/dL (ref 1.7–2.4)

## 2024-04-23 LAB — CBG MONITORING, ED: Glucose-Capillary: 117 mg/dL — ABNORMAL HIGH (ref 70–99)

## 2024-04-23 MED ORDER — AMITRIPTYLINE HCL 25 MG PO TABS
50.0000 mg | ORAL_TABLET | Freq: Every day | ORAL | Status: DC
  Administered 2024-04-24 – 2024-04-26 (×4): 50 mg via ORAL
  Filled 2024-04-23 (×4): qty 2

## 2024-04-23 MED ORDER — ACETAMINOPHEN 650 MG RE SUPP: 650.0000 mg | Freq: Four times a day (QID) | RECTAL | Status: DC | PRN

## 2024-04-23 MED ORDER — TAMSULOSIN HCL 0.4 MG PO CAPS
0.4000 mg | ORAL_CAPSULE | Freq: Every day | ORAL | Status: DC
  Administered 2024-04-24 – 2024-04-26 (×3): 0.4 mg via ORAL
  Filled 2024-04-23 (×3): qty 1

## 2024-04-23 MED ORDER — ACETAMINOPHEN 325 MG PO TABS: 650.0000 mg | ORAL_TABLET | Freq: Four times a day (QID) | ORAL | Status: DC | PRN

## 2024-04-23 MED ORDER — GABAPENTIN 300 MG PO CAPS
600.0000 mg | ORAL_CAPSULE | Freq: Every day | ORAL | Status: DC
  Administered 2024-04-24 – 2024-04-26 (×4): 600 mg via ORAL
  Filled 2024-04-23 (×4): qty 2

## 2024-04-23 MED ORDER — HYDROCODONE-ACETAMINOPHEN 5-325 MG PO TABS
1.0000 | ORAL_TABLET | ORAL | Status: DC | PRN
  Administered 2024-04-23: 1 via ORAL
  Filled 2024-04-23: qty 1

## 2024-04-23 MED ORDER — INSULIN ASPART 100 UNIT/ML IJ SOLN
0.0000 [IU] | INTRAMUSCULAR | Status: DC
  Administered 2024-04-24 – 2024-04-26 (×5): 1 [IU] via SUBCUTANEOUS
  Administered 2024-04-26: 2 [IU] via SUBCUTANEOUS
  Filled 2024-04-23: qty 0.09

## 2024-04-23 MED ORDER — INSULIN GLARGINE-YFGN 100 UNIT/ML ~~LOC~~ SOLN
10.0000 [IU] | Freq: Every day | SUBCUTANEOUS | Status: DC
  Administered 2024-04-24 – 2024-04-26 (×4): 10 [IU] via SUBCUTANEOUS
  Filled 2024-04-23 (×5): qty 0.1

## 2024-04-23 MED ORDER — LACTATED RINGERS IV SOLN: INTRAVENOUS | Status: DC

## 2024-04-23 MED ORDER — ONDANSETRON HCL 4 MG/2ML IJ SOLN
4.0000 mg | Freq: Once | INTRAMUSCULAR | Status: AC
  Administered 2024-04-23: 4 mg via INTRAVENOUS
  Filled 2024-04-23: qty 2

## 2024-04-23 MED ORDER — BUPROPION HCL ER (XL) 150 MG PO TB24
150.0000 mg | ORAL_TABLET | Freq: Every day | ORAL | Status: DC
  Administered 2024-04-24 – 2024-04-27 (×4): 150 mg via ORAL
  Filled 2024-04-23 (×4): qty 1

## 2024-04-23 MED ORDER — LACTATED RINGERS IV BOLUS
1000.0000 mL | Freq: Once | INTRAVENOUS | Status: AC
  Administered 2024-04-23: 1000 mL via INTRAVENOUS

## 2024-04-23 MED ORDER — SODIUM CHLORIDE 0.9 % IV SOLN: INTRAVENOUS | Status: DC

## 2024-04-23 MED ORDER — MORPHINE SULFATE (PF) 4 MG/ML IV SOLN
4.0000 mg | Freq: Once | INTRAVENOUS | Status: AC
  Administered 2024-04-23: 4 mg via INTRAVENOUS
  Filled 2024-04-23: qty 1

## 2024-04-23 MED ORDER — POTASSIUM CHLORIDE CRYS ER 20 MEQ PO TBCR
20.0000 meq | EXTENDED_RELEASE_TABLET | Freq: Once | ORAL | Status: AC
  Administered 2024-04-23: 20 meq via ORAL
  Filled 2024-04-23: qty 1

## 2024-04-23 MED ORDER — ATORVASTATIN CALCIUM 10 MG PO TABS: 20.0000 mg | ORAL_TABLET | Freq: Every day | ORAL | Status: DC

## 2024-04-23 NOTE — Assessment & Plan Note (Signed)
Allow permissive htn for tonight 

## 2024-04-23 NOTE — ED Triage Notes (Signed)
 Pt BIBA from home for fall yesterday and again today. Felt dizzy before fall. Hx agent orange and associated joint pain. Pain to left knee, increased weakness last few days, has also had some med changes recently. NV after movement. Possibly decreased UP, hx of prostate cx. 12L unremarkable. 4mg  zofran  22 ga lh  110/86 92 HR Cbg 184 98% RA

## 2024-04-23 NOTE — Assessment & Plan Note (Signed)
 Conitnue lipitor 20 mg po q day

## 2024-04-23 NOTE — ED Provider Notes (Signed)
 [place] [facility] AT [facility] Provider Note  CSN: [id number] Arrival date & time: 04/23/24 1417  Chief Complaint(s) Fall and Weakness  HPI [name] is a 78 y.o. male with PMH diabetes, agent orange exposure, CKD, prostate cancer with radioactive bead placement and multiple associated surgical complications with chronic drain in place who presents emergency room for evaluation of a fall.  Patient states that he was using the restroom last night, suffered a syncopal episode, fell and has been on the ground for multiple hours.  Endorsing left knee pain, bilateral rib pain, abdominal pain and back pain.  He denies head strike or loss of consciousness.  No blood thinner use.   Past Medical History Past Medical History:  Diagnosis Date   Abrasion of face    11-15-2020  per pt stated has small area under one ear,  no drainage , no dressing, putting neosporin on it   Anemia associated with chronic renal failure    Anxiety    Arthritis    Chronic constipation    Chronic gout    per pt last episode approx. 2005  ([facility] )   Chronic pruritic rash in adult    intermittant on back   CKD (chronic kidney disease), stage III (HCC)    followed [facility]rologist @ [facility]   Coronary artery disease    Diverticulosis of colon    DOE (dyspnea on exertion)    long distance walking and up flight of stairs but states ok after rest couple minutes   Dyspnea    History of agent Orange exposure    History of basal cell carcinoma excision    back, stomach, face   History of CVA (cerebrovascular accident) without residual deficits 03-12-2014 in epic   acute/ subacute thalamic infarct, thrombotic secondary to severe cerebral vascular disease   History of DVT of lower extremity    History of kidney stones    Hyperlipidemia    Hyperplasia of prostate with lower urinary tract symptoms (LUTS)    Hypertension    followed by [facility]cp [facility]    OSA on CPAP    severe per study in  epic 08-31-2015,  states uses nightly   Peripheral neuropathy    Prostate cancer [facility]) urologist--- dr Secundino Dach;  oncologist--- dr Lorri Rota   first dx 02/ 2014 active survillance;  repeat bx 08-29-2020 ,  Stage T1c, Gleason 3+4   Stroke [facility])    Type 2 diabetes mellitus treated with insulin  (HCC)    follow[facility]   (11-15-2020  per pt check blood sugar twice daily,  fasting sugar --- 110--120)   Wears glasses    Wears hearing aid in both ears    HOH even when he wears aids   Patient Active Problem List   Diagnosis Date Noted   Dehydration 04/23/2024   History of stroke 04/23/2024   Hypokalemia 04/23/2024   Prolonged QT interval 04/23/2024   OSA (obstructive sleep apnea) 04/23/2024   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 04/04/2021   Renal failure (ARF), acute on chronic (HCC) 04/03/2021   Perirectal abscess    Sepsis with acute renal failure without septic shock (HCC)    Fournier's gangrene 03/31/2021   Cellulitis of perineum 03/31/2021   AKI (acute kidney injury) (HCC) 03/31/2021   Malignant neoplasm of prostate (HCC) 10/04/2020   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 Authorizing Provider  acetaminophen  (TYLENOL ) 500 MG tablet Take 1,000 mg by mouth every 6 (six) hours as needed for moderate pain.   Yes [provider]  amitriptyline  (ELAVIL ) 50 MG tablet Take 50 mg by mouth at bedtime.  10/06/11  Yes [provider]  amLODipine  (NORVASC ) 10 MG tablet Take 10 mg by mouth at bedtime.   Yes [provider]  aspirin   EC 81 MG tablet Take 162 mg by mouth daily. Swallow whole.   Yes [provider]  atorvastatin  (LIPITOR) 20 MG tablet Take 20 mg by mouth at bedtime.   Yes [provider]  buPROPion  (WELLBUTRIN  XL) 150 MG 24 hr tablet Take 150 mg by mouth daily.   Yes [provider]  camphor-menthol  (SARNA) lotion Apply 1 application topically daily. 09/05/20  Yes [provider]  Carboxymethylcellulose Sod PF 0.5 % SOLN Place 1 drop into both eyes in the morning, at noon, in the evening, and at bedtime.   Yes [provider]  carvedilol  (COREG ) 12.5 MG tablet Take 12.5 mg by mouth 2 (two) times daily with a meal.   Yes [provider]  colchicine  0.6 MG tablet Take 0.6 mg by mouth daily as needed (gout).   Yes [provider]  febuxostat  (ULORIC ) 40 MG tablet Take 40 mg by mouth at bedtime.   Yes [provider]  ferrous sulfate  (FERROUSUL) 325 (65 FE) MG tablet Take 1 tablet (325 mg total) by mouth 3 (three) times daily with meals. Patient taking differently: Take 325 mg by mouth daily. 01/22/17  Yes Babish, Zoila Hines, PA-C  furosemide (LASIX) 20 MG tablet Take 20 mg by mouth daily.   Yes [provider]  gabapentin  (NEURONTIN ) 300 MG capsule Take 300-600 mg by mouth 2 (two) times daily. Take 2 capsules (600 mg) in the morning and Take 1 capsule (300 mg) at bedtime   Yes [provider]  hydrocortisone  (ANUSOL -HC) 25 MG suppository Place 1 suppository (25 mg total) rectally 2 (two) times daily. Patient taking differently: Place 25 mg rectally 2 (two) times daily as needed for hemorrhoids. 12/15/20  Yes Bruning, Ashlyn, PA-C  hydrocortisone  2.5 % cream Apply 1 application topically at bedtime.   Yes [provider]  insulin  glargine (LANTUS ) 100 UNIT/ML injection Inject 0.15 mLs (15 Units total) into the skin at bedtime. Patient taking differently: Inject 35 Units into the skin at bedtime. 04/11/21  Yes Bary Boss, DO   neomycin-bacitracin-polymyxin (NEOSPORIN) ointment Apply 1 application topically daily as needed for wound care.   Yes [provider]  nitroGLYCERIN (NITROSTAT) 0.4 MG SL tablet Place 0.4 mg under the tongue every 5 (five) minutes as needed for chest pain.   Yes [provider]  polycarbophil (FIBERCON) 625 MG tablet Take 1 tablet (625 mg total) by mouth daily. 04/05/21  Yes Annetta Killian, PA-C  Pyridoxine HCl (VITAMIN B-6 PO) Take 2 tablets by mouth daily.    Yes [provider]  tamsulosin  (FLOMAX ) 0.4 MG CAPS capsule Take 1 capsule (0.4 mg total) by mouth daily after supper. 12/15/20  Yes Bruning, Ashlyn, PA-C  tetrahydrozoline 0.05 % ophthalmic solution Place 3 drops into both  eyes daily as needed. 01/24/17  Yes [provider]  vitamin B-12 (CYANOCOBALAMIN ) 1000 MCG tablet Take 1,000 mcg by mouth daily.   Yes [provider]                                                                                                                                    Past Surgical History Past Surgical History:  Procedure Laterality Date   APPENDECTOMY  age 65   BLEPHAROPLASTY Bilateral 2015   upper eyelid   CYSTOSCOPY N/A 11/16/2020   Procedure: CYSTOSCOPY FLEXIBLE;  Surgeon: Osborn Blaze, MD;  Location: [facility];  Service: Urology;  Laterality: N/A;  No seeds detected in bladder per Dr. Secundino Dach   CYSTOSCOPY/RETROGRADE/URETEROSCOPY/STONE EXTRACTION WITH BASKET  2002   IRRIGATION AND DEBRIDEMENT ABSCESS N/A 04/01/2021   Procedure: EXAM  UNDER ANESTHESIA; ANOSCOPY; DRAINAGE OF PERIRECTAL ABSCESS; PLACEMENT OF SETON;  Surgeon: Caralyn Chandler, MD;  Location: [facility];  Service: General;  Laterality: N/A;   MANDIBLE RECONSTRUCTION Left 2014   w/ cadaver bone   MOHS SURGERY  2012   nose   PILONIDAL CYST EXCISION  05/2020   RADIOACTIVE SEED IMPLANT N/A 11/16/2020   Procedure: RADIOACTIVE SEED IMPLANT/BRACHYTHERAPY IMPLANT;  Surgeon: Osborn Blaze, MD;  Location: [facility];  Service: Urology;  Laterality: N/A;  90 MINS   SPACE OAR INSTILLATION N/A 11/16/2020   Procedure: SPACE OAR INSTILLATION;  Surgeon: Osborn Blaze, MD;  Location[facility];  Service: Urology;  Laterality: N/A;   TOTAL HIP ARTHROPLASTY Right 10-26-2003  @[facility]    TOTAL KNEE ARTHROPLASTY Right 01/22/2017   Procedure: RIGHT TOTAL KNEE ARTHROPLASTY;  Surgeon: Claiborne Crew, MD;  Location: [facility];  Service: Orthopedics;  Laterality: Right;   Family History Family History  Problem Relation Age of Onset   Dementia Mother    Hypertension Mother    Diabetes Mother    Heart attack Father 5   Kidney cancer Brother        tumor removed   Hypertension Brother    Liver cancer Maternal Grandfather    Testicular cancer Brother    Cancer Paternal Aunt    Colon cancer Neg Hx    Colon polyps Neg Hx    Esophageal cancer Neg Hx    Stomach cancer Neg Hx    Rectal cancer Neg Hx     Social History Social History   Tobacco Use   Smoking status: Never   Smokeless tobacco: Never  Vaping Use   Vaping status: Never Used  Substance Use Topics   Alcohol use: No   Drug use: Never   Allergies Patient has no known allergies.  Review of Systems Review of Systems  Musculoskeletal:  Positive for arthralgias and myalgias.    Physical Exam Vital Signs  I have reviewed the triage vital signs BP (!) 142/63   Pulse 74   Temp 97.8 F (36.6 C)  Resp 20   Ht 5\' 10"  (1.778 m)   Wt 102.5 kg   SpO2 100%   BMI 32.42 kg/m   Physical Exam Constitutional:      General: He is not in acute distress.    Appearance: Normal appearance.  HENT:     Head: Normocephalic and atraumatic.     Nose: No congestion or rhinorrhea.  Eyes:     General:        Right eye: No discharge.        Left eye: No discharge.     Extraocular Movements: Extraocular movements intact.     Pupils: Pupils are equal, round, and reactive to light.   Cardiovascular:     Rate and Rhythm: Normal rate and regular rhythm.     Heart sounds: No murmur heard. Pulmonary:     Effort: No respiratory distress.     Breath sounds: No wheezing or rales.  Abdominal:     General: There is no distension.     Tenderness: There is abdominal tenderness.  Musculoskeletal:        General: Tenderness present. Normal range of motion.     Cervical back: Normal range of motion.  Skin:    General: Skin is warm and dry.  Neurological:     General: No focal deficit present.     Mental Status: He is alert.     ED Results and Treatments Labs (all labs ordered are listed, but only abnormal results are displayed) Labs Reviewed  COMPREHENSIVE METABOLIC PANEL WITH GFR - Abnormal; Notable for the following components:      Result Value   Potassium 3.1 (*)    CO2 21 (*)    Glucose, Bld 162 (*)    BUN 47 (*)    Creatinine, Ser 3.73 (*)    Calcium  8.5 (*)    Albumin 3.3 (*)    GFR, Estimated 16 (*)    All other components within normal limits  CBC WITH DIFFERENTIAL/PLATELET - Abnormal; Notable for the following components:   RBC 3.53 (*)    Hemoglobin 9.9 (*)    HCT 31.2 (*)    All other components within normal limits  URINALYSIS, ROUTINE W REFLEX MICROSCOPIC - Abnormal; Notable for the following components:   APPearance HAZY (*)    All other components within normal limits  CK - Abnormal; Notable for the following components:   Total CK 522 (*)    All other components within normal limits  CBG MONITORING, ED - Abnormal; Notable for the following components:   Glucose-Capillary 117 (*)    All other components within normal limits  MAGNESIUM   PHOSPHORUS  CREATININE, URINE, RANDOM  SODIUM, URINE, RANDOM  LACTIC ACID, PLASMA  LACTIC ACID, PLASMA  OSMOLALITY, URINE  TSH  VITAMIN B12  FOLATE  IRON AND TIBC  FERRITIN  RETICULOCYTES  HEMOGLOBIN A1C  OSMOLALITY  TROPONIN I (HIGH SENSITIVITY)  TROPONIN I (HIGH SENSITIVITY)  TROPONIN I (HIGH  SENSITIVITY)  TROPONIN I (HIGH SENSITIVITY)  Radiology CT HEAD WO CONTRAST ( ) Result Date: 04/23/2024 CLINICAL DATA:  Head trauma. Fall yesterday and again today. Dizziness before fall. EXAM: CT HEAD WITHOUT CONTRAST TECHNIQUE: Contiguous axial images were obtained from the base of the skull through the vertex without intravenous contrast. RADIATION DOSE REDUCTION: This exam was performed according to the departmental dose-optimization program which includes automated exposure control, adjustment of the mA and/or kV according to patient size and/or use of iterative reconstruction technique. COMPARISON:  None Available. FINDINGS: Brain: No intracranial hemorrhage, mass effect, or midline shift. Age related atrophy. No hydrocephalus. The basilar cisterns are patent. Mild periventricular and deep white matter hypodensity typical of chronic small vessel ischemia. Remote lacunar infarct in the left caudate no evidence of territorial infarct or acute ischemia. No extra-axial or intracranial fluid collection. Vascular: Atherosclerosis of skullbase vasculature without hyperdense vessel or abnormal calcification. Skull: No fracture or focal lesion. Sinuses/Orbits: Mucosal thickening throughout ethmoid air cells. No sinus fluid levels. No mastoid effusion. Other: None IMPRESSION: 1. No acute intracranial abnormality. No skull fracture. 2. Age related atrophy and chronic small vessel ischemia. Remote lacunar infarct in the left caudate. Electronically Signed   By: Chadwick Colonel M.D.   On: 04/23/2024 18:31   CT CHEST ABDOMEN PELVIS WO CONTRAST Result Date: 04/23/2024 CLINICAL DATA:  Trauma EXAM: CT CHEST, ABDOMEN AND PELVIS WITHOUT CONTRAST TECHNIQUE: Multidetector CT imaging of the chest, abdomen and pelvis was performed following the standard protocol without IV contrast. RADIATION DOSE  REDUCTION: This exam was performed according to the departmental dose-optimization program which includes automated exposure control, adjustment of the mA and/or kV according to patient size and/or use of iterative reconstruction technique. COMPARISON:  CT abdomen and pelvis 03/31/2021. FINDINGS: CT CHEST FINDINGS Cardiovascular: No significant vascular findings. Normal heart size. No pericardial effusion. There are atherosclerotic calcifications of the aorta and coronary arteries. Mediastinum/Nodes: There is a posterior left thyroid nodule measuring 11 mm. Thyroid gland is otherwise within normal limits. No enlarged lymph nodes are seen. There is distal esophageal wall thickening and small hiatal hernia. Lungs/Pleura: There are atelectatic changes in the bilateral lower lobes. The lungs are otherwise clear. There is no pleural effusion or pneumothorax. Musculoskeletal: No acute fracture identified. CT ABDOMEN PELVIS FINDINGS Hepatobiliary: Gallstones are present. There is no biliary ductal dilatation. The liver is within normal limits. Pancreas: Unremarkable. No pancreatic ductal dilatation or surrounding inflammatory changes. Spleen: No splenic injury or perisplenic hematoma. Adrenals/Urinary Tract: No adrenal hemorrhage or renal injury identified. Bladder is unremarkable. Stomach/Bowel: Stomach is within normal limits. Appendix is not seen. No evidence of bowel wall thickening, distention, or inflammatory changes. Sigmoid colon diverticulosis is present. Left perianal/perirectal fistula catheter present. No focal fluid collections are seen. Vascular/Lymphatic: Aortic atherosclerosis. No enlarged abdominal or pelvic lymph nodes. Reproductive: Prostate radiotherapy seeds are present. Other: Small fat containing umbilical hernia present. No ascites present. There is a 1.7 x 1.7 x 1.4 cm density in the left gluteal subcutaneous tissues, new from prior. Musculoskeletal: No acute fractures are seen. Right hip  arthroplasty is present. IMPRESSION: 1. No acute posttraumatic sequelae in the chest, abdomen or pelvis. 2. Left perianal/perirectal catheter within a fistula. 3. 1.7 cm density in the left gluteal subcutaneous tissues, new from prior, possibly a hematoma. Enlarged lymph node is also in the differential. 4. Cholelithiasis. 5. Distal esophageal wall thickening and small hiatal hernia. 6. Aortic atherosclerosis. Aortic Atherosclerosis (ICD10-I70.0). Electronically Signed   By: Tyron Gallon M.D.   On: 04/23/2024 18:26   CT CERVICAL SPINE  WO CONTRAST Result Date: 04/23/2024 CLINICAL DATA:  Fall yesterday and again today. Dizziness before fall. EXAM: CT CERVICAL SPINE WITHOUT CONTRAST TECHNIQUE: Multidetector CT imaging of the cervical spine was performed without intravenous contrast. Multiplanar CT image reconstructions were also generated. RADIATION DOSE REDUCTION: This exam was performed according to the departmental dose-optimization program which includes automated exposure control, adjustment of the mA and/or kV according to patient size and/or use of iterative reconstruction technique. COMPARISON:  None Available. FINDINGS: Alignment: Normal. Skull base and vertebrae: No acute fracture. Vertebral body heights are maintained. The dens and skull base are intact. Vague area of sclerosis involving the left aspect of C4 vertebra has no suspicious features. Soft tissues and spinal canal: No prevertebral fluid or swelling. No visible canal hematoma. Disc levels: Mild diffuse anterior spurring. Moderate multilevel facet hypertrophy. Upper chest: Assessed on concurrent chest CT, reported separately Other: Carotid calcifications. IMPRESSION: 1. No acute fracture or subluxation of the cervical spine. 2. Multilevel degenerative change. Electronically Signed   By: Chadwick Colonel M.D.   On: 04/23/2024 18:26   DG Knee Complete 4 Views Left Result Date: 04/23/2024 CLINICAL DATA:  Left knee pain following a fall yesterday.  EXAM: LEFT KNEE - COMPLETE 4+ VIEW COMPARISON:  07/19/2010 FINDINGS: Tricompartmental degenerative changes with marked medial joint space narrowing with progression and stable marked patellofemoral joint space narrowing and extensive spur formation. Interval fragmentation of the superior spur. This is corticated without the appearance of an acute fracture. Moderate medial and lateral spur formation with corticated bony fragmentation medially. No acute fracture, dislocation or effusion. Atheromatous arterial calcifications. IMPRESSION: 1. No acute fracture. 2. Tricompartmental degenerative changes, marked in the medial and patellofemoral compartments. Electronically Signed   By: Catherin Closs M.D.   On: 04/23/2024 17:07    Pertinent labs & imaging results that were available during my care of the patient were reviewed by me and considered in my medical decision making (see MDM for details).  Medications Ordered in ED Medications  lactated ringers  infusion ( Intravenous New Bag/Given 04/23/24 2101)  insulin  aspart (novoLOG ) injection 0-9 Units ( Subcutaneous Not Given 04/23/24 1958)  acetaminophen  (TYLENOL ) tablet 650 mg (has no administration in time range)    Or  acetaminophen  (TYLENOL ) suppository 650 mg (has no administration in time range)  HYDROcodone -acetaminophen  (NORCO/VICODIN) 5-325 MG per tablet 1-2 tablet (1 tablet Oral Given 04/23/24 2226)  morphine  (PF) 4 MG/ML injection 4 mg (4 mg Intravenous Given 04/23/24 1629)  ondansetron  (ZOFRAN ) injection 4 mg (4 mg Intravenous Given 04/23/24 1629)  lactated ringers  bolus 1,000 mL (0 mLs Intravenous Stopped 04/23/24 2059)  potassium chloride  SA (KLOR-CON  M) CR tablet 20 mEq (20 mEq Oral Given 04/23/24 1951)                                                                                                                                     Procedures .Critical Care  Performed by: Dakota Stangl,  Maleek Craver, MD Authorized by: Karlyn Overman, MD   Critical care provider  statement:    Critical care time (minutes):  30   Critical care was necessary to treat or prevent imminent or life-threatening deterioration of the following conditions:  Renal failure and dehydration   Critical care was time spent personally by me on the following activities:  Development of treatment plan with patient or surrogate, discussions with consultants, evaluation of patient's response to treatment, examination of patient, ordering and review of laboratory studies, ordering and review of radiographic studies, ordering and performing treatments and interventions, pulse oximetry, re-evaluation of patient's condition and review of old charts   (including critical care time)  Medical Decision Making / ED Course   This patient presents to the ED for concern of syncope, knee pain, back pain, this involves an extensive number of treatment options, and is a complaint that carries with it a high risk of complications and morbidity.  The differential diagnosis includes orthostatic syncope, cardiogenic syncope, vasovagal syncope, electrolyte abnormality, dehydration, dysrhythmia, vasovagal, Hypoglycemia, Seizure, Autonomic Insufficiency  MDM: Patient seen emergency room for evaluation of syncope, knee pain, chest and abdominal pain.  Physical exam with tenderness over bilateral ribs, left knee, abdomen.  Laboratory evaluation with a hemoglobin of 9.9 which is decreased for the patient, significant new AKI with BUN 47, creatinine 3.37 up from a baseline of 1.64.  CK5 22. trauma imaging reassuringly negative for acute traumatic injury outside of a small gluteal hematoma.  Aggressive fluid resuscitation begun for suspected prerenal AKI and he will acquire hospital admission for rehydration.  Patient admitted   Additional history obtained: -Additional history obtained from wife -External records from outside source obtained and reviewed including: Chart review including previous notes, labs, imaging,  consultation notes   Lab Tests: -I ordered, reviewed, and interpreted labs.   The pertinent results include:   Labs Reviewed  COMPREHENSIVE METABOLIC PANEL WITH GFR - Abnormal; Notable for the following components:      Result Value   Potassium 3.1 (*)    CO2 21 (*)    Glucose, Bld 162 (*)    BUN 47 (*)    Creatinine, Ser 3.73 (*)    Calcium  8.5 (*)    Albumin 3.3 (*)    GFR, Estimated 16 (*)    All other components within normal limits  CBC WITH DIFFERENTIAL/PLATELET - Abnormal; Notable for the following components:   RBC 3.53 (*)    Hemoglobin 9.9 (*)    HCT 31.2 (*)    All other components within normal limits  URINALYSIS, ROUTINE W REFLEX MICROSCOPIC - Abnormal; Notable for the following components:   APPearance HAZY (*)    All other components within normal limits  CK - Abnormal; Notable for the following components:   Total CK 522 (*)    All other components within normal limits  CBG MONITORING, ED - Abnormal; Notable for the following components:   Glucose-Capillary 117 (*)    All other components within normal limits  MAGNESIUM   PHOSPHORUS  CREATININE, URINE, RANDOM  SODIUM, URINE, RANDOM  LACTIC ACID, PLASMA  LACTIC ACID, PLASMA  OSMOLALITY, URINE  TSH  VITAMIN B12  FOLATE  IRON AND TIBC  FERRITIN  RETICULOCYTES  HEMOGLOBIN A1C  OSMOLALITY  TROPONIN I (HIGH SENSITIVITY)  TROPONIN I (HIGH SENSITIVITY)  TROPONIN I (HIGH SENSITIVITY)  TROPONIN I (HIGH SENSITIVITY)        Imaging Studies ordered: I ordered imaging studies including CT head, C-spine, chest abdomen pelvis, x-ray  knee I independently visualized and interpreted imaging. I agree with the radiologist interpretation   Medicines ordered and prescription drug management: Meds ordered this encounter  Medications   morphine  (PF) 4 MG/ML injection 4 mg   ondansetron  (ZOFRAN ) injection 4 mg   lactated ringers  bolus 1,000 mL   lactated ringers  infusion   insulin  aspart (novoLOG ) injection  0-9 Units    Correction coverage::   Sensitive (thin, NPO, renal)    CBG < 70::   Implement Hypoglycemia Standing Orders and refer to Hypoglycemia Standing Orders sidebar report    CBG 70 - 120::   0 units    CBG 121 - 150::   1 unit    CBG 151 - 200::   2 units    CBG 201 - 250::   3 units    CBG 251 - 300::   5 units    CBG 301 - 350::   7 units    CBG 351 - 400:   9 units    CBG > 400:   call MD and obtain STAT lab verification   OR Linked Order Group    acetaminophen  (TYLENOL ) tablet 650 mg    acetaminophen  (TYLENOL ) suppository 650 mg   HYDROcodone -acetaminophen  (NORCO/VICODIN) 5-325 MG per tablet 1-2 tablet   potassium chloride  SA (KLOR-CON  M) CR tablet 20 mEq    -I have reviewed the patients home medicines and have made adjustments as needed  Critical interventions Fluid resuscitation    Cardiac Monitoring: The patient was maintained on a cardiac monitor.  I personally viewed and interpreted the cardiac monitored which showed an underlying rhythm of: NSR  Social Determinants of Health:  Factors impacting patients care include: none   Reevaluation: After the interventions noted above, I reevaluated the patient and found that they have :improved  Co morbidities that complicate the patient evaluation  Past Medical History:  Diagnosis Date   Abrasion of face    11-15-2020  per pt stated has small area under one ear,  no drainage , no dressing, putting neosporin on it   Anemia associated with chronic renal failure    Anxiety    Arthritis    Chronic constipation    Chronic gout    per pt last episode approx. 2005  (followed [facility] )   Chronic pruritic rash in adult    intermittant on back   CKD (chronic kidney disease), stage III (HCC)    followed by nephrologist @ [facility]   Coronary artery disease    Diverticulosis of colon    DOE (dyspnea on exertion)    long distance walking and up flight of stairs but states ok after rest couple minutes   Dyspnea    History  of agent Orange exposure    History of basal cell carcinoma excision    back, stomach, face   History of CVA (cerebrovascular accident) without residual deficits 03-12-2014 in epic   acute/ subacute thalamic infarct, thrombotic secondary to severe cerebral vascular disease   History of DVT of lower extremity    History of kidney stones    Hyperlipidemia    Hyperplasia of prostate with lower urinary tract symptoms (LUTS)    Hypertension    followed by pcp [facility]    OSA on CPAP    severe per study in epic 08-31-2015,  states uses nightly   Peripheral neuropathy    Prostate cancer [facility]) urologist--- dr Secundino Dach;  oncologist--- dr Lorri Rota   first dx 02/ 2014 active survillance;  repeat  bx 08-29-2020 ,  Stage T1c, Gleason 3+4   Stroke [facility])    Type 2 diabetes mellitus treated with insulin  (HCC)    followed [facility]cp @ [facility]   (11-15-2020  per pt check blood sugar twice daily,  fasting sugar --- 110--120)   Wears glasses    Wears hearing aid in both ears    HOH even when he wears aids      Dispostion: I considered admission for this patient, and patient require hospital admission for severe prerenal AKI     Final Clinical Impression(s) / ED Diagnoses Final diagnoses:  AKI (acute kidney injury) (HCC)  Syncope, unspecified syncope type     @PCDICTATION @    Karlyn Overman, MD 04/23/24 [id number]

## 2024-04-23 NOTE — Assessment & Plan Note (Signed)
 Will gently replace taking CKD into acount

## 2024-04-23 NOTE — Assessment & Plan Note (Signed)
 Wil rehydrate, check orthostatic prior to discharge

## 2024-04-23 NOTE — Assessment & Plan Note (Signed)
 Hold aspirin  given gluteal hematoma

## 2024-04-23 NOTE — Assessment & Plan Note (Signed)
-  will monitor on tele avoid QT prolonging medications, rehydrate correct electrolytes ? ?

## 2024-04-23 NOTE — Assessment & Plan Note (Signed)
-    evidence of acute renal failure due to presence of following: Cr increased >0.3 from baseline  likely secondary to dehydration       check FeNA       Rehydrate with IV fluids         History does not suggest urinary retention or obstruction       If persists despite fluid resuscitation will need renal consult.

## 2024-04-23 NOTE — H&P (Signed)
 [name] KGM:[id number] DOB: Mar 28, 1946 DOA: 04/23/2024     PCP: [facility]    Patient arrived to ER on 04/23/24 at 1417 Referred by Attending Kommor, Alyse July, MD   Patient coming from:    home Lives With family    Chief Complaint:   Chief Complaint  Patient presents with   Fall   Weakness    HPI: [name] is a 78 y.o. male with medical history significant of CKD, prostate cancer, DM2, agent orange exposure, with radioactive bead placement and multiple associated surgical complications requiring anal drain, Fournier's gangrene  CAD, hx of CVA, hx of DVT, HLD, HTN, OSA    Presented with fall ,syncope Fall last night down for 12 h He was using the bathroom, felt lightheaded, no CP no SOB  fell over and could not get up No recent med changes    Rib pain, knee pain Has chronic CKD at baseline cr 1.7 and today Cr 3.73     Fell at home when he got up from the bathroom , felt lightheaded and synopsized once he was down woke u right away, he has had similar episodes in the pst. His wife could not get him up and he refused to call EMS    Of note pt was told he has a "shadow on the bottom of his heart"  No fever n o chills, no blood in stool Denies significant ETOH intake   Does not smoke      Regarding pertinent Chronic problems:    Hyperlipidemia -  on statins Lipitor (atorvastatin )  Lipid Panel     Component Value Date/Time   CHOL 150 03/13/2014 0550   TRIG 200 (H) 03/13/2014 0550   HDL 32 (L) 03/13/2014 0550   CHOLHDL 4.7 03/13/2014 0550   VLDL 40 03/13/2014 0550   LDLCALC 78 03/13/2014 0550     HTN on NOrvasc , coreg ,    CAD  - On Aspirin , statin, betablocker,                 - followed by cardiology at [facility]: Lambert Rhonda W            DM 2 -  Lab Results  Component Value Date   HGBA1C 6.5 (H) 04/01/2021   on insulin     obesity-   BMI Readings from Last 1 Encounters:  04/23/24 32.42 kg/m       OSA -on nocturnal  CPAP,     Hx of CVA -  with  residual  deficits ( word finding difficulty)  on Aspirin  81 mg      Hx of DVT/PE 2015  no longer on anticoagulation  takes aspirin  1 mg x2    CKD stage IIIa  baseline Cr 1.7 Estimated Creatinine Clearance: 19.6 mL/min (A) (by C-G formula based on SCr of 3.73 mg/dL (H)).  Lab Results  Component Value Date   CREATININE 3.73 (H) 04/23/2024   CREATININE 1.64 (H) 11/28/2023   CREATININE 2.08 (H) 05/17/2023   Lab Results  Component Value Date   NA 139 04/23/2024   CL 107 04/23/2024   K 3.1 (L) 04/23/2024   CO2 21 (L) 04/23/2024   BUN 47 (H) 04/23/2024   CREATININE 3.73 (H) 04/23/2024   GFRNONAA 16 (L) 04/23/2024   CALCIUM  8.5 (L) 04/23/2024   ALBUMIN 3.3 (L) 04/23/2024   GLUCOSE 162 (H) 04/23/2024      BPH - on Flomax ,          Chronic anemia -  baseline hg Hemoglobin & Hematocrit  Recent Labs    05/17/23 1726 11/28/23 1622 04/23/24 1626  HGB 10.2* 11.9* 9.9*   Iron/TIBC/Ferritin/ %Sat    Component Value Date/Time   IRON 22 (L) 04/04/2021 0408   TIBC 180 (L) 04/04/2021 0408   FERRITIN 523 (H) 04/04/2021 0408   IRONPCTSAT 12 (L) 04/04/2021 0408   IRONPCTSAT 12 (L) 01/03/2012 1610     Cancer: prostate cancer     While in ER:    CT showed gluteal hematoma     Lab Orders         Comprehensive metabolic panel         CBC with Differential         Urinalysis, Routine w reflex microscopic -Urine, Clean Catch         CK     Left knee - neg  CT HEAD   NON acute chronic small vessel ischemia    Remote lacunar infarct in the left caudate.  CT neck - neg    CTabd/pelvis chest - gluteal hematoma    Left perianal/perirectal catheter within a fistula.   Following Medications were ordered in ER: Medications  lactated ringers  infusion (has no administration in time range)  morphine  (PF) 4 MG/ML injection 4 mg (4 mg Intravenous Given 04/23/24 1629)  ondansetron  (ZOFRAN ) injection 4 mg (4 mg Intravenous Given 04/23/24 1629)  lactated ringers  bolus 1,000 mL (1,000 mLs  Intravenous New Bag/Given 04/23/24 1835)       ED Triage Vitals  Encounter Vitals Group     BP 04/23/24 1425 (!) 136/58     Systolic BP Percentile --      Diastolic BP Percentile --      Pulse Rate 04/23/24 1425 89     Resp 04/23/24 1425 16     Temp 04/23/24 1425 98.2 F (36.8 C)     Temp Source 04/23/24 1425 Oral     SpO2 04/23/24 1425 96 %     Weight 04/23/24 1815 225 lb 15.5 oz (102.5 kg)     Height 04/23/24 1815 5\' 10"  (1.778 m)     Head Circumference --      Peak Flow --      Pain Score 04/23/24 1815 0     Pain Loc --      Pain Education --      Exclude from Growth Chart --   RUEA(54)@     _________________________________________ Significant initial  Findings: Abnormal Labs Reviewed  COMPREHENSIVE METABOLIC PANEL WITH GFR - Abnormal; Notable for the following components:      Result Value   Potassium 3.1 (*)    CO2 21 (*)    Glucose, Bld 162 (*)    BUN 47 (*)    Creatinine, Ser 3.73 (*)    Calcium  8.5 (*)    Albumin 3.3 (*)    GFR, Estimated 16 (*)    All other components within normal limits  CBC WITH DIFFERENTIAL/PLATELET - Abnormal; Notable for the following components:   RBC 3.53 (*)    Hemoglobin 9.9 (*)    HCT 31.2 (*)    All other components within normal limits  URINALYSIS, ROUTINE W REFLEX MICROSCOPIC - Abnormal; Notable for the following components:   APPearance HAZY (*)    All other components within normal limits  CK - Abnormal; Notable for the following components:   Total CK 522 (*)    All other components within normal limits      _________________________ Troponin  ordered Cardiac Panel (last 3 results) Recent Labs    04/23/24 1626  CKTOTAL 522*     ECG: Ordered Personally reviewed and interpreted by me showing: HR : 78  Rhythm:  , RBBB,  Ischemic changes*nonspecific changes, no evidence of ischemic changes QTC 511   BNP (last 3 results) Recent Labs    11/28/23 1622  BNP 39.4       The recent clinical data is shown  below. Vitals:   04/23/24 1425 04/23/24 1647 04/23/24 1815  BP: (!) 136/58 (!) 121/50   Pulse: 89 80   Resp: 16 16   Temp: 98.2 F (36.8 C) 98.5 F (36.9 C)   TempSrc: Oral Oral   SpO2: 96% 97%   Weight:   102.5 kg  Height:   5\' 10"  (1.778 m)    WBC     Component Value Date/Time   WBC 10.2 04/23/2024 1626   LYMPHSABS 1.8 04/23/2024 1626   LYMPHSABS 3.9 (H) 08/21/2013 1032   MONOABS 0.7 04/23/2024 1626   MONOABS 0.6 08/21/2013 1032   EOSABS 0.3 04/23/2024 1626   EOSABS 0.7 (H) 08/21/2013 1032   BASOSABS 0.0 04/23/2024 1626   BASOSABS 0.0 08/21/2013 1032        UA   no evidence of UTI    Urine analysis:    Component Value Date/Time   COLORURINE YELLOW 04/23/2024 1710   APPEARANCEUR HAZY (A) 04/23/2024 1710   LABSPEC 1.024 04/23/2024 1710   PHURINE 5.0 04/23/2024 1710   GLUCOSEU NEGATIVE 04/23/2024 1710   HGBUR NEGATIVE 04/23/2024 1710   BILIRUBINUR NEGATIVE 04/23/2024 1710   KETONESUR NEGATIVE 04/23/2024 1710   PROTEINUR NEGATIVE 04/23/2024 1710   UROBILINOGEN 0.2 09/17/2012 1837   NITRITE NEGATIVE 04/23/2024 1710   LEUKOCYTESUR NEGATIVE 04/23/2024 1710    __________________________________________________________ Recent Labs  Lab 04/23/24 1626  NA 139  K 3.1*  CO2 21*  GLUCOSE 162*  BUN 47*  CREATININE 3.73*  CALCIUM  8.5*    Cr   Up from baseline see below Lab Results  Component Value Date   CREATININE 3.73 (H) 04/23/2024   CREATININE 1.64 (H) 11/28/2023   CREATININE 2.08 (H) 05/17/2023    Recent Labs  Lab 04/23/24 1626  AST 21  ALT 14  ALKPHOS 111  BILITOT 0.7  PROT 6.5  ALBUMIN 3.3*   Lab Results  Component Value Date   CALCIUM  8.5 (L) 04/23/2024        Plt: Lab Results  Component Value Date   PLT 153 04/23/2024         Recent Labs  Lab 04/23/24 1626  WBC 10.2  NEUTROABS 7.3  HGB 9.9*  HCT 31.2*  MCV 88.4  PLT 153    HG/HCT  stable,      Component Value Date/Time   HGB 9.9 (L) 04/23/2024 1626   HGB 11.9 (L)  11/28/2023 1622   HGB 10.7 (L) 08/21/2013 1032   HCT 31.2 (L) 04/23/2024 1626   HCT 35.9 (L) 11/28/2023 1622   HCT 30.9 (L) 08/21/2013 1032   MCV 88.4 04/23/2024 1626   MCV 84 11/28/2023 1622   MCV 84.1 08/21/2013 1032     _______________________________________________ Hospitalist was called for admission for FAll, syncope and AKI   The following Work up has been ordered so far:  Orders Placed This Encounter  Procedures   CT HEAD WO CONTRAST ( )   CT CERVICAL SPINE WO CONTRAST   DG Knee Complete 4 Views Left   CT CHEST ABDOMEN PELVIS WO CONTRAST  Comprehensive metabolic panel   CBC with Differential   Urinalysis, Routine w reflex microscopic -Urine, Clean Catch   CK   Consult to hospitalist     OTHER Significant initial  Findings:  labs showing:     DM  labs:  HbA1C: No results for input(s): "HGBA1C" in the last 8760 hours.     CBG (last 3)  No results for input(s): "GLUCAP" in the last 72 hours.        Cultures:    Component Value Date/Time   SDES  04/01/2021 1603    URINE, CLEAN CATCH Performed at [facility], [address] [place], [place] [address]    SPECREQUEST  04/01/2021 1603    NONE Performed at [facility], [address] [place], [place] [address]    CULT  04/01/2021 1603    NO GROWTH Performed at [facility], [address] [place], [place] [address]    REPTSTATUS 04/02/2021 FINAL 04/01/2021 1603     Radiological Exams on Admission: CT HEAD WO CONTRAST ( ) Result Date: 04/23/2024 CLINICAL DATA:  Head trauma. Fall yesterday and again today. Dizziness before fall. EXAM: CT HEAD WITHOUT CONTRAST TECHNIQUE: Contiguous axial images were obtained from the base of the skull through the vertex without intravenous contrast. RADIATION DOSE REDUCTION: This exam was performed according to the departmental dose-optimization program which includes automated exposure control, adjustment of the mA  and/or kV according to patient size and/or use of iterative reconstruction technique. COMPARISON:  None Available. FINDINGS: Brain: No intracranial hemorrhage, mass effect, or midline shift. Age related atrophy. No hydrocephalus. The basilar cisterns are patent. Mild periventricular and deep white matter hypodensity typical of chronic small vessel ischemia. Remote lacunar infarct in the left caudate no evidence of territorial infarct or acute ischemia. No extra-axial or intracranial fluid collection. Vascular: Atherosclerosis of skullbase vasculature without hyperdense vessel or abnormal calcification. Skull: No fracture or focal lesion. Sinuses/Orbits: Mucosal thickening throughout ethmoid air cells. No sinus fluid levels. No mastoid effusion. Other: None IMPRESSION: 1. No acute intracranial abnormality. No skull fracture. 2. Age related atrophy and chronic small vessel ischemia. Remote lacunar infarct in the left caudate. Electronically Signed   By: Chadwick Colonel M.D.   On: 04/23/2024 18:31   CT CHEST ABDOMEN PELVIS WO CONTRAST Result Date: 04/23/2024 CLINICAL DATA:  Trauma EXAM: CT CHEST, ABDOMEN AND PELVIS WITHOUT CONTRAST TECHNIQUE: Multidetector CT imaging of the chest, abdomen and pelvis was performed following the standard protocol without IV contrast. RADIATION DOSE REDUCTION: This exam was performed according to the departmental dose-optimization program which includes automated exposure control, adjustment of the mA and/or kV according to patient size and/or use of iterative reconstruction technique. COMPARISON:  CT abdomen and pelvis 03/31/2021. FINDINGS: CT CHEST FINDINGS Cardiovascular: No significant vascular findings. Normal heart size. No pericardial effusion. There are atherosclerotic calcifications of the aorta and coronary arteries. Mediastinum/Nodes: There is a posterior left thyroid nodule measuring 11 mm. Thyroid gland is otherwise within normal limits. No enlarged lymph nodes are seen.  There is distal esophageal wall thickening and small hiatal hernia. Lungs/Pleura: There are atelectatic changes in the bilateral lower lobes. The lungs are otherwise clear. There is no pleural effusion or pneumothorax. Musculoskeletal: No acute fracture identified. CT ABDOMEN PELVIS FINDINGS Hepatobiliary: Gallstones are present. There is no biliary ductal dilatation. The liver is within normal limits. Pancreas: Unremarkable. No pancreatic ductal dilatation or surrounding inflammatory changes. Spleen: No splenic injury or perisplenic hematoma. Adrenals/Urinary Tract: No adrenal hemorrhage  or renal injury identified. Bladder is unremarkable. Stomach/Bowel: Stomach is within normal limits. Appendix is not seen. No evidence of bowel wall thickening, distention, or inflammatory changes. Sigmoid colon diverticulosis is present. Left perianal/perirectal fistula catheter present. No focal fluid collections are seen. Vascular/Lymphatic: Aortic atherosclerosis. No enlarged abdominal or pelvic lymph nodes. Reproductive: Prostate radiotherapy seeds are present. Other: Small fat containing umbilical hernia present. No ascites present. There is a 1.7 x 1.7 x 1.4 cm density in the left gluteal subcutaneous tissues, new from prior. Musculoskeletal: No acute fractures are seen. Right hip arthroplasty is present. IMPRESSION: 1. No acute posttraumatic sequelae in the chest, abdomen or pelvis. 2. Left perianal/perirectal catheter within a fistula. 3. 1.7 cm density in the left gluteal subcutaneous tissues, new from prior, possibly a hematoma. Enlarged lymph node is also in the differential. 4. Cholelithiasis. 5. Distal esophageal wall thickening and small hiatal hernia. 6. Aortic atherosclerosis. Aortic Atherosclerosis (ICD10-I70.0). Electronically Signed   By: Tyron Gallon M.D.   On: 04/23/2024 18:26   CT CERVICAL SPINE WO CONTRAST Result Date: 04/23/2024 CLINICAL DATA:  Fall yesterday and again today. Dizziness before fall. EXAM:  CT CERVICAL SPINE WITHOUT CONTRAST TECHNIQUE: Multidetector CT imaging of the cervical spine was performed without intravenous contrast. Multiplanar CT image reconstructions were also generated. RADIATION DOSE REDUCTION: This exam was performed according to the departmental dose-optimization program which includes automated exposure control, adjustment of the mA and/or kV according to patient size and/or use of iterative reconstruction technique. COMPARISON:  None Available. FINDINGS: Alignment: Normal. Skull base and vertebrae: No acute fracture. Vertebral body heights are maintained. The dens and skull base are intact. Vague area of sclerosis involving the left aspect of C4 vertebra has no suspicious features. Soft tissues and spinal canal: No prevertebral fluid or swelling. No visible canal hematoma. Disc levels: Mild diffuse anterior spurring. Moderate multilevel facet hypertrophy. Upper chest: Assessed on concurrent chest CT, reported separately Other: Carotid calcifications. IMPRESSION: 1. No acute fracture or subluxation of the cervical spine. 2. Multilevel degenerative change. Electronically Signed   By: Chadwick Colonel M.D.   On: 04/23/2024 18:26   DG Knee Complete 4 Views Left Result Date: 04/23/2024 CLINICAL DATA:  Left knee pain following a fall yesterday. EXAM: LEFT KNEE - COMPLETE 4+ VIEW COMPARISON:  07/19/2010 FINDINGS: Tricompartmental degenerative changes with marked medial joint space narrowing with progression and stable marked patellofemoral joint space narrowing and extensive spur formation. Interval fragmentation of the superior spur. This is corticated without the appearance of an acute fracture. Moderate medial and lateral spur formation with corticated bony fragmentation medially. No acute fracture, dislocation or effusion. Atheromatous arterial calcifications. IMPRESSION: 1. No acute fracture. 2. Tricompartmental degenerative changes, marked in the medial and patellofemoral  compartments. Electronically Signed   By: Catherin Closs M.D.   On: 04/23/2024 17:07   _______________________________________________________________________________________________________ Latest  Blood pressure (!) 121/50, pulse 80, temperature 98.5 F (36.9 C), temperature source Oral, resp. rate 16, height 5\' 10"  (1.778 m), weight 102.5 kg, SpO2 97%.   Vitals  labs and radiology finding personally reviewed  Review of Systems:    Pertinent positives include:  syncope  Constitutional:  No weight loss, night sweats, Fevers, chills, fatigue, weight loss  HEENT:  No headaches, Difficulty swallowing,Tooth/dental problems,Sore throat,  No sneezing, itching, ear ache, nasal congestion, post nasal drip,  Cardio-vascular:  No chest pain, Orthopnea, PND, anasarca, dizziness, palpitations.no Bilateral lower extremity swelling  GI:  No heartburn, indigestion, abdominal pain, nausea, vomiting, diarrhea, change in bowel habits, loss  of appetite, melena, blood in stool, hematemesis Resp:  no shortness of breath at rest. No dyspnea on exertion, No excess mucus, no productive cough, No non-productive cough, No coughing up of blood.No change in color of mucus.No wheezing. Skin:  no rash or lesions. No jaundice GU:  no dysuria, change in color of urine, no urgency or frequency. No straining to urinate.  No flank pain.  Musculoskeletal:  No joint pain or no joint swelling. No decreased range of motion. No back pain.  Psych:  No change in mood or affect. No depression or anxiety. No memory loss.  Neuro: no localizing neurological complaints, no tingling, no weakness, no double vision, no gait abnormality, no slurred speech, no confusion  All systems reviewed and apart from HOPI all are negative _______________________________________________________________________________________________ Past Medical History:   Past Medical History:  Diagnosis Date   Abrasion of face    11-15-2020  per pt  stated has small area under one ear,  no drainage , no dressing, putting neosporin on it   Anemia associated with chronic renal failure    Anxiety    Arthritis    Chronic constipation    Chronic gout    per pt last episode approx. 2005  ([facility] )   Chronic pruritic rash in adult    intermittant on back   CKD (chronic kidney disease), stage III (HCC)    followed [facility]rologist [facility]   Coronary artery disease    Diverticulosis of colon    DOE (dyspnea on exertion)    long distance walking and up flight of stairs but states ok after rest couple minutes   Dyspnea    History of agent Orange exposure    History of basal cell carcinoma excision    back, stomach, face   History of CVA (cerebrovascular accident) without residual deficits 03-12-2014 in epic   acute/ subacute thalamic infarct, thrombotic secondary to severe cerebral vascular disease   History of DVT of lower extremity    History of kidney stones    Hyperlipidemia    Hyperplasia of prostate with lower urinary tract symptoms (LUTS)    Hypertension    followed [facility]cp [facility]    OSA on CPAP    severe per study in epic 08-31-2015,  states uses nightly   Peripheral neuropathy    Prostate cancer [facility]) urologist--- dr Secundino Dach;  oncologist--- dr Lorri Rota   first dx 02/ 2014 active survillance;  repeat bx 08-29-2020 ,  Stage T1c, Gleason 3+4   Stroke [facility])    Type 2 diabetes mellitus treated with insulin  (HCC)    [facility]   (11-15-2020  per pt check blood sugar twice daily,  fasting sugar --- 110--120)   Wears glasses    Wears hearing aid in both ears    HOH even when he wears aids      Past Surgical History:  Procedure Laterality Date   APPENDECTOMY  age 89   BLEPHAROPLASTY Bilateral 2015   upper eyelid   CYSTOSCOPY N/A 11/16/2020   Procedure: CYSTOSCOPY FLEXIBLE;  Surgeon: Osborn Blaze, MD;  Location: [facility];  Service: Urology;  Laterality: N/A;  No seeds detected in bladder per Dr.  Secundino Dach   CYSTOSCOPY/RETROGRADE/URETEROSCOPY/STONE EXTRACTION WITH BASKET  2002   IRRIGATION AND DEBRIDEMENT ABSCESS N/A 04/01/2021   Procedure: EXAM  UNDER ANESTHESIA; ANOSCOPY; DRAINAGE OF PERIRECTAL ABSCESS; PLACEMENT OF SETON;  Surgeon: Caralyn Chandler, MD;  Location: [facility];  Service: General;  Laterality:  N/A;   MANDIBLE RECONSTRUCTION Left 2014   w/ cadaver bone   MOHS SURGERY  2012   nose   PILONIDAL CYST EXCISION  05/2020   RADIOACTIVE SEED IMPLANT N/A 11/16/2020   Procedure: RADIOACTIVE SEED IMPLANT/BRACHYTHERAPY IMPLANT;  Surgeon: Osborn Blaze, MD;  Location: [facility];  Service: Urology;  Laterality: N/A;  90 MINS   SPACE OAR INSTILLATION N/A 11/16/2020   Procedure: SPACE OAR INSTILLATION;  Surgeon: Osborn Blaze, MD;  Location: [facility];  Service: Urology;  Laterality: N/A;   TOTAL HIP ARTHROPLASTY Right 10-26-2003  @[facility]    TOTAL KNEE ARTHROPLASTY Right 01/22/2017   Procedure: RIGHT TOTAL KNEE ARTHROPLASTY;  Surgeon: Claiborne Crew, MD;  Location: [facility];  Service: Orthopedics;  Laterality: Right;    Social History:  Ambulatory   cane, walker       reports that he has never smoked. He has never used smokeless tobacco. He reports that he does not drink alcohol and does not use drugs.    Family History:   Family History  Problem Relation Age of Onset   Dementia Mother    Hypertension Mother    Diabetes Mother    Heart attack Father 71   Kidney cancer Brother        tumor removed   Hypertension Brother    Liver cancer Maternal Grandfather    Testicular cancer Brother    Cancer Paternal Aunt    Colon cancer Neg Hx    Colon polyps Neg Hx    Esophageal cancer Neg Hx    Stomach cancer Neg Hx    Rectal cancer Neg Hx    ______________________________________________________________________________________________ Allergies: No Known Allergies   Prior to Admission medications   Medication Sig Start Date End Date Taking?  Authorizing Provider  acetaminophen  (TYLENOL ) 500 MG tablet Take 1,000 mg by mouth every 6 (six) hours as needed for moderate pain.    [provider]  amitriptyline  (ELAVIL ) 50 MG tablet Take 50 mg by mouth at bedtime.  10/06/11   [provider]  amLODipine  (NORVASC ) 10 MG tablet Take 10 mg by mouth at bedtime.    [provider]  aspirin  EC 81 MG tablet Take 162 mg by mouth daily. Swallow whole.    [provider]  atorvastatin  (LIPITOR) 20 MG tablet Take 20 mg by mouth at bedtime.    [provider]  buPROPion  (WELLBUTRIN  XL) 150 MG 24 hr tablet Take 150 mg by mouth daily.    [provider]  camphor-menthol  Eyvonne Hollering) lotion Apply 1 application topically daily. 09/05/20   [provider]  carvedilol  (COREG ) 12.5 MG tablet Take 12.5 mg by mouth daily.    [provider]  colchicine  0.6 MG tablet Take 0.6 mg by mouth daily as needed (gout).    [provider]  febuxostat  (ULORIC ) 40 MG tablet Take 40 mg by mouth at bedtime.    [provider]  ferrous sulfate  (FERROUSUL) 325 (65 FE) MG tablet Take 1 tablet (325 mg total) by mouth 3 (three) times daily with meals. Patient taking differently: Take 325 mg by mouth daily. 01/22/17   Equilla Hastings, PA-C  furosemide (LASIX) 20 MG tablet Take 20 mg by mouth daily.    [provider]  gabapentin  (NEURONTIN ) 300 MG capsule Take 600 mg by mouth at bedtime.    [provider]  hydrocortisone  (ANUSOL -HC) 25 MG suppository Place 1 suppository (25 mg total) rectally 2 (two) times daily. 12/15/20   Bruning, Ashlyn,  PA-C  hydrocortisone  2.5 % cream Apply 1 application topically at bedtime.    [provider]  insulin  glargine (LANTUS ) 100 UNIT/ML injection Inject 0.15 mLs (15 Units total) into the skin at bedtime. Patient taking differently: Inject 35 Units into the skin at bedtime. 04/11/21   Bary Boss, DO  neomycin-bacitracin-polymyxin  (NEOSPORIN) ointment Apply 1 application topically daily as needed for wound care.    [provider]  nitroGLYCERIN (NITROSTAT) 0.4 MG SL tablet Place 0.4 mg under the tongue every 5 (five) minutes as needed for chest pain.    [provider]  polycarbophil (FIBERCON) 625 MG tablet Take 1 tablet (625 mg total) by mouth daily. 04/05/21   Annetta Killian, PA-C  Pyridoxine HCl (VITAMIN B-6 PO) Take 2 tablets by mouth daily.     [provider]  tamsulosin  (FLOMAX ) 0.4 MG CAPS capsule Take 1 capsule (0.4 mg total) by mouth daily after supper. 12/15/20   Bruning, Ashlyn, PA-C  Tetrahydrozoline HCl (EYE DROPS OP) Place 3 drops into both eyes daily.    [provider]  vitamin B-12 (CYANOCOBALAMIN ) 1000 MCG tablet Take 1,000 mcg by mouth daily.    [provider]    ___________________________________________________________________________________________________ Physical Exam:    04/23/2024    6:15 PM 04/23/2024    4:47 PM 04/23/2024    2:25 PM  Vitals with BMI  Height 5\' 10"     Weight 226 lbs    BMI 32.42    Systolic  121 136  Diastolic  50 58  Pulse  80 89     1. General:  in No  Acute distress   Chronically ill   -appearing 2. Psychological: Alert and   Oriented 3. Head/ENT:    Dry Mucous Membranes                          Head Non traumatic, neck supple                          Poor Dentition 4. SKIN:  decreased Skin turgor,  Skin clean Dry and intact no rash    5. Heart: Regular rate and rhythm no  Murmur, no Rub or gallop 6. Lungs , no wheezes or crackles   7. Abdomen: Soft,  non-tender,  distended   obese  bowel sounds present 8. Lower extremities: no clubbing, cyanosis, no  edema 9. Neurologically Grossly intact, moving all 4 extremities equally   10. MSK: Normal range of motion    Chart has been reviewed  ______________________________________________________________________________________________  Assessment/Plan 78 y.o. male  with medical history significant of CKD, prostate cancer, DM2, agent orange exposure, with radioactive bead placement and multiple associated surgical complications requiring anal drain, Fournier's gangrene  CAD, hx of CVA, hx of DVT, HLD, HTN, OSA  Admitted for  AKI, dehydration   Present on Admission:  AKI (acute kidney injury) (HCC)  Anemia  CKD (chronic kidney disease)  HTN (hypertension)  Hyperlipidemia  Dehydration  Hypokalemia  Prolonged QT interval  OSA (obstructive sleep apnea)     AKI (acute kidney injury) (HCC) -  evidence of acute renal failure due to presence of following: Cr increased >0.3 from baseline  likely secondary to dehydration       check FeNA       Rehydrate with IV fluids         History does not suggest urinary retention or obstruction  If persists despite fluid resuscitation will need renal consult.    Anemia Obtain anemia panel  Transfuse for Hg <7 , rapidly dropping or  if symptomatic   CKD (chronic kidney disease)  -chronic avoid nephrotoxic medications such as NSAIDs, Vanco Zosyn  combo,  avoid hypotension, continue to follow renal function   DM (diabetes mellitus) (HCC)  - Order Sensitive  SSI   - continue home insulin  but decreased to   10units,  -  check TSH and HgA1C  - Hold by mouth medications    HTN (hypertension) Allow permissive htn for tonight    Hyperlipidemia Conitnue lipitor 20 mg po q day  Dehydration Wil rehydrate, check orthostatic prior to discharge  History of stroke Hold aspirin  given gluteal hematoma  Hypokalemia Will gently replace taking CKD into acount  Prolonged QT interval - will monitor on tele avoid QT prolonging medications, rehydrate correct electrolytes   OSA (obstructive sleep apnea) Continue CPAP    Other plan as per orders.  DVT prophylaxis:  SCD      Code Status:    Code Status: Prior FULL CODE  as per patient   I had personally discussed CODE STATUS with patient   ACP    none    Family Communication:   Family not at  Bedside    Diet  diabetic   Disposition Plan:        To home once workup is complete and patient is stable   Following barriers for discharge:                              Syncope  work up is complete                            Electrolytes corrected                               Anemia  stable                                                         Will need to be able to tolerate PO                                  Consult Orders  (From admission, onward)           Start     Ordered   04/23/24 1845  Consult to hospitalist  Once       Provider:  (Not yet assigned)  Question Answer Comment  Place call to: [facility]   Reason for Consult Admit      04/23/24 1844                               Would benefit from PT/OT eval prior to DC  Ordered                                        Consults called:  NONE   Admission status:  ED Disposition     ED Disposition  Admit   Condition  --   Comment  Hospital [facility]: [facility] [[id number]]  Level of Care: Telemetry [5]  Admit to tele based on following criteria: Other see comments  Comments: syncope  May admit patient to [facility] or [facility] if equivalent level of care is available:: No  Covid Evaluation: Asymptomatic - no recent exposure (last 10 days) testing not required  Diagnosis: AKI (acute kidney injury) [facility]) [[id number]]  Admitting Physician: Dineen Conradt [[id number]]  Attending Physician: Geniyah Eischeid [[id number]]  Certification:: I certify this patient will need inpatient services for at least 2 midnights  Expected Medical Readiness: 04/25/2024            inpatient     I Expect 2 midnight stay secondary to severity of patient's current illness need for inpatient interventions justified by the following:     Severe lab/radiological/exam abnormalities including:   AKI and extensive comorbidities including:  DM2   CAD    Obesity  CKD   history of stroke with residual deficits   malignancy,   That are currently affecting medical management.   I expect  patient to be hospitalized for 2 midnights requiring inpatient medical care.  Patient is at high risk for adverse outcome (such as loss of life or disability) if not treated.  Indication for inpatient stay as follows:       Need for    IV fluids,,      Level of care     tele  For   24H     Brycelyn Gambino 04/23/2024, 8:04 PM    [facility]     after 2 AM please page floor coverage   If 7AM-7PM, please contact the day team taking care of the patient using [email or web address]

## 2024-04-23 NOTE — Subjective & Objective (Signed)
 Fall last night down for 12 h He was using the bathroom, felt lightheaded, no CP no SOB  fell over and could not get up No recent med changes    Rib pain, knee pain Has chronic CKD at baseline cr 1.7 and today Cr 3.73

## 2024-04-23 NOTE — Assessment & Plan Note (Signed)
 Continue CPAP.

## 2024-04-23 NOTE — Assessment & Plan Note (Signed)
 -  Order Sensitive  SSI   - continue home insulin but decreased to   10units,  -  check TSH and HgA1C  - Hold by mouth medications

## 2024-04-23 NOTE — Assessment & Plan Note (Signed)
-  chronic avoid nephrotoxic medications such as NSAIDs, Vanco Zosyn combo,  avoid hypotension, continue to follow renal function

## 2024-04-23 NOTE — Assessment & Plan Note (Signed)
 Obtain anemia panel  Transfuse for Hg <7 , rapidly dropping or  if symptomatic

## 2024-04-24 ENCOUNTER — Inpatient Hospital Stay (HOSPITAL_COMMUNITY)

## 2024-04-24 DIAGNOSIS — N179 Acute kidney failure, unspecified: Secondary | ICD-10-CM | POA: Diagnosis not present

## 2024-04-24 DIAGNOSIS — R55 Syncope and collapse: Secondary | ICD-10-CM

## 2024-04-24 LAB — RETICULOCYTES
Immature Retic Fract: 8.4 % (ref 2.3–15.9)
RBC.: 3.43 MIL/uL — ABNORMAL LOW (ref 4.22–5.81)
Retic Count, Absolute: 40.1 10*3/uL (ref 19.0–186.0)
Retic Ct Pct: 1.2 % (ref 0.4–3.1)

## 2024-04-24 LAB — COMPREHENSIVE METABOLIC PANEL WITH GFR
ALT: 14 U/L (ref 0–44)
AST: 22 U/L (ref 15–41)
Albumin: 3 g/dL — ABNORMAL LOW (ref 3.5–5.0)
Alkaline Phosphatase: 103 U/L (ref 38–126)
Anion gap: 10 (ref 5–15)
BUN: 44 mg/dL — ABNORMAL HIGH (ref 8–23)
CO2: 22 mmol/L (ref 22–32)
Calcium: 8.5 mg/dL — ABNORMAL LOW (ref 8.9–10.3)
Chloride: 108 mmol/L (ref 98–111)
Creatinine, Ser: 3.04 mg/dL — ABNORMAL HIGH (ref 0.61–1.24)
GFR, Estimated: 20 mL/min — ABNORMAL LOW (ref >60)
Glucose, Bld: 118 mg/dL — ABNORMAL HIGH (ref 70–99)
Potassium: 3.4 mmol/L — ABNORMAL LOW (ref 3.5–5.1)
Sodium: 140 mmol/L (ref 135–145)
Total Bilirubin: 0.4 mg/dL (ref 0.0–1.2)
Total Protein: 6.1 g/dL — ABNORMAL LOW (ref 6.5–8.1)

## 2024-04-24 LAB — OSMOLALITY: Osmolality: 314 mosm/kg — ABNORMAL HIGH (ref 275–295)

## 2024-04-24 LAB — ECHOCARDIOGRAM COMPLETE
Area-P 1/2: 2.75 cm2
Height: 70 in
S' Lateral: 3.9 cm
Single Plane A4C EF: 51.9 %
Weight: 3615.54 [oz_av]

## 2024-04-24 LAB — IRON AND TIBC
Iron: 36 ug/dL — ABNORMAL LOW (ref 45–182)
Saturation Ratios: 16 % — ABNORMAL LOW (ref 17.9–39.5)
TIBC: 227 ug/dL — ABNORMAL LOW (ref 250–450)
UIBC: 191 ug/dL

## 2024-04-24 LAB — CBC
HCT: 29.4 % — ABNORMAL LOW (ref 39.0–52.0)
Hemoglobin: 9.1 g/dL — ABNORMAL LOW (ref 13.0–17.0)
MCH: 27.7 pg (ref 26.0–34.0)
MCHC: 31 g/dL (ref 30.0–36.0)
MCV: 89.6 fL (ref 80.0–100.0)
Platelets: 158 10*3/uL (ref 150–400)
RBC: 3.28 MIL/uL — ABNORMAL LOW (ref 4.22–5.81)
RDW: 13.2 % (ref 11.5–15.5)
WBC: 7.3 10*3/uL (ref 4.0–10.5)
nRBC: 0 % (ref 0.0–0.2)

## 2024-04-24 LAB — TSH: TSH: 1.948 u[IU]/mL (ref 0.350–4.500)

## 2024-04-24 LAB — PHOSPHORUS: Phosphorus: 3.1 mg/dL (ref 2.5–4.6)

## 2024-04-24 LAB — GLUCOSE, CAPILLARY
Glucose-Capillary: 105 mg/dL — ABNORMAL HIGH (ref 70–99)
Glucose-Capillary: 106 mg/dL — ABNORMAL HIGH (ref 70–99)
Glucose-Capillary: 107 mg/dL — ABNORMAL HIGH (ref 70–99)
Glucose-Capillary: 113 mg/dL — ABNORMAL HIGH (ref 70–99)
Glucose-Capillary: 121 mg/dL — ABNORMAL HIGH (ref 70–99)
Glucose-Capillary: 140 mg/dL — ABNORMAL HIGH (ref 70–99)
Glucose-Capillary: 141 mg/dL — ABNORMAL HIGH (ref 70–99)

## 2024-04-24 LAB — LACTIC ACID, PLASMA
Lactic Acid, Venous: 2.4 mmol/L (ref 0.5–1.9)
Lactic Acid, Venous: 1.2 mmol/L (ref 0.5–1.9)

## 2024-04-24 LAB — HEMOGLOBIN A1C
Hgb A1c MFr Bld: 7.1 % — ABNORMAL HIGH (ref 4.8–5.6)
Mean Plasma Glucose: 157.07 mg/dL

## 2024-04-24 LAB — MAGNESIUM: Magnesium: 1.9 mg/dL (ref 1.7–2.4)

## 2024-04-24 LAB — VITAMIN B12: Vitamin B-12: 778 pg/mL (ref 180–914)

## 2024-04-24 LAB — FERRITIN: Ferritin: 166 ng/mL (ref 24–336)

## 2024-04-24 MED ORDER — ADULT MULTIVITAMIN W/MINERALS CH
1.0000 | ORAL_TABLET | Freq: Every day | ORAL | Status: DC
  Administered 2024-04-24 – 2024-04-27 (×4): 1 via ORAL
  Filled 2024-04-24 (×4): qty 1

## 2024-04-24 MED ORDER — ENSURE MAX PROTEIN PO LIQD
11.0000 [oz_av] | Freq: Two times a day (BID) | ORAL | Status: DC
  Administered 2024-04-24 – 2024-04-27 (×6): 11 [oz_av] via ORAL
  Filled 2024-04-24 (×7): qty 330

## 2024-04-24 MED ORDER — CARVEDILOL 12.5 MG PO TABS
12.5000 mg | ORAL_TABLET | Freq: Two times a day (BID) | ORAL | Status: DC
  Administered 2024-04-24 – 2024-04-27 (×6): 12.5 mg via ORAL
  Filled 2024-04-24 (×6): qty 1

## 2024-04-24 MED ORDER — SODIUM CHLORIDE 0.9 % IV SOLN: INTRAVENOUS | Status: AC

## 2024-04-24 MED ORDER — SODIUM CHLORIDE 0.9 % IV BOLUS
500.0000 mL | Freq: Once | INTRAVENOUS | Status: AC
  Administered 2024-04-24: 500 mL via INTRAVENOUS

## 2024-04-24 NOTE — Progress Notes (Signed)
 Initial Nutrition Assessment  DOCUMENTATION CODES:   Obesity unspecified  INTERVENTION:   -Ensure MAX Protein po BID, each supplement provides 150 kcal and 30 grams of protein   -Multivitamin with minerals daily  NUTRITION DIAGNOSIS:   Increased nutrient needs related to chronic illness as evidenced by estimated needs.  GOAL:   Patient will meet greater than or equal to 90% of their needs  MONITOR:   PO intake, Supplement acceptance  REASON FOR ASSESSMENT:   Consult Assessment of nutrition requirement/status  ASSESSMENT:   78 y.o. male with medical history significant of CKD, prostate cancer, DM2, agent orange exposure, with radioactive bead placement and multiple associated surgical complications requiring anal drain, Fournier's gangrene  CAD, hx of CVA, hx of DVT, HLD, HTN, OSA. Admitted for AKI.  Patient in room, receiving patient care. Unable to speak with patient at this time. Per chart review, pt admitted with dehydration. Increased weakness reported lately. Will order protein shakes to provide additional kcals and protein. SLP evaluated this morning, recommending regular consistency diet.  Per weight records, pt's weight has been stable over the past year.   Medications: Lactated ringers   Labs reviewed: CBGs: 107-140 Low K Low iron   NUTRITION - FOCUSED PHYSICAL EXAM:  Unable to complete  Diet Order:   Diet Order             Diet Carb Modified Fluid consistency: Thin; Room service appropriate? Yes  Diet effective now                   EDUCATION NEEDS:   Not appropriate for education at this time  Skin:  Skin Assessment: Reviewed RN Assessment  Last BM:  PTA  Height:   Ht Readings from Last 1 Encounters:  04/23/24 5\' 10"  (1.778 m)    Weight:   Wt Readings from Last 1 Encounters:  04/23/24 102.5 kg    BMI:  Body mass index is 32.42 kg/m.  Estimated Nutritional Needs:   Kcal:  1800-2000  Protein:  100-115g  Fluid:   2L/day  Arna Better, MS, RD, LDN Inpatient Clinical Dietitian Contact via Secure chat

## 2024-04-24 NOTE — Progress Notes (Signed)
 PROGRESS NOTE    [name]  BJY:[id number] DOB: 09-03-1946 DOA: 04/23/2024 PCP: [facility]   Brief Narrative: [name] is a 78 y.o. male with a history of CKD, CAD, diabetes mellitus, prostate cancer, agent orange exposure, transsphincteric anal fistula s/p drain, CVA, DVT, hyperlipidemia, hypertension, OSA.  Patient presented secondary to a fall with weakness and found to have evidence of AKI. IV fluids started.   Assessment and Plan:  AKI on CKD stage 3a Baseline creatinine of about 1.6 from earlier this year. Creatinine of 3.73 on admission. IV fluids started. Creatinine improved slightly to 3.04 today. -Continue IV fluids -BMP in AM  Presyncope Patient with lightheadedness and fall. CT imaging without acute intracranial process. Transthoracic Echocardiogram ordered and is pending. -Follow-up Transthoracic Echocardiogram results -PT/OT  Left knee pain Secondary to fall. Knee x-ray without evidence of fracture.  Diabetes mellitus type 2 Well controlled for age. -Continue Semglee  and SSI  Chronic anemia stable  Primary hypertension Patient is on amlodipine  and Coreg  -Resume home Coreg   Hyperlipidemia Patient is on Lipitor as an outpatient -Continue Lipitor  History of stroke Noted. Patient is on aspirin  as an outpatient.  Hypokalemia -Potassium supplementation  OSA -Continue CPAP at bedtime   Prolonged QTc Noted.   DVT prophylaxis: SCDs Code Status:   Code Status: Full Code Family Communication: Nephew at bedside Disposition Plan: Discharge home likely in AM once kidney function is improved   Consultants:  None  Procedures:  None  Antimicrobials: None    Subjective: Patient reports some knee pain. Otherwise no concerns.  Objective: BP 125/69 (BP Location: Right Arm)   Pulse 79   Temp 98.2 F (36.8 C) (Oral)   Resp 18   Ht 5\' 10"  (1.778 m)   Wt 102.5 kg   SpO2 97%   BMI 32.42 kg/m   Examination:  General exam:  Appears calm and comfortable Respiratory system: Clear to auscultation. Respiratory effort normal. Cardiovascular system: S1 & S2 heard, RRR. No murmurs, rubs, gallops or clicks. Gastrointestinal system: Abdomen is nondistended, soft and nontender. Normal bowel sounds heard. Central nervous system: Alert and oriented. No focal neurological deficits. Musculoskeletal: Left knee without effusion noted. No calf tenderness Psychiatry: Judgement and insight appear normal. Mood & affect appropriate.    Data Reviewed: I have personally reviewed following labs and imaging studies  CBC Lab Results  Component Value Date   WBC 7.3 04/24/2024   RBC 3.28 (L) 04/24/2024   HGB 9.1 (L) 04/24/2024   HCT 29.4 (L) 04/24/2024   MCV 89.6 04/24/2024   MCH 27.7 04/24/2024   PLT 158 04/24/2024   MCHC 31.0 04/24/2024   RDW 13.2 04/24/2024   LYMPHSABS 1.8 04/23/2024   MONOABS 0.7 04/23/2024   EOSABS 0.3 04/23/2024   BASOSABS 0.0 04/23/2024     Last metabolic panel Lab Results  Component Value Date   NA 140 04/24/2024   K 3.4 (L) 04/24/2024   CL 108 04/24/2024   CO2 22 04/24/2024   BUN 44 (H) 04/24/2024   CREATININE 3.04 (H) 04/24/2024   GLUCOSE 118 (H) 04/24/2024   GFRNONAA 20 (L) 04/24/2024   GFRAA 54 (L) 01/23/2017   CALCIUM  8.5 (L) 04/24/2024   PHOS 3.1 04/24/2024   PROT 6.1 (L) 04/24/2024   ALBUMIN 3.0 (L) 04/24/2024   BILITOT 0.4 04/24/2024   ALKPHOS 103 04/24/2024   AST 22 04/24/2024   ALT 14 04/24/2024   ANIONGAP 10 04/24/2024    GFR: Estimated Creatinine Clearance: 24 mL/min (  A) (by C-G formula based on SCr of 3.04 mg/dL (H)).  No results found for this or any previous visit (from the past 240 hours).    Radiology Studies: CT HEAD WO CONTRAST ( ) Result Date: 04/23/2024 CLINICAL DATA:  Head trauma. Fall yesterday and again today. Dizziness before fall. EXAM: CT HEAD WITHOUT CONTRAST TECHNIQUE: Contiguous axial images were obtained from the base of the skull through the  vertex without intravenous contrast. RADIATION DOSE REDUCTION: This exam was performed according to the departmental dose-optimization program which includes automated exposure control, adjustment of the mA and/or kV according to patient size and/or use of iterative reconstruction technique. COMPARISON:  None Available. FINDINGS: Brain: No intracranial hemorrhage, mass effect, or midline shift. Age related atrophy. No hydrocephalus. The basilar cisterns are patent. Mild periventricular and deep white matter hypodensity typical of chronic small vessel ischemia. Remote lacunar infarct in the left caudate no evidence of territorial infarct or acute ischemia. No extra-axial or intracranial fluid collection. Vascular: Atherosclerosis of skullbase vasculature without hyperdense vessel or abnormal calcification. Skull: No fracture or focal lesion. Sinuses/Orbits: Mucosal thickening throughout ethmoid air cells. No sinus fluid levels. No mastoid effusion. Other: None IMPRESSION: 1. No acute intracranial abnormality. No skull fracture. 2. Age related atrophy and chronic small vessel ischemia. Remote lacunar infarct in the left caudate. Electronically Signed   By: Chadwick Colonel M.D.   On: 04/23/2024 18:31   CT CHEST ABDOMEN PELVIS WO CONTRAST Result Date: 04/23/2024 CLINICAL DATA:  Trauma EXAM: CT CHEST, ABDOMEN AND PELVIS WITHOUT CONTRAST TECHNIQUE: Multidetector CT imaging of the chest, abdomen and pelvis was performed following the standard protocol without IV contrast. RADIATION DOSE REDUCTION: This exam was performed according to the departmental dose-optimization program which includes automated exposure control, adjustment of the mA and/or kV according to patient size and/or use of iterative reconstruction technique. COMPARISON:  CT abdomen and pelvis 03/31/2021. FINDINGS: CT CHEST FINDINGS Cardiovascular: No significant vascular findings. Normal heart size. No pericardial effusion. There are atherosclerotic  calcifications of the aorta and coronary arteries. Mediastinum/Nodes: There is a posterior left thyroid nodule measuring 11 mm. Thyroid gland is otherwise within normal limits. No enlarged lymph nodes are seen. There is distal esophageal wall thickening and small hiatal hernia. Lungs/Pleura: There are atelectatic changes in the bilateral lower lobes. The lungs are otherwise clear. There is no pleural effusion or pneumothorax. Musculoskeletal: No acute fracture identified. CT ABDOMEN PELVIS FINDINGS Hepatobiliary: Gallstones are present. There is no biliary ductal dilatation. The liver is within normal limits. Pancreas: Unremarkable. No pancreatic ductal dilatation or surrounding inflammatory changes. Spleen: No splenic injury or perisplenic hematoma. Adrenals/Urinary Tract: No adrenal hemorrhage or renal injury identified. Bladder is unremarkable. Stomach/Bowel: Stomach is within normal limits. Appendix is not seen. No evidence of bowel wall thickening, distention, or inflammatory changes. Sigmoid colon diverticulosis is present. Left perianal/perirectal fistula catheter present. No focal fluid collections are seen. Vascular/Lymphatic: Aortic atherosclerosis. No enlarged abdominal or pelvic lymph nodes. Reproductive: Prostate radiotherapy seeds are present. Other: Small fat containing umbilical hernia present. No ascites present. There is a 1.7 x 1.7 x 1.4 cm density in the left gluteal subcutaneous tissues, new from prior. Musculoskeletal: No acute fractures are seen. Right hip arthroplasty is present. IMPRESSION: 1. No acute posttraumatic sequelae in the chest, abdomen or pelvis. 2. Left perianal/perirectal catheter within a fistula. 3. 1.7 cm density in the left gluteal subcutaneous tissues, new from prior, possibly a hematoma. Enlarged lymph node is also in the differential. 4. Cholelithiasis. 5. Distal esophageal wall  thickening and small hiatal hernia. 6. Aortic atherosclerosis. Aortic Atherosclerosis  (ICD10-I70.0). Electronically Signed   By: Tyron Gallon M.D.   On: 04/23/2024 18:26   CT CERVICAL SPINE WO CONTRAST Result Date: 04/23/2024 CLINICAL DATA:  Fall yesterday and again today. Dizziness before fall. EXAM: CT CERVICAL SPINE WITHOUT CONTRAST TECHNIQUE: Multidetector CT imaging of the cervical spine was performed without intravenous contrast. Multiplanar CT image reconstructions were also generated. RADIATION DOSE REDUCTION: This exam was performed according to the departmental dose-optimization program which includes automated exposure control, adjustment of the mA and/or kV according to patient size and/or use of iterative reconstruction technique. COMPARISON:  None Available. FINDINGS: Alignment: Normal. Skull base and vertebrae: No acute fracture. Vertebral body heights are maintained. The dens and skull base are intact. Vague area of sclerosis involving the left aspect of C4 vertebra has no suspicious features. Soft tissues and spinal canal: No prevertebral fluid or swelling. No visible canal hematoma. Disc levels: Mild diffuse anterior spurring. Moderate multilevel facet hypertrophy. Upper chest: Assessed on concurrent chest CT, reported separately Other: Carotid calcifications. IMPRESSION: 1. No acute fracture or subluxation of the cervical spine. 2. Multilevel degenerative change. Electronically Signed   By: Chadwick Colonel M.D.   On: 04/23/2024 18:26   DG Knee Complete 4 Views Left Result Date: 04/23/2024 CLINICAL DATA:  Left knee pain following a fall yesterday. EXAM: LEFT KNEE - COMPLETE 4+ VIEW COMPARISON:  07/19/2010 FINDINGS: Tricompartmental degenerative changes with marked medial joint space narrowing with progression and stable marked patellofemoral joint space narrowing and extensive spur formation. Interval fragmentation of the superior spur. This is corticated without the appearance of an acute fracture. Moderate medial and lateral spur formation with corticated bony fragmentation  medially. No acute fracture, dislocation or effusion. Atheromatous arterial calcifications. IMPRESSION: 1. No acute fracture. 2. Tricompartmental degenerative changes, marked in the medial and patellofemoral compartments. Electronically Signed   By: Catherin Closs M.D.   On: 04/23/2024 17:07      LOS: 1 day    Aneita Keens, MD [facility] 04/24/2024, 2:52 PM   If 7PM-7AM, please contact night-coverage [email or web address]

## 2024-04-24 NOTE — Hospital Course (Addendum)
 [name] is a 78 y.o. male with a history of CKD, CAD, diabetes mellitus, prostate cancer, agent orange exposure, transsphincteric anal fistula s/p drain, CVA, DVT, hyperlipidemia, hypertension, OSA.  Patient presented secondary to a fall with weakness and found to have evidence of AKI. IV fluids started with improvement in renal function.

## 2024-04-24 NOTE — Evaluation (Signed)
 Physical Therapy Evaluation Patient Details Name: [name] MRN: [id number] DOB: 09-27-1946 Today's Date: 04/24/2024  History of Present Illness  [name] is a 78 y.o. male presents after a fall. CT head  No acute intracranial abnormality, No skull fracture. CT cervical No acute fracture or subluxation of the cervical spine. L knee xray No acute fracture. PMH: CKD, prostate cancer, DM2, agent orange exposure, with radioactive bead placement and multiple associated surgical complications requiring anal drain, Fournier's gangrene  CAD, hx of CVA, hx of DVT, HLD, HTN, OSA  Clinical Impression  Pt admitted with above diagnosis. Pt from home with spouse, reports 4-5 steps to enter the home depending on entry, using SPC in the home and RW outside at baseline, also spouse assisting with LB dressing as needed. On eval, pt limited by L knee pain, L knee buckling with weight acceptance requiring blocking and assist to prevent fall. Pt unable to take steps away from EOB, able to pivot to recliner with strong UE assist on RW and min A from therapist. Pt has multiple steps to climb to get into home and unable to take steps on level ground away from seated support at eval. Patient will benefit from continued inpatient follow up therapy, <3 hours/day vs HHPT pending progress and pain relief in hospital. Pt currently with functional limitations due to the deficits listed below (see PT Problem List). Pt will benefit from acute skilled PT to increase their independence and safety with mobility to allow discharge.           If plan is discharge home, recommend the following: A lot of help with walking and/or transfers;A lot of help with bathing/dressing/bathroom;Assistance with cooking/housework;Assist for transportation;Help with stairs or ramp for entrance   Can travel by private vehicle   No    Equipment Recommendations None recommended by PT  Recommendations for Other Services       Functional Status  Assessment Patient has had a recent decline in their functional status and demonstrates the ability to make significant improvements in function in a reasonable and predictable amount of time.     Precautions / Restrictions        Mobility  Bed Mobility Overal bed mobility: Needs Assistance Bed Mobility: Supine to Sit, Sit to Supine     Supine to sit: Min assist Sit to supine: Contact guard assist   General bed mobility comments: min A to upright into sitting, pt pulling on therapist's hand as bedrail    Transfers Overall transfer level: Needs assistance Equipment used: Rolling walker (2 wheels) Transfers: Sit to/from Stand, Bed to chair/wheelchair/BSC Sit to Stand: Min assist   Step pivot transfers: Min assist       General transfer comment: min A to power up, pt bracing with BLE against front of bed, cues for hand placement and forward weight shift, appears to shift weight to RLE with powering up; min A to step pivot to recliner    Ambulation/Gait               General Gait Details: pt performs marching in place and single step forward then back, L knee noted to buckle 50% of the time requiring blocking to prevent fall, unable to ambulate away from EOB due to L knee pain and buckling  Stairs            Wheelchair Mobility     Tilt Bed    Modified Rankin (Stroke Patients Only)  Balance Overall balance assessment: Needs assistance Sitting-balance support: Feet supported Sitting balance-Leahy Scale: Good     Standing balance support: Reliant on assistive device for balance, During functional activity, Bilateral upper extremity supported Standing balance-Leahy Scale: Poor Standing balance comment: L knee buckling with weight acceptance                             Pertinent Vitals/Pain Pain Assessment Pain Assessment: Faces Faces Pain Scale: Hurts even more Pain Location: L knee with weight bearing Pain Descriptors / Indicators:  Sore Pain Intervention(s): Monitored during session, Limited activity within patient's tolerance, Repositioned    Home Living Family/patient expects to be discharged to:: Private residence Living Arrangements: Spouse/significant other Available Help at Discharge: Family;Available 24 hours/day Type of Home: House Home Access: Stairs to enter   [employer] of Steps: 4 steps with no handrails at backdoor (primary entrance) and 5 steps with handrails at front door     Home Equipment: Agricultural consultant (2 wheels);Cane - single point;Shower seat;Grab bars - tub/shower;Rollator (4 wheels) Additional Comments: pt reports son comes over daily to assist with household chores, son completes grocery shopping    Prior Function Prior Level of Function : Needs assist             Mobility Comments: pt reports using SPC in the home and RW outside ADLs Comments: pt reports spouse assists with LB dressing at times, ind with toileting and showering, spouse completes household chores     Extremity/Trunk Assessment   Upper Extremity Assessment Upper Extremity Assessment: Defer to OT evaluation    Lower Extremity Assessment Lower Extremity Assessment: RLE deficits/detail;LLE deficits/detail RLE Deficits / Details: AROM WFL, strength grossly 4/5 RLE Sensation: WNL RLE Coordination: WNL LLE Deficits / Details: AROM WFL, slow to complete LAQ with increased pain, hip AROM WFL LLE Sensation: WNL LLE Coordination: WNL    Cervical / Trunk Assessment Cervical / Trunk Assessment: Kyphotic  Communication   Communication Communication: Impaired Factors Affecting Communication: Hearing impaired (HOH)    Cognition Arousal: Alert Behavior During Therapy: WFL for tasks assessed/performed   PT - Cognitive impairments: No apparent impairments                         Following commands: Intact       Cueing       General Comments      Exercises     Assessment/Plan    PT  Assessment Patient needs continued PT services  PT Problem List Decreased strength;Decreased activity tolerance;Decreased balance;Decreased mobility;Pain       PT Treatment Interventions DME instruction;Gait training;Stair training;Functional mobility training;Therapeutic activities;Therapeutic exercise;Balance training;Neuromuscular re-education;Patient/family education    PT Goals (Current goals can be found in the Care Plan section)  Acute Rehab PT Goals Patient Stated Goal: return home PT Goal Formulation: With patient Time For Goal Achievement: 05/08/24 Potential to Achieve Goals: Good    Frequency Min 3X/week     Co-evaluation               AM-PAC PT "6 Clicks" Mobility  Outcome Measure Help needed turning from your back to your side while in a flat bed without using bedrails?: A Little Help needed moving from lying on your back to sitting on the side of a flat bed without using bedrails?: A Little Help needed moving to and from a bed to a chair (including a wheelchair)?: A Little Help needed  standing up from a chair using your arms (e.g., wheelchair or bedside chair)?: A Little Help needed to walk in hospital room?: Total Help needed climbing 3-5 steps with a railing? : Total 6 Click Score: 14    End of Session Equipment Utilized During Treatment: Gait belt Activity Tolerance: Patient tolerated treatment well;Patient limited by pain Patient left: in chair;with call bell/phone within reach;with chair alarm set Nurse Communication: Mobility status PT Visit Diagnosis: Muscle weakness (generalized) (M62.81);Pain;Difficulty in walking, not elsewhere classified (R26.2) Pain - Right/Left: Left Pain - part of body: Knee    Time: 0865-7846 PT Time Calculation (min) (ACUTE ONLY): 36 min   Charges:   PT Evaluation $PT Eval Moderate Complexity: 1 Mod PT Treatments $Therapeutic Activity: 8-22 mins PT General Charges $$ ACUTE PT VISIT: 1 Visit         Tori  Finch Costanzo PT, DPT 04/24/24, 12:41 PM

## 2024-04-24 NOTE — Progress Notes (Signed)
   04/24/24 0016  BiPAP/CPAP/SIPAP  $ Non-Invasive Home Ventilator  Initial  $ Face Mask Medium Yes  BiPAP/CPAP/SIPAP Pt Type Adult  BiPAP/CPAP/SIPAP DREAMSTATIOND  Mask Type Full face mask  Dentures removed? Not applicable  Mask Size Medium  EPAP 7 cmH2O  FiO2 (%) 21 %  Patient Home Machine No  Patient Home Mask No  Patient Home Tubing No  Auto Titrate No  Device Plugged into RED Power Outlet Yes  BiPAP/CPAP /SiPAP Vitals  Pulse Rate 85  Resp 16  SpO2 100 %  Bilateral Breath Sounds Clear;Diminished  MEWS Score/Color  MEWS Score 0  MEWS Score Color Marrie Sizer

## 2024-04-24 NOTE — TOC Initial Note (Signed)
 Transition of Care [facility]) - Initial/Assessment Note    Patient Details  Name: [name] MRN: [id number] Date of Birth: [birth year]  Transition of [facility]) CM/SW Contact:    Levie Ream, RN Phone Number: 04/24/2024, 4:31 PM  Clinical Narrative:                 Eldora Greet w/ pt and wife Daiquan Resnik [phone]) in room; pt says he lives at home; he plans to return at d/c; his wife will provide transportation; pt verified insurance; his PCP is Dr Britta Candy at [place]; he does not know the name of his CM; pt denied SDOH risks; he has cane, walker, and shower chair; pt says he does not have HH services, or home oxygen; awaiting PT eval; TOC is following.  Expected Discharge Plan: Home/Self Care Barriers to Discharge: Continued Medical Work up   Patient Goals and CMS Choice Patient states their goals for this hospitalization and ongoing recovery are:: home CMS Medicare.gov Compare Post Acute Care list provided to:: Patient   [place] ownership interest in [facility].provided to:: Patient    Expected Discharge Plan and Services   Discharge Planning Services: CM Consult   Living arrangements for the past 2 months: Single Family Home                                      Prior Living Arrangements/Services Living arrangements for the past 2 months: Single Family Home Lives with:: Spouse Patient language and need for interpreter reviewed:: Yes Do you feel safe going back to the place where you live?: Yes      Need for Family Participation in Patient Care: Yes (Comment) Care giver support system in place?: Yes (comment) Current home services: DME (cane, shower chair) Criminal Activity/Legal Involvement Pertinent to Current Situation/Hospitalization: No - Comment as needed  Activities of Daily Living   ADL Screening (condition at time of admission) Independently performs ADLs?: Yes (appropriate for developmental age) Is the patient deaf or have  difficulty hearing?: No Does the patient have difficulty seeing, even when wearing glasses/contacts?: No Does the patient have difficulty concentrating, remembering, or making decisions?: No  Permission Sought/Granted Permission sought to share information with : Case Manager Permission granted to share information with : Yes, Verbal Permission Granted  Share Information with NAME: Case Manager     Permission granted to share info w Relationship: Henry Utsey (spouse) [phone]     Emotional Assessment Appearance:: Appears stated age Attitude/Demeanor/Rapport: Gracious Affect (typically observed): Accepting Orientation: : Oriented to Self, Oriented to Place, Oriented to  Time, Oriented to Situation Alcohol / Substance Use: Not Applicable Psych Involvement: No (comment)  Admission diagnosis:  AKI (acute kidney injury) (HCC) [N17.9] Syncope, unspecified syncope type [R55] Patient Active Problem List   Diagnosis Date Noted   Dehydration 04/23/2024   History of stroke 04/23/2024   Hypokalemia 04/23/2024   Prolonged QT interval 04/23/2024   OSA (obstructive sleep apnea) 04/23/2024   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 04/04/2021   Renal failure (ARF), acute on chronic (HCC) 04/03/2021   Perirectal abscess    Sepsis with acute renal failure without septic shock (HCC)    Fournier's gangrene 03/31/2021   Cellulitis of perineum 03/31/2021   AKI (acute kidney injury) (HCC) 03/31/2021   Malignant neoplasm of prostate (HCC) 10/04/2020   S/P total knee replacement 01/22/2017   Cryptogenic stroke (HCC) 08/03/2014  Gout 03/13/2014   Cerebral infarction (HCC) 03/12/2014   Fever 09/18/2012   DM (diabetes mellitus) (HCC) 09/18/2012   CKD (chronic kidney disease) 09/18/2012   Anemia 09/18/2012   HTN (hypertension) 09/18/2012   Hyperlipidemia 09/18/2012   Anemia associated with chronic renal failure 10/18/2011   PCP:  [facility]:   [id number] [place], [place] - [address] [place] [address] Phone: [phone] Fax: [phone]  [facility] - [place], [place] - [address] [place] [address] Phone: [phone] Fax: [phone]     Social Drivers of Health (SDOH) Social History: SDOH Screenings   Food Insecurity: No Food Insecurity (04/24/2024)  Housing: Low Risk  (04/24/2024)  Transportation Needs: No Transportation Needs (04/24/2024)  Utilities: Not At Risk (04/24/2024)  Social Connections: Unknown (04/03/2022)   Received from [facility]  Tobacco Use: Low Risk  (04/23/2024)   SDOH Interventions: Food Insecurity Interventions: Intervention Not Indicated, Inpatient TOC Housing Interventions: Intervention Not Indicated, Inpatient TOC Transportation Interventions: Intervention Not Indicated, Inpatient TOC Utilities Interventions: Intervention Not Indicated, Inpatient TOC   Readmission Risk Interventions    04/24/2024    4:29 PM  Readmission Risk Prevention Plan  Transportation Screening Complete  PCP or Specialist Appt within 3-5 Days Complete  HRI or Home Care Consult Complete  Social Work Consult for Recovery Care Planning/Counseling Complete  Palliative Care Screening Not Applicable  Medication Review Oceanographer) Complete

## 2024-04-24 NOTE — Plan of Care (Signed)
  Problem: Coping: Goal: Ability to adjust to condition or change in health will improve Outcome: Progressing   Problem: Fluid Volume: Goal: Ability to maintain a balanced intake and output will improve Outcome: Progressing   Problem: Metabolic: Goal: Ability to maintain appropriate glucose levels will improve Outcome: Progressing   Problem: Nutritional: Goal: Maintenance of adequate nutrition will improve Outcome: Progressing   Problem: Skin Integrity: Goal: Risk for impaired skin integrity will decrease Outcome: Progressing   Problem: Education: Goal: Knowledge of General Education information will improve Description: Including pain rating scale, medication(s)/side effects and non-pharmacologic comfort measures Outcome: Progressing   Problem: Clinical Measurements: Goal: Diagnostic test results will improve Outcome: Progressing   Problem: Activity: Goal: Risk for activity intolerance will decrease Outcome: Progressing   Problem: Safety: Goal: Ability to remain free from injury will improve Outcome: Progressing

## 2024-04-24 NOTE — Progress Notes (Signed)
  Echocardiogram 2D Echocardiogram has been performed.  Fain Home RDCS 04/24/2024, 3:25 PM

## 2024-04-24 NOTE — Evaluation (Addendum)
 Clinical/Bedside Swallow Evaluation Patient Details  Name: [name] MRN: [id number] Date of Birth: 09/19/1946  Today's Date: 04/24/2024 Time: SLP Start Time (ACUTE ONLY): 0940 SLP Stop Time (ACUTE ONLY): 1000 SLP Time Calculation (min) (ACUTE ONLY): 20 min  Past Medical History:  Past Medical History:  Diagnosis Date   Abrasion of face    11-15-2020  per pt stated has small area under one ear,  no drainage , no dressing, putting neosporin on it   Anemia associated with chronic renal failure    Anxiety    Arthritis    Chronic constipation    Chronic gout    per pt last episode approx. 2005  ([facility] )   Chronic pruritic rash in adult    intermittant on back   CKD (chronic kidney disease), stage III (HCC)    [facility]   Coronary artery disease    Diverticulosis of colon    DOE (dyspnea on exertion)    long distance walking and up flight of stairs but states ok after rest couple minutes   Dyspnea    History of agent Orange exposure    History of basal cell carcinoma excision    back, stomach, face   History of CVA (cerebrovascular accident) without residual deficits 03-12-2014 in epic   acute/ subacute thalamic infarct, thrombotic secondary to severe cerebral vascular disease   History of DVT of lower extremity    History of kidney stones    Hyperlipidemia    Hyperplasia of prostate with lower urinary tract symptoms (LUTS)    Hypertension    follow[facility]    OSA on CPAP    severe per study in epic 08-31-2015,  states uses nightly   Peripheral neuropathy    Prostate cancer [facility]) urologist--- dr Secundino Dach;  oncologist--- dr Lorri Rota   first dx 02/ 2014 active survillance;  repeat bx 08-29-2020 ,  Stage T1c, Gleason 3+4   Stroke [facility])    Type 2 diabetes mellitus treated with insulin  (HCC)    [facility]   (11-15-2020  per pt check blood sugar twice daily,  fasting sugar --- 110--120)   Wears glasses    Wears hearing aid in both ears     HOH even when he wears aids   Past Surgical History:  Past Surgical History:  Procedure Laterality Date   APPENDECTOMY  age 17   BLEPHAROPLASTY Bilateral 2015   upper eyelid   CYSTOSCOPY N/A 11/16/2020   Procedure: CYSTOSCOPY FLEXIBLE;  Surgeon: Osborn Blaze, MD;  Location: [facility];  Service: Urology;  Laterality: N/A;  No seeds detected in bladder per Dr. Secundino Dach   CYSTOSCOPY/RETROGRADE/URETEROSCOPY/STONE EXTRACTION WITH BASKET  2002   IRRIGATION AND DEBRIDEMENT ABSCESS N/A 04/01/2021   Procedure: EXAM  UNDER ANESTHESIA; ANOSCOPY; DRAINAGE OF PERIRECTAL ABSCESS; PLACEMENT OF SETON;  Surgeon: Caralyn Chandler, MD;  Location: [facility];  Service: General;  Laterality: N/A;   MANDIBLE RECONSTRUCTION Left 2014   w/ cadaver bone   MOHS SURGERY  2012   nose   PILONIDAL CYST EXCISION  05/2020   RADIOACTIVE SEED IMPLANT N/A 11/16/2020   Procedure: RADIOACTIVE SEED IMPLANT/BRACHYTHERAPY IMPLANT;  Surgeon: Osborn Blaze, MD;  Location[facility];  Service: Urology;  Laterality: N/A;  90 MINS   SPACE OAR INSTILLATION N/A 11/16/2020   Procedure: SPACE OAR INSTILLATION;  Surgeon: Osborn Blaze, MD;  [facility];  Service: Urology;  Laterality: N/A;  TOTAL HIP ARTHROPLASTY Right 10-26-2003  @[facility]    TOTAL KNEE ARTHROPLASTY Right 01/22/2017   Procedure: RIGHT TOTAL KNEE ARTHROPLASTY;  Surgeon: Claiborne Crew, MD;  Location: [facility];  Service: Orthopedics;  Laterality: Right;   HPI:  Patient is a 78 year old male admitted to [facility] after fall 2 sequential days.  Past medical history significant for agent orange exposure, prostate cancer status post radioactive beads placement,  Fournier's gangrene, CAD history of CVA and DVT, hypertension and OSA.  Patient found to have acute kidney injury and dehydration.  Swallow evaluation ordered by admitting MD    Assessment / Plan / Recommendation  Clinical Impression  Patient presents  with functional oropharyngeal swallow ability based on clinical swallow evaluation.  He easily passed a 3 ounce Yale swallow challenge.  He does appear with slight left facial asymmetry but also has history of Mohs surgery and mandibular cadaver bone replacement.  Does endorse some history of reflux issues for which he takes Tums at home.  He admits to being on a reflux medication for 2 months approximately 6 months ago but states that it did not help so he stopped it.  Patient denies issues with swallowing except for occasionally getting "choked" on large pills.  On occasion when he has expectorated the pill it was accompanied by frothy secretions causing SLP to suspect potential esophageal retention.  Patient reports occasionally sensing pills sticking in his throat pointing distally.  Discussed with pt alternative administrations to take medications that may be easier for him.  He questions if he has had some worsening reflux symptoms since being placed on a medication to help him lose weight.  Note distal thickening and small hiatal hernia on abdomen CT.  Recommended he speak to MD about his reflux symptoms.  Patient denies dysphagia associated with prior strokes in 2015 does admit to occasional word finding difficulties. SLP Visit Diagnosis: Dysphagia, unspecified (R13.10)    Aspiration Risk  No limitations    Diet Recommendation Regular;Thin liquid    Liquid Administration via: Cup;Straw Medication Administration: Whole meds with liquid Supervision: Patient able to self feed Compensations: Slow rate;Small sips/bites Postural Changes: Seated upright at 90 degrees;Remain upright for at least 30 minutes after po intake    Other  Recommendations Oral Care Recommendations: Oral care BID     Assistance Recommended at Discharge  na  Functional Status Assessment  na  Frequency and Duration   na         Prognosis        Swallow Study   General Date of Onset: 04/24/24 HPI: Patient is a  78 year old male admitted to [facility] after fall 2 sequential days.  Past medical history significant for agent orange exposure, prostate cancer status post radioactive beads placement,  Fournier's gangrene, CAD history of CVA and DVT, hypertension and OSA.  Patient found to have acute kidney injury and dehydration.  Swallow evaluation ordered by admitting MD Type of Study: Bedside Swallow Evaluation Previous Swallow Assessment: n/a Diet Prior to this Study: Regular;Thin liquids (Level 0) Respiratory Status: Room air History of Recent Intubation: No Behavior/Cognition: Alert;Cooperative;Pleasant mood Oral Cavity Assessment: Within Functional Limits Oral Care Completed by SLP: No Oral Cavity - Dentition: Adequate natural dentition Vision: Functional for self-feeding Self-Feeding Abilities: Able to feed self Patient Positioning: Upright in bed Baseline Vocal Quality: Normal Volitional Cough: Strong Volitional Swallow: Able to elicit    Oral/Motor/Sensory Function Overall Oral Motor/Sensory Function: Mild impairment Facial Symmetry: Abnormal symmetry left (Patient also with history  of Mohs surgery) Facial Strength: Within Functional Limits Facial Sensation: Within Functional Limits Lingual ROM: Within Functional Limits Lingual Symmetry: Within Functional Limits Lingual Strength: Within Functional Limits Lingual Sensation: Within Functional Limits Velum: Within Functional Limits Mandible: Within Functional Limits   Ice Chips Ice chips: Not tested   Thin Liquid Thin Liquid: Within functional limits Presentation: Self Fed;Straw;Cup    Nectar Thick Nectar Thick Liquid: Not tested   Honey Thick Honey Thick Liquid: Not tested   Puree Puree: Not tested   Solid     Solid: Within functional limits Presentation: Self Fed;Spoon      Chantal Comment 04/24/2024,10:33 AM   Maudie Sorrow, MS [facility] SLP Acute Rehab Services Office [phone]

## 2024-04-24 NOTE — Progress Notes (Signed)
   04/24/24 2009  BiPAP/CPAP/SIPAP  BiPAP/CPAP/SIPAP Pt Type Adult  BiPAP/CPAP/SIPAP DREAMSTATIOND  Mask Type Full face mask  Dentures removed? Not applicable  Mask Size Medium  EPAP 7 cmH2O  FiO2 (%) 21 %  Patient Home Machine No  Patient Home Mask No  Patient Home Tubing No  Auto Titrate No  CPAP/SIPAP surface wiped down Yes  Device Plugged into RED Power Outlet Yes  BiPAP/CPAP /SiPAP Vitals  Pulse Rate 77  Resp 18  SpO2 96 %  Bilateral Breath Sounds Clear;Diminished  MEWS Score/Color  MEWS Score 0  MEWS Score Color Marrie Sizer

## 2024-04-25 DIAGNOSIS — G4733 Obstructive sleep apnea (adult) (pediatric): Secondary | ICD-10-CM | POA: Diagnosis not present

## 2024-04-25 DIAGNOSIS — I10 Essential (primary) hypertension: Secondary | ICD-10-CM | POA: Diagnosis not present

## 2024-04-25 DIAGNOSIS — N1831 Chronic kidney disease, stage 3a: Secondary | ICD-10-CM | POA: Diagnosis not present

## 2024-04-25 DIAGNOSIS — N179 Acute kidney failure, unspecified: Secondary | ICD-10-CM | POA: Diagnosis not present

## 2024-04-25 LAB — BASIC METABOLIC PANEL WITH GFR
Anion gap: 7 (ref 5–15)
BUN: 28 mg/dL — ABNORMAL HIGH (ref 8–23)
CO2: 20 mmol/L — ABNORMAL LOW (ref 22–32)
Calcium: 8.2 mg/dL — ABNORMAL LOW (ref 8.9–10.3)
Chloride: 114 mmol/L — ABNORMAL HIGH (ref 98–111)
Creatinine, Ser: 1.61 mg/dL — ABNORMAL HIGH (ref 0.61–1.24)
GFR, Estimated: 44 mL/min — ABNORMAL LOW (ref >60)
Glucose, Bld: 103 mg/dL — ABNORMAL HIGH (ref 70–99)
Potassium: 4 mmol/L (ref 3.5–5.1)
Sodium: 141 mmol/L (ref 135–145)

## 2024-04-25 LAB — GLUCOSE, CAPILLARY
Glucose-Capillary: 104 mg/dL — ABNORMAL HIGH (ref 70–99)
Glucose-Capillary: 109 mg/dL — ABNORMAL HIGH (ref 70–99)
Glucose-Capillary: 120 mg/dL — ABNORMAL HIGH (ref 70–99)
Glucose-Capillary: 123 mg/dL — ABNORMAL HIGH (ref 70–99)
Glucose-Capillary: 149 mg/dL — ABNORMAL HIGH (ref 70–99)
Glucose-Capillary: 98 mg/dL (ref 70–99)

## 2024-04-25 MED ORDER — AMLODIPINE BESYLATE 10 MG PO TABS
10.0000 mg | ORAL_TABLET | Freq: Every day | ORAL | Status: DC
  Administered 2024-04-25 – 2024-04-26 (×2): 10 mg via ORAL
  Filled 2024-04-25 (×2): qty 1

## 2024-04-25 NOTE — [place] FL2 (Signed)
 [place]  MEDICAID FL2 LEVEL OF CARE FORM     IDENTIFICATION  Patient Name: [name] Birthdate: December 15, 1945 Sex: male Admission Date (Current Location): 04/23/2024  [facility] and [place] Number:  Producer, television/film/video and Address:  [facility],  [address] [place], [place] [address]      Provider Number: [id number]  Attending Physician Name and Address:  Verlyn Goad, MD  Relative Name and Phone Number:  Arafat Cocuzza (spouse) [phone]    Current Level of Care: Hospital Recommended Level of Care: Skilled Nursing Facility Prior Approval Number:    Date Approved/Denied:   PASRR Number: [id number]  Discharge Plan: SNF    Current Diagnoses: Patient Active Problem List   Diagnosis Date Noted   Dehydration 04/23/2024   History of stroke 04/23/2024   Hypokalemia 04/23/2024   Prolonged QT interval 04/23/2024   OSA (obstructive sleep apnea) 04/23/2024   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 04/04/2021   Renal failure (ARF), acute on chronic (HCC) 04/03/2021   Perirectal abscess    Sepsis with acute renal failure without septic shock (HCC)    Fournier's gangrene 03/31/2021   Cellulitis of perineum 03/31/2021   AKI (acute kidney injury) (HCC) 03/31/2021   Malignant neoplasm of prostate (HCC) 10/04/2020   S/P total knee replacement 01/22/2017   Cryptogenic stroke (HCC) 08/03/2014   Gout 03/13/2014   Cerebral infarction (HCC) 03/12/2014   Fever 09/18/2012   DM (diabetes mellitus) (HCC) 09/18/2012   CKD (chronic kidney disease) 09/18/2012   Anemia 09/18/2012   HTN (hypertension) 09/18/2012   Hyperlipidemia 09/18/2012   Anemia associated with chronic renal failure 10/18/2011    Orientation RESPIRATION BLADDER Height & Weight     Self, Time, Situation  Normal Continent Weight: 102.5 kg Height:  5\' 10"  (177.8 cm)  BEHAVIORAL SYMPTOMS/MOOD NEUROLOGICAL BOWEL NUTRITION STATUS      Continent Diet (carb modified)  AMBULATORY STATUS COMMUNICATION OF  NEEDS Skin   Limited Assist Verbally Other (Comment), Skin abrasions, Bruising (erythema to buttocks; ecchymosis bilateral arms and knees; abrasions to right arm/knee)                       Personal Care Assistance Level of Assistance  Bathing, Feeding, Dressing Bathing Assistance: Limited assistance Feeding assistance: Independent Dressing Assistance: Limited assistance     Functional Limitations Info  Sight, Hearing, Speech Sight Info: Impaired Hearing Info: Impaired Speech Info: Adequate    SPECIAL CARE FACTORS FREQUENCY  PT (By licensed PT), OT (By licensed OT)     PT Frequency: 5x/week OT Frequency: 5x/week            Contractures Contractures Info: Not present    Additional Factors Info  Code Status Code Status Info: Full Code             Current Medications (04/25/2024):  This is the current hospital active medication list Current Facility-Administered Medications  Medication Dose Route Frequency Provider Last Rate Last Admin   acetaminophen  (TYLENOL ) tablet 650 mg  650 mg Oral Q6H PRN Doutova, Anastassia, MD       Or   acetaminophen  (TYLENOL ) suppository 650 mg  650 mg Rectal Q6H PRN Doutova, Anastassia, MD       amitriptyline  (ELAVIL ) tablet 50 mg  50 mg Oral QHS Doutova, Anastassia, MD   50 mg at 04/24/24 2107   amLODipine  (NORVASC ) tablet 10 mg  10 mg Oral QHS Verlyn Goad, MD       buPROPion  (WELLBUTRIN  XL)  24 hr tablet 150 mg  150 mg Oral Daily Doutova, Anastassia, MD   150 mg at 04/25/24 1610   carvedilol  (COREG ) tablet 12.5 mg  12.5 mg Oral BID WC Verlyn Goad, MD   12.5 mg at 04/25/24 9604   gabapentin  (NEURONTIN ) capsule 600 mg  600 mg Oral QHS Doutova, Anastassia, MD   600 mg at 04/24/24 2107   HYDROcodone -acetaminophen  (NORCO/VICODIN) 5-325 MG per tablet 1-2 tablet  1-2 tablet Oral Q4H PRN Doutova, Anastassia, MD   1 tablet at 04/23/24 2226   insulin  aspart (novoLOG ) injection 0-9 Units  0-9 Units Subcutaneous Q4H Doutova, Anastassia, MD    1 Units at 04/25/24 1220   insulin  glargine-yfgn (SEMGLEE ) injection 10 Units  10 Units Subcutaneous QHS Doutova, Anastassia, MD   10 Units at 04/24/24 2107   lactated ringers  infusion   Intravenous Continuous Doutova, Anastassia, MD 100 mL/hr at 04/25/24 1549 Infusion Verify at 04/25/24 1549   multivitamin with minerals tablet 1 tablet  1 tablet Oral Daily Verlyn Goad, MD   1 tablet at 04/25/24 5409   protein supplement (ENSURE MAX) liquid  11 oz Oral BID Verlyn Goad, MD   11 oz at 04/24/24 2107   tamsulosin  (FLOMAX ) capsule 0.4 mg  0.4 mg Oral QPC supper Doutova, Anastassia, MD   0.4 mg at 04/24/24 1640     Discharge Medications: Please see discharge summary for a list of discharge medications.  Relevant Imaging Results:  Relevant Lab Results:   Additional Information SSN [id number]  Levie Ream, RN

## 2024-04-25 NOTE — Evaluation (Signed)
 Occupational Therapy Evaluation Patient Details Name: [name] MRN: [id number] DOB: 1946/04/13 Today's Date: 04/25/2024   History of Present Illness   [name] is a 78 y.o. male presents after a fall. CT head  No acute intracranial abnormality, No skull fracture. CT cervical No acute fracture or subluxation of the cervical spine. L knee xray No acute fracture. PMH: CKD, prostate cancer, DM2, agent orange exposure, with radioactive bead placement and multiple associated surgical complications requiring anal drain, Fournier's gangrene  CAD, hx of CVA, hx of DVT, HLD, HTN, OSA     Clinical Impressions Patient is a 78 year old male who was admitted for above. Patient was living at home with some A from wife at times for LB Adls. Currently, patients L knee unsteady with engagement in time on feet with need to use RW. Patient typically only needs cane for community outings. Patient was noted to have decreased functional activity tolerance, decreased endurance, decreased standing balance, decreased safety awareness, and decreased knowledge of AD/AE impacting participation in ADLs. Patient will benefit from continued inpatient follow up therapy, <3 hours/day.      If plan is discharge home, recommend the following:   A lot of help with bathing/dressing/bathroom;Assistance with cooking/housework;Direct supervision/assist for medications management;Assist for transportation;Help with stairs or ramp for entrance;Direct supervision/assist for financial management;A little help with walking and/or transfers     Functional Status Assessment   Patient has had a recent decline in their functional status and demonstrates the ability to make significant improvements in function in a reasonable and predictable amount of time.     Equipment Recommendations   None recommended by OT      Precautions/Restrictions   Precautions Precautions: Fall Restrictions Weight Bearing Restrictions Per  Provider Order: No     Mobility Bed Mobility Overal bed mobility: Needs Assistance Bed Mobility: Supine to Sit     Supine to sit: Min assist               Balance Overall balance assessment: Needs assistance Sitting-balance support: Feet supported Sitting balance-Leahy Scale: Good     Standing balance support: Reliant on assistive device for balance, During functional activity, Bilateral upper extremity supported Standing balance-Leahy Scale: Poor           ADL either performed or assessed with clinical judgement   ADL Overall ADL's : Needs assistance/impaired Eating/Feeding: Sitting;Supervision/ safety   Grooming: Sitting;Oral care;Wash/dry face;Brushing hair;Set up Grooming Details (indicate cue type and reason): in recliner Upper Body Bathing: Sitting;Set up   Lower Body Bathing: Sitting/lateral leans;Maximal assistance Lower Body Bathing Details (indicate cue type and reason): reported having help with this at home some. Upper Body Dressing : Sitting;Minimal assistance   Lower Body Dressing: Sitting/lateral leans;Maximal assistance   Toilet Transfer: Ambulation;Rolling walker (2 wheels);Minimal assistance Toilet Transfer Details (indicate cue type and reason): from L side of the bed to R side where recliner is with increased time. no buckling of LLE but noted to have some poor safety awareness with education on proper placement of hands for transition. Toileting- Clothing Manipulation and Hygiene: Sitting/lateral lean;Maximal assistance               Vision Baseline Vision/History: 1 Wears glasses              Pertinent Vitals/Pain Pain Assessment Pain Assessment: Faces Faces Pain Scale: Hurts even more Pain Location: L knee with weight bearing Pain Descriptors / Indicators: Sore Pain Intervention(s): Limited activity within patient's tolerance, Monitored during  session, Repositioned     Extremity/Trunk Assessment Upper Extremity  Assessment Upper Extremity Assessment: Overall WFL for tasks assessed   Lower Extremity Assessment Lower Extremity Assessment: Defer to PT evaluation   Cervical / Trunk Assessment Cervical / Trunk Assessment: Kyphotic   Communication Communication Communication: Impaired Factors Affecting Communication: Hearing impaired   Cognition Arousal: Alert Behavior During Therapy: WFL for tasks assessed/performed               OT - Cognition Comments: patients wife was present in room. patient HOH at times during session. plesant and cooperative.                                    Home Living Family/patient expects to be discharged to:: Private residence Living Arrangements: Spouse/significant other Available Help at Discharge: Family;Available 24 hours/day Type of Home: House Home Access: Stairs to enter [employer] of Steps: 4 steps with no handrails at backdoor (primary entrance) and 5 steps with handrails at front door         Bathroom Shower/Tub: Tub/shower unit         Home Equipment: Agricultural consultant (2 wheels);Cane - single point;Shower seat;Grab bars - tub/shower;Rollator (4 wheels)   Additional Comments: pt reports son comes over daily to assist with household chores, son completes grocery shopping      Prior Functioning/Environment Prior Level of Function : Needs assist             Mobility Comments: pt reports using SPC in the home and RW outside ADLs Comments: pt reports spouse assists with LB dressing at times, ind with toileting and showering, spouse completes household chores    OT Problem List: Impaired balance (sitting and/or standing);Decreased knowledge of precautions;Decreased safety awareness;Decreased activity tolerance;Decreased knowledge of use of DME or AE   OT Treatment/Interventions: Self-care/ADL training;DME and/or AE instruction;Therapeutic activities;Balance training;Energy conservation;Patient/family  education;Therapeutic exercise      OT Goals(Current goals can be found in the care plan section)   Acute Rehab OT Goals Patient Stated Goal: to get knees better OT Goal Formulation: With patient/family Time For Goal Achievement: 05/09/24 Potential to Achieve Goals: Fair   OT Frequency:  Min 2X/week       AM-PAC OT "6 Clicks" Daily Activity     Outcome Measure Help from another person eating meals?: None Help from another person taking care of personal grooming?: A Little Help from another person toileting, which includes using toliet, bedpan, or urinal?: A Lot Help from another person bathing (including washing, rinsing, drying)?: A Lot Help from another person to put on and taking off regular upper body clothing?: A Little Help from another person to put on and taking off regular lower body clothing?: A Lot 6 Click Score: 16   End of Session Equipment Utilized During Treatment: Gait belt;Rolling walker (2 wheels)  Activity Tolerance: Patient tolerated treatment well Patient left: in chair;with call bell/phone within reach;with chair alarm set;with family/visitor present  OT Visit Diagnosis: Unsteadiness on feet (R26.81);Other abnormalities of gait and mobility (R26.89)                Time: 1610-9604 OT Time Calculation (min): 25 min Charges:  OT General Charges $OT Visit: 1 Visit OT Evaluation $OT Eval Low Complexity: 1 Low OT Treatments $Self Care/Home Management : 8-22 mins  Wynette Heckler, MS Acute Rehabilitation Department Office# [phone]   Jame Maze 04/25/2024, 12:38 PM

## 2024-04-25 NOTE — TOC Progression Note (Addendum)
 Transition of Care [facility]) - Progression Note    Patient Details  Name: [name] MRN: [id number] Date of Birth: [birth year]  [facility] CM/SW Contact  Levie Ream, RN Phone Number: 04/25/2024, 12:33 PM  Clinical Narrative:    PT recc SNF; spoke w/ pt and wife [name] in room; they would like for PT to see pt again today before they make decision for SNF vs HH services; Dr Duard Getting, and Dorrie Gaudier, PT notified via secure chat; [facility] will follow up w/ pt after seen by PT.  -1523- spoke w/ pt and wife in room; they would like for pt to d/c to SNF; their facility choice is [facility]; explained [facility] auth process; they verbalized understanding; Dr Duard Getting notified via secure chat.  -1636- FL2 sent for co-sign; will pass on to oncoming TOC to fax documents for [facility] when FL2 signed.  Expected Discharge Plan: Home/Self Care Barriers to Discharge: Continued Medical Work up  Expected Discharge Plan and Services   Discharge Planning Services: CM Consult   Living arrangements for the past 2 months: Single Family Home                                       Social Determinants of Health (SDOH) Interventions SDOH Screenings   Food Insecurity: No Food Insecurity (04/24/2024)  Housing: Low Risk  (04/24/2024)  Transportation Needs: No Transportation Needs (04/24/2024)  Utilities: Not At Risk (04/24/2024)  Social Connections: Unknown (04/03/2022)   Received from [facility]  Tobacco Use: Low Risk  (04/23/2024)    Readmission Risk Interventions    04/24/2024    4:29 PM  Readmission Risk Prevention Plan  Transportation Screening Complete  PCP or Specialist Appt within 3-5 Days Complete  HRI or Home Care Consult Complete  Social Work Consult for Recovery Care Planning/Counseling Complete  Palliative Care Screening Not Applicable  Medication Review Oceanographer) Complete

## 2024-04-25 NOTE — Plan of Care (Signed)
  Problem: Education: Goal: Ability to describe self-care measures that may prevent or decrease complications (Diabetes Survival Skills Education) will improve Outcome: Progressing   Problem: Coping: Goal: Ability to adjust to condition or change in health will improve Outcome: Progressing   Problem: Fluid Volume: Goal: Ability to maintain a balanced intake and output will improve Outcome: Progressing   Problem: Metabolic: Goal: Ability to maintain appropriate glucose levels will improve Outcome: Progressing   Problem: Nutritional: Goal: Maintenance of adequate nutrition will improve Outcome: Progressing Goal: Progress toward achieving an optimal weight will improve Outcome: Progressing   Problem: Education: Goal: Knowledge of General Education information will improve Description: Including pain rating scale, medication(s)/side effects and non-pharmacologic comfort measures Outcome: Progressing   Problem: Clinical Measurements: Goal: Diagnostic test results will improve Outcome: Progressing Goal: Cardiovascular complication will be avoided Outcome: Progressing   Problem: Activity: Goal: Risk for activity intolerance will decrease Outcome: Progressing   Problem: Pain Managment: Goal: General experience of comfort will improve and/or be controlled Outcome: Progressing

## 2024-04-25 NOTE — Progress Notes (Signed)
   04/25/24 2338  BiPAP/CPAP/SIPAP  BiPAP/CPAP/SIPAP Pt Type Adult  BiPAP/CPAP/SIPAP DREAMSTATIOND  Mask Type Full face mask  Mask Size Medium  EPAP 7 cmH2O  FiO2 (%) 21 %  Patient Home Machine No  Patient Home Mask No  Patient Home Tubing No  Auto Titrate No  CPAP/SIPAP surface wiped down Yes  Device Plugged into [employer] Yes

## 2024-04-25 NOTE — Progress Notes (Signed)
 PROGRESS NOTE    [name]  ZOX:[id number] DOB: 05-12-1946 DOA: 04/23/2024 PCP: [facility]   Brief Narrative: [name] is a 78 y.o. male with a history of CKD, CAD, diabetes mellitus, prostate cancer, agent orange exposure, transsphincteric anal fistula s/p drain, CVA, DVT, hyperlipidemia, hypertension, OSA.  Patient presented secondary to a fall with weakness and found to have evidence of AKI. IV fluids started with improvement in renal function.   Assessment and Plan:  AKI on CKD stage 3a Baseline creatinine of about 1.6 from earlier this year. Creatinine of 3.73 on admission. IV fluids started. Creatinine improved 1.61. AKI resolved.  Presyncope Patient with lightheadedness and fall. CT imaging without acute intracranial process. Transthoracic Echocardiogram ordered and is significant for a normal LVEF of 55-60% with grade 1 diastolic dysfunction and no evidence of aortic valve stenosis.  Left knee pain Secondary to fall. Knee x-ray without evidence of fracture.  Diabetes mellitus type 2 Well controlled for age. -Continue Semglee  and SSI  Chronic anemia stable  Primary hypertension Patient is on amlodipine  and Coreg  -Continue Coreg  and restart home amlodipine   Hyperlipidemia Patient is on Lipitor as an outpatient -Continue Lipitor  History of stroke Noted. Patient is on aspirin  as an outpatient.  Hypokalemia -Potassium supplementation  OSA -Continue CPAP at bedtime   Prolonged QTc Noted.   DVT prophylaxis: SCDs Code Status:   Code Status: Full Code Family Communication: None at bedside Disposition Plan: Discharge home pending SNF bed   Consultants:  None  Procedures:  None  Antimicrobials: None    Subjective: No issues this morning. Feels well.  Objective: BP (!) 158/66 (BP Location: Right Arm)   Pulse 70   Temp 97.9 F (36.6 C)   Resp 18   Ht 5\' 10"  (1.778 m)   Wt 102.5 kg   SpO2 97%   BMI 32.42 kg/m    Examination:  General exam: Appears calm and comfortable Respiratory system: Clear to auscultation. Respiratory effort normal. Cardiovascular system: S1 & S2 heard, RRR. Gastrointestinal system: Abdomen is nondistended, soft and nontender. Normal bowel sounds heard. Central nervous system: Alert and oriented. No focal neurological deficits. Musculoskeletal: No edema. No calf tenderness Psychiatry: Judgement and insight appear normal. Mood & affect appropriate.    Data Reviewed: I have personally reviewed following labs and imaging studies  CBC Lab Results  Component Value Date   WBC 7.3 04/24/2024   RBC 3.28 (L) 04/24/2024   HGB 9.1 (L) 04/24/2024   HCT 29.4 (L) 04/24/2024   MCV 89.6 04/24/2024   MCH 27.7 04/24/2024   PLT 158 04/24/2024   MCHC 31.0 04/24/2024   RDW 13.2 04/24/2024   LYMPHSABS 1.8 04/23/2024   MONOABS 0.7 04/23/2024   EOSABS 0.3 04/23/2024   BASOSABS 0.0 04/23/2024     Last metabolic panel Lab Results  Component Value Date   NA 141 04/25/2024   K 4.0 04/25/2024   CL 114 (H) 04/25/2024   CO2 20 (L) 04/25/2024   BUN 28 (H) 04/25/2024   CREATININE 1.61 (H) 04/25/2024   GLUCOSE 103 (H) 04/25/2024   GFRNONAA 44 (L) 04/25/2024   GFRAA 54 (L) 01/23/2017   CALCIUM  8.2 (L) 04/25/2024   PHOS 3.1 04/24/2024   PROT 6.1 (L) 04/24/2024   ALBUMIN 3.0 (L) 04/24/2024   BILITOT 0.4 04/24/2024   ALKPHOS 103 04/24/2024   AST 22 04/24/2024   ALT 14 04/24/2024   ANIONGAP 7 04/25/2024    GFR: Estimated Creatinine Clearance: 45.4 mL/min (A) (  by C-G formula based on SCr of 1.61 mg/dL (H)).  No results found for this or any previous visit (from the past 240 hours).    Radiology Studies: ECHOCARDIOGRAM COMPLETE Result Date: 04/24/2024    ECHOCARDIOGRAM REPORT   Patient Name:   [name] Date of Exam: 04/24/2024 Medical Rec #:  [id number]     Height:       70.0 in Accession #:    [id number]    Weight:       226.0 lb Date of Birth:  11-22-1945     BSA:           2.198 m Patient Age:    77 years      BP:           125/69 mmHg Patient Gender: M             HR:           79 bpm. Exam Location:  Inpatient Procedure: 2D Echo (Both Spectral and Color Flow Doppler were utilized during            procedure). Indications:    syncope  History:        Patient has no prior history of Echocardiogram examinations.                 Signs/Symptoms:Syncope.  Sonographer:    Hersey Lorenzo Referring Phys: [id number] ANASTASSIA DOUTOVA IMPRESSIONS  1. Left ventricular ejection fraction, by estimation, is 55 to 60%. The left ventricle has normal function. The left ventricle has no regional wall motion abnormalities. There is mild left ventricular hypertrophy. Left ventricular diastolic parameters are consistent with Grade I diastolic dysfunction (impaired relaxation).  2. Right ventricular systolic function is normal. The right ventricular size is normal.  3. Left atrial size was moderately dilated.  4. The mitral valve is abnormal. Trivial mitral valve regurgitation. No evidence of mitral stenosis.  5. The aortic valve was not well visualized. There is mild calcification of the aortic valve. There is mild thickening of the aortic valve. Aortic valve regurgitation is not visualized. Aortic valve sclerosis is present, with no evidence of aortic valve  stenosis.  6. The inferior vena cava is normal in size with greater than 50% respiratory variability, suggesting right atrial pressure of 3 mmHg. FINDINGS  Left Ventricle: Left ventricular ejection fraction, by estimation, is 55 to 60%. The left ventricle has normal function. The left ventricle has no regional wall motion abnormalities. Strain was performed and the global longitudinal strain is indeterminate. The left ventricular internal cavity size was normal in size. There is mild left ventricular hypertrophy. Left ventricular diastolic parameters are consistent with Grade I diastolic dysfunction (impaired relaxation). Right Ventricle: The right  ventricular size is normal. No increase in right ventricular wall thickness. Right ventricular systolic function is normal. Left Atrium: Left atrial size was moderately dilated. Right Atrium: Right atrial size was normal in size. Pericardium: There is no evidence of pericardial effusion. Mitral Valve: The mitral valve is abnormal. There is mild thickening of the mitral valve leaflet(s). There is mild calcification of the mitral valve leaflet(s). Mild mitral annular calcification. Trivial mitral valve regurgitation. No evidence of mitral valve stenosis. Tricuspid Valve: The tricuspid valve is normal in structure. Tricuspid valve regurgitation is not demonstrated. No evidence of tricuspid stenosis. Aortic Valve: The aortic valve was not well visualized. There is mild calcification of the aortic valve. There is mild thickening of the aortic valve. Aortic valve regurgitation is not visualized.  Aortic valve sclerosis is present, with no evidence of aortic valve stenosis. Pulmonic Valve: The pulmonic valve was normal in structure. Pulmonic valve regurgitation is not visualized. No evidence of pulmonic stenosis. Aorta: The aortic root is normal in size and structure. Venous: The inferior vena cava is normal in size with greater than 50% respiratory variability, suggesting right atrial pressure of 3 mmHg. IAS/Shunts: No atrial level shunt detected by color flow Doppler. Additional Comments: 3D was performed not requiring image post processing on an independent workstation and was indeterminate.  LEFT VENTRICLE PLAX 2D LVIDd:         5.00 cm      Diastology LVIDs:         3.90 cm      LV e' medial:    5.00 cm/s LV PW:         1.10 cm      LV E/e' medial:  19.0 LV IVS:        1.10 cm      LV e' lateral:   8.49 cm/s LVOT diam:     2.30 cm      LV E/e' lateral: 11.2 LV SV:         95 LV SV Index:   43 LVOT Area:     4.15 cm  LV Volumes (MOD) LV vol d, MOD A4C: 123.0 ml LV vol s, MOD A4C: 59.2 ml LV SV MOD A4C:     123.0 ml  RIGHT VENTRICLE             IVC RV S prime:     11.20 cm/s  IVC diam: 1.50 cm TAPSE (M-mode): 2.7 cm LEFT ATRIUM             Index LA diam:        4.30 cm 1.96 cm/m LA Vol (A2C):   55.8 ml 25.38 ml/m LA Vol (A4C):   56.7 ml 25.79 ml/m LA Biplane Vol: 59.4 ml 27.02 ml/m  AORTIC VALVE LVOT Vmax:   111.00 cm/s LVOT Vmean:  78.000 cm/s LVOT VTI:    0.228 m  AORTA Ao Root diam: 3.50 cm Ao Asc diam:  3.10 cm MITRAL VALVE MV Area (PHT): 2.75 cm     SHUNTS MV Decel Time: 276 msec     Systemic VTI:  0.23 m MV E velocity: 94.80 cm/s   Systemic Diam: 2.30 cm MV A velocity: 117.00 cm/s MV E/A ratio:  0.81 Janelle Mediate MD Electronically signed by Janelle Mediate MD Signature Date/Time: 04/24/2024/5:27:31 PM    Final    CT HEAD WO CONTRAST ( ) Result Date: 04/23/2024 CLINICAL DATA:  Head trauma. Fall yesterday and again today. Dizziness before fall. EXAM: CT HEAD WITHOUT CONTRAST TECHNIQUE: Contiguous axial images were obtained from the base of the skull through the vertex without intravenous contrast. RADIATION DOSE REDUCTION: This exam was performed according to the departmental dose-optimization program which includes automated exposure control, adjustment of the mA and/or kV according to patient size and/or use of iterative reconstruction technique. COMPARISON:  None Available. FINDINGS: Brain: No intracranial hemorrhage, mass effect, or midline shift. Age related atrophy. No hydrocephalus. The basilar cisterns are patent. Mild periventricular and deep white matter hypodensity typical of chronic small vessel ischemia. Remote lacunar infarct in the left caudate no evidence of territorial infarct or acute ischemia. No extra-axial or intracranial fluid collection. Vascular: Atherosclerosis of skullbase vasculature without hyperdense vessel or abnormal calcification. Skull: No fracture or focal lesion. Sinuses/Orbits: Mucosal thickening throughout ethmoid air cells.  No sinus fluid levels. No mastoid effusion. Other: None  IMPRESSION: 1. No acute intracranial abnormality. No skull fracture. 2. Age related atrophy and chronic small vessel ischemia. Remote lacunar infarct in the left caudate. Electronically Signed   By: Chadwick Colonel M.D.   On: 04/23/2024 18:31   CT CHEST ABDOMEN PELVIS WO CONTRAST Result Date: 04/23/2024 CLINICAL DATA:  Trauma EXAM: CT CHEST, ABDOMEN AND PELVIS WITHOUT CONTRAST TECHNIQUE: Multidetector CT imaging of the chest, abdomen and pelvis was performed following the standard protocol without IV contrast. RADIATION DOSE REDUCTION: This exam was performed according to the departmental dose-optimization program which includes automated exposure control, adjustment of the mA and/or kV according to patient size and/or use of iterative reconstruction technique. COMPARISON:  CT abdomen and pelvis 03/31/2021. FINDINGS: CT CHEST FINDINGS Cardiovascular: No significant vascular findings. Normal heart size. No pericardial effusion. There are atherosclerotic calcifications of the aorta and coronary arteries. Mediastinum/Nodes: There is a posterior left thyroid nodule measuring 11 mm. Thyroid gland is otherwise within normal limits. No enlarged lymph nodes are seen. There is distal esophageal wall thickening and small hiatal hernia. Lungs/Pleura: There are atelectatic changes in the bilateral lower lobes. The lungs are otherwise clear. There is no pleural effusion or pneumothorax. Musculoskeletal: No acute fracture identified. CT ABDOMEN PELVIS FINDINGS Hepatobiliary: Gallstones are present. There is no biliary ductal dilatation. The liver is within normal limits. Pancreas: Unremarkable. No pancreatic ductal dilatation or surrounding inflammatory changes. Spleen: No splenic injury or perisplenic hematoma. Adrenals/Urinary Tract: No adrenal hemorrhage or renal injury identified. Bladder is unremarkable. Stomach/Bowel: Stomach is within normal limits. Appendix is not seen. No evidence of bowel wall thickening, distention,  or inflammatory changes. Sigmoid colon diverticulosis is present. Left perianal/perirectal fistula catheter present. No focal fluid collections are seen. Vascular/Lymphatic: Aortic atherosclerosis. No enlarged abdominal or pelvic lymph nodes. Reproductive: Prostate radiotherapy seeds are present. Other: Small fat containing umbilical hernia present. No ascites present. There is a 1.7 x 1.7 x 1.4 cm density in the left gluteal subcutaneous tissues, new from prior. Musculoskeletal: No acute fractures are seen. Right hip arthroplasty is present. IMPRESSION: 1. No acute posttraumatic sequelae in the chest, abdomen or pelvis. 2. Left perianal/perirectal catheter within a fistula. 3. 1.7 cm density in the left gluteal subcutaneous tissues, new from prior, possibly a hematoma. Enlarged lymph node is also in the differential. 4. Cholelithiasis. 5. Distal esophageal wall thickening and small hiatal hernia. 6. Aortic atherosclerosis. Aortic Atherosclerosis (ICD10-I70.0). Electronically Signed   By: Tyron Gallon M.D.   On: 04/23/2024 18:26   CT CERVICAL SPINE WO CONTRAST Result Date: 04/23/2024 CLINICAL DATA:  Fall yesterday and again today. Dizziness before fall. EXAM: CT CERVICAL SPINE WITHOUT CONTRAST TECHNIQUE: Multidetector CT imaging of the cervical spine was performed without intravenous contrast. Multiplanar CT image reconstructions were also generated. RADIATION DOSE REDUCTION: This exam was performed according to the departmental dose-optimization program which includes automated exposure control, adjustment of the mA and/or kV according to patient size and/or use of iterative reconstruction technique. COMPARISON:  None Available. FINDINGS: Alignment: Normal. Skull base and vertebrae: No acute fracture. Vertebral body heights are maintained. The dens and skull base are intact. Vague area of sclerosis involving the left aspect of C4 vertebra has no suspicious features. Soft tissues and spinal canal: No prevertebral  fluid or swelling. No visible canal hematoma. Disc levels: Mild diffuse anterior spurring. Moderate multilevel facet hypertrophy. Upper chest: Assessed on concurrent chest CT, reported separately Other: Carotid calcifications. IMPRESSION: 1. No acute fracture or subluxation  of the cervical spine. 2. Multilevel degenerative change. Electronically Signed   By: Chadwick Colonel M.D.   On: 04/23/2024 18:26   DG Knee Complete 4 Views Left Result Date: 04/23/2024 CLINICAL DATA:  Left knee pain following a fall yesterday. EXAM: LEFT KNEE - COMPLETE 4+ VIEW COMPARISON:  07/19/2010 FINDINGS: Tricompartmental degenerative changes with marked medial joint space narrowing with progression and stable marked patellofemoral joint space narrowing and extensive spur formation. Interval fragmentation of the superior spur. This is corticated without the appearance of an acute fracture. Moderate medial and lateral spur formation with corticated bony fragmentation medially. No acute fracture, dislocation or effusion. Atheromatous arterial calcifications. IMPRESSION: 1. No acute fracture. 2. Tricompartmental degenerative changes, marked in the medial and patellofemoral compartments. Electronically Signed   By: Catherin Closs M.D.   On: 04/23/2024 17:07      LOS: 2 days    Aneita Keens, MD [facility] 04/25/2024, 2:49 PM   If 7PM-7AM, please contact night-coverage [email or web address]

## 2024-04-25 NOTE — Progress Notes (Signed)
 Physical Therapy Treatment Patient Details Name: [name] MRN: [id number] DOB: 1946-10-17 Today's Date: 04/25/2024   History of Present Illness [name] is a 78 y.o. male presents after a fall. CT head  No acute intracranial abnormality, No skull fracture. CT cervical No acute fracture or subluxation of the cervical spine. L knee xray No acute fracture. PMH: CKD, prostate cancer, DM2, agent orange exposure, with radioactive bead placement and multiple associated surgical complications requiring anal drain, Fournier's gangrene  CAD, hx of CVA, hx of DVT, HLD, HTN, OSA    PT Comments  Pt assisted with ambulating in hallway and performing sit to stands.  Pt overall min assist for mobility at this time.  Pt prefers to d/c home however spouse feels concerned with this as she does not feel able to assist him safely yet.  Current recommendation for SNF appears appropriate as pt is not yet at baseline and spouse feels hesitant to provide assist.  Will continue to work with pt in acute setting and request mobility specialist as well in hopes pt could potentially progress to home.     If plan is discharge home, recommend the following: Assistance with cooking/housework;Assist for transportation;Help with stairs or ramp for entrance;A little help with walking and/or transfers;A little help with bathing/dressing/bathroom   Can travel by private vehicle     No  Equipment Recommendations  None recommended by PT    Recommendations for Other Services       Precautions / Restrictions Precautions Precautions: Fall     Mobility  Bed Mobility Overal bed mobility: Needs Assistance Bed Mobility: Supine to Sit, Sit to Supine     Supine to sit: Supervision Sit to supine: Supervision        Transfers Overall transfer level: Needs assistance Equipment used: Rolling walker (2 wheels) Transfers: Sit to/from Stand Sit to Stand: Min assist           General transfer comment: initially min  assist to rise and stabilize with wide BOS and cues for safety and hand placement, upon return to room practiced 5 sit to stands with good technique and only CGA    Ambulation/Gait Ambulation/Gait assistance: Contact guard assist Gait Distance (Feet): 120 Feet Assistive device: Rolling walker (2 wheels) Gait Pattern/deviations: Step-through pattern, Decreased stride length       General Gait Details: no buckling observed today however pt does reports left knee pain however states chronic and that he is in need of a TKA; steady with RW   Stairs             Wheelchair Mobility     Tilt Bed    Modified Rankin (Stroke Patients Only)       Balance Overall balance assessment: Needs assistance         Standing balance support: Reliant on assistive device for balance, During functional activity, Bilateral upper extremity supported Standing balance-Leahy Scale: Poor                              Communication Communication Communication: Impaired Factors Affecting Communication: Hearing impaired (HOH)  Cognition Arousal: Alert Behavior During Therapy: WFL for tasks assessed/performed   PT - Cognitive impairments: No apparent impairments                         Following commands: Intact      Cueing    Exercises  General Comments        Pertinent Vitals/Pain Pain Assessment Pain Assessment: Faces Faces Pain Scale: Hurts little more Pain Location: L knee with weight bearing Pain Descriptors / Indicators: Sore Pain Intervention(s): Repositioned, Monitored during session    Home Living Family/patient expects to be discharged to:: Private residence Living Arrangements: Spouse/significant other Available Help at Discharge: Family;Available 24 hours/day Type of Home: House Home Access: Stairs to enter   [employer] of Steps: 4 steps with no handrails at backdoor (primary entrance) and 5 steps with handrails at front  door     Home Equipment: Agricultural consultant (2 wheels);Cane - single point;Shower seat;Grab bars - tub/shower;Rollator (4 wheels) Additional Comments: pt reports son comes over daily to assist with household chores, son completes grocery shopping    Prior Function            PT Goals (current goals can now be found in the care plan section) Progress towards PT goals: Progressing toward goals    Frequency    Min 3X/week      PT Plan      Co-evaluation              AM-PAC PT "6 Clicks" Mobility   Outcome Measure  Help needed turning from your back to your side while in a flat bed without using bedrails?: A Little Help needed moving from lying on your back to sitting on the side of a flat bed without using bedrails?: A Little Help needed moving to and from a bed to a chair (including a wheelchair)?: A Little Help needed standing up from a chair using your arms (e.g., wheelchair or bedside chair)?: A Little Help needed to walk in hospital room?: A Little Help needed climbing 3-5 steps with a railing? : A Lot 6 Click Score: 17    End of Session Equipment Utilized During Treatment: Gait belt Activity Tolerance: Patient tolerated treatment well Patient left: in bed;with call bell/phone within reach;with bed alarm set;with family/visitor present   PT Visit Diagnosis: Difficulty in walking, not elsewhere classified (R26.2)     Time: 1340-1400 PT Time Calculation (min) (ACUTE ONLY): 20 min  Charges:    $Gait Training: 8-22 mins PT General Charges $$ ACUTE PT VISIT: 1 Visit                     Blanch Bunde, DPT Physical Therapist Acute Rehabilitation Services Office: [phone]    [name] 04/25/2024, 3:04 PM

## 2024-04-26 DIAGNOSIS — G4733 Obstructive sleep apnea (adult) (pediatric): Secondary | ICD-10-CM | POA: Diagnosis not present

## 2024-04-26 DIAGNOSIS — I10 Essential (primary) hypertension: Secondary | ICD-10-CM | POA: Diagnosis not present

## 2024-04-26 DIAGNOSIS — N179 Acute kidney failure, unspecified: Secondary | ICD-10-CM | POA: Diagnosis not present

## 2024-04-26 DIAGNOSIS — N1831 Chronic kidney disease, stage 3a: Secondary | ICD-10-CM | POA: Diagnosis not present

## 2024-04-26 LAB — GLUCOSE, CAPILLARY
Glucose-Capillary: 100 mg/dL — ABNORMAL HIGH (ref 70–99)
Glucose-Capillary: 101 mg/dL — ABNORMAL HIGH (ref 70–99)
Glucose-Capillary: 110 mg/dL — ABNORMAL HIGH (ref 70–99)
Glucose-Capillary: 111 mg/dL — ABNORMAL HIGH (ref 70–99)
Glucose-Capillary: 140 mg/dL — ABNORMAL HIGH (ref 70–99)
Glucose-Capillary: 162 mg/dL — ABNORMAL HIGH (ref 70–99)

## 2024-04-26 NOTE — Progress Notes (Signed)
 Mobility Specialist - Progress Note   04/26/24 1332  Mobility  Activity Ambulated with assistance in hallway  Level of Assistance Contact guard assist, steadying assist  Assistive Device Front wheel walker  Distance Ambulated (ft) 120 ft  Range of Motion/Exercises Active  Activity Response Tolerated well  Mobility Referral Yes  Mobility visit 1 Mobility  Mobility Specialist Start Time (ACUTE ONLY) 1320  Mobility Specialist Stop Time (ACUTE ONLY) 1332  Mobility Specialist Time Calculation (min) (ACUTE ONLY) 12 min   Pt was found in bed and agreeable to ambulate. No complaints with session and at EOS returned to bed with all needs met. Call bell in reach.  Lorna Rose,  Mobility Specialist Can be reached via Secure Chat

## 2024-04-26 NOTE — Progress Notes (Signed)
   04/26/24 2254  BiPAP/CPAP/SIPAP  Reason BIPAP/CPAP not in use Other(comment) (patient wanted me to turn on for him but not ready to put on at this time. i stated that he can let the nurse know when he is ready to put the cpap on)

## 2024-04-26 NOTE — Progress Notes (Signed)
 PROGRESS NOTE    [name]  NWG:[id number] DOB: 1946/01/18 DOA: 04/23/2024 PCP: [facility]   Brief Narrative: [name] is a 78 y.o. male with a history of CKD, CAD, diabetes mellitus, prostate cancer, agent orange exposure, transsphincteric anal fistula s/p drain, CVA, DVT, hyperlipidemia, hypertension, OSA.  Patient presented secondary to a fall with weakness and found to have evidence of AKI. IV fluids started with improvement in renal function.   Assessment and Plan:  AKI on CKD stage 3a Baseline creatinine of about 1.6 from earlier this year. Creatinine of 3.73 on admission. IV fluids started. Creatinine improved 1.61. AKI resolved.  Presyncope Patient with lightheadedness and fall. CT imaging without acute intracranial process. Transthoracic Echocardiogram ordered and is significant for a normal LVEF of 55-60% with grade 1 diastolic dysfunction and no evidence of aortic valve stenosis.  Left knee pain Secondary to fall. Knee x-ray without evidence of fracture.  Diabetes mellitus type 2 Well controlled for age. -Continue Semglee  and SSI  Chronic anemia stable  Primary hypertension Patient is on amlodipine  and Coreg  -Continue Coreg  and amlodipine   Hyperlipidemia Patient is on Lipitor as an outpatient -Continue Lipitor  History of stroke Noted. Patient is on aspirin  as an outpatient.  Hypokalemia Resolved with potassium supplementation.  OSA -Continue CPAP at bedtime   Prolonged QTc Noted.   DVT prophylaxis: SCDs Code Status:   Code Status: Full Code Family Communication: None at bedside Disposition Plan: Discharge home pending SNF bed   Consultants:  None  Procedures:  None  Antimicrobials: None    Subjective: No issues today. Hoping to still go home.  Objective: BP (!) 157/70 (BP Location: Right Arm)   Pulse 72   Temp 98 F (36.7 C)   Resp 17   Ht 5\' 10"  (1.778 m)   Wt 102.5 kg   SpO2 98%   BMI 32.42 kg/m    Examination:  General exam: Appears calm and comfortable Respiratory system: Clear to auscultation. Respiratory effort normal. Cardiovascular system: S1 & S2 heard, RRR. Gastrointestinal system: Abdomen is distended, soft and nontender. Normal bowel sounds heard. Central nervous system: Alert and oriented. No focal neurological deficits. Psychiatry: Judgement and insight appear normal. Mood & affect appropriate.    Data Reviewed: I have personally reviewed following labs and imaging studies  CBC Lab Results  Component Value Date   WBC 7.3 04/24/2024   RBC 3.28 (L) 04/24/2024   HGB 9.1 (L) 04/24/2024   HCT 29.4 (L) 04/24/2024   MCV 89.6 04/24/2024   MCH 27.7 04/24/2024   PLT 158 04/24/2024   MCHC 31.0 04/24/2024   RDW 13.2 04/24/2024   LYMPHSABS 1.8 04/23/2024   MONOABS 0.7 04/23/2024   EOSABS 0.3 04/23/2024   BASOSABS 0.0 04/23/2024     Last metabolic panel Lab Results  Component Value Date   NA 141 04/25/2024   K 4.0 04/25/2024   CL 114 (H) 04/25/2024   CO2 20 (L) 04/25/2024   BUN 28 (H) 04/25/2024   CREATININE 1.61 (H) 04/25/2024   GLUCOSE 103 (H) 04/25/2024   GFRNONAA 44 (L) 04/25/2024   GFRAA 54 (L) 01/23/2017   CALCIUM  8.2 (L) 04/25/2024   PHOS 3.1 04/24/2024   PROT 6.1 (L) 04/24/2024   ALBUMIN 3.0 (L) 04/24/2024   BILITOT 0.4 04/24/2024   ALKPHOS 103 04/24/2024   AST 22 04/24/2024   ALT 14 04/24/2024   ANIONGAP 7 04/25/2024    GFR: Estimated Creatinine Clearance: 45.4 mL/min (A) (by C-G formula based  on SCr of 1.61 mg/dL (H)).  No results found for this or any previous visit (from the past 240 hours).    Radiology Studies: ECHOCARDIOGRAM COMPLETE Result Date: 04/24/2024    ECHOCARDIOGRAM REPORT   Patient Name:   [name] Date of Exam: 04/24/2024 Medical Rec #:  [id number]     Height:       70.0 in Accession #:    [id number]    Weight:       226.0 lb Date of Birth:  1945-11-21     BSA:          2.198 m Patient Age:    77 years      BP:            125/69 mmHg Patient Gender: M             HR:           79 bpm. Exam Location:  Inpatient Procedure: 2D Echo (Both Spectral and Color Flow Doppler were utilized during            procedure). Indications:    syncope  History:        Patient has no prior history of Echocardiogram examinations.                 Signs/Symptoms:Syncope.  Sonographer:    Hersey Lorenzo Referring Phys: [id number] ANASTASSIA DOUTOVA IMPRESSIONS  1. Left ventricular ejection fraction, by estimation, is 55 to 60%. The left ventricle has normal function. The left ventricle has no regional wall motion abnormalities. There is mild left ventricular hypertrophy. Left ventricular diastolic parameters are consistent with Grade I diastolic dysfunction (impaired relaxation).  2. Right ventricular systolic function is normal. The right ventricular size is normal.  3. Left atrial size was moderately dilated.  4. The mitral valve is abnormal. Trivial mitral valve regurgitation. No evidence of mitral stenosis.  5. The aortic valve was not well visualized. There is mild calcification of the aortic valve. There is mild thickening of the aortic valve. Aortic valve regurgitation is not visualized. Aortic valve sclerosis is present, with no evidence of aortic valve  stenosis.  6. The inferior vena cava is normal in size with greater than 50% respiratory variability, suggesting right atrial pressure of 3 mmHg. FINDINGS  Left Ventricle: Left ventricular ejection fraction, by estimation, is 55 to 60%. The left ventricle has normal function. The left ventricle has no regional wall motion abnormalities. Strain was performed and the global longitudinal strain is indeterminate. The left ventricular internal cavity size was normal in size. There is mild left ventricular hypertrophy. Left ventricular diastolic parameters are consistent with Grade I diastolic dysfunction (impaired relaxation). Right Ventricle: The right ventricular size is normal. No increase in right  ventricular wall thickness. Right ventricular systolic function is normal. Left Atrium: Left atrial size was moderately dilated. Right Atrium: Right atrial size was normal in size. Pericardium: There is no evidence of pericardial effusion. Mitral Valve: The mitral valve is abnormal. There is mild thickening of the mitral valve leaflet(s). There is mild calcification of the mitral valve leaflet(s). Mild mitral annular calcification. Trivial mitral valve regurgitation. No evidence of mitral valve stenosis. Tricuspid Valve: The tricuspid valve is normal in structure. Tricuspid valve regurgitation is not demonstrated. No evidence of tricuspid stenosis. Aortic Valve: The aortic valve was not well visualized. There is mild calcification of the aortic valve. There is mild thickening of the aortic valve. Aortic valve regurgitation is not visualized. Aortic valve sclerosis is  present, with no evidence of aortic valve stenosis. Pulmonic Valve: The pulmonic valve was normal in structure. Pulmonic valve regurgitation is not visualized. No evidence of pulmonic stenosis. Aorta: The aortic root is normal in size and structure. Venous: The inferior vena cava is normal in size with greater than 50% respiratory variability, suggesting right atrial pressure of 3 mmHg. IAS/Shunts: No atrial level shunt detected by color flow Doppler. Additional Comments: 3D was performed not requiring image post processing on an independent workstation and was indeterminate.  LEFT VENTRICLE PLAX 2D LVIDd:         5.00 cm      Diastology LVIDs:         3.90 cm      LV e' medial:    5.00 cm/s LV PW:         1.10 cm      LV E/e' medial:  19.0 LV IVS:        1.10 cm      LV e' lateral:   8.49 cm/s LVOT diam:     2.30 cm      LV E/e' lateral: 11.2 LV SV:         95 LV SV Index:   43 LVOT Area:     4.15 cm  LV Volumes (MOD) LV vol d, MOD A4C: 123.0 ml LV vol s, MOD A4C: 59.2 ml LV SV MOD A4C:     123.0 ml RIGHT VENTRICLE             IVC RV S prime:      11.20 cm/s  IVC diam: 1.50 cm TAPSE (M-mode): 2.7 cm LEFT ATRIUM             Index LA diam:        4.30 cm 1.96 cm/m LA Vol (A2C):   55.8 ml 25.38 ml/m LA Vol (A4C):   56.7 ml 25.79 ml/m LA Biplane Vol: 59.4 ml 27.02 ml/m  AORTIC VALVE LVOT Vmax:   111.00 cm/s LVOT Vmean:  78.000 cm/s LVOT VTI:    0.228 m  AORTA Ao Root diam: 3.50 cm Ao Asc diam:  3.10 cm MITRAL VALVE MV Area (PHT): 2.75 cm     SHUNTS MV Decel Time: 276 msec     Systemic VTI:  0.23 m MV E velocity: 94.80 cm/s   Systemic Diam: 2.30 cm MV A velocity: 117.00 cm/s MV E/A ratio:  0.81 Janelle Mediate MD Electronically signed by Janelle Mediate MD Signature Date/Time: 04/24/2024/5:27:31 PM    Final       LOS: 3 days    Aneita Keens, MD [facility] 04/26/2024, 1:22 PM   If 7PM-7AM, please contact night-coverage [email or web address]

## 2024-04-26 NOTE — Plan of Care (Signed)

## 2024-04-26 NOTE — Plan of Care (Signed)
   Problem: Education: Goal: Ability to describe self-care measures that may prevent or decrease complications (Diabetes Survival Skills Education) will improve Outcome: Progressing   Problem: Coping: Goal: Ability to adjust to condition or change in health will improve Outcome: Progressing   Problem: Fluid Volume: Goal: Ability to maintain a balanced intake and output will improve Outcome: Progressing   Problem: Education: Goal: Knowledge of General Education information will improve Description: Including pain rating scale, medication(s)/side effects and non-pharmacologic comfort measures Outcome: Progressing

## 2024-04-27 DIAGNOSIS — N179 Acute kidney failure, unspecified: Secondary | ICD-10-CM | POA: Diagnosis not present

## 2024-04-27 LAB — GLUCOSE, CAPILLARY
Glucose-Capillary: 115 mg/dL — ABNORMAL HIGH (ref 70–99)
Glucose-Capillary: 101 mg/dL — ABNORMAL HIGH (ref 70–99)
Glucose-Capillary: 150 mg/dL — ABNORMAL HIGH (ref 70–99)

## 2024-04-27 NOTE — TOC Transition Note (Signed)
 Transition of Care [facility]) - Discharge Note   Patient Details  Name: [name] MRN: [id number] Date of Birth: 08-Jul-1946  [facility] CM/SW Contact:  Marty Sleet, LCSW Phone Number: 04/27/2024, 11:30 AM   Clinical Narrative:    Pt able to progress with PT and no longer recommended for SNF placement. Met with pt who is agreeable to plan to return home with home health. Pt denies having HH in the past and does not have preference for HHA. HHPT/OT has been arranged with [facility]. HH orders are in place. No DME needs identified.    Final next level of care: Home w Home Health Services Barriers to Discharge: No Barriers Identified   Patient Goals and CMS Choice Patient states their goals for this hospitalization and ongoing recovery are:: home CMS Medicare.gov Compare Post Acute Care list provided to:: Patient Choice offered to / list presented to : Patient [place] ownership interest in [facility].provided to:: Patient    Discharge Placement                       Discharge Plan and Services Additional resources added to the After Visit Summary for     Discharge Planning Services: CM Consult            DME Arranged: N/A DME Agency: NA       HH Arranged: PT, OT HH Agency: [facility] Date [facility] Agency Contacted: 04/27/24 Time HH Agency Contacted: [id number] Representative spoke with at [facility] Agency: Angie  Social Drivers of Health (SDOH) Interventions SDOH Screenings   Food Insecurity: No Food Insecurity (04/24/2024)  Housing: Low Risk  (04/24/2024)  Transportation Needs: No Transportation Needs (04/24/2024)  Utilities: Not At Risk (04/24/2024)  Social Connections: Unknown (04/03/2022)   Received from [facility]  Tobacco Use: Low Risk  (04/23/2024)     Readmission Risk Interventions    04/24/2024    4:29 PM  Readmission Risk Prevention Plan  Transportation Screening Complete  PCP or Specialist Appt within 3-5 Days  Complete  HRI or Home Care Consult Complete  Social Work Consult for Recovery Care Planning/Counseling Complete  Palliative Care Screening Not Applicable  Medication Review Oceanographer) Complete

## 2024-04-27 NOTE — Discharge Instructions (Signed)
 [name],  You were in the hospital with nearly passing out from dehydration and renal impairment which has improved with IV fluids. Please follow-up with your PCP.

## 2024-04-27 NOTE — Discharge Summary (Signed)
 Physician Discharge Summary   Patient: [name] MRN: [id number] DOB: 1946-06-17  Admit date:     04/23/2024  Discharge date: 04/27/24  Discharge Physician: Aneita Keens, MD   PCP: [facility]   Recommendations at discharge:  PCP visit for hospital follow-up  Discharge Diagnoses: Principal Problem:   AKI (acute kidney injury) (HCC) Active Problems:   CKD (chronic kidney disease)   DM (diabetes mellitus) (HCC)   Anemia   HTN (hypertension)   Hyperlipidemia   Dehydration   History of stroke   Hypokalemia   Prolonged QT interval   OSA (obstructive sleep apnea)  Resolved Problems:   * No resolved hospital problems. *  Hospital Course: [name] is a 78 y.o. male with a history of CKD, CAD, diabetes mellitus, prostate cancer, agent orange exposure, transsphincteric anal fistula s/p drain, CVA, DVT, hyperlipidemia, hypertension, OSA.  Patient presented secondary to a fall with weakness and found to have evidence of AKI. IV fluids started with improvement in renal function.  Assessment and Plan:  AKI on CKD stage 3a Baseline creatinine of about 1.6 from earlier this year. Creatinine of 3.73 on admission. IV fluids started. Creatinine improved 1.61. AKI resolved.   Presyncope Patient with lightheadedness and fall. CT imaging without acute intracranial process. Transthoracic Echocardiogram ordered and is significant for a normal LVEF of 55-60% with grade 1 diastolic dysfunction and no evidence of aortic valve stenosis.   Left knee pain Secondary to fall. Knee x-ray without evidence of fracture.   Diabetes mellitus type 2 Well controlled for age. Continue home insulin  regimen.   Chronic anemia Stable.   Primary hypertension Patient is on amlodipine  and Coreg . Continue on discharge.   Hyperlipidemia Patient is on Lipitor as an outpatient. Continue on discharge.   History of stroke Noted. Patient is on aspirin  as an outpatient.   Hypokalemia Resolved with  potassium supplementation.   OSA Continue CPAP at bedtime   Prolonged QTc Noted.   Consultants: None Procedures performed: None  Disposition: Home health Diet recommendation: Carb modified diet   DISCHARGE MEDICATION: Allergies as of 04/27/2024   No Known Allergies      Medication List     TAKE these medications    acetaminophen  500 MG tablet Commonly known as: TYLENOL  Take 1,000 mg by mouth every 6 (six) hours as needed for moderate pain.   amitriptyline  50 MG tablet Commonly known as: ELAVIL  Take 50 mg by mouth at bedtime.   amLODipine  10 MG tablet Commonly known as: NORVASC  Take 10 mg by mouth at bedtime.   aspirin  EC 81 MG tablet Take 162 mg by mouth daily. Swallow whole.   atorvastatin  20 MG tablet Commonly known as: LIPITOR Take 20 mg by mouth at bedtime.   buPROPion  150 MG 24 hr tablet Commonly known as: WELLBUTRIN  XL Take 150 mg by mouth daily.   camphor-menthol  lotion Commonly known as: SARNA Apply 1 application topically daily.   Carboxymethylcellulose Sod PF 0.5 % Soln Place 1 drop into both eyes in the morning, at noon, in the evening, and at bedtime.   carvedilol  12.5 MG tablet Commonly known as: COREG  Take 12.5 mg by mouth 2 (two) times daily with a meal.   colchicine  0.6 MG tablet Take 0.6 mg by mouth daily as needed (gout).   cyanocobalamin  1000 MCG tablet Commonly known as: VITAMIN B12 Take 1,000 mcg by mouth daily.   febuxostat  40 MG tablet Commonly known as: ULORIC  Take 40 mg by mouth at bedtime.  ferrous sulfate  325 (65 FE) MG tablet Commonly known as: FerrouSul Take 1 tablet (325 mg total) by mouth 3 (three) times daily with meals. What changed: when to take this   furosemide 20 MG tablet Commonly known as: LASIX Take 20 mg by mouth daily.   gabapentin  300 MG capsule Commonly known as: NEURONTIN  Take 300-600 mg by mouth 2 (two) times daily. Take 2 capsules (600 mg) in the morning and Take 1 capsule (300 mg) at  bedtime   hydrocortisone  2.5 % cream Apply 1 application topically at bedtime.   hydrocortisone  25 MG suppository Commonly known as: ANUSOL -HC Place 1 suppository (25 mg total) rectally 2 (two) times daily. What changed:  when to take this reasons to take this   insulin  glargine 100 UNIT/ML injection Commonly known as: LANTUS  Inject 0.15 mLs (15 Units total) into the skin at bedtime. What changed: how much to take   neomycin-bacitracin-polymyxin [id number] ointment Commonly known as: NEOSPORIN Apply 1 application topically daily as needed for wound care.   nitroGLYCERIN 0.4 MG SL tablet Commonly known as: NITROSTAT Place 0.4 mg under the tongue every 5 (five) minutes as needed for chest pain.   polycarbophil 625 MG tablet Commonly known as: FIBERCON Take 1 tablet (625 mg total) by mouth daily.   tamsulosin  0.4 MG Caps capsule Commonly known as: FLOMAX  Take 1 capsule (0.4 mg total) by mouth daily after supper.   tetrahydrozoline 0.05 % ophthalmic solution Place 3 drops into both eyes daily as needed.   VITAMIN B-6 PO Take 2 tablets by mouth daily.        Follow-up Information     [facility]. Schedule an appointment as soon as possible for a visit in 1 week(s).   Specialty: General Practice Why: For hospital follow-up Contact information: [address] [place] [address] [phone]                Discharge Exam: BP (!) 140/51 (BP Location: Left Arm)   Pulse 72   Temp 98.2 F (36.8 C) (Oral)   Resp 19   Ht 5\' 10"  (1.778 m)   Wt 102.5 kg   SpO2 97%   BMI 32.42 kg/m   General exam: Appears calm and comfortable Respiratory system: Clear to auscultation. Respiratory effort normal. Cardiovascular system: S1 & S2 heard, RRR. No murmurs, rubs, gallops or clicks. Gastrointestinal system: Abdomen is nondistended, soft and nontender. Normal bowel sounds heard. Central nervous system: Alert and oriented. No focal neurological  deficits. Psychiatry: Judgement and insight appear normal. Mood & affect appropriate.   Condition at discharge: stable  The results of significant diagnostics from this hospitalization (including imaging, microbiology, ancillary and laboratory) are listed below for reference.   Imaging Studies: ECHOCARDIOGRAM COMPLETE Result Date: 04/24/2024    ECHOCARDIOGRAM REPORT   Patient Name:   [name] Date of Exam: 04/24/2024 Medical Rec #:  [id number]     Height:       70.0 in Accession #:    [id number]    Weight:       226.0 lb Date of Birth:  Sep 21, 1946     BSA:          2.198 m Patient Age:    77 years      BP:           125/69 mmHg Patient Gender: M             HR:           79  bpm. Exam Location:  Inpatient Procedure: 2D Echo (Both Spectral and Color Flow Doppler were utilized during            procedure). Indications:    syncope  History:        Patient has no prior history of Echocardiogram examinations.                 Signs/Symptoms:Syncope.  Sonographer:    Hersey Lorenzo Referring Phys: [id number] ANASTASSIA DOUTOVA IMPRESSIONS  1. Left ventricular ejection fraction, by estimation, is 55 to 60%. The left ventricle has normal function. The left ventricle has no regional wall motion abnormalities. There is mild left ventricular hypertrophy. Left ventricular diastolic parameters are consistent with Grade I diastolic dysfunction (impaired relaxation).  2. Right ventricular systolic function is normal. The right ventricular size is normal.  3. Left atrial size was moderately dilated.  4. The mitral valve is abnormal. Trivial mitral valve regurgitation. No evidence of mitral stenosis.  5. The aortic valve was not well visualized. There is mild calcification of the aortic valve. There is mild thickening of the aortic valve. Aortic valve regurgitation is not visualized. Aortic valve sclerosis is present, with no evidence of aortic valve  stenosis.  6. The inferior vena cava is normal in size with greater than 50%  respiratory variability, suggesting right atrial pressure of 3 mmHg. FINDINGS  Left Ventricle: Left ventricular ejection fraction, by estimation, is 55 to 60%. The left ventricle has normal function. The left ventricle has no regional wall motion abnormalities. Strain was performed and the global longitudinal strain is indeterminate. The left ventricular internal cavity size was normal in size. There is mild left ventricular hypertrophy. Left ventricular diastolic parameters are consistent with Grade I diastolic dysfunction (impaired relaxation). Right Ventricle: The right ventricular size is normal. No increase in right ventricular wall thickness. Right ventricular systolic function is normal. Left Atrium: Left atrial size was moderately dilated. Right Atrium: Right atrial size was normal in size. Pericardium: There is no evidence of pericardial effusion. Mitral Valve: The mitral valve is abnormal. There is mild thickening of the mitral valve leaflet(s). There is mild calcification of the mitral valve leaflet(s). Mild mitral annular calcification. Trivial mitral valve regurgitation. No evidence of mitral valve stenosis. Tricuspid Valve: The tricuspid valve is normal in structure. Tricuspid valve regurgitation is not demonstrated. No evidence of tricuspid stenosis. Aortic Valve: The aortic valve was not well visualized. There is mild calcification of the aortic valve. There is mild thickening of the aortic valve. Aortic valve regurgitation is not visualized. Aortic valve sclerosis is present, with no evidence of aortic valve stenosis. Pulmonic Valve: The pulmonic valve was normal in structure. Pulmonic valve regurgitation is not visualized. No evidence of pulmonic stenosis. Aorta: The aortic root is normal in size and structure. Venous: The inferior vena cava is normal in size with greater than 50% respiratory variability, suggesting right atrial pressure of 3 mmHg. IAS/Shunts: No atrial level shunt detected by  color flow Doppler. Additional Comments: 3D was performed not requiring image post processing on an independent workstation and was indeterminate.  LEFT VENTRICLE PLAX 2D LVIDd:         5.00 cm      Diastology LVIDs:         3.90 cm      LV e' medial:    5.00 cm/s LV PW:         1.10 cm      LV E/e' medial:  19.0 LV IVS:  1.10 cm      LV e' lateral:   8.49 cm/s LVOT diam:     2.30 cm      LV E/e' lateral: 11.2 LV SV:         95 LV SV Index:   43 LVOT Area:     4.15 cm  LV Volumes (MOD) LV vol d, MOD A4C: 123.0 ml LV vol s, MOD A4C: 59.2 ml LV SV MOD A4C:     123.0 ml RIGHT VENTRICLE             IVC RV S prime:     11.20 cm/s  IVC diam: 1.50 cm TAPSE (M-mode): 2.7 cm LEFT ATRIUM             Index LA diam:        4.30 cm 1.96 cm/m LA Vol (A2C):   55.8 ml 25.38 ml/m LA Vol (A4C):   56.7 ml 25.79 ml/m LA Biplane Vol: 59.4 ml 27.02 ml/m  AORTIC VALVE LVOT Vmax:   111.00 cm/s LVOT Vmean:  78.000 cm/s LVOT VTI:    0.228 m  AORTA Ao Root diam: 3.50 cm Ao Asc diam:  3.10 cm MITRAL VALVE MV Area (PHT): 2.75 cm     SHUNTS MV Decel Time: 276 msec     Systemic VTI:  0.23 m MV E velocity: 94.80 cm/s   Systemic Diam: 2.30 cm MV A velocity: 117.00 cm/s MV E/A ratio:  0.81 Janelle Mediate MD Electronically signed by Janelle Mediate MD Signature Date/Time: 04/24/2024/5:27:31 PM    Final    CT HEAD WO CONTRAST ( ) Result Date: 04/23/2024 CLINICAL DATA:  Head trauma. Fall yesterday and again today. Dizziness before fall. EXAM: CT HEAD WITHOUT CONTRAST TECHNIQUE: Contiguous axial images were obtained from the base of the skull through the vertex without intravenous contrast. RADIATION DOSE REDUCTION: This exam was performed according to the departmental dose-optimization program which includes automated exposure control, adjustment of the mA and/or kV according to patient size and/or use of iterative reconstruction technique. COMPARISON:  None Available. FINDINGS: Brain: No intracranial hemorrhage, mass effect, or midline shift.  Age related atrophy. No hydrocephalus. The basilar cisterns are patent. Mild periventricular and deep white matter hypodensity typical of chronic small vessel ischemia. Remote lacunar infarct in the left caudate no evidence of territorial infarct or acute ischemia. No extra-axial or intracranial fluid collection. Vascular: Atherosclerosis of skullbase vasculature without hyperdense vessel or abnormal calcification. Skull: No fracture or focal lesion. Sinuses/Orbits: Mucosal thickening throughout ethmoid air cells. No sinus fluid levels. No mastoid effusion. Other: None IMPRESSION: 1. No acute intracranial abnormality. No skull fracture. 2. Age related atrophy and chronic small vessel ischemia. Remote lacunar infarct in the left caudate. Electronically Signed   By: Chadwick Colonel M.D.   On: 04/23/2024 18:31   CT CHEST ABDOMEN PELVIS WO CONTRAST Result Date: 04/23/2024 CLINICAL DATA:  Trauma EXAM: CT CHEST, ABDOMEN AND PELVIS WITHOUT CONTRAST TECHNIQUE: Multidetector CT imaging of the chest, abdomen and pelvis was performed following the standard protocol without IV contrast. RADIATION DOSE REDUCTION: This exam was performed according to the departmental dose-optimization program which includes automated exposure control, adjustment of the mA and/or kV according to patient size and/or use of iterative reconstruction technique. COMPARISON:  CT abdomen and pelvis 03/31/2021. FINDINGS: CT CHEST FINDINGS Cardiovascular: No significant vascular findings. Normal heart size. No pericardial effusion. There are atherosclerotic calcifications of the aorta and coronary arteries. Mediastinum/Nodes: There is a posterior left thyroid nodule measuring 11 mm. Thyroid  gland is otherwise within normal limits. No enlarged lymph nodes are seen. There is distal esophageal wall thickening and small hiatal hernia. Lungs/Pleura: There are atelectatic changes in the bilateral lower lobes. The lungs are otherwise clear. There is no pleural  effusion or pneumothorax. Musculoskeletal: No acute fracture identified. CT ABDOMEN PELVIS FINDINGS Hepatobiliary: Gallstones are present. There is no biliary ductal dilatation. The liver is within normal limits. Pancreas: Unremarkable. No pancreatic ductal dilatation or surrounding inflammatory changes. Spleen: No splenic injury or perisplenic hematoma. Adrenals/Urinary Tract: No adrenal hemorrhage or renal injury identified. Bladder is unremarkable. Stomach/Bowel: Stomach is within normal limits. Appendix is not seen. No evidence of bowel wall thickening, distention, or inflammatory changes. Sigmoid colon diverticulosis is present. Left perianal/perirectal fistula catheter present. No focal fluid collections are seen. Vascular/Lymphatic: Aortic atherosclerosis. No enlarged abdominal or pelvic lymph nodes. Reproductive: Prostate radiotherapy seeds are present. Other: Small fat containing umbilical hernia present. No ascites present. There is a 1.7 x 1.7 x 1.4 cm density in the left gluteal subcutaneous tissues, new from prior. Musculoskeletal: No acute fractures are seen. Right hip arthroplasty is present. IMPRESSION: 1. No acute posttraumatic sequelae in the chest, abdomen or pelvis. 2. Left perianal/perirectal catheter within a fistula. 3. 1.7 cm density in the left gluteal subcutaneous tissues, new from prior, possibly a hematoma. Enlarged lymph node is also in the differential. 4. Cholelithiasis. 5. Distal esophageal wall thickening and small hiatal hernia. 6. Aortic atherosclerosis. Aortic Atherosclerosis (ICD10-I70.0). Electronically Signed   By: Tyron Gallon M.D.   On: 04/23/2024 18:26   CT CERVICAL SPINE WO CONTRAST Result Date: 04/23/2024 CLINICAL DATA:  Fall yesterday and again today. Dizziness before fall. EXAM: CT CERVICAL SPINE WITHOUT CONTRAST TECHNIQUE: Multidetector CT imaging of the cervical spine was performed without intravenous contrast. Multiplanar CT image reconstructions were also  generated. RADIATION DOSE REDUCTION: This exam was performed according to the departmental dose-optimization program which includes automated exposure control, adjustment of the mA and/or kV according to patient size and/or use of iterative reconstruction technique. COMPARISON:  None Available. FINDINGS: Alignment: Normal. Skull base and vertebrae: No acute fracture. Vertebral body heights are maintained. The dens and skull base are intact. Vague area of sclerosis involving the left aspect of C4 vertebra has no suspicious features. Soft tissues and spinal canal: No prevertebral fluid or swelling. No visible canal hematoma. Disc levels: Mild diffuse anterior spurring. Moderate multilevel facet hypertrophy. Upper chest: Assessed on concurrent chest CT, reported separately Other: Carotid calcifications. IMPRESSION: 1. No acute fracture or subluxation of the cervical spine. 2. Multilevel degenerative change. Electronically Signed   By: Chadwick Colonel M.D.   On: 04/23/2024 18:26   DG Knee Complete 4 Views Left Result Date: 04/23/2024 CLINICAL DATA:  Left knee pain following a fall yesterday. EXAM: LEFT KNEE - COMPLETE 4+ VIEW COMPARISON:  07/19/2010 FINDINGS: Tricompartmental degenerative changes with marked medial joint space narrowing with progression and stable marked patellofemoral joint space narrowing and extensive spur formation. Interval fragmentation of the superior spur. This is corticated without the appearance of an acute fracture. Moderate medial and lateral spur formation with corticated bony fragmentation medially. No acute fracture, dislocation or effusion. Atheromatous arterial calcifications. IMPRESSION: 1. No acute fracture. 2. Tricompartmental degenerative changes, marked in the medial and patellofemoral compartments. Electronically Signed   By: Catherin Closs M.D.   On: 04/23/2024 17:07    Microbiology: Results for orders placed or performed during the hospital encounter of 03/31/21  Blood  culture (routine single)     Status:  None   Collection Time: 03/31/21  6:00 PM   Specimen: BLOOD  Result Value Ref Range Status   Specimen Description   Final    BLOOD BLOOD LEFT HAND Performed at [facility], [address], [place], [place] [address]    Special Requests   Final    BOTTLES DRAWN AEROBIC AND ANAEROBIC Blood Culture adequate volume Performed at [facility], [address], [place], [place] [address]    Culture   Final    NO GROWTH 5 DAYS Performed at [facility], [address] [place], [place] [address]    Report Status 04/05/2021 FINAL  Final  Resp Panel by RT-PCR (Flu A&B, Covid) Nasopharyngeal Swab     Status: Abnormal   Collection Time: 03/31/21  7:52 PM   Specimen: Nasopharyngeal Swab; Nasopharyngeal(NP) swabs in vial transport medium  Result Value Ref Range Status   SARS Coronavirus 2 by RT PCR POSITIVE (A) NEGATIVE Final    Comment: RESULT CALLED TO, READ BACK BY AND VERIFIED WITH: BANNO,A 03/31/21 @2147  BY SEELMOL (NOTE) SARS-CoV-2 target nucleic acids are DETECTED.  The SARS-CoV-2 RNA is generally detectable in upper respiratory specimens during the acute phase of infection. Positive results are indicative of the presence of the identified virus, but do not rule out bacterial infection or co-infection with other pathogens not detected by the test. Clinical correlation with patient history and other diagnostic information is necessary to determine patient infection status. The expected result is Negative.  Fact Sheet for Patients: BloggerCourse.com  Fact Sheet for Healthcare Providers: SeriousBroker.it  This test is not yet approved or cleared by the [place]  FDA and  has been authorized for detection and/or diagnosis of SARS-CoV-2 by FDA under an Emergency Use Authorization (EUA).  This EUA will remain in effect (meaning this test can be  used)  for the duration of  the COVID-19 declaration under Section 564(b)(1) of the Act, 21 U.S.C. section 360bbb-3(b)(1), unless the authorization is terminated or revoked sooner.     Influenza A by PCR NEGATIVE NEGATIVE Final   Influenza B by PCR NEGATIVE NEGATIVE Final    Comment: (NOTE) The Xpert Xpress SARS-CoV-2/FLU/RSV plus assay is intended as an aid in the diagnosis of influenza from Nasopharyngeal swab specimens and should not be used as a sole basis for treatment. Nasal washings and aspirates are unacceptable for Xpert Xpress SARS-CoV-2/FLU/RSV testing.  Fact Sheet for Patients: BloggerCourse.com  Fact Sheet for Healthcare Providers: SeriousBroker.it  This test is not yet approved or cleared by the [place]  FDA and has been authorized for detection and/or diagnosis of SARS-CoV-2 by FDA under an Emergency Use Authorization (EUA). This EUA will remain in effect (meaning this test can be used) for the duration of the COVID-19 declaration under Section 564(b)(1) of the Act, 21 U.S.C. section 360bbb-3(b)(1), unless the authorization is terminated or revoked.  Performed at [facility], [address], [place], [place] [address]   MRSA PCR Screening     Status: None   Collection Time: 04/01/21  3:45 AM   Specimen: Nasal Mucosa; Nasopharyngeal  Result Value Ref Range Status   MRSA by PCR NEGATIVE NEGATIVE Final    Comment:        The GeneXpert MRSA Assay (FDA approved for NASAL specimens only), is one component of a comprehensive MRSA colonization surveillance program. It is not intended to diagnose MRSA infection nor to guide or monitor treatment for MRSA infections. Performed at [facility], [address] [place], [place] [address]   Culture, blood (Routine X 2) w Reflex to ID Panel     Status: None   Collection Time: 04/01/21  7:06 AM   Specimen: BLOOD  Result Value Ref  Range Status   Specimen Description   Final    BLOOD LEFT HAND Performed at [facility], [address], [place], [place] [address]    Special Requests   Final    BOTTLES DRAWN AEROBIC ONLY Blood Culture adequate volume Performed at [facility], [address], [place], [place] [address]    Culture   Final    NO GROWTH 5 DAYS Performed at [facility], [address], [place], [place] [address]    Report Status 04/06/2021 FINAL  Final  Aerobic/Anaerobic Culture w Gram Stain (surgical/deep wound)     Status: None   Collection Time: 04/01/21 10:29 AM   Specimen: PATH Other; Tissue  Result Value Ref Range Status   Specimen Description   Final    PERINEAL ABCESS Performed at [facility], [address], [place], [place] [address]    Special Requests   Final    NONE Performed at [facility], [address], [place], [place] [address]    Gram Stain   Final    MODERATE WBC PRESENT, PREDOMINANTLY PMN FEW GRAM POSITIVE COCCI FEW GRAM NEGATIVE RODS    Culture   Final    FEW ESCHERICHIA COLI FEW BACTEROIDES FRAGILIS BETA LACTAMASE POSITIVE Performed at [facility], [address] [place], [place] [address]    Report Status 04/04/2021 FINAL  Final   Organism ID, Bacteria ESCHERICHIA COLI  Final      Susceptibility   Escherichia coli - MIC*    AMPICILLIN  <=2 SENSITIVE Sensitive     CEFAZOLIN  <=4 SENSITIVE Sensitive     CEFEPIME  <=0.12 SENSITIVE Sensitive     CEFTAZIDIME <=1 SENSITIVE Sensitive     CEFTRIAXONE  <=0.25 SENSITIVE Sensitive     CIPROFLOXACIN <=0.25 SENSITIVE Sensitive     GENTAMICIN <=1 SENSITIVE Sensitive     IMIPENEM <=0.25 SENSITIVE Sensitive     TRIMETH/SULFA <=20 SENSITIVE Sensitive     AMPICILLIN /SULBACTAM <=2 SENSITIVE Sensitive     PIP/TAZO <=4 SENSITIVE Sensitive     * FEW ESCHERICHIA COLI  Culture, Urine     Status: None   Collection Time: 04/01/21  4:03 PM    Specimen: Urine, Clean Catch  Result Value Ref Range Status   Specimen Description   Final    URINE, CLEAN CATCH Performed at [facility], [address], [place], [place] [address]    Special Requests   Final    NONE Performed at [facility], [address] [place], [place] [address]    Culture   Final    NO GROWTH Performed at [facility], [address], [place], [place] [address]    Report Status 04/02/2021 FINAL  Final  C Difficile Quick Screen (NO PCR Reflex)     Status: None   Collection Time: 04/05/21  1:25 PM   Specimen: STOOL  Result Value Ref Range Status   C Diff antigen NEGATIVE NEGATIVE Final   C Diff toxin NEGATIVE NEGATIVE Final   C Diff interpretation No C. difficile detected.  Final    Comment: Performed at [facility], [address], [place], [place] [address]    Labs: CBC: Recent  Labs  Lab 04/23/24 1626 04/24/24 0451  WBC 10.2 7.3  NEUTROABS 7.3  --   HGB 9.9* 9.1*  HCT 31.2* 29.4*  MCV 88.4 89.6  PLT 153 158   Basic Metabolic Panel: Recent Labs  Lab 04/23/24 1626 04/23/24 1956 04/24/24 0451 04/25/24 0625  NA 139  --  140 141  K 3.1*  --  3.4* 4.0  CL 107  --  108 114*  CO2 21*  --  22 20*  GLUCOSE 162*  --  118* 103*  BUN 47*  --  44* 28*  CREATININE 3.73*  --  3.04* 1.61*  CALCIUM  8.5*  --  8.5* 8.2*  MG  --  1.7 1.9  --   PHOS  --  3.3 3.1  --    Liver Function Tests: Recent Labs  Lab 04/23/24 1626 04/24/24 0451  AST 21 22  ALT 14 14  ALKPHOS 111 103  BILITOT 0.7 0.4  PROT 6.5 6.1*  ALBUMIN 3.3* 3.0*   CBG: Recent Labs  Lab 04/26/24 1605 04/26/24 2031 04/26/24 2356 04/27/24 0428 04/27/24 0730  GLUCAP 140* 162* 101* 115* 101*    Discharge time spent: 35 minutes.  Signed: Aneita Keens, MD [facility] 04/27/2024

## 2024-04-27 NOTE — Progress Notes (Signed)
 Physical Therapy Treatment Patient Details Name: [name] MRN: [id number] DOB: 1946/03/23 Today's Date: 04/27/2024   History of Present Illness [name] is a 78 y.o. male presents after a fall. CT head  No acute intracranial abnormality, No skull fracture. CT cervical No acute fracture or subluxation of the cervical spine. L knee xray No acute fracture. PMH: CKD, prostate cancer, DM2, agent orange exposure, with radioactive bead placement and multiple associated surgical complications requiring anal drain, Fournier's gangrene  CAD, hx of CVA, hx of DVT, HLD, HTN, OSA    PT Comments   Pt admitted with above diagnosis.  Pt currently with functional limitations due to the deficits listed below (see PT Problem List). Pt seated EOB and pt agreeable to therapy intervention. Pt reports anticipates d/c today. Pt is mod I for bed mobility, S with min cues for transfers and gait tasks, close S to CGA for safety with step navigation and one handrail. Pt left seated EOB per pt preference and all needs in place. Pt will benefit from [facility] services at time of d/c. Pt will benefit from acute skilled PT to increase their independence and safety with mobility to allow discharge.      If plan is discharge home, recommend the following: Assistance with cooking/housework;Assist for transportation;Help with stairs or ramp for entrance;A little help with walking and/or transfers;A little help with bathing/dressing/bathroom   Can travel by private vehicle     Yes  Equipment Recommendations  None recommended by PT    Recommendations for Other Services       Precautions / Restrictions Precautions Precautions: Fall Restrictions Weight Bearing Restrictions Per Provider Order: No     Mobility  Bed Mobility Overal bed mobility: Modified Independent                  Transfers Overall transfer level: Needs assistance Equipment used: Rolling walker (2 wheels) Transfers: Sit to/from Stand Sit to  Stand: Supervision           General transfer comment: minimal cues for safety    Ambulation/Gait Ambulation/Gait assistance: Supervision Gait Distance (Feet): 100 Feet Assistive device: Rolling walker (2 wheels) Gait Pattern/deviations: Step-through pattern       General Gait Details: no L LE instabiltiy noted, pt required minimal cues for safety, posture and RW management   Stairs Stairs: Yes Stairs assistance: Supervision Stair Management: One rail Left Number of Stairs: 4 General stair comments: min cues, step to pattern with one handrail and intermittent B UE support on L handrial   Wheelchair Mobility     Tilt Bed    Modified Rankin (Stroke Patients Only)       Balance Overall balance assessment: Needs assistance Sitting-balance support: Feet supported Sitting balance-Leahy Scale: Good     Standing balance support: Reliant on assistive device for balance, During functional activity, Bilateral upper extremity supported Standing balance-Leahy Scale: Fair Standing balance comment: static standing no UE support                            Communication Communication Communication: Impaired Factors Affecting Communication: Hearing impaired (HOH)  Cognition Arousal: Alert Behavior During Therapy: WFL for tasks assessed/performed   PT - Cognitive impairments: No apparent impairments                         Following commands: Intact      Cueing    Exercises  General Comments        Pertinent Vitals/Pain Pain Assessment Pain Assessment: 0-10 Pain Score: 5  Pain Location: B feet secondary to callous Pain Descriptors / Indicators: Sore, Grimacing, Discomfort Pain Intervention(s): Limited activity within patient's tolerance, Monitored during session    Home Living                          Prior Function            PT Goals (current goals can now be found in the care plan section) Acute Rehab PT  Goals Patient Stated Goal: return home PT Goal Formulation: With patient Time For Goal Achievement: 05/08/24 Potential to Achieve Goals: Good Progress towards PT goals: Progressing toward goals    Frequency    Min 3X/week      PT Plan      Co-evaluation              AM-PAC PT "6 Clicks" Mobility   Outcome Measure  Help needed turning from your back to your side while in a flat bed without using bedrails?: None Help needed moving from lying on your back to sitting on the side of a flat bed without using bedrails?: None Help needed moving to and from a bed to a chair (including a wheelchair)?: None Help needed standing up from a chair using your arms (e.g., wheelchair or bedside chair)?: A Little Help needed to walk in hospital room?: A Little Help needed climbing 3-5 steps with a railing? : A Little 6 Click Score: 21    End of Session Equipment Utilized During Treatment: Gait belt Activity Tolerance: Patient tolerated treatment well Patient left: in bed;with call bell/phone within reach Nurse Communication: Mobility status PT Visit Diagnosis: Difficulty in walking, not elsewhere classified (R26.2) Pain - Right/Left: Left Pain - part of body: Knee     Time: 1026-1039 PT Time Calculation (min) (ACUTE ONLY): 13 min  Charges:    $Gait Training: 8-22 mins PT General Charges $$ ACUTE PT VISIT: 1 Visit                     Cary Clarks, PT Acute Rehab    Annalee Kiang 04/27/2024, 10:48 AM

## 2024-04-27 NOTE — Plan of Care (Signed)
  Problem: Clinical Measurements: Goal: Will remain free from infection Outcome: Progressing   Problem: Nutrition: Goal: Adequate nutrition will be maintained Outcome: Progressing   Problem: Elimination: Goal: Will not experience complications related to bowel motility Outcome: Progressing Goal: Will not experience complications related to urinary retention Outcome: Progressing   Problem: Pain Managment: Goal: General experience of comfort will improve and/or be controlled Outcome: Progressing   Problem: Safety: Goal: Ability to remain free from injury will improve Outcome: Progressing   Problem: Skin Integrity: Goal: Risk for impaired skin integrity will decrease Outcome: Progressing

## 2024-05-20 ENCOUNTER — Ambulatory Visit: Payer: Self-pay | Admitting: *Deleted

## 2024-07-27 ENCOUNTER — Emergency Department (HOSPITAL_COMMUNITY)

## 2024-07-27 ENCOUNTER — Emergency Department (HOSPITAL_COMMUNITY)
Admission: EM | Admit: 2024-07-27 | Discharge: 2024-07-27 | Disposition: A | Discharge status: Home / Self Care | Attending: Emergency Medicine | Admitting: Emergency Medicine

## 2024-07-27 DIAGNOSIS — E1122 Type 2 diabetes mellitus with diabetic chronic kidney disease: Secondary | ICD-10-CM | POA: Diagnosis not present

## 2024-07-27 DIAGNOSIS — Z7982 Long term (current) use of aspirin: Secondary | ICD-10-CM | POA: Insufficient documentation

## 2024-07-27 DIAGNOSIS — R55 Syncope and collapse: Secondary | ICD-10-CM | POA: Insufficient documentation

## 2024-07-27 DIAGNOSIS — Z794 Long term (current) use of insulin: Secondary | ICD-10-CM | POA: Diagnosis not present

## 2024-07-27 DIAGNOSIS — S0101XA Laceration without foreign body of scalp, initial encounter: Secondary | ICD-10-CM | POA: Insufficient documentation

## 2024-07-27 DIAGNOSIS — M25562 Pain in left knee: Secondary | ICD-10-CM | POA: Diagnosis not present

## 2024-07-27 DIAGNOSIS — G8929 Other chronic pain: Secondary | ICD-10-CM | POA: Diagnosis not present

## 2024-07-27 DIAGNOSIS — Z8673 Personal history of transient ischemic attack (TIA), and cerebral infarction without residual deficits: Secondary | ICD-10-CM | POA: Insufficient documentation

## 2024-07-27 DIAGNOSIS — I129 Hypertensive chronic kidney disease with stage 1 through stage 4 chronic kidney disease, or unspecified chronic kidney disease: Secondary | ICD-10-CM | POA: Insufficient documentation

## 2024-07-27 DIAGNOSIS — E860 Dehydration: Secondary | ICD-10-CM | POA: Insufficient documentation

## 2024-07-27 DIAGNOSIS — W01190A Fall on same level from slipping, tripping and stumbling with subsequent striking against furniture, initial encounter: Secondary | ICD-10-CM | POA: Diagnosis not present

## 2024-07-27 DIAGNOSIS — Z8546 Personal history of malignant neoplasm of prostate: Secondary | ICD-10-CM | POA: Insufficient documentation

## 2024-07-27 DIAGNOSIS — R519 Headache, unspecified: Secondary | ICD-10-CM | POA: Diagnosis present

## 2024-07-27 DIAGNOSIS — N189 Chronic kidney disease, unspecified: Secondary | ICD-10-CM | POA: Insufficient documentation

## 2024-07-27 DIAGNOSIS — M25551 Pain in right hip: Secondary | ICD-10-CM | POA: Diagnosis not present

## 2024-07-27 DIAGNOSIS — Z79899 Other long term (current) drug therapy: Secondary | ICD-10-CM | POA: Diagnosis not present

## 2024-07-27 LAB — CBC
HCT: 32.4 % — ABNORMAL LOW (ref 39.0–52.0)
Hemoglobin: 10.2 g/dL — ABNORMAL LOW (ref 13.0–17.0)
MCH: 27.6 pg (ref 26.0–34.0)
MCHC: 31.5 g/dL (ref 30.0–36.0)
MCV: 87.6 fL (ref 80.0–100.0)
Platelets: 201 K/uL (ref 150–400)
RBC: 3.7 MIL/uL — ABNORMAL LOW (ref 4.22–5.81)
RDW: 13.7 % (ref 11.5–15.5)
WBC: 9.1 K/uL (ref 4.0–10.5)
nRBC: 0 % (ref 0.0–0.2)

## 2024-07-27 LAB — URINALYSIS, ROUTINE W REFLEX MICROSCOPIC
Bilirubin Urine: NEGATIVE
Glucose, UA: NEGATIVE mg/dL
Hgb urine dipstick: NEGATIVE
Ketones, ur: 5 mg/dL — AB
Leukocytes,Ua: NEGATIVE
Nitrite: NEGATIVE
Protein, ur: NEGATIVE mg/dL
Specific Gravity, Urine: 1.018 (ref 1.005–1.030)
pH: 5 (ref 5.0–8.0)

## 2024-07-27 LAB — COMPREHENSIVE METABOLIC PANEL WITH GFR
ALT: 15 U/L (ref 0–44)
AST: 22 U/L (ref 15–41)
Albumin: 3.8 g/dL (ref 3.5–5.0)
Alkaline Phosphatase: 138 U/L — ABNORMAL HIGH (ref 38–126)
Anion gap: 14 (ref 5–15)
BUN: 19 mg/dL (ref 8–23)
CO2: 22 mmol/L (ref 22–32)
Calcium: 9.4 mg/dL (ref 8.9–10.3)
Chloride: 104 mmol/L (ref 98–111)
Creatinine, Ser: 1.74 mg/dL — ABNORMAL HIGH (ref 0.61–1.24)
GFR, Estimated: 39 mL/min — ABNORMAL LOW (ref >60)
Glucose, Bld: 174 mg/dL — ABNORMAL HIGH (ref 70–99)
Potassium: 4.5 mmol/L (ref 3.5–5.1)
Sodium: 140 mmol/L (ref 135–145)
Total Bilirubin: 0.5 mg/dL (ref 0.0–1.2)
Total Protein: 7.1 g/dL (ref 6.5–8.1)

## 2024-07-27 LAB — TROPONIN T, HIGH SENSITIVITY
Troponin T High Sensitivity: 38 ng/L — ABNORMAL HIGH (ref 0–19)
Troponin T High Sensitivity: 32 ng/L — ABNORMAL HIGH (ref 0–19)

## 2024-07-27 LAB — MAGNESIUM: Magnesium: 2 mg/dL (ref 1.7–2.4)

## 2024-07-27 MED ORDER — LIDOCAINE-EPINEPHRINE (PF) 2 %-1:200000 IJ SOLN
10.0000 mL | Freq: Once | INTRAMUSCULAR | Status: DC
  Filled 2024-07-27: qty 20

## 2024-07-27 MED ORDER — ACETAMINOPHEN 325 MG PO TABS
650.0000 mg | ORAL_TABLET | Freq: Once | ORAL | Status: AC
  Administered 2024-07-27: 650 mg via ORAL
  Filled 2024-07-27: qty 2

## 2024-07-27 MED ORDER — LIDOCAINE-EPINEPHRINE-TETRACAINE (LET) TOPICAL GEL
3.0000 mL | Freq: Once | TOPICAL | Status: AC
  Administered 2024-07-27: 3 mL via TOPICAL
  Filled 2024-07-27: qty 3

## 2024-07-27 MED ORDER — LACTATED RINGERS IV BOLUS
1000.0000 mL | Freq: Once | INTRAVENOUS | Status: AC
  Administered 2024-07-27: 1000 mL via INTRAVENOUS

## 2024-07-27 NOTE — ED Triage Notes (Signed)
 Patient states he was walking to the bathroom and he fell. Patient states he hit his head and right side of his head is hurting. Patient has been having passing out spells and has been feeling lightheaded. Patient has a laceration to right side of head. Patient rates his pain 5/10. Patient also complaining of pain in right hip area.

## 2024-07-27 NOTE — ED Notes (Signed)
 Pt laceration was cleaned w/saline. No pain around the area, however he c/o slight pain when the actual laceration was touched.

## 2024-07-27 NOTE — ED Provider Notes (Signed)
 [facility] AT [facility] Provider Note   CSN: [id number] Arrival date & time: 07/27/24  1448     Patient presents with: No chief complaint on file.   [name] is a 78 y.o. male.   HPI Patient presents after a fall.  Medical history includes DM, HTN, prostate cancer, anemia, CKD, CVA, OSA, anxiety, arthritis, agent orange exposure.  Several years ago, he had radioactive bead placement for treatment of prostate cancer.  He subsequently had a complication requiring anal drain.  Anal area has remained in place since that time.  Initial plan was for subsequent surgeries, however, surgical team did not feel that benefit outweighed risk.  He states that he will have intermittent diarrhea.  He has had recent worsening of his diarrhea, described as watery and nonbloody.  He has approximately 3-4 episodes a day.  He has a poor appetite and poor p.o. intake.  He remains on Lasix.  He has had worsening generalized weakness and fatigue lately.  He will ambulate with a walker but distances are limited due to fatigue.  He will experience intermittent episodes of lightheadedness.  Today, he was walking to the bathroom.  He had a syncopal episode with fall.  He did strike the right side of his head on a piece of furniture.  He has since had pain in his head, right hip, and left knee.  He states that the left knee pain is chronic.    Prior to Admission medications   Medication Sig Start Date End Date Taking? Authorizing Provider  acetaminophen  (TYLENOL ) 500 MG tablet Take 1,000 mg by mouth every 6 (six) hours as needed for moderate pain.    [provider]  amitriptyline  (ELAVIL ) 50 MG tablet Take 50 mg by mouth at bedtime.  10/06/11   [provider]  amLODipine  (NORVASC ) 10 MG tablet Take 10 mg by mouth at bedtime.    [provider]  aspirin  EC 81 MG tablet Take 162 mg by mouth daily. Swallow whole.    [provider]  atorvastatin   (LIPITOR) 20 MG tablet Take 20 mg by mouth at bedtime.    [provider]  buPROPion  (WELLBUTRIN  XL) 150 MG 24 hr tablet Take 150 mg by mouth daily.    [provider]  camphor-menthol  VIKKI) lotion Apply 1 application topically daily. 09/05/20   [provider]  Carboxymethylcellulose Sod PF 0.5 % SOLN Place 1 drop into both eyes in the morning, at noon, in the evening, and at bedtime.    [provider]  carvedilol  (COREG ) 12.5 MG tablet Take 12.5 mg by mouth 2 (two) times daily with a meal.    [provider]  colchicine  0.6 MG tablet Take 0.6 mg by mouth daily as needed (gout).    [provider]  febuxostat  (ULORIC ) 40 MG tablet Take 40 mg by mouth at bedtime.    [provider]  ferrous sulfate  (FERROUSUL) 325 (65 FE) MG tablet Take 1 tablet (325 mg total) by mouth 3 (three) times daily with meals. Patient taking differently: Take 325 mg by mouth daily. 01/22/17   Danella Cough, PA-C  furosemide (LASIX) 20 MG tablet Take 20 mg by mouth daily.    [provider]  gabapentin  (NEURONTIN ) 300 MG capsule Take 300-600 mg by mouth 2 (two) times daily. Take 2 capsules (600 mg) in the morning and Take 1 capsule (300 mg) at bedtime    [provider]  hydrocortisone  (ANUSOL -HC) 25  MG suppository Place 1 suppository (25 mg total) rectally 2 (two) times daily. Patient taking differently: Place 25 mg rectally 2 (two) times daily as needed for hemorrhoids. 12/15/20   Bruning, Ashlyn, PA-C  hydrocortisone  2.5 % cream Apply 1 application topically at bedtime.    [provider]  insulin  glargine (LANTUS ) 100 UNIT/ML injection Inject 0.15 mLs (15 Units total) into the skin at bedtime. Patient taking differently: Inject 35 Units into the skin at bedtime. 04/11/21   Shona Terry SAILOR, DO  neomycin-bacitracin-polymyxin (NEOSPORIN) ointment Apply 1 application topically daily as needed for wound care.    [provider]   nitroGLYCERIN (NITROSTAT) 0.4 MG SL tablet Place 0.4 mg under the tongue every 5 (five) minutes as needed for chest pain.    [provider]  polycarbophil (FIBERCON) 625 MG tablet Take 1 tablet (625 mg total) by mouth daily. 04/05/21   Vicci Burnard SAUNDERS, PA-C  Pyridoxine HCl (VITAMIN B-6 PO) Take 2 tablets by mouth daily.     [provider]  tamsulosin  (FLOMAX ) 0.4 MG CAPS capsule Take 1 capsule (0.4 mg total) by mouth daily after supper. 12/15/20   Bruning, Ashlyn, PA-C  tetrahydrozoline 0.05 % ophthalmic solution Place 3 drops into both eyes daily as needed. 01/24/17   [provider]  vitamin B-12 (CYANOCOBALAMIN ) 1000 MCG tablet Take 1,000 mcg by mouth daily.    [provider]    Allergies: Patient has no known allergies.    Review of Systems  Constitutional:  Positive for activity change, appetite change, fatigue and unexpected weight change.  Gastrointestinal:  Positive for diarrhea.  Musculoskeletal:  Positive for arthralgias.  Skin:  Positive for wound.  Neurological:  Positive for syncope, weakness (Generalized), light-headedness and headaches.  All other systems reviewed and are negative.   Updated Vital Signs BP (!) 126/58 (BP Location: Right Arm)   Pulse 62   Temp 97.8 F (36.6 C) (Oral)   Resp 16   SpO2 99%   Physical Exam Vitals and nursing note reviewed.  Constitutional:      General: He is not in acute distress.    Appearance: Normal appearance. He is well-developed. He is not ill-appearing, toxic-appearing or diaphoretic.  HENT:     Head: Normocephalic.     Comments: Hemostatic wound to right parietal scalp    Right Ear: External ear normal.     Left Ear: External ear normal.     Nose: Nose normal.     Mouth/Throat:     Mouth: Mucous membranes are moist.  Eyes:     Extraocular Movements: Extraocular movements intact.     Conjunctiva/sclera: Conjunctivae normal.  Cardiovascular:     Rate and Rhythm: Normal rate and  regular rhythm.     Heart sounds: No murmur heard. Pulmonary:     Effort: Pulmonary effort is normal. No respiratory distress.     Breath sounds: Normal breath sounds. No wheezing or rales.  Chest:     Chest wall: No tenderness.  Abdominal:     General: There is no distension.     Palpations: Abdomen is soft.     Tenderness: There is no abdominal tenderness.  Musculoskeletal:        General: No swelling or deformity.     Cervical back: Normal range of motion and neck supple.     Right lower leg: No edema.     Left lower leg: No edema.  Skin:    General: Skin is warm and dry.  Coloration: Skin is not jaundiced or pale.  Neurological:     General: No focal deficit present.     Mental Status: He is alert and oriented to person, place, and time.     Cranial Nerves: No cranial nerve deficit.     Sensory: No sensory deficit.     Motor: No weakness.     Coordination: Coordination normal.  Psychiatric:        Mood and Affect: Mood normal.        Behavior: Behavior normal.     (all labs ordered are listed, but only abnormal results are displayed) Labs Reviewed  COMPREHENSIVE METABOLIC PANEL WITH GFR - Abnormal; Notable for the following components:      Result Value   Glucose, Bld 174 (*)    Creatinine, Ser 1.74 (*)    Alkaline Phosphatase 138 (*)    GFR, Estimated 39 (*)    All other components within normal limits  CBC - Abnormal; Notable for the following components:   RBC 3.70 (*)    Hemoglobin 10.2 (*)    HCT 32.4 (*)    All other components within normal limits  URINALYSIS, ROUTINE W REFLEX MICROSCOPIC - Abnormal; Notable for the following components:   APPearance HAZY (*)    Ketones, ur 5 (*)    All other components within normal limits  TROPONIN T, HIGH SENSITIVITY - Abnormal; Notable for the following components:   Troponin T High Sensitivity 38 (*)    All other components within normal limits  TROPONIN T, HIGH SENSITIVITY - Abnormal; Notable for the following  components:   Troponin T High Sensitivity 32 (*)    All other components within normal limits  MAGNESIUM     EKG: EKG Interpretation Date/Time:  Monday July 27 2024 17:33:19 EDT Ventricular Rate:  67 PR Interval:  198 QRS Duration:  145 QT Interval:  437 QTC Calculation: 462 R Axis:   -28  Text Interpretation: Sinus rhythm Atrial premature complex Right bundle branch block Confirmed by Melvenia Motto [phone]) on 07/27/2024 5:52:29 PM  Radiology: [facility] Knee Complete 4 Views Left Result Date: 07/27/2024 CLINICAL DATA:  Status post fall. EXAM: LEFT KNEE - COMPLETE 4+ VIEW COMPARISON:  April 23, 2024 FINDINGS: No evidence of an acute fracture, dislocation, or joint effusion. Marked severity tricompartmental degenerative changes are seen. Additional chronic changes are seen along the tibial tuberosity. Moderate severity vascular calcification is noted. Soft tissues are unremarkable. IMPRESSION: Marked severity tricompartmental degenerative changes. Electronically Signed   By: Suzen Dials M.D.   On: 07/27/2024 17:36   DG Pelvis Portable Result Date: 07/27/2024 CLINICAL DATA:  Status post fall. EXAM: PORTABLE PELVIS 1-2 VIEWS COMPARISON:  None Available. FINDINGS: An intact total right hip replacement is seen. There is no evidence of an acute pelvic fracture or diastasis. No pelvic bone lesions are seen. Multiple radiopaque prostate radiation implantation seeds are noted. IMPRESSION: 1. Intact total right hip replacement. 2. No acute fracture or diastasis. Electronically Signed   By: Suzen Dials M.D.   On: 07/27/2024 17:34   DG Chest Port 1 View Result Date: 07/27/2024 CLINICAL DATA:  Status post fall. EXAM: PORTABLE CHEST 1 VIEW COMPARISON:  Mar 31, 2021 FINDINGS: The heart size and mediastinal contours are within normal limits. Low lung volumes are noted with mild, stable elevation of the right hemidiaphragm. Very mild linear scarring and/or atelectasis is seen within the left lung base. No  acute infiltrate, pleural effusion or pneumothorax is identified. The visualized skeletal structures are  unremarkable. IMPRESSION: Low lung volumes with very mild left basilar linear scarring and/or atelectasis. Electronically Signed   By: Suzen Dials M.D.   On: 07/27/2024 17:33   CT CHEST ABDOMEN PELVIS WO CONTRAST Result Date: 07/27/2024 CLINICAL DATA:  Polytrauma, blunt, fall EXAM: CT CHEST, ABDOMEN AND PELVIS WITHOUT CONTRAST TECHNIQUE: Multidetector CT imaging of the chest, abdomen and pelvis was performed following the standard protocol without IV contrast. RADIATION DOSE REDUCTION: This exam was performed according to the departmental dose-optimization program which includes automated exposure control, adjustment of the mA and/or kV according to patient size and/or use of iterative reconstruction technique. COMPARISON:  April 23, 2024 FINDINGS: Of note, the lack of intravenous contrast limits evaluation of the solid organ parenchyma and vascularity. CT CHEST FINDINGS Cardiovascular: No cardiomegaly or pericardial effusion.No aortic aneurysm. Diffuse aortic and dense multi-vessel coronary atherosclerosis. Mediastinum/Nodes: No mediastinal mass.No mediastinal, hilar, or axillary lymphadenopathy. Lungs/Pleura: The midline trachea and bronchi are patent. Similar subsegmental atelectasis in the lingula and both lower lobes. No focal airspace consolidation, pleural effusion, or pneumothorax. CT ABDOMEN PELVIS FINDINGS Hepatobiliary: No mass.Multiple radiopaque gallstones. No wall thickening. No intrahepatic or extrahepatic biliary ductal dilation. Pancreas: No mass or main ductal dilation. No peripancreatic inflammation or fluid collection. Spleen: Normal size. No mass. Adrenals/Urinary Tract: No adrenal masses. No renal mass. No hydronephrosis or nephrolithiasis. The urinary bladder is decompressed and obscured by metallic streak artifact from orthopedic hardware. No focal abnormality visualized.  Stomach/Bowel: The stomach is decompressed without focal abnormality. No small bowel wall thickening or inflammation. No small bowel obstruction.The appendix was not visualized. No right lower quadrant or pericecal inflammatory changes to suggest acute appendicitis. Sigmoid colonic diverticulosis. No changes of acute diverticulitis. Similarly positioned seton loop within a left perianal fistula. Vascular/Lymphatic: No aortic aneurysm. Scattered aortoiliac atherosclerosis. No intraabdominal or pelvic lymphadenopathy. Reproductive: Brachytherapy seeds filling the prostate gland. No free pelvic fluid. Other: No pneumoperitoneum, ascites, or mesenteric inflammation. Musculoskeletal: No acute fracture or destructive lesion. Right hip arthroplasty is anatomically aligned without dislocation. Multilevel degenerative disc disease of the spine. Thoracic DISH. Moderate left hip osteoarthritis. Similar appearance of the 1.9 cm subcutaneous nodule in the medial left gluteal fold (axial 129). IMPRESSION: 1. No acute, traumatic injury within the chest, abdomen, and pelvis. 2. Similar appearance of the subcutaneous nodule in the medial left gluteal fold (axial 129), measuring 1.9 cm. 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 3. Cholecystolithiasis.  No changes of acute cholecystitis. 4. Sigmoid diverticulosis. Aortic Atherosclerosis (ICD10-I70.0). Electronically Signed   By: Rogelia Myers M.D.   On: 07/27/2024 17:18   CT HEAD WO CONTRAST Result Date: 07/27/2024 CLINICAL DATA:  Fall hit head EXAM: CT HEAD WITHOUT CONTRAST CT CERVICAL SPINE WITHOUT CONTRAST TECHNIQUE: Multidetector CT imaging of the head and cervical spine was performed following the standard protocol without intravenous contrast. Multiplanar CT image reconstructions of the cervical spine were also generated. RADIATION DOSE REDUCTION: This exam was performed according to the departmental dose-optimization program which  includes automated exposure control, adjustment of the mA and/or kV according to patient size and/or use of iterative reconstruction technique. COMPARISON:  CT brain and cervical spine 04/23/2024 FINDINGS: CT HEAD FINDINGS Brain: No acute territorial infarction, hemorrhage or intracranial mass. Chronic lacunar infarct within the left caudate. Atrophy and mild chronic small vessel ischemic changes of the white matter. Stable ventricle size. Vascular: No hyperdense vessels.  Carotid vascular calcification Skull: Normal. Negative for fracture or focal lesion. Sinuses/Orbits: Mild mucosal thickening in the sinuses Other:  None CT CERVICAL SPINE FINDINGS Alignment: No subluxation. Straightening of the cervical spine. Facet alignment is normal Skull base and vertebrae: No acute fracture. No primary bone lesion or focal pathologic process. Soft tissues and spinal canal: No prevertebral fluid or swelling. No visible canal hematoma. Disc levels: Mild disc space narrowing C4-C5, and C6-C7. Multilevel degenerative osteophyte. Facet degenerative change at multiple levels with foraminal narrowing. Upper chest: Lung apices are clear. 14 mm left thyroid nodule, no imaging follow-up is recommended Other: None IMPRESSION: 1. No CT evidence for acute intracranial abnormality. Atrophy and chronic small vessel ischemic changes of the white matter. 2. Straightening of the cervical spine with degenerative changes. No acute osseous abnormality. Electronically Signed   By: Luke Bun M.D.   On: 07/27/2024 17:02   CT CERVICAL SPINE WO CONTRAST Result Date: 07/27/2024 CLINICAL DATA:  Fall hit head EXAM: CT HEAD WITHOUT CONTRAST CT CERVICAL SPINE WITHOUT CONTRAST TECHNIQUE: Multidetector CT imaging of the head and cervical spine was performed following the standard protocol without intravenous contrast. Multiplanar CT image reconstructions of the cervical spine were also generated. RADIATION DOSE REDUCTION: This exam was performed  according to the departmental dose-optimization program which includes automated exposure control, adjustment of the mA and/or kV according to patient size and/or use of iterative reconstruction technique. COMPARISON:  CT brain and cervical spine 04/23/2024 FINDINGS: CT HEAD FINDINGS Brain: No acute territorial infarction, hemorrhage or intracranial mass. Chronic lacunar infarct within the left caudate. Atrophy and mild chronic small vessel ischemic changes of the white matter. Stable ventricle size. Vascular: No hyperdense vessels.  Carotid vascular calcification Skull: Normal. Negative for fracture or focal lesion. Sinuses/Orbits: Mild mucosal thickening in the sinuses Other: None CT CERVICAL SPINE FINDINGS Alignment: No subluxation. Straightening of the cervical spine. Facet alignment is normal Skull base and vertebrae: No acute fracture. No primary bone lesion or focal pathologic process. Soft tissues and spinal canal: No prevertebral fluid or swelling. No visible canal hematoma. Disc levels: Mild disc space narrowing C4-C5, and C6-C7. Multilevel degenerative osteophyte. Facet degenerative change at multiple levels with foraminal narrowing. Upper chest: Lung apices are clear. 14 mm left thyroid nodule, no imaging follow-up is recommended Other: None IMPRESSION: 1. No CT evidence for acute intracranial abnormality. Atrophy and chronic small vessel ischemic changes of the white matter. 2. Straightening of the cervical spine with degenerative changes. No acute osseous abnormality. Electronically Signed   By: Luke Bun M.D.   On: 07/27/2024 17:02     .Laceration Repair  Date/Time: 07/27/2024 10:00 PM  Performed by: Melvenia Motto, MD Authorized by: Melvenia Motto, MD   Consent:    Consent obtained:  Verbal   Consent given by:  Patient   Risks, benefits, and alternatives were discussed: yes     Risks discussed:  Infection, poor cosmetic result and pain Universal protocol:    Procedure explained and  questions answered to patient or proxy's satisfaction: yes     Patient identity confirmed:  Verbally with patient Anesthesia:    Anesthesia method:  Topical application and local infiltration   Topical anesthetic:  LET   Local anesthetic:  Lidocaine  2% WITH epi Laceration details:    Location:  Scalp   Scalp location:  R parietal   Length (cm):  3   Depth (mm):  4 Pre-procedure details:    Preparation:  Imaging obtained to evaluate for foreign bodies Exploration:    Imaging outcome: foreign body not noted     Wound exploration: wound explored through full range  of motion and entire depth of wound visualized     Contaminated: no   Treatment:    Area cleansed with:  Saline   Amount of cleaning:  Standard   Irrigation solution:  Sterile saline   Irrigation volume:  50cc   Irrigation method:  Syringe   Visualized foreign bodies/material removed: no   Skin repair:    Repair method:  Staples   Number of staples:  6 Approximation:    Approximation:  Close Repair type:    Repair type:  Simple Post-procedure details:    Dressing:  Open (no dressing)   Procedure completion:  Tolerated well, no immediate complications    Medications Ordered in the ED  lidocaine -EPINEPHrine  (XYLOCAINE  W/EPI) 2 %-1:200000 (PF) injection 10 mL (has no administration in time range)  lidocaine -EPINEPHrine -tetracaine  (LET) topical gel (3 mLs Topical Given 07/27/24 1832)  acetaminophen  (TYLENOL ) tablet 650 mg (650 mg Oral Given 07/27/24 1828)  lactated ringers  bolus 1,000 mL (0 mLs Intravenous Stopped 07/27/24 1904)                                    Medical Decision Making Amount and/or Complexity of Data Reviewed Labs: ordered. Radiology: ordered.  Risk OTC drugs. Prescription drug management.   This patient presents to the ED for concern of syncope and fall, this involves an extensive number of treatment options, and is a complaint that carries with it a high risk of complications and morbidity.   The differential diagnosis includes arrhythmia, vasovagal episode, dehydration, anemia, metabolic derangements, acute injury   Co morbidities / Chronic conditions that complicate the patient evaluation  DM, HTN, prostate cancer, anemia, CKD, CVA, OSA, anxiety, arthritis, agent orange exposure   Additional history obtained:  Additional history obtained from EMR External records from outside source obtained and reviewed including patient's wife   Lab Tests:  I Ordered, and personally interpreted labs.  The pertinent results include: Baseline anemia, baseline kidney function, normal electrolytes, no leukocytosis, slight elevation in troponin but stable on repeat.   Imaging Studies ordered:  I ordered imaging studies including x-ray of chest, pelvis, left knee; CT of head, cervical spine, chest, abdomen, pelvis I independently visualized and interpreted imaging which showed no acute findings I agree with the radiologist interpretation   Cardiac Monitoring: / EKG:  The patient was maintained on a cardiac monitor.  I personally viewed and interpreted the cardiac monitored which showed an underlying rhythm of: Sinus rhythm   Problem List / ED Course / Critical interventions / Medication management  Patient presenting after syncopal episode and fall today.  On arrival in the ED, vital signs notable for slight bradycardia with soft blood pressures.  He does take blood pressure medications at home.  He has had recent fluid losses from diarrhea.  He does continue to take Lasix.  On exam, he is overall well-appearing.  He has a hemostatic wound to his right parietal scalp.  He endorses some new right hip pain but has been ambulatory since his fall.  He endorses some chronic left knee pain which is only slightly worsened from baseline.  Workup was initiated to identify possible acute injuries.  Additionally, patient has had recent progression of generalized weakness and fatigue in the setting of  increased fluid losses and decreased p.o. intake.  Laboratory workup initiated as well.  IV fluids were ordered for hydration.  Tylenol  ordered for analgesia.  Patient's lab work was  notable for slight elevation in troponin with no priors for comparison.  His creatinine is baseline.  He felt better after 1 L of IV fluid hydration.  He was able to stand without any new or syncopal symptoms.  His blood pressure has remained normal while in the ED.  Imaging studies did not show any acute injuries and did not show any acute intra-abdominal findings.  Laceration was cleansed and repaired, as per procedure note above.  Patient remained in normal sinus rhythm while in the ED.  Repeat troponin did not show any further increase.  Patient was discharged in stable condition. I ordered medication including IV fluids for hydration; Tylenol  for analgesia Reevaluation of the patient after these medicines showed that the patient improved I have reviewed the patients home medicines and have made adjustments as needed   Social Determinants of Health:  Lives at home with wife     Final diagnoses:  Syncope and collapse  Laceration of scalp, initial encounter  Dehydration    ED Discharge Orders     None          Melvenia Motto, MD 07/27/24 2305

## 2024-07-27 NOTE — Discharge Instructions (Signed)
 Staples can be removed in 7 days.  If area becomes increasingly red, swollen, painful, or drains pus, return to the emergency department.  Your test results today are reassuring.  I suspect that you have dehydration from ongoing diarrhea in addition to your diuretic medication.  Continue to drink fluids at home to maintain adequate hydration.  If you do have multiple episodes of diarrhea in a day, you can hold off on your dose of Lasix.  Follow-up with your outpatient providers.  Return to the emergency department for any new or worsening symptoms of concern.

## 2024-09-23 ENCOUNTER — Encounter (HOSPITAL_COMMUNITY): Payer: Self-pay

## 2024-09-23 ENCOUNTER — Other Ambulatory Visit: Payer: Self-pay

## 2024-09-23 ENCOUNTER — Emergency Department (HOSPITAL_COMMUNITY)
Admission: EM | Admit: 2024-09-23 | Discharge: 2024-09-23 | Disposition: A | Discharge status: Home / Self Care | Source: Ambulatory Visit | Attending: Emergency Medicine | Admitting: Emergency Medicine

## 2024-09-23 DIAGNOSIS — Z7982 Long term (current) use of aspirin: Secondary | ICD-10-CM | POA: Diagnosis not present

## 2024-09-23 DIAGNOSIS — E860 Dehydration: Secondary | ICD-10-CM | POA: Diagnosis not present

## 2024-09-23 DIAGNOSIS — Z794 Long term (current) use of insulin: Secondary | ICD-10-CM | POA: Insufficient documentation

## 2024-09-23 DIAGNOSIS — R799 Abnormal finding of blood chemistry, unspecified: Secondary | ICD-10-CM | POA: Diagnosis present

## 2024-09-23 LAB — COMPREHENSIVE METABOLIC PANEL WITH GFR
ALT: 7 U/L (ref 0–44)
AST: 14 U/L — ABNORMAL LOW (ref 15–41)
Albumin: 3.7 g/dL (ref 3.5–5.0)
Alkaline Phosphatase: 143 U/L — ABNORMAL HIGH (ref 38–126)
Anion gap: 14 (ref 5–15)
BUN: 19 mg/dL (ref 8–23)
CO2: 19 mmol/L — ABNORMAL LOW (ref 22–32)
Calcium: 9.2 mg/dL (ref 8.9–10.3)
Chloride: 108 mmol/L (ref 98–111)
Creatinine, Ser: 2.12 mg/dL — ABNORMAL HIGH (ref 0.61–1.24)
GFR, Estimated: 31 mL/min — ABNORMAL LOW (ref >60)
Glucose, Bld: 175 mg/dL — ABNORMAL HIGH (ref 70–99)
Potassium: 3.8 mmol/L (ref 3.5–5.1)
Sodium: 140 mmol/L (ref 135–145)
Total Bilirubin: 0.3 mg/dL (ref 0.0–1.2)
Total Protein: 7.1 g/dL (ref 6.5–8.1)

## 2024-09-23 LAB — CBC WITH DIFFERENTIAL/PLATELET
Abs Immature Granulocytes: 0.11 K/uL — ABNORMAL HIGH (ref 0.00–0.07)
Basophils Absolute: 0 K/uL (ref 0.0–0.1)
Basophils Relative: 0 %
Eosinophils Absolute: 0.6 K/uL — ABNORMAL HIGH (ref 0.0–0.5)
Eosinophils Relative: 6 %
HCT: 30.6 % — ABNORMAL LOW (ref 39.0–52.0)
Hemoglobin: 9.9 g/dL — ABNORMAL LOW (ref 13.0–17.0)
Immature Granulocytes: 1 %
Lymphocytes Relative: 25 %
Lymphs Abs: 2.3 K/uL (ref 0.7–4.0)
MCH: 27.6 pg (ref 26.0–34.0)
MCHC: 32.4 g/dL (ref 30.0–36.0)
MCV: 85.2 fL (ref 80.0–100.0)
Monocytes Absolute: 0.5 K/uL (ref 0.1–1.0)
Monocytes Relative: 6 %
Neutro Abs: 5.6 K/uL (ref 1.7–7.7)
Neutrophils Relative %: 62 %
Platelets: 203 K/uL (ref 150–400)
RBC: 3.59 MIL/uL — ABNORMAL LOW (ref 4.22–5.81)
RDW: 12.6 % (ref 11.5–15.5)
WBC: 9.1 K/uL (ref 4.0–10.5)
nRBC: 0 % (ref 0.0–0.2)

## 2024-09-23 LAB — I-STAT CHEM 8, ED
BUN: 20 mg/dL (ref 8–23)
Calcium, Ion: 1.16 mmol/L (ref 1.15–1.40)
Chloride: 110 mmol/L (ref 98–111)
Creatinine, Ser: 2.2 mg/dL — ABNORMAL HIGH (ref 0.61–1.24)
Glucose, Bld: 173 mg/dL — ABNORMAL HIGH (ref 70–99)
HCT: 28 % — ABNORMAL LOW (ref 39.0–52.0)
Hemoglobin: 9.5 g/dL — ABNORMAL LOW (ref 13.0–17.0)
Potassium: 3.8 mmol/L (ref 3.5–5.1)
Sodium: 142 mmol/L (ref 135–145)
TCO2: 20 mmol/L — ABNORMAL LOW (ref 22–32)

## 2024-09-23 NOTE — ED Triage Notes (Addendum)
 Pt was told to get here right away by the [facility] because of abnormal labs. Pt was not told what was abnormal. Pt reports feeling more fatigued.

## 2024-09-23 NOTE — ED Provider Notes (Signed)
 [place] EMERGENCY DEPARTMENT AT [facility] Provider Note   CSN: [id number] Arrival date & time: 09/23/24  1332     Patient presents with: abnormal labs   [name] is a 78 y.o. male.   78 yo M with a chief complaints of an abnormal lab value.  The patient is not sure actually what lab was abnormal.  He went to the Department of [facility] today and had lab work drawn.  He was then called at home and told to come to the hospital immediately.  He feels like he has been doing well.  Denies chest pain denies difficulty breathing feels like he has been eating and drinking okay.          Prior to Admission medications   Medication Sig Start Date End Date Taking? Authorizing Provider  acetaminophen  (TYLENOL ) 500 MG tablet Take 1,000 mg by mouth every 6 (six) hours as needed for moderate pain.    [provider]  amitriptyline  (ELAVIL ) 50 MG tablet Take 50 mg by mouth at bedtime.  10/06/11   [provider]  amLODipine  (NORVASC ) 10 MG tablet Take 10 mg by mouth at bedtime.    [provider]  aspirin  EC 81 MG tablet Take 162 mg by mouth daily. Swallow whole.    [provider]  atorvastatin  (LIPITOR) 20 MG tablet Take 20 mg by mouth at bedtime.    [provider]  buPROPion  (WELLBUTRIN  XL) 150 MG 24 hr tablet Take 150 mg by mouth daily.    [provider]  camphor-menthol  VIKKI) lotion Apply 1 application topically daily. 09/05/20   [provider]  Carboxymethylcellulose Sod PF 0.5 % SOLN Place 1 drop into both eyes in the morning, at noon, in the evening, and at bedtime.    [provider]  carvedilol  (COREG ) 12.5 MG tablet Take 12.5 mg by mouth 2 (two) times daily with a meal.    [provider]  colchicine  0.6 MG tablet Take 0.6 mg by mouth daily as needed (gout).    [provider]  febuxostat  (ULORIC ) 40 MG tablet Take 40 mg by mouth at bedtime.    [provider]  ferrous sulfate  (FERROUSUL) 325 (65 FE) MG tablet Take 1 tablet (325 mg total) by mouth 3 (three) times daily with meals. Patient taking differently: Take 325 mg by mouth daily. 01/22/17   Danella Cough, PA-C  furosemide (LASIX) 20 MG tablet Take 20 mg by mouth daily.    [provider]  gabapentin  (NEURONTIN ) 300 MG capsule Take 300-600 mg by mouth 2 (two) times daily. Take 2 capsules (600 mg) in the morning and Take 1 capsule (300 mg) at bedtime    [provider]  hydrocortisone  (ANUSOL -HC) 25 MG suppository Place 1 suppository (25 mg total) rectally 2 (two) times daily. Patient taking differently: Place 25 mg rectally 2 (two) times daily as needed for hemorrhoids. 12/15/20   Bruning, Ashlyn, PA-C  hydrocortisone  2.5 % cream Apply 1 application topically at bedtime.    [provider]  insulin  glargine (LANTUS ) 100 UNIT/ML injection Inject 0.15 mLs (15 Units total) into the skin at bedtime. Patient taking differently: Inject 35 Units into the skin at bedtime. 04/11/21   Shona Terry SAILOR, DO  neomycin-bacitracin-polymyxin (NEOSPORIN) ointment Apply 1 application topically daily as needed for wound care.    [provider]  nitroGLYCERIN (NITROSTAT) 0.4 MG SL tablet Place 0.4 mg under the tongue every 5 (five) minutes as  needed for chest pain.    [provider]  polycarbophil (FIBERCON) 625 MG tablet Take 1 tablet (625 mg total) by mouth daily. 04/05/21   Vicci Burnard SAUNDERS, PA-C  Pyridoxine HCl (VITAMIN B-6 PO) Take 2 tablets by mouth daily.     [provider]  tamsulosin  (FLOMAX ) 0.4 MG CAPS capsule Take 1 capsule (0.4 mg total) by mouth daily after supper. 12/15/20   Bruning, Ashlyn, PA-C  tetrahydrozoline 0.05 % ophthalmic solution Place 3 drops into both eyes daily as needed. 01/24/17   [provider]  vitamin B-12 (CYANOCOBALAMIN ) 1000 MCG tablet Take 1,000 mcg by mouth daily.    [provider]    Allergies: Patient has  no known allergies.    Review of Systems  Updated Vital Signs BP (!) 145/81   Pulse 78   Temp 97.8 F (36.6 C) (Oral)   Resp 16   SpO2 100%   Physical Exam Vitals and nursing note reviewed.  Constitutional:      Appearance: He is well-developed.  HENT:     Head: Normocephalic and atraumatic.  Eyes:     Pupils: Pupils are equal, round, and reactive to light.  Neck:     Vascular: No JVD.  Cardiovascular:     Rate and Rhythm: Normal rate and regular rhythm.     Heart sounds: No murmur heard.    No friction rub. No gallop.  Pulmonary:     Effort: No respiratory distress.     Breath sounds: No wheezing.  Abdominal:     General: There is no distension.     Tenderness: There is no abdominal tenderness. There is no guarding or rebound.  Musculoskeletal:        General: Normal range of motion.     Cervical back: Normal range of motion and neck supple.  Skin:    Coloration: Skin is not pale.     Findings: No rash.  Neurological:     Mental Status: He is alert and oriented to person, place, and time.  Psychiatric:        Behavior: Behavior normal.     (all labs ordered are listed, but only abnormal results are displayed) Labs Reviewed  CBC WITH DIFFERENTIAL/PLATELET - Abnormal; Notable for the following components:      Result Value   RBC 3.59 (*)    Hemoglobin 9.9 (*)    HCT 30.6 (*)    Eosinophils Absolute 0.6 (*)    Abs Immature Granulocytes 0.11 (*)    All other components within normal limits  COMPREHENSIVE METABOLIC PANEL WITH GFR - Abnormal; Notable for the following components:   CO2 19 (*)    Glucose, Bld 175 (*)    Creatinine, Ser 2.12 (*)    AST 14 (*)    Alkaline Phosphatase 143 (*)    GFR, Estimated 31 (*)    All other components within normal limits  I-STAT CHEM 8, ED - Abnormal; Notable for the following components:   Creatinine, Ser 2.20 (*)    Glucose, Bld 173 (*)    TCO2 20 (*)    Hemoglobin 9.5 (*)    HCT 28.0 (*)    All other components  within normal limits    EKG: None  Radiology: No results found.   Procedures   Medications Ordered in the ED - No data to display  Medical Decision Making Amount and/or Complexity of Data Reviewed Labs: ordered.   78 yo M with a chief complaints of concern for abnormal lab.  Patient is not sure what lab was abnormal and [facility] and [facility] immediately.  I am able to review his labs.  It looks like he had a CBC and a CMP that were done at the [facility] today.  His hemoglobin appears to be stable otherwise I see no abnormality on the CBC.  His CMP has an elevated creatinine.  His last check in the [facility] back in April was 1.8.  Today was 2.02.  I do not see any other obvious acute abnormality.  His potassium and sodium and calcium  were normal.  Labs were rechecked here, hemoglobin appears stable.  His creatinine is elevated at 2.12.  I discussed results with patient and family.  I offered a bag of IV fluids here.  He is currently declining.  Will have him follow back up with his PCP.  8:35 PM:  I have discussed the diagnosis/risks/treatment options with the patient.  Evaluation and diagnostic testing in the emergency department does not suggest an emergent condition requiring admission or immediate intervention beyond what has been performed at this time.  They will follow up with PCP. We also discussed returning to the ED immediately if new or worsening sx occur. We discussed the sx which are most concerning (e.g., sudden worsening pain, fever, inability to tolerate by mouth) that necessitate immediate return. Medications administered to the patient during their visit and any new prescriptions provided to the patient are listed below.  Medications given during this visit Medications - No data to display   The patient appears reasonably screen and/or stabilized for discharge and I doubt any other medical condition or other [facility] requiring  further screening, evaluation, or treatment in the ED at this time prior to discharge.       Final diagnoses:  Dehydration    ED Discharge Orders     None          Emil Share, DO 09/23/24 2035

## 2024-09-23 NOTE — Discharge Instructions (Signed)
 Eat and drink as well as you can for the next week or so.  Lets have you try and get your labs rechecked in about a week or so.
